# Patient Record
Sex: Male | Born: 1940 | Race: White | Hispanic: No | Smoking: Former smoker
Health system: Southern US, Community
[De-identification: ages and names within clinical notes are randomized; demographics above are authoritative.]

## PROBLEM LIST (undated history)

## (undated) DIAGNOSIS — I10 Essential (primary) hypertension: Secondary | ICD-10-CM

## (undated) DIAGNOSIS — K219 Gastro-esophageal reflux disease without esophagitis: Secondary | ICD-10-CM

## (undated) DIAGNOSIS — I639 Cerebral infarction, unspecified: Secondary | ICD-10-CM

## (undated) DIAGNOSIS — G8191 Hemiplegia, unspecified affecting right dominant side: Secondary | ICD-10-CM

## (undated) DIAGNOSIS — E785 Hyperlipidemia, unspecified: Secondary | ICD-10-CM

## (undated) DIAGNOSIS — E119 Type 2 diabetes mellitus without complications: Secondary | ICD-10-CM

## (undated) DIAGNOSIS — G20 Parkinson's disease: Secondary | ICD-10-CM

## (undated) DIAGNOSIS — G20A1 Parkinson's disease without dyskinesia, without mention of fluctuations: Secondary | ICD-10-CM

## (undated) DIAGNOSIS — Z789 Other specified health status: Secondary | ICD-10-CM

## (undated) HISTORY — DX: Cerebral infarction, unspecified: I63.9

## (undated) HISTORY — DX: Essential (primary) hypertension: I10

## (undated) HISTORY — PX: NO PAST SURGERIES: SHX2092

## (undated) HISTORY — PX: APPENDECTOMY: SHX54

## (undated) HISTORY — DX: Type 2 diabetes mellitus without complications: E11.9

## (undated) HISTORY — DX: Gastro-esophageal reflux disease without esophagitis: K21.9

## (undated) HISTORY — DX: Hyperlipidemia, unspecified: E78.5

---

## 1998-08-25 ENCOUNTER — Encounter: Payer: Self-pay | Admitting: Emergency Medicine

## 1998-08-25 ENCOUNTER — Emergency Department (HOSPITAL_COMMUNITY): Admission: EM | Admit: 1998-08-25 | Discharge: 1998-08-25 | Payer: Self-pay | Admitting: Emergency Medicine

## 2000-07-25 ENCOUNTER — Emergency Department (HOSPITAL_COMMUNITY): Admission: EM | Admit: 2000-07-25 | Discharge: 2000-07-25 | Payer: Self-pay | Admitting: Emergency Medicine

## 2000-07-25 ENCOUNTER — Encounter: Payer: Self-pay | Admitting: Emergency Medicine

## 2003-07-13 ENCOUNTER — Inpatient Hospital Stay (HOSPITAL_COMMUNITY): Admission: EM | Admit: 2003-07-13 | Discharge: 2003-07-21 | Payer: Self-pay | Admitting: Psychiatry

## 2003-07-16 ENCOUNTER — Encounter: Payer: Self-pay | Admitting: Psychiatry

## 2003-09-28 ENCOUNTER — Encounter: Payer: Self-pay | Admitting: Emergency Medicine

## 2003-09-28 ENCOUNTER — Emergency Department (HOSPITAL_COMMUNITY): Admission: EM | Admit: 2003-09-28 | Discharge: 2003-09-28 | Payer: Self-pay | Admitting: *Deleted

## 2005-11-26 ENCOUNTER — Emergency Department (HOSPITAL_COMMUNITY): Admission: EM | Admit: 2005-11-26 | Discharge: 2005-11-26 | Payer: Self-pay | Admitting: *Deleted

## 2006-04-12 ENCOUNTER — Emergency Department (HOSPITAL_COMMUNITY): Admission: EM | Admit: 2006-04-12 | Discharge: 2006-04-12 | Payer: Self-pay | Admitting: *Deleted

## 2006-10-17 ENCOUNTER — Emergency Department (HOSPITAL_COMMUNITY): Admission: EM | Admit: 2006-10-17 | Discharge: 2006-10-17 | Payer: Self-pay | Admitting: Emergency Medicine

## 2006-10-25 ENCOUNTER — Inpatient Hospital Stay (HOSPITAL_COMMUNITY): Admission: AD | Admit: 2006-10-25 | Discharge: 2006-10-30 | Payer: Self-pay | Admitting: *Deleted

## 2006-10-25 ENCOUNTER — Ambulatory Visit: Payer: Self-pay | Admitting: *Deleted

## 2006-10-25 ENCOUNTER — Encounter: Payer: Self-pay | Admitting: Emergency Medicine

## 2008-05-26 ENCOUNTER — Emergency Department (HOSPITAL_COMMUNITY): Admission: EM | Admit: 2008-05-26 | Discharge: 2008-05-26 | Payer: Self-pay | Admitting: Emergency Medicine

## 2009-02-19 ENCOUNTER — Emergency Department (HOSPITAL_COMMUNITY): Admission: EM | Admit: 2009-02-19 | Discharge: 2009-02-19 | Payer: Self-pay | Admitting: Emergency Medicine

## 2009-12-14 ENCOUNTER — Observation Stay (HOSPITAL_COMMUNITY): Admission: EM | Admit: 2009-12-14 | Discharge: 2009-12-17 | Payer: Self-pay | Admitting: Emergency Medicine

## 2009-12-15 ENCOUNTER — Ambulatory Visit: Payer: Self-pay | Admitting: Infectious Diseases

## 2011-02-20 LAB — URINALYSIS, ROUTINE W REFLEX MICROSCOPIC
Hgb urine dipstick: NEGATIVE
Nitrite: NEGATIVE
Protein, ur: 30 mg/dL — AB
Specific Gravity, Urine: 1.02 (ref 1.005–1.030)
Urobilinogen, UA: 0.2 mg/dL (ref 0.0–1.0)

## 2011-02-20 LAB — URINE MICROSCOPIC-ADD ON

## 2011-02-20 LAB — CULTURE, BLOOD (ROUTINE X 2): Culture: NO GROWTH

## 2011-02-20 LAB — BASIC METABOLIC PANEL
Chloride: 104 mEq/L (ref 96–112)
Chloride: 108 mEq/L (ref 96–112)
Creatinine, Ser: 1.09 mg/dL (ref 0.4–1.5)
Creatinine, Ser: 1.24 mg/dL (ref 0.4–1.5)
GFR calc Af Amer: >60 mL/min (ref >60)
GFR calc Af Amer: >60 mL/min (ref >60)
GFR calc non Af Amer: >60 mL/min (ref >60)
Potassium: 3.9 mEq/L (ref 3.5–5.1)
Potassium: 4 mEq/L (ref 3.5–5.1)

## 2011-02-20 LAB — TROPONIN I: Troponin I: 0.03 ng/mL (ref 0.00–0.06)

## 2011-02-20 LAB — CARDIAC PANEL(CRET KIN+CKTOT+MB+TROPI)
Relative Index: INVALID (ref 0.0–2.5)
Relative Index: INVALID (ref 0.0–2.5)
Total CK: 47 U/L (ref 7–232)
Troponin I: <0.01 ng/mL (ref 0.00–0.06)

## 2011-02-20 LAB — DIFFERENTIAL
Basophils Absolute: 0 10*3/uL (ref 0.0–0.1)
Basophils Absolute: 0 10*3/uL (ref 0.0–0.1)
Basophils Relative: 0 % (ref 0–1)
Basophils Relative: 0 % (ref 0–1)
Eosinophils Absolute: 0 10*3/uL (ref 0.0–0.7)
Eosinophils Absolute: 0.1 10*3/uL (ref 0.0–0.7)
Lymphocytes Relative: 5 % — ABNORMAL LOW (ref 12–46)
Lymphs Abs: 0.8 10*3/uL (ref 0.7–4.0)
Monocytes Relative: 2 % — ABNORMAL LOW (ref 3–12)
Monocytes Relative: 4 % (ref 3–12)
Neutro Abs: 9.9 10*3/uL — ABNORMAL HIGH (ref 1.7–7.7)
Neutrophils Relative %: 77 % (ref 43–77)
Neutrophils Relative %: 88 % — ABNORMAL HIGH (ref 43–77)
Neutrophils Relative %: 92 % — ABNORMAL HIGH (ref 43–77)

## 2011-02-20 LAB — HEPATIC FUNCTION PANEL
ALT: 35 U/L (ref 0–53)
AST: 29 U/L (ref 0–37)
Albumin: 3.9 g/dL (ref 3.5–5.2)
Alkaline Phosphatase: 67 U/L (ref 39–117)
Total Bilirubin: 0.6 mg/dL (ref 0.3–1.2)

## 2011-02-20 LAB — TRICYCLICS SCREEN, URINE: TCA Scrn: NOT DETECTED

## 2011-02-20 LAB — HIV ANTIBODY (ROUTINE TESTING W REFLEX): HIV: NONREACTIVE

## 2011-02-20 LAB — CBC
MCHC: 34.5 g/dL (ref 30.0–36.0)
MCHC: 34.6 g/dL (ref 30.0–36.0)
MCV: 93.1 fL (ref 78.0–100.0)
MCV: 93.2 fL (ref 78.0–100.0)
Platelets: 153 10*3/uL (ref 150–400)
RBC: 4.18 MIL/uL — ABNORMAL LOW (ref 4.22–5.81)
RBC: 4.95 MIL/uL (ref 4.22–5.81)
RDW: 12.7 % (ref 11.5–15.5)
RDW: 13 % (ref 11.5–15.5)
WBC: 10.3 10*3/uL (ref 4.0–10.5)
WBC: 10.3 10*3/uL (ref 4.0–10.5)

## 2011-02-20 LAB — RAPID URINE DRUG SCREEN, HOSP PERFORMED
Cocaine: NOT DETECTED
Tetrahydrocannabinol: NOT DETECTED

## 2011-02-20 LAB — POCT I-STAT, CHEM 8
Chloride: 102 mEq/L (ref 96–112)
HCT: 45 % (ref 39.0–52.0)
Potassium: 3.9 mEq/L (ref 3.5–5.1)

## 2011-02-20 LAB — STOOL CULTURE

## 2011-02-20 LAB — ETHANOL: Alcohol, Ethyl (B): <5 mg/dL (ref 0–10)

## 2011-02-20 LAB — CK TOTAL AND CKMB (NOT AT ARMC): Relative Index: INVALID (ref 0.0–2.5)

## 2011-02-20 LAB — CLOSTRIDIUM DIFFICILE EIA
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
## 2011-09-01 LAB — URINALYSIS, ROUTINE W REFLEX MICROSCOPIC
Glucose, UA: NEGATIVE
Nitrite: NEGATIVE
Protein, ur: NEGATIVE
pH: 5.5

## 2011-11-07 ENCOUNTER — Emergency Department (HOSPITAL_COMMUNITY)
Admission: EM | Admit: 2011-11-07 | Discharge: 2011-11-07 | Disposition: A | Discharge status: Home / Self Care | Payer: Medicare Other

## 2011-12-31 ENCOUNTER — Ambulatory Visit (INDEPENDENT_AMBULATORY_CARE_PROVIDER_SITE_OTHER): Payer: Medicare Other

## 2011-12-31 DIAGNOSIS — J019 Acute sinusitis, unspecified: Secondary | ICD-10-CM

## 2012-06-21 ENCOUNTER — Encounter (HOSPITAL_COMMUNITY): Payer: Self-pay | Admitting: Anesthesiology

## 2012-06-21 ENCOUNTER — Emergency Department (HOSPITAL_COMMUNITY): Payer: Medicare Other

## 2012-06-21 ENCOUNTER — Inpatient Hospital Stay (HOSPITAL_COMMUNITY)
Admission: EM | Admit: 2012-06-21 | Discharge: 2012-06-23 | DRG: 343 | Disposition: A | Discharge status: Home / Self Care | Payer: Medicare Other | Attending: Surgery | Admitting: Surgery

## 2012-06-21 ENCOUNTER — Emergency Department (HOSPITAL_COMMUNITY): Payer: Medicare Other | Admitting: Anesthesiology

## 2012-06-21 ENCOUNTER — Encounter (HOSPITAL_COMMUNITY): Payer: Self-pay | Admitting: *Deleted

## 2012-06-21 ENCOUNTER — Encounter (HOSPITAL_COMMUNITY)
Admission: EM | Disposition: A | Discharge status: Home / Self Care | Payer: Self-pay | Source: Home / Self Care | Attending: Surgery

## 2012-06-21 DIAGNOSIS — K37 Unspecified appendicitis: Secondary | ICD-10-CM

## 2012-06-21 DIAGNOSIS — K3580 Unspecified acute appendicitis: Secondary | ICD-10-CM | POA: Diagnosis not present

## 2012-06-21 DIAGNOSIS — N289 Disorder of kidney and ureter, unspecified: Secondary | ICD-10-CM

## 2012-06-21 DIAGNOSIS — N200 Calculus of kidney: Secondary | ICD-10-CM | POA: Diagnosis not present

## 2012-06-21 DIAGNOSIS — Z01811 Encounter for preprocedural respiratory examination: Secondary | ICD-10-CM | POA: Diagnosis not present

## 2012-06-21 DIAGNOSIS — R109 Unspecified abdominal pain: Secondary | ICD-10-CM | POA: Diagnosis not present

## 2012-06-21 DIAGNOSIS — R3911 Hesitancy of micturition: Secondary | ICD-10-CM | POA: Diagnosis not present

## 2012-06-21 DIAGNOSIS — R1031 Right lower quadrant pain: Secondary | ICD-10-CM | POA: Diagnosis not present

## 2012-06-21 DIAGNOSIS — R110 Nausea: Secondary | ICD-10-CM | POA: Diagnosis not present

## 2012-06-21 HISTORY — PX: LAPAROSCOPIC APPENDECTOMY: SHX408

## 2012-06-21 HISTORY — DX: Other specified health status: Z78.9

## 2012-06-21 LAB — URINALYSIS, ROUTINE W REFLEX MICROSCOPIC
Bilirubin Urine: NEGATIVE
Leukocytes, UA: NEGATIVE
Nitrite: NEGATIVE
Specific Gravity, Urine: 1.033 — ABNORMAL HIGH (ref 1.005–1.030)
Urobilinogen, UA: 0.2 mg/dL (ref 0.0–1.0)
pH: 8 (ref 5.0–8.0)

## 2012-06-21 LAB — URINE MICROSCOPIC-ADD ON

## 2012-06-21 LAB — BASIC METABOLIC PANEL
BUN: 15 mg/dL (ref 6–23)
Chloride: 101 mEq/L (ref 96–112)
GFR calc Af Amer: 83 mL/min — ABNORMAL LOW (ref >90)
GFR calc non Af Amer: 72 mL/min — ABNORMAL LOW (ref >90)
Potassium: 3.6 mEq/L (ref 3.5–5.1)
Sodium: 138 mEq/L (ref 135–145)

## 2012-06-21 LAB — CBC
HCT: 42.2 % (ref 39.0–52.0)
RDW: 12.9 % (ref 11.5–15.5)
WBC: 13.3 10*3/uL — ABNORMAL HIGH (ref 4.0–10.5)

## 2012-06-21 SURGERY — APPENDECTOMY, LAPAROSCOPIC
Anesthesia: General | Site: Abdomen | Wound class: Contaminated

## 2012-06-21 MED ORDER — SODIUM CHLORIDE 0.9 % IV SOLN
3.0000 g | Freq: Once | INTRAVENOUS | Status: AC
  Administered 2012-06-21: 3 g via INTRAVENOUS
  Filled 2012-06-21: qty 3

## 2012-06-21 MED ORDER — BUPIVACAINE HCL (PF) 0.25 % IJ SOLN
INTRAMUSCULAR | Status: AC
  Filled 2012-06-21: qty 30

## 2012-06-21 MED ORDER — HYDROMORPHONE HCL PF 1 MG/ML IJ SOLN
1.0000 mg | Freq: Once | INTRAMUSCULAR | Status: AC
  Administered 2012-06-21: 1 mg via INTRAVENOUS
  Filled 2012-06-21: qty 1

## 2012-06-21 MED ORDER — DEXTROSE 5 % IV SOLN
2.0000 g | Freq: Four times a day (QID) | INTRAVENOUS | Status: DC
  Administered 2012-06-22 – 2012-06-23 (×6): 2 g via INTRAVENOUS
  Filled 2012-06-21 (×8): qty 2

## 2012-06-21 MED ORDER — DEXAMETHASONE SODIUM PHOSPHATE 10 MG/ML IJ SOLN
INTRAMUSCULAR | Status: DC | PRN
  Administered 2012-06-21: 10 mg via INTRAVENOUS

## 2012-06-21 MED ORDER — FENTANYL CITRATE 0.05 MG/ML IJ SOLN
INTRAMUSCULAR | Status: DC | PRN
  Administered 2012-06-21 (×3): 50 ug via INTRAVENOUS

## 2012-06-21 MED ORDER — SODIUM CHLORIDE 0.9 % IV SOLN
3.0000 g | INTRAVENOUS | Status: DC | PRN
  Administered 2012-06-21: 3 g via INTRAVENOUS

## 2012-06-21 MED ORDER — FENTANYL CITRATE 0.05 MG/ML IJ SOLN: 25.0000 ug | INTRAMUSCULAR | Status: DC | PRN

## 2012-06-21 MED ORDER — HEPARIN SODIUM (PORCINE) 5000 UNIT/ML IJ SOLN
5000.0000 [IU] | Freq: Three times a day (TID) | INTRAMUSCULAR | Status: DC
  Administered 2012-06-22 – 2012-06-23 (×4): 5000 [IU] via SUBCUTANEOUS
  Filled 2012-06-21 (×8): qty 1

## 2012-06-21 MED ORDER — PROPOFOL 10 MG/ML IV EMUL
INTRAVENOUS | Status: DC | PRN
  Administered 2012-06-21: 200 mg via INTRAVENOUS

## 2012-06-21 MED ORDER — PROMETHAZINE HCL 25 MG/ML IJ SOLN: 6.2500 mg | INTRAMUSCULAR | Status: DC | PRN

## 2012-06-21 MED ORDER — LACTATED RINGERS IV SOLN
INTRAVENOUS | Status: DC | PRN
  Administered 2012-06-21 (×2): via INTRAVENOUS

## 2012-06-21 MED ORDER — KCL IN DEXTROSE-NACL 20-5-0.45 MEQ/L-%-% IV SOLN
INTRAVENOUS | Status: DC
  Administered 2012-06-21: 22:00:00 via INTRAVENOUS
  Administered 2012-06-22 (×2): 125 mL/h via INTRAVENOUS
  Filled 2012-06-21 (×7): qty 1000

## 2012-06-21 MED ORDER — KETOROLAC TROMETHAMINE 30 MG/ML IJ SOLN: 15.0000 mg | Freq: Once | INTRAMUSCULAR | Status: DC | PRN

## 2012-06-21 MED ORDER — ONDANSETRON HCL 4 MG/2ML IJ SOLN: 4.0000 mg | Freq: Four times a day (QID) | INTRAMUSCULAR | Status: DC | PRN

## 2012-06-21 MED ORDER — BUPIVACAINE HCL (PF) 0.25 % IJ SOLN
INTRAMUSCULAR | Status: DC | PRN
  Administered 2012-06-21: 30 mL

## 2012-06-21 MED ORDER — ONDANSETRON HCL 4 MG PO TABS: 4.0000 mg | ORAL_TABLET | Freq: Four times a day (QID) | ORAL | Status: DC | PRN

## 2012-06-21 MED ORDER — GLYCOPYRROLATE 0.2 MG/ML IJ SOLN
INTRAMUSCULAR | Status: DC | PRN
  Administered 2012-06-21 (×2): 0.2 mg via INTRAVENOUS

## 2012-06-21 MED ORDER — IOHEXOL 300 MG/ML  SOLN
100.0000 mL | Freq: Once | INTRAMUSCULAR | Status: AC | PRN
  Administered 2012-06-21: 100 mL via INTRAVENOUS

## 2012-06-21 MED ORDER — ONDANSETRON HCL 4 MG/2ML IJ SOLN
INTRAMUSCULAR | Status: DC | PRN
  Administered 2012-06-21 (×2): 2 mg via INTRAVENOUS

## 2012-06-21 MED ORDER — MIDAZOLAM HCL 5 MG/5ML IJ SOLN
INTRAMUSCULAR | Status: DC | PRN
  Administered 2012-06-21: 1 mg via INTRAVENOUS
  Administered 2012-06-21: 0.5 mg via INTRAVENOUS

## 2012-06-21 MED ORDER — CISATRACURIUM BESYLATE (PF) 10 MG/5ML IV SOLN
INTRAVENOUS | Status: DC | PRN
  Administered 2012-06-21: 6 mg via INTRAVENOUS

## 2012-06-21 MED ORDER — SUCCINYLCHOLINE CHLORIDE 20 MG/ML IJ SOLN
INTRAMUSCULAR | Status: DC | PRN
  Administered 2012-06-21: 100 mg via INTRAVENOUS

## 2012-06-21 MED ORDER — ACETAMINOPHEN 10 MG/ML IV SOLN
INTRAVENOUS | Status: DC | PRN
  Administered 2012-06-21: 1000 mg via INTRAVENOUS

## 2012-06-21 MED ORDER — HYDROCODONE-ACETAMINOPHEN 5-325 MG PO TABS
1.0000 | ORAL_TABLET | ORAL | Status: DC | PRN
  Administered 2012-06-22: 1 via ORAL
  Filled 2012-06-21: qty 1

## 2012-06-21 MED ORDER — NEOSTIGMINE METHYLSULFATE 1 MG/ML IJ SOLN
INTRAMUSCULAR | Status: DC | PRN
  Administered 2012-06-21: 1 mg via INTRAVENOUS

## 2012-06-21 MED ORDER — ACETAMINOPHEN 325 MG PO TABS: 650.0000 mg | ORAL_TABLET | ORAL | Status: DC | PRN

## 2012-06-21 MED ORDER — LIDOCAINE HCL (CARDIAC) 20 MG/ML IV SOLN
INTRAVENOUS | Status: DC | PRN
  Administered 2012-06-21: 75 mg via INTRAVENOUS

## 2012-06-21 MED ORDER — SODIUM CHLORIDE 0.9 % IV SOLN
Freq: Once | INTRAVENOUS | Status: AC
  Administered 2012-06-21: 14:00:00 via INTRAVENOUS

## 2012-06-21 MED ORDER — MORPHINE SULFATE 2 MG/ML IJ SOLN: 1.0000 mg | INTRAMUSCULAR | Status: DC | PRN

## 2012-06-21 SURGICAL SUPPLY — 36 items
APPLIER CLIP ROT 10 11.4 M/L (STAPLE)
BENZOIN TINCTURE PRP APPL 2/3 (GAUZE/BANDAGES/DRESSINGS) ×2 IMPLANT
CANISTER SUCTION 2500CC (MISCELLANEOUS) ×2 IMPLANT
CLIP APPLIE ROT 10 11.4 M/L (STAPLE) IMPLANT
CLOTH BEACON ORANGE TIMEOUT ST (SAFETY) ×2 IMPLANT
CUTTER FLEX LINEAR 45M (STAPLE) ×2 IMPLANT
DECANTER SPIKE VIAL GLASS SM (MISCELLANEOUS) ×2 IMPLANT
DERMABOND ADVANCED (GAUZE/BANDAGES/DRESSINGS) ×1
DERMABOND ADVANCED .7 DNX12 (GAUZE/BANDAGES/DRESSINGS) ×1 IMPLANT
DRAPE LAPAROSCOPIC ABDOMINAL (DRAPES) ×2 IMPLANT
ELECT REM PT RETURN 9FT ADLT (ELECTROSURGICAL) ×2
ELECTRODE REM PT RTRN 9FT ADLT (ELECTROSURGICAL) ×1 IMPLANT
ENDOLOOP SUT PDS II  0 18 (SUTURE)
ENDOLOOP SUT PDS II 0 18 (SUTURE) IMPLANT
GLOVE BIOGEL PI IND STRL 7.0 (GLOVE) ×1 IMPLANT
GLOVE BIOGEL PI INDICATOR 7.0 (GLOVE) ×1
GLOVE SURG SIGNA 7.5 PF LTX (GLOVE) ×2 IMPLANT
GOWN STRL NON-REIN LRG LVL3 (GOWN DISPOSABLE) ×2 IMPLANT
GOWN STRL REIN XL XLG (GOWN DISPOSABLE) ×4 IMPLANT
KIT BASIN OR (CUSTOM PROCEDURE TRAY) ×2 IMPLANT
PENCIL BUTTON HOLSTER BLD 10FT (ELECTRODE) IMPLANT
POUCH SPECIMEN RETRIEVAL 10MM (ENDOMECHANICALS) ×2 IMPLANT
RELOAD 45 VASCULAR/THIN (ENDOMECHANICALS) IMPLANT
RELOAD STAPLE TA45 3.5 REG BLU (ENDOMECHANICALS) ×2 IMPLANT
SCALPEL HARMONIC ACE (MISCELLANEOUS) ×2 IMPLANT
SET IRRIG TUBING LAPAROSCOPIC (IRRIGATION / IRRIGATOR) ×2 IMPLANT
SOLUTION ANTI FOG 6CC (MISCELLANEOUS) ×2 IMPLANT
STRIP CLOSURE SKIN 1/4X3 (GAUZE/BANDAGES/DRESSINGS) ×2 IMPLANT
SUT MON AB 5-0 PS2 18 (SUTURE) ×2 IMPLANT
TOWEL OR 17X26 10 PK STRL BLUE (TOWEL DISPOSABLE) ×2 IMPLANT
TRAY FOLEY CATH 14FRSI W/METER (CATHETERS) ×2 IMPLANT
TRAY LAP CHOLE (CUSTOM PROCEDURE TRAY) ×2 IMPLANT
TROCAR XCEL BLUNT TIP 100MML (ENDOMECHANICALS) ×2 IMPLANT
TROCAR Z-THREAD FIOS 11X100 BL (TROCAR) ×2 IMPLANT
TROCAR Z-THREAD FIOS 5X100MM (TROCAR) ×2 IMPLANT
TUBING INSUFFLATION 10FT LAP (TUBING) ×2 IMPLANT

## 2012-06-21 NOTE — Anesthesia Postprocedure Evaluation (Signed)
  Anesthesia Post-op Note  Patient: Matthew Meza  Procedure(s) Performed: Procedure(s) (LRB): APPENDECTOMY LAPAROSCOPIC (N/A)  Patient Location: PACU  Anesthesia Type: General  Level of Consciousness: awake and alert   Airway and Oxygen Therapy: Patient Spontanous Breathing  Post-op Pain: mild  Post-op Assessment: Post-op Vital signs reviewed, Patient's Cardiovascular Status Stable, Respiratory Function Stable, Patent Airway and No signs of Nausea or vomiting  Post-op Vital Signs: stable  Complications: No apparent anesthesia complications

## 2012-06-21 NOTE — ED Notes (Signed)
Patient transported to CT 

## 2012-06-21 NOTE — ED Notes (Signed)
RLQ pain started this am after breakfast, last BM this am. Has drank 1/2 bottle citrate of mag.

## 2012-06-21 NOTE — Transfer of Care (Signed)
Immediate Anesthesia Transfer of Care Note  Patient: Matthew Meza  Procedure(s) Performed: Procedure(s) (LRB): APPENDECTOMY LAPAROSCOPIC (N/A)  Patient Location: PACU  Anesthesia Type: General  Level of Consciousness: awake, oriented, patient cooperative, lethargic and responds to stimulation  Airway & Oxygen Therapy: Patient Spontanous Breathing and Patient connected to face mask oxygen  Post-op Assessment: Report given to PACU RN, Post -op Vital signs reviewed and stable and Patient moving all extremities  Post vital signs: Reviewed and stable  Complications: No apparent anesthesia complications

## 2012-06-21 NOTE — Anesthesia Preprocedure Evaluation (Signed)
Anesthesia Evaluation  Patient identified by MRN, date of birth, ID band Patient awake    Reviewed: Allergy & Precautions, H&P , NPO status , Patient's Chart, lab work & pertinent test results  Airway Mallampati: II TM Distance: >3 FB Neck ROM: Full    Dental No notable dental hx.    Pulmonary neg pulmonary ROS,  breath sounds clear to auscultation  Pulmonary exam normal       Cardiovascular negative cardio ROS  Rhythm:Regular Rate:Normal     Neuro/Psych negative neurological ROS  negative psych ROS   GI/Hepatic negative GI ROS, Neg liver ROS,   Endo/Other  negative endocrine ROS  Renal/GU negative Renal ROS  negative genitourinary   Musculoskeletal negative musculoskeletal ROS (+)   Abdominal   Peds negative pediatric ROS (+)  Hematology negative hematology ROS (+)   Anesthesia Other Findings   Reproductive/Obstetrics negative OB ROS                           Anesthesia Physical Anesthesia Plan  ASA: I and Emergent  Anesthesia Plan: General   Post-op Pain Management:    Induction: Intravenous and Rapid sequence  Airway Management Planned: Oral ETT  Additional Equipment:   Intra-op Plan:   Post-operative Plan: Extubation in OR  Informed Consent: I have reviewed the patients History and Physical, chart, labs and discussed the procedure including the risks, benefits and alternatives for the proposed anesthesia with the patient or authorized representative who has indicated his/her understanding and acceptance.   Dental advisory given  Plan Discussed with: CRNA and Surgeon  Anesthesia Plan Comments:         Anesthesia Quick Evaluation  

## 2012-06-21 NOTE — ED Provider Notes (Addendum)
Patient signed out to me by Dr. Patria Mane.  Patient had abdominal pain in the right lower quadrant it began this morning.  Patient had associated nausea and difficulty with bowel movements today.  He had a CT pending at time of turnover and it does demonstrate appendicitis.  Patient will be given a dose of Unasyn and I will consult general surgery for evaluation of this patient.  Secondarily the patient was noted to have a renal lesion that needs further evaluation to rule out renal cell carcinoma.  I did discuss this with the patient to make them aware that he will require further evaluation with MRI for this lesion and he understands this.    Nat Christen, MD 06/21/12 1700  5:06 PM Spoke with Dr. Ezzard Standing from [facility] and he will see the pt.    Nat Christen, MD 06/21/12 [phone]

## 2012-06-21 NOTE — ED Notes (Signed)
MD at bedside. Dr. Ezzard Standing at bedside.

## 2012-06-21 NOTE — Brief Op Note (Signed)
06/21/2012  8:56 PM  PATIENT:  Matthew Meza, 71 y.o., male, MRN: [id number]  PREOP DIAGNOSIS:  appendicitis  POSTOP DIAGNOSIS:   Acute suppurative appendicitis  PROCEDURE:   Procedure(s): APPENDECTOMY LAPAROSCOPIC  SURGEON:   Ovidio Kin, M.D.  ASSISTANT:   None  ANESTHESIA:   general  Edison Pace - CRNA Eilene Ghazi, MD - Anesthesiologist  General  EBL:  Minimal  ml  BLOOD ADMINISTERED: none  DRAINS: none   LOCAL MEDICATIONS USED:   30 cc 1/4% marcaine  SPECIMEN:   Appendix  COUNTS CORRECT:  YES  INDICATIONS FOR PROCEDURE:  Matthew Meza is a 71 y.o. (DOB: [birth year]) white male whose primary care physician is No primary provider on file. and comes through the [facility] for lap appendectomy.   The indications and risks of the surgery were explained to the patient.  The risks include, but are not limited to, infection, bleeding, and nerve injury.  Note dictated to:   #[id number]

## 2012-06-21 NOTE — ED Provider Notes (Addendum)
History     CSN: [id number]  Arrival date & time 06/21/12  1319   First MD Initiated Contact with Patient 06/21/12 1341      Chief Complaint  Patient presents with  . rlq pain   . Abdominal Pain    (Consider location/radiation/quality/duration/timing/severity/associated sxs/prior treatment) The history is provided by the patient.   the patient reports gradual worsening of right lower quadrant abdominal pain without radiation today.  His had nausea without vomiting.  Denies diarrhea.  His pain is constant.  His pain is worsened by palpation and movement.  His pain is moderate in severity.  Denies urinary symptoms.  He has no flank pain.  Never had pain like this before.  He's never had abdominal surgery before.   History reviewed. No pertinent past medical history.  History reviewed. No pertinent past surgical history.  History reviewed. No pertinent family history.  History  Substance Use Topics  . Smoking status: Former Games developer  . Smokeless tobacco: Never Used  . Alcohol Use: Yes      Review of Systems  Gastrointestinal: Positive for abdominal pain.  All other systems reviewed and are negative.    Allergies  Review of patient's allergies indicates no known allergies.  Home Medications   Current Outpatient Rx  Name Route Sig Dispense Refill  . MAGNESIUM CITRATE 1.745 GM/30ML PO SOLN Oral Take 1 Bottle by mouth once.      BP 160/93  Pulse 105  Temp 99.6 F (37.6 C) (Oral)  Resp 16  SpO2 95%  Physical Exam  Nursing note and vitals reviewed. Constitutional: He is oriented to person, place, and time. He appears well-developed and well-nourished.  HENT:  Head: Normocephalic and atraumatic.  Eyes: EOM are normal.  Neck: Normal range of motion.  Cardiovascular: Normal rate, regular rhythm, normal heart sounds and intact distal pulses.   Pulmonary/Chest: Effort normal and breath sounds normal. No respiratory distress.  Abdominal: Soft. He exhibits no  distension.       Tenderness in his right lower quadrant with guarding   Musculoskeletal: Normal range of motion.  Neurological: He is alert and oriented to person, place, and time.  Skin: Skin is warm and dry.  Psychiatric: He has a normal mood and affect. Judgment normal.    ED Course  Procedures (including critical care time)   Labs Reviewed  CBC  BASIC METABOLIC PANEL  URINALYSIS, ROUTINE W REFLEX MICROSCOPIC   No results found.   No diagnosis found.    MDM  His presentation is concerning for acute appendicitis.  CT scan pending of his abdomen to evaluate for appendicitis or alternative pathology such as right-sided diverticulitis.  NPO at this time.  Pain treated.        Lyanne Co, MD 06/21/12 1353   Date: 07/16/2012  Rate: 101  Rhythm: normal sinus rhythm  QRS Axis: normal  Intervals: normal  ST/T Wave abnormalities: normal  Conduction Disutrbances: none  Narrative Interpretation:   Old EKG Reviewed: no prior ecg available      Lyanne Co, MD 07/16/12 [phone]

## 2012-06-21 NOTE — ED Notes (Signed)
Pt states woke up this morning w/ a RLQ pain, states had a small BM this morning that was hard, then drink half bottle of magnesium citrate, then had another BM that was normal, pt does still have his appendix, denies vomiting, states some nausea. Pain 6/10 in RLQ.

## 2012-06-21 NOTE — ED Notes (Signed)
Pt ready for transport to OR.
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
## 2012-06-21 NOTE — Preoperative (Signed)
Beta Blockers   Reason not to administer Beta Blockers:Not Applicable 

## 2012-06-22 ENCOUNTER — Encounter (HOSPITAL_COMMUNITY): Payer: Self-pay | Admitting: Surgery

## 2012-06-22 ENCOUNTER — Inpatient Hospital Stay (HOSPITAL_COMMUNITY): Payer: Medicare Other

## 2012-06-22 NOTE — Progress Notes (Signed)
1 Day Post-Op  Subjective: Feels okay this morning, still feels bloated, but no NV No flatus so far, but + BS.   Objective: Vital signs in last 24 hours: Temp:  [97.6 F (36.4 C)-99.6 F (37.6 C)] 97.8 F (36.6 C) (07/19 0702) Pulse Rate:  [80-105] 86  (07/19 0702) Resp:  [8-18] 18  (07/19 0702) BP: (109-160)/(61-95) 109/61 mmHg (07/19 0702) SpO2:  [94 %-99 %] 96 % (07/19 0702) Weight:  [210 lb 11.2 oz (95.573 kg)] 210 lb 11.2 oz (95.573 kg) (07/18 2211) Last BM Date: 06/22/12  Intake/Output from previous day: 07/18 0701 - 07/19 0700 In: 2341.7 [I.V.:2291.7; IV Piggyback:50] Out: 875 [Urine:875] Intake/Output this shift: Total I/O In: 500 [P.O.:500] Out: 750 [Urine:750]  General appearance: alert and cooperative Chest: CTA Abdomen: slightly bloated, no Flatus, + BS, all surgical wound sites appear to be well approximated, w/o erythema or drainage. Extremities: no edema  Lab Results:   Basename 06/21/12 1400  WBC 13.3*  HGB 15.2  HCT 42.2  PLT 182   BMET  Basename 06/21/12 1400  NA 138  K 3.6  CL 101  CO2 26  GLUCOSE 122*  BUN 15  CREATININE 1.03  CALCIUM 9.1   PT/INR No results found for this basename: LABPROT:2,INR:2 in the last 72 hours ABG No results found for this basename: PHART:2,PCO2:2,PO2:2,HCO3:2 in the last 72 hours  Studies/Results: Dg Chest 1 View  06/21/2012  *RADIOLOGY REPORT*  Clinical Data: Preop.  CHEST - 1 VIEW  Comparison: 12/13/2009.  Findings: Trachea is midline.  Heart size normal.  Lungs are clear. No pleural fluid.  IMPRESSION: No acute findings.  Original Report Authenticated By: Reyes Ivan, M.D.   Ct Abdomen Pelvis W Contrast  06/21/2012  *RADIOLOGY REPORT*  Clinical Data: Worsening right lower quadrant pain, nausea  CT ABDOMEN AND PELVIS WITH CONTRAST  Technique:  Multidetector CT imaging of the abdomen and pelvis was performed following the standard protocol during bolus administration of intravenous contrast.  Contrast:   100 ml Omnipaque-300 IV  Comparison: None.  Findings: Mild dependent atelectasis left lower lobe.  Liver, spleen pancreas, and bilateral adrenal glands are within normal limits.  Gallbladder is unremarkable.  No intrahepatic or extrahepatic ductal dilatation.  The  Heterogeneous 1.6 x 1.7 cm right upper pole lesion with possible enhancement along its inferolateral margin (coronal image 71), Bosniak III.  Multiple probable bilateral renal cysts, the largest measuring 2.8 x 2.9 cm in the left upper pole.  4 mm nonobstructing interpolar left renal calculus (series 2/image 45).  2 mm nonobstructing right lower pole renal calculus (series 2/image 55).  No hydronephrosis.  No evidence of bowel obstruction.  Dilated appendix, measuring up to 13 mm, with the surrounding periappendiceal inflammatory changes, compatible with acute appendicitis.  No drainable fluid collection or abscess.  No free air.  Atherosclerotic calcifications of the abdominal aorta and branch vessels.  No abdominopelvic ascites.  No suspicious abdominopelvic lymphadenopathy.  Prostatomegaly, measuring 5.7 cm in transverse dimension.  Bladder is within normal limits.  Degenerative changes of the visualized thoracolumbar spine.  IMPRESSION: Suspected acute appendicitis.  No drainable fluid collection or abscess.  No free air.  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  MRI abdomen with/without contrast is suggested for further characterization on a nonemergent basis.  Small nonobstructing bilateral renal calculi measuring up to 4 mm. No hydronephrosis.  These results were called by telephone on 06/21/2012 at 1642 hours to Dr Golda Acre, who verbally acknowledged these results.  Original  Report Authenticated By: Charline Bills, M.D.    Anti-infectives: Anti-infectives     Start     Dose/Rate Route Frequency Ordered Stop   06/22/12 0200   cefOXitin (MEFOXIN) 2 g in dextrose 5 % 50 mL IVPB        2 g 100 mL/hr over 30 Minutes Intravenous Every  6 hours 06/21/12 2159     06/21/12 1700   Ampicillin-Sulbactam (UNASYN) 3 g in sodium chloride 0.9 % 100 mL IVPB        3 g 100 mL/hr over 60 Minutes Intravenous  Once 06/21/12 1651 06/21/12 1758          Assessment/Plan: s/p Procedure(s) (LRB): APPENDECTOMY LAPAROSCOPIC (N/A) 1. Will repeat MRI with contrast today as per radiology's recommendation. 2. Will keep NPO for now.  LOS: 1 day    Athol Bolds 06/22/2012
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
## 2012-06-22 NOTE — Progress Notes (Signed)
Patient is refusing his MRI of the abdomen, Blenda Mounts, NP, notified.  Patient states due to his legal history of having things done to him regarding his life, also does not want any information shared with his daughter or other family members.  He also is requesting a diet.  Procedure cancelled and can be on clear liquids .

## 2012-06-22 NOTE — Op Note (Signed)
NAMEBRAELYNN, Matthew Meza NO.:  [id number]  MEDICAL RECORD NO.:  [id number]  LOCATION:  1509                         FACILITY:  [facility]  PHYSICIAN:  Sandria Bales. Ezzard Standing, M.D.  DATE OF BIRTH:  03/06/1941  DATE OF PROCEDURE:  06/21/2012                              OPERATIVE REPORT  PREOPERATIVE DIAGNOSIS:  Acute appendicitis.  POSTOPERATIVE DIAGNOSIS:  Acute suppurative appendicitis.  PROCEDURE:  Laparoscopic appendectomy.  SURGEON:  Sandria Bales. Ezzard Standing, MD  FIRST ASSISTANT:  None.  ANESTHESIA:  General endotracheal.  ESTIMATED BLOOD LOSS:  Minimal.  ANESTHESIA:  Local anesthetic with 30 mL of 4% Marcaine.  COMPLICATIONS:  None.  INDICATION FOR PROCEDURE:  Mr. Soderholm is a 71 year old white male who has no primary care physician, who presented to [facility] with acute abdominal pain.  Physical exam, a CT scan was consistent with acute appendicitis.  I discussed with the patient the indications, potential complications of appendectomy.  We will proceed with surgery tonight.  I spoke to a sister, Elvera Maria, on the phone before leaving the ER.  OPERATIVE NOTE:  The patient was taken to room #1 where he underwent a general endotracheal anesthesia, supervised by Dr. Eilene Ghazi.  A time-out was held and surgical checklist run.  He is on Unasyn as an antibiotic.  He had a Foley catheter placed.  His left arm was tucked by the sides.  The right arm was out.  His abdomen was prepped with ChloraPrep and sterilely draped.  I went through an infraumbilical incision with sharp dissection carried down to the abdominal cavity.  I placed a 12-mm Hasson trocar, secured with a 0 Vicryl suture, and I then used a 10-mm laparoscope for exploration, right and left lobes of liver unremarkable.  Anterior wall of the stomach unremarkable.  The bowel that I could see was unremarkable except in the right lower quadrant where he had an inflammatory mass consistent  with acute appendicitis.  I placed a 5-mm trocar in the right upper quadrant, 11-mm in the left lower quadrant, and then approached the appendix.  I took a Harmonic scalpel, divided the mesentery of the appendix and at the base of the appendix, I then used a blue load of the 45-mm Ethicon Endo-GIA stapler and fired this across the base of the appendix.  I placed the appendix in an EndoCatch bag and delivered through the umbilicus.  I then reinspected the staple line which looked good.  I irrigated the right lower quadrant, right pelvic brim with 500 mL of saline.  There was no bleeding.  Staple line looked healthy as did the cecum/right colon.  I then removed the infraumbilical trocar, I closed it with 2-0 Vicryl sutures.  I then infiltrated each and then took each trocar under direct visualization.  I infiltrated the trocar sites with about 30 mL of 4% Marcaine, closed the skin with a 5-0 Monocryl suture, painted the wounds with Dermabond.  The patient tolerated the procedure well, was transported to recovery room in good condition.  Sponge and needle count were correct at the end of the case.  He has no primary care physician.   Sandria Bales. Ezzard Standing, M.D., [facility]   DHN/MEDQ  D:  06/21/2012  T:  06/22/2012  Job:  [id number]

## 2012-06-23 MED ORDER — HYDROCODONE-ACETAMINOPHEN 5-325 MG PO TABS: 1.0000 | ORAL_TABLET | ORAL | Status: AC | PRN

## 2012-06-23 NOTE — Progress Notes (Signed)
He looks remarkably good, and is ambulatory without difficulty.  He has eaten breakfast without problems.  His abd. Incisions (3 sm. Ones) are quite clean and remain covered with Dermabond.  He states he had a b.m. Within the last 12 hours and is voiding without difficulty, although he states he had had some urinary hesitancy yesterday.

## 2012-06-23 NOTE — Discharge Summary (Signed)
Physician Discharge Summary  Patient ID: Matthew Meza MRN: [id number] DOB/AGE: 71/19/1942 71 y.o.  Admit date: 06/21/2012 Discharge date: 06/23/2012  Admission Diagnoses: Acute appendicitis  Discharge Diagnoses:  Acute appendicitis  Discharged Condition: good  Hospital Course:  Pt was admitted to the floor after laparoscopic appendectomy.  He initially had some hesitancy with urination.  He has tolerated ambulation, oral narcotics, and regular diet.  He has had 2 bowel movements already.  Consults: None  Significant Diagnostic Studies: radiology: CT scan: acute appendicitis  Treatments: surgery: lap appy  Discharge Exam: Blood pressure 119/73, pulse 63, temperature 97.7 F (36.5 C), temperature source Oral, resp. rate 18, height 5' 9.5" (1.765 m), weight 210 lb 11.2 oz (95.573 kg), SpO2 98.00%. General appearance: alert, cooperative and no distress Head: Normocephalic, without obvious abnormality, atraumatic GI: soft, slightly distended, approp tender at incisions. Extremities: extremities normal, atraumatic, no cyanosis or edema  Disposition: 01-Home or Self Care  Discharge Orders    Future Orders Please Complete By Expires   Diet - low sodium heart healthy      Increase activity slowly      Lifting restrictions      Comments:   No strenuous activity or heavy lifting until cleared by Dr. Ezzard Standing.   Call MD for:  temperature >100.4      Call MD for:  persistant nausea and vomiting      Call MD for:  severe uncontrolled pain      Call MD for:  redness, tenderness, or signs of infection (pain, swelling, redness, odor or green/yellow discharge around incision site)        Medication List  As of 06/23/2012 12:03 PM   STOP taking these medications         magnesium citrate 1.745 GM/30ML Soln         TAKE these medications         HYDROcodone-acetaminophen 5-325 MG per tablet   Commonly known as: NORCO/VICODIN   Take 1-2 tablets by mouth every 4 (four) hours as  needed.           Follow-up Information    Follow up with [facility], MD. Schedule an appointment as soon as possible for a visit in 2 weeks.   Contact information:   [employer] [address], [address] [place] [address] [phone]          Signed: Almond Lint 06/23/2012, 12:03 PM

## 2012-08-28 DIAGNOSIS — M79609 Pain in unspecified limb: Secondary | ICD-10-CM | POA: Diagnosis not present

## 2012-08-28 DIAGNOSIS — M25579 Pain in unspecified ankle and joints of unspecified foot: Secondary | ICD-10-CM | POA: Diagnosis not present

## 2012-10-19 ENCOUNTER — Ambulatory Visit: Payer: Medicare Other

## 2012-10-30 ENCOUNTER — Ambulatory Visit (INDEPENDENT_AMBULATORY_CARE_PROVIDER_SITE_OTHER): Payer: Medicare Other | Admitting: Family Medicine

## 2012-10-30 ENCOUNTER — Encounter: Payer: Self-pay | Admitting: Family Medicine

## 2012-10-30 VITALS — BP 130/82 | HR 78 | Temp 98.0°F | Resp 16 | Ht 67.0 in | Wt 197.6 lb

## 2012-10-30 DIAGNOSIS — J3489 Other specified disorders of nose and nasal sinuses: Secondary | ICD-10-CM

## 2012-10-30 DIAGNOSIS — J302 Other seasonal allergic rhinitis: Secondary | ICD-10-CM | POA: Insufficient documentation

## 2012-10-30 DIAGNOSIS — R0981 Nasal congestion: Secondary | ICD-10-CM

## 2012-10-30 MED ORDER — FLUTICASONE PROPIONATE 50 MCG/ACT NA SUSP: 2.0000 | Freq: Every day | NASAL | Status: DC

## 2012-10-30 NOTE — Progress Notes (Signed)
S:  The pt is a 71 y.o. Cauc male who is homeless; he lives in "cheap motels" when he can afford it. Otherwise, he lives in his car. He c/o sinus congestion w/ minimal rhinorrhea. He denies fever/ chills, sore throat, ear pain or decreased hearing,  HA or dizziness. He has been using OTC Zicam with some improvement but has used Fluticasone in the past w/ better results. This was prescribed by Dr. Georgiana Shore in the past. He is most symptomatic this time of year.  ROS: As per HPI.  O:  Filed Vitals:   10/30/12 1633  BP: 130/82  Pulse: 78  Temp: 98 F (36.7 C)  Resp: 16   GEN: In NAD; WN/WD. HEENT: [place]/AT; EOMI w/ clear conj/sclerae. NT sinuses. EACs/TMs normal. Post ph w/ minimal erythema and no exudate. NECK: Supple; no LAN or TMG. NEURO: A&O x 3; CNs intact. Nonfocal.  A/P:  1. Sinus congestion      RX: Fluticasone  1 bottle  5 RFs    2 spr each nostril hs.  Pt advised to use spray every night, not just as needed (benefit of use not felt for at least 2 weeks).  Pt declined Flu vaccine; he usually gets immunized but he has decided to forego vaccine this year.

## 2013-02-12 DIAGNOSIS — H40009 Preglaucoma, unspecified, unspecified eye: Secondary | ICD-10-CM | POA: Diagnosis not present

## 2013-04-08 ENCOUNTER — Emergency Department (HOSPITAL_COMMUNITY)
Admission: EM | Admit: 2013-04-08 | Discharge: 2013-04-08 | Disposition: A | Discharge status: Home / Self Care | Payer: Medicare Other | Attending: Emergency Medicine | Admitting: Emergency Medicine

## 2013-04-08 ENCOUNTER — Emergency Department (HOSPITAL_COMMUNITY): Payer: Medicare Other

## 2013-04-08 ENCOUNTER — Encounter (HOSPITAL_COMMUNITY): Payer: Self-pay | Admitting: Nurse Practitioner

## 2013-04-08 DIAGNOSIS — IMO0002 Reserved for concepts with insufficient information to code with codable children: Secondary | ICD-10-CM | POA: Diagnosis not present

## 2013-04-08 DIAGNOSIS — R52 Pain, unspecified: Secondary | ICD-10-CM | POA: Insufficient documentation

## 2013-04-08 DIAGNOSIS — J209 Acute bronchitis, unspecified: Secondary | ICD-10-CM | POA: Insufficient documentation

## 2013-04-08 DIAGNOSIS — Z87891 Personal history of nicotine dependence: Secondary | ICD-10-CM | POA: Diagnosis not present

## 2013-04-08 DIAGNOSIS — J208 Acute bronchitis due to other specified organisms: Secondary | ICD-10-CM

## 2013-04-08 DIAGNOSIS — Z79899 Other long term (current) drug therapy: Secondary | ICD-10-CM | POA: Insufficient documentation

## 2013-04-08 DIAGNOSIS — J3489 Other specified disorders of nose and nasal sinuses: Secondary | ICD-10-CM | POA: Diagnosis not present

## 2013-04-08 DIAGNOSIS — R509 Fever, unspecified: Secondary | ICD-10-CM | POA: Diagnosis not present

## 2013-04-08 DIAGNOSIS — R05 Cough: Secondary | ICD-10-CM | POA: Diagnosis not present

## 2013-04-08 LAB — CBC WITH DIFFERENTIAL/PLATELET
Basophils Relative: 0 % (ref 0–1)
Eosinophils Absolute: 0.3 10*3/uL (ref 0.0–0.7)
Hemoglobin: 15.4 g/dL (ref 13.0–17.0)
MCHC: 37 g/dL — ABNORMAL HIGH (ref 30.0–36.0)
Monocytes Relative: 13 % — ABNORMAL HIGH (ref 3–12)
Neutro Abs: 2.8 10*3/uL (ref 1.7–7.7)
Neutrophils Relative %: 58 % (ref 43–77)
Platelets: 145 10*3/uL — ABNORMAL LOW (ref 150–400)
RBC: 4.81 MIL/uL (ref 4.22–5.81)

## 2013-04-08 LAB — URINALYSIS, ROUTINE W REFLEX MICROSCOPIC
Glucose, UA: NEGATIVE mg/dL
Nitrite: NEGATIVE
Urobilinogen, UA: 0.2 mg/dL (ref 0.0–1.0)
pH: 5 (ref 5.0–8.0)

## 2013-04-08 LAB — COMPREHENSIVE METABOLIC PANEL
ALT: 42 U/L (ref 0–53)
AST: 36 U/L (ref 0–37)
Albumin: 3.9 g/dL (ref 3.5–5.2)
Alkaline Phosphatase: 66 U/L (ref 39–117)
BUN: 14 mg/dL (ref 6–23)
Chloride: 100 mEq/L (ref 96–112)
Potassium: 4.2 mEq/L (ref 3.5–5.1)
Sodium: 136 mEq/L (ref 135–145)
Total Bilirubin: 0.5 mg/dL (ref 0.3–1.2)
Total Protein: 7.4 g/dL (ref 6.0–8.3)

## 2013-04-08 LAB — CK: Total CK: 77 U/L (ref 7–232)

## 2013-04-08 LAB — URINE MICROSCOPIC-ADD ON

## 2013-04-08 MED ORDER — SODIUM CHLORIDE 0.9 % IV BOLUS (SEPSIS)
1000.0000 mL | Freq: Once | INTRAVENOUS | Status: AC
  Administered 2013-04-08: 1000 mL via INTRAVENOUS

## 2013-04-08 MED ORDER — ALBUTEROL SULFATE HFA 108 (90 BASE) MCG/ACT IN AERS: 4.0000 | INHALATION_SPRAY | RESPIRATORY_TRACT | Status: DC | PRN

## 2013-04-08 MED ORDER — KETOROLAC TROMETHAMINE 30 MG/ML IJ SOLN
15.0000 mg | Freq: Once | INTRAMUSCULAR | Status: AC
  Administered 2013-04-08: 15 mg via INTRAVENOUS
  Filled 2013-04-08: qty 1

## 2013-04-08 MED ORDER — ALBUTEROL SULFATE HFA 108 (90 BASE) MCG/ACT IN AERS
6.0000 | INHALATION_SPRAY | Freq: Once | RESPIRATORY_TRACT | Status: AC
  Administered 2013-04-08: 6 via RESPIRATORY_TRACT
  Filled 2013-04-08 (×2): qty 6.7

## 2013-04-08 NOTE — ED Notes (Signed)
Per EMS, pt from home c/o fever, generalized body aches, abd pain, and cough x1 week. VSS BP 140/90, HR 72
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
## 2013-04-08 NOTE — ED Notes (Signed)
Pt c/o body aches, congestion and runny nose for past week. States he is unable sleep at night due to nasal congestion. Pt checked temperature today and it was elevated so he called ems.
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
## 2013-04-09 NOTE — ED Provider Notes (Signed)
I have supervised the resident on the management of this patient and agree with the note above. I personally interviewed and examined the patient and my addendum is below.   Matthew Meza is a 72 y.o. male here with myalgias and subjective fever for a week. Has some sinus congestion. Also had some diffuse myalgias. Labs unremarkable including CK level. CXR showed viral bronchitis. Patient given albuterol and toradol and IVF and felt slightly better. D/c home on albuterol for bronchitis.    Richardean Canal, MD 04/09/13 2117

## 2013-04-20 ENCOUNTER — Ambulatory Visit: Payer: Medicare Other

## 2013-04-20 ENCOUNTER — Ambulatory Visit (INDEPENDENT_AMBULATORY_CARE_PROVIDER_SITE_OTHER): Payer: Medicare Other | Admitting: Family Medicine

## 2013-04-20 VITALS — BP 126/74 | HR 84 | Temp 98.3°F | Resp 18 | Ht 67.5 in | Wt 198.0 lb

## 2013-04-20 DIAGNOSIS — IMO0001 Reserved for inherently not codable concepts without codable children: Secondary | ICD-10-CM | POA: Diagnosis not present

## 2013-04-20 DIAGNOSIS — R5381 Other malaise: Secondary | ICD-10-CM

## 2013-04-20 DIAGNOSIS — R5383 Other fatigue: Secondary | ICD-10-CM | POA: Diagnosis not present

## 2013-04-20 DIAGNOSIS — R350 Frequency of micturition: Secondary | ICD-10-CM

## 2013-04-20 DIAGNOSIS — R05 Cough: Secondary | ICD-10-CM | POA: Diagnosis not present

## 2013-04-20 DIAGNOSIS — Z125 Encounter for screening for malignant neoplasm of prostate: Secondary | ICD-10-CM | POA: Diagnosis not present

## 2013-04-20 DIAGNOSIS — R351 Nocturia: Secondary | ICD-10-CM | POA: Diagnosis not present

## 2013-04-20 DIAGNOSIS — M791 Myalgia, unspecified site: Secondary | ICD-10-CM

## 2013-04-20 LAB — GLUCOSE, POCT (MANUAL RESULT ENTRY): POC Glucose: 100 mg/dl — AB (ref 70–99)

## 2013-04-20 LAB — POCT UA - MICROSCOPIC ONLY
Casts, Ur, LPF, POC: NEGATIVE
Mucus, UA: NEGATIVE

## 2013-04-20 LAB — POCT CBC
Granulocyte percent: 69.2 %G (ref 37–80)
Hemoglobin: 14.7 g/dL (ref 14.1–18.1)
MCH, POC: 31.5 pg — AB (ref 27–31.2)
MID (cbc): 0.5 (ref 0–0.9)
MPV: 9 fL (ref 0–99.8)
POC MID %: 6.4 %M (ref 0–12)
Platelet Count, POC: 247 10*3/uL (ref 142–424)
RBC: 4.66 M/uL — AB (ref 4.69–6.13)
WBC: 8.2 10*3/uL (ref 4.6–10.2)

## 2013-04-20 LAB — POCT URINALYSIS DIPSTICK
Bilirubin, UA: NEGATIVE
Glucose, UA: NEGATIVE
Protein, UA: 30
Spec Grav, UA: 1.02

## 2013-04-20 MED ORDER — DOXYCYCLINE HYCLATE 100 MG PO TABS: 100.0000 mg | ORAL_TABLET | Freq: Two times a day (BID) | ORAL | Status: DC

## 2013-04-20 NOTE — Patient Instructions (Signed)
Start the antibiotic - doxycycline, drink plenty of fluids. Albuterol inhaler if needed for cough, wheeze, or shortness of breath.  You should receive a call or letter about your lab results within the next week to 10 days, but likely at next visit in next few days. Return to the clinic or go to the nearest emergency room if any of your symptoms worsen or new symptoms occur.
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
## 2013-04-21 LAB — COMPREHENSIVE METABOLIC PANEL
ALT: 24 U/L (ref 0–53)
AST: 18 U/L (ref 0–37)
Albumin: 4.3 g/dL (ref 3.5–5.2)
Alkaline Phosphatase: 70 U/L (ref 39–117)
BUN: 10 mg/dL (ref 6–23)
Chloride: 103 mEq/L (ref 96–112)
Potassium: 4 mEq/L (ref 3.5–5.3)

## 2013-04-21 LAB — PSA, MEDICARE: PSA: 11.77 ng/mL — ABNORMAL HIGH (ref <=4.00)

## 2013-04-22 LAB — URINE CULTURE: Organism ID, Bacteria: NO GROWTH

## 2013-04-24 ENCOUNTER — Ambulatory Visit (INDEPENDENT_AMBULATORY_CARE_PROVIDER_SITE_OTHER): Payer: Medicare Other | Admitting: Family Medicine

## 2013-04-24 VITALS — BP 122/64 | HR 73 | Temp 97.7°F | Resp 16 | Ht 67.0 in | Wt 198.4 lb

## 2013-04-24 DIAGNOSIS — R972 Elevated prostate specific antigen [PSA]: Secondary | ICD-10-CM | POA: Diagnosis not present

## 2013-04-24 DIAGNOSIS — R05 Cough: Secondary | ICD-10-CM

## 2013-04-24 DIAGNOSIS — R7309 Other abnormal glucose: Secondary | ICD-10-CM | POA: Diagnosis not present

## 2013-04-24 DIAGNOSIS — R739 Hyperglycemia, unspecified: Secondary | ICD-10-CM

## 2013-04-24 DIAGNOSIS — N419 Inflammatory disease of prostate, unspecified: Secondary | ICD-10-CM

## 2013-04-24 MED ORDER — DOXYCYCLINE HYCLATE 100 MG PO TABS: 100.0000 mg | ORAL_TABLET | Freq: Two times a day (BID) | ORAL | Status: DC

## 2013-04-24 NOTE — Progress Notes (Signed)
Subjective:    Patient ID: Matthew Meza, male    DOB: [birth year], 72 y.o.   MRN: [id number]  HPI Matthew Meza is a 72 y.o. male  See ov 4 days ago.  Malaise, flu-like illness feeling with cough, myalgias, slight improvement after ER visit on 04/08/13 for viral bronchitis - noted borderline GFR at that time, but normal CK level, CXR with suspected viral bronchitis.  Persistent cough - possible atypical bronchitis vs copd/aecb. Started doxycycline 100mg  BID, albuterol if increased cough/wheeze, and here for recheck. Feels considerably better.  Less bodyaches, rare cough, no coughing spells. Has not needed inhaler since last ov, no fever.  XR report from last ov: CHEST - 2 VIEW  Comparison: None. Findings: The heart, mediastinum and hila are within normal limits.  The lungs are clear. The bony thorax is intact. IMPRESSION: No active disease of the chest. has  Also noted urinary hesitancy, trace pyuria - possible underlying prostatitis or prostate enlargement. Reassuring cbc last ov -  Doxycycline was started last ov, but prostate exam declined.  PSA elevated as below at 11.77, with negative urine culture. Urinary symptoms seem to be clearing up - still few times of nocturia at night, but feels less urgency. Able to urinate more fully. Less hesitancy.  (had been noticing those sx's for 3-4 months). Creat 1.20.    Results for orders placed in visit on 04/20/13  URINE CULTURE      Result Value Range   Colony Count NO GROWTH     Organism ID, Bacteria NO GROWTH    PSA, MEDICARE      Result Value Range   PSA 11.77 (*) <=4.00 ng/mL  COMPREHENSIVE METABOLIC PANEL      Result Value Range   Sodium 140  135 - 145 mEq/L   Potassium 4.0  3.5 - 5.3 mEq/L   Chloride 103  96 - 112 mEq/L   CO2 29  19 - 32 mEq/L   Glucose, Bld 123 (*) 70 - 99 mg/dL   BUN 10  6 - 23 mg/dL   Creat 9.81  1.91 - 4.78 mg/dL   Total Bilirubin 0.5  0.3 - 1.2 mg/dL   Alkaline Phosphatase 70  39 - 117 U/L   AST 18  0 -  37 U/L   ALT 24  0 - 53 U/L   Total Protein 7.2  6.0 - 8.3 g/dL   Albumin 4.3  3.5 - 5.2 g/dL   Calcium 9.6  8.4 - 29.5 mg/dL  CK      Result Value Range   Total CK 38  7 - 232 U/L  POCT CBC      Result Value Range   WBC 8.2  4.6 - 10.2 K/uL   Lymph, poc 2.0  0.6 - 3.4   POC LYMPH PERCENT 24.4  10 - 50 %L   MID (cbc) 0.5  0 - 0.9   POC MID % 6.4  0 - 12 %M   POC Granulocyte 5.7  2 - 6.9   Granulocyte percent 69.2  37 - 80 %G   RBC 4.66 (*) 4.69 - 6.13 M/uL   Hemoglobin 14.7  14.1 - 18.1 g/dL   HCT, POC 62.1  30.8 - 53.7 %   MCV 96.5  80 - 97 fL   MCH, POC 31.5 (*) 27 - 31.2 pg   MCHC 32.7  31.8 - 35.4 g/dL   RDW, POC 65.7     Platelet Count, POC 247  142 -  424 K/uL   MPV 9.0  0 - 99.8 fL  POCT UA - MICROSCOPIC ONLY      Result Value Range   WBC, Ur, HPF, POC 1-3     RBC, urine, microscopic 0-1     Bacteria, U Microscopic trace     Mucus, UA neg     Epithelial cells, urine per micros neg     Crystals, Ur, HPF, POC neg     Casts, Ur, LPF, POC neg     Yeast, UA neg     Amorphous pos    POCT URINALYSIS DIPSTICK      Result Value Range   Color, UA dark yellow     Clarity, UA slghtly cloudy     Glucose, UA neg     Bilirubin, UA neg     Ketones, UA trace     Spec Grav, UA 1.020     Blood, UA neg     pH, UA 7.0     Protein, UA 30     Urobilinogen, UA 0.2     Nitrite, UA neg     Leukocytes, UA small (1+)    GLUCOSE, POCT (MANUAL RESULT ENTRY)      Result Value Range   POC Glucose 100 (*) 70 - 99 mg/dl   Borderline hyperglycemia - not fasting last ov.  Plans on setting up primary care with [facility].   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  Declined psychiatric care at that time, and did not appear to be harm to self or others.   Review of Systems  Constitutional: Negative for fever and chills.  Respiratory: Negative for cough, shortness of breath and wheezing.   Gastrointestinal: Negative  for abdominal pain.  Genitourinary: Positive for difficulty urinating (as above. ). Negative for dysuria, urgency, frequency, hematuria, decreased urine volume and testicular pain.  Skin: Negative for rash.       Objective:   Physical Exam  Constitutional: He is oriented to person, place, and time. He appears well-developed and well-nourished.  HENT:  Head: Normocephalic and atraumatic.  Right Ear: Tympanic membrane, external ear and ear canal normal.  Left Ear: Tympanic membrane, external ear and ear canal normal.  Nose: No rhinorrhea.  Mouth/Throat: Oropharynx is clear and moist and mucous membranes are normal. No oropharyngeal exudate or posterior oropharyngeal erythema.  Eyes: Conjunctivae are normal. Pupils are equal, round, and reactive to light.  Neck: Neck supple.  Cardiovascular: Normal rate, regular rhythm, normal heart sounds and intact distal pulses.   No murmur heard. Pulmonary/Chest: Effort normal and breath sounds normal. He has no wheezes. He has no rhonchi. He has no rales.  Abdominal: Soft. There is no tenderness.  Genitourinary:  Prostate exam refused.  Lymphadenopathy:    He has no cervical adenopathy.  Neurological: He is alert and oriented to person, place, and time.  Skin: Skin is warm and dry. No rash noted.  Psychiatric: He has a normal mood and affect. His behavior is normal. Thought content normal. He expresses no homicidal and no suicidal ideation.  Well dressed, calm, and appropriate in response and eye contact.        Assessment & Plan:  [name] is a 72 y.o. male Prior malaise/flu like symptoms with cough - likley viral bronchitis, and now with elevated PSA and few months of noctriuria/hesitancy - probable Prostatitis  Improving on doxy, urine cx. Negative. Discussed DDX includes prostate tumor or cancer  and recommended exam, but declined.  Will return for repeat PSA in 3 weeks, continue doxy as improving - extended to 2 weeks treatement, but  consider change to Cipro if not continuing o improve/resolve. .  rtc precautions discussed, understanding expressed.   Cough improved. No new treatments at present. Has inhaler if needed.    Patient Instructions  Watch diet - healthy foods, and recheck fasting blood sugar in next 3 months with your primary care provider.  continue antibiotic for prostate (4 additional days prescribed) but as discussed today  - the elevated prostate test can be due to infection, enlarging prostate, or prostate cancer and recommend a rectal exam to help with this diagnosis.  Recheck for repeat prostate test within a week of finishing the antibiotic. Return to the clinic or go to the nearest emergency room if any of your symptoms worsen or new symptoms occur.

## 2013-04-24 NOTE — Patient Instructions (Addendum)
Watch diet - healthy foods, and recheck fasting blood sugar in next 3 months with your primary care provider.  continue antibiotic for prostate (4 additional days prescribed) but as discussed today  - the elevated prostate test can be due to infection, enlarging prostate, or prostate cancer and recommend a rectal exam to help with this diagnosis.  Recheck for repeat prostate test within a week of finishing the antibiotic. Return to the clinic or go to the nearest emergency room if any of your symptoms worsen or new symptoms occur.

## 2013-04-25 ENCOUNTER — Telehealth: Payer: Self-pay

## 2013-04-25 NOTE — Telephone Encounter (Signed)
Please advise sunscreen/ hat or change antibiotic?

## 2013-04-25 NOTE — Telephone Encounter (Signed)
Dr Neva Seat  Patient is requesting a change in antibiotics.  The [facility] starts today - [place] and this medication limits his ability to be in the sun.   [phone]

## 2013-04-26 NOTE — Telephone Encounter (Signed)
I recommend protective clothing, a hat and sunscreen, along with a shady area for listening to music.  He needs to stay well-hydrated, and get lots of rest.

## 2013-04-27 NOTE — Telephone Encounter (Signed)
LMOM with instructions for pt.

## 2013-06-01 ENCOUNTER — Encounter (HOSPITAL_COMMUNITY): Payer: Self-pay | Admitting: Emergency Medicine

## 2013-06-01 DIAGNOSIS — S199XXA Unspecified injury of neck, initial encounter: Secondary | ICD-10-CM | POA: Diagnosis not present

## 2013-06-01 DIAGNOSIS — R079 Chest pain, unspecified: Secondary | ICD-10-CM | POA: Diagnosis not present

## 2013-06-01 DIAGNOSIS — M545 Low back pain, unspecified: Secondary | ICD-10-CM | POA: Insufficient documentation

## 2013-06-01 DIAGNOSIS — M546 Pain in thoracic spine: Secondary | ICD-10-CM | POA: Insufficient documentation

## 2013-06-01 DIAGNOSIS — IMO0002 Reserved for concepts with insufficient information to code with codable children: Secondary | ICD-10-CM | POA: Diagnosis not present

## 2013-06-01 DIAGNOSIS — M549 Dorsalgia, unspecified: Secondary | ICD-10-CM | POA: Diagnosis not present

## 2013-06-01 DIAGNOSIS — M542 Cervicalgia: Secondary | ICD-10-CM | POA: Diagnosis not present

## 2013-06-01 DIAGNOSIS — Z7982 Long term (current) use of aspirin: Secondary | ICD-10-CM | POA: Insufficient documentation

## 2013-06-01 DIAGNOSIS — Z87891 Personal history of nicotine dependence: Secondary | ICD-10-CM | POA: Insufficient documentation

## 2013-06-01 DIAGNOSIS — Z79899 Other long term (current) drug therapy: Secondary | ICD-10-CM | POA: Insufficient documentation

## 2013-06-01 DIAGNOSIS — Y998 Other external cause status: Secondary | ICD-10-CM | POA: Insufficient documentation

## 2013-06-01 DIAGNOSIS — IMO0001 Reserved for inherently not codable concepts without codable children: Secondary | ICD-10-CM | POA: Diagnosis not present

## 2013-06-01 DIAGNOSIS — Y92410 Unspecified street and highway as the place of occurrence of the external cause: Secondary | ICD-10-CM | POA: Insufficient documentation

## 2013-06-01 DIAGNOSIS — S0993XA Unspecified injury of face, initial encounter: Secondary | ICD-10-CM | POA: Diagnosis not present
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
## 2013-06-02 ENCOUNTER — Emergency Department (HOSPITAL_COMMUNITY)
Admission: EM | Admit: 2013-06-02 | Discharge: 2013-06-02 | Disposition: A | Discharge status: Home / Self Care | Payer: No Typology Code available for payment source | Attending: Emergency Medicine | Admitting: Emergency Medicine

## 2013-06-02 ENCOUNTER — Emergency Department (HOSPITAL_COMMUNITY): Payer: No Typology Code available for payment source

## 2013-06-02 DIAGNOSIS — M542 Cervicalgia: Secondary | ICD-10-CM

## 2013-06-02 DIAGNOSIS — M7918 Myalgia, other site: Secondary | ICD-10-CM

## 2013-06-02 DIAGNOSIS — R079 Chest pain, unspecified: Secondary | ICD-10-CM | POA: Diagnosis not present

## 2013-06-02 DIAGNOSIS — M549 Dorsalgia, unspecified: Secondary | ICD-10-CM | POA: Diagnosis not present

## 2013-06-02 DIAGNOSIS — IMO0002 Reserved for concepts with insufficient information to code with codable children: Secondary | ICD-10-CM | POA: Diagnosis not present

## 2013-06-02 DIAGNOSIS — S199XXA Unspecified injury of neck, initial encounter: Secondary | ICD-10-CM | POA: Diagnosis not present

## 2013-06-02 MED ORDER — CYCLOBENZAPRINE HCL 10 MG PO TABS: 10.0000 mg | ORAL_TABLET | Freq: Two times a day (BID) | ORAL | Status: DC | PRN
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
## 2013-07-08 ENCOUNTER — Ambulatory Visit (INDEPENDENT_AMBULATORY_CARE_PROVIDER_SITE_OTHER): Payer: Medicare Other | Admitting: Family Medicine

## 2013-07-08 ENCOUNTER — Ambulatory Visit: Payer: Medicare Other | Admitting: Family Medicine

## 2013-07-08 ENCOUNTER — Encounter: Payer: Self-pay | Admitting: Family Medicine

## 2013-07-08 VITALS — BP 150/80 | HR 65 | Temp 97.9°F | Resp 16 | Ht 67.0 in | Wt 197.6 lb

## 2013-07-08 DIAGNOSIS — R972 Elevated prostate specific antigen [PSA]: Secondary | ICD-10-CM | POA: Diagnosis not present

## 2013-07-08 DIAGNOSIS — R05 Cough: Secondary | ICD-10-CM

## 2013-07-08 DIAGNOSIS — J209 Acute bronchitis, unspecified: Secondary | ICD-10-CM | POA: Diagnosis not present

## 2013-07-08 DIAGNOSIS — R259 Unspecified abnormal involuntary movements: Secondary | ICD-10-CM

## 2013-07-08 DIAGNOSIS — R251 Tremor, unspecified: Secondary | ICD-10-CM

## 2013-07-08 MED ORDER — AZITHROMYCIN 250 MG PO TABS: ORAL_TABLET | ORAL | Status: DC
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
## 2013-07-08 NOTE — Patient Instructions (Addendum)
We will refer you to neurology to evaluate the tremor and to decide on other tests or possible imaging. I did check a blood count and thyroid test today.  If any headache, or new or worsening neurologic symptoms - Return to the clinic or go to the nearest emergency room.  mucinex for cough,  Start antibiotic in next 2-3 days if not improving, and if eyes still crusting.watering in 3 days - call me for drops.  You should receive a call or letter about your lab results within the next week to 10 days.   Return to the clinic or go to the nearest emergency room if any of your symptoms worsen or new symptoms occur.  Recommend scheduling a physical in the next 3-6 months.  Cough, Adult  A cough is a reflex that helps clear your throat and airways. It can help heal the body or may be a reaction to an irritated airway. A cough may only last 2 or 3 weeks (acute) or may last more than 8 weeks (chronic).  CAUSES Acute cough:  Viral or bacterial infections. Chronic cough:  Infections.  Allergies.  Asthma.  Post-nasal drip.  Smoking.  Heartburn or acid reflux.  Some medicines.  Chronic lung problems (COPD).  Cancer. SYMPTOMS   Cough.  Fever.  Chest pain.  Increased breathing rate.  High-pitched whistling sound when breathing (wheezing).  Colored mucus that you cough up (sputum). TREATMENT   A bacterial cough may be treated with antibiotic medicine.  A viral cough must run its course and will not respond to antibiotics.  Your caregiver may recommend other treatments if you have a chronic cough. HOME CARE INSTRUCTIONS   Only take over-the-counter or prescription medicines for pain, discomfort, or fever as directed by your caregiver. Use cough suppressants only as directed by your caregiver.  Use a cold steam vaporizer or humidifier in your bedroom or home to help loosen secretions.  Sleep in a semi-upright position if your cough is worse at night.  Rest as  needed.  Stop smoking if you smoke. SEEK IMMEDIATE MEDICAL CARE IF:   You have pus in your sputum.  Your cough starts to worsen.  You cannot control your cough with suppressants and are losing sleep.  You begin coughing up blood.  You have difficulty breathing.  You develop pain which is getting worse or is uncontrolled with medicine.  You have a fever. MAKE SURE YOU:   Understand these instructions.  Will watch your condition.  Will get help right away if you are not doing well or get worse. Document Released: 05/20/2011 Document Revised: 02/13/2012 Document Reviewed: 05/20/2011 [facility] Patient Information 2014 [place], [place].    Tremor Tremor is a rhythmic, involuntary muscular contraction characterized by oscillations (to-and-fro movements) of a part of the body. The most common of all involuntary movements, tremor can affect various body parts such as the hands, head, facial structures, vocal cords, trunk, and legs; most tremors, however, occur in the hands. Tremor often accompanies neurological disorders associated with aging. Although the disorder is not life-threatening, it can be responsible for functional disability and social embarrassment. TREATMENT  There are many types of tremor and several ways in which tremor is classified. The most common classification is by behavioral context or position. There are five categories of tremor within this classification: resting, postural, kinetic, task-specific, and psychogenic. Resting or static tremor occurs when the muscle is at rest, for example when the hands are lying on the lap. This type of  tremor is often seen in patients with Parkinson's disease. Postural tremor occurs when a patient attempts to maintain posture, such as holding the hands outstretched. Postural tremors include physiological tremor, essential tremor, tremor with basal ganglia disease (also seen in patients with Parkinson's disease), cerebellar postural  tremor, tremor with peripheral neuropathy, post-traumatic tremor, and alcoholic tremor. Kinetic or intention (action) tremor occurs during purposeful movement, for example during finger-to-nose testing. Task-specific tremor appears when performing goal-oriented tasks such as handwriting, speaking, or standing. This group consists of primary writing tremor, vocal tremor, and orthostatic tremor. Psychogenic tremor occurs in both older and younger patients. The key feature of this tremor is that it dramatically lessens or disappears when the patient is distracted. PROGNOSIS There are some treatment options available for tremor; the appropriate treatment depends on accurate diagnosis of the cause. Some tremors respond to treatment of the underlying condition, for example in some cases of psychogenic tremor treating the patient's underlying mental problem may cause the tremor to disappear. Also, patients with tremor due to Parkinson's disease may be treated with Levodopa drug therapy. Symptomatic drug therapy is available for several other tremors as well. For those cases of tremor in which there is no effective drug treatment, physical measures such as teaching the patient to brace the affected limb during the tremor are sometimes useful. Surgical intervention such as thalamotomy or deep brain stimulation may be useful in certain cases. Document Released: 11/11/2002 Document Revised: 02/13/2012 Document Reviewed: 11/21/2005 [facility] Patient Information 2014 [place], [place].

## 2013-07-09 LAB — TSH: TSH: 3.696 u[IU]/mL (ref 0.350–4.500)

## 2013-07-09 LAB — CBC
HCT: 43 % (ref 39.0–52.0)
Hemoglobin: 14.8 g/dL (ref 13.0–17.0)
MCH: 30.9 pg (ref 26.0–34.0)
RBC: 4.79 MIL/uL (ref 4.22–5.81)

## 2013-07-09 LAB — PSA: PSA: 3.31 ng/mL (ref <=4.00)

## 2013-07-29 ENCOUNTER — Telehealth: Payer: Self-pay
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
## 2013-07-29 NOTE — Telephone Encounter (Signed)
Pt is needing to talk with dr Neva Seat about sending patient to a new neurologist that will take third party payments-  Best number [phone]

## 2013-07-29 NOTE — Telephone Encounter (Signed)
My intention was that he could be seen in a week to determine if imaging needed, but if needs to see other neurologist, and still same symptoms - may need to order imaging prior to neuro eval. How soon can we get him in elsewhere?  If any headache, or new or worsening neurologic symptoms - Return to the clinic or go to the nearest emergency room as may need acute workup.

## 2013-07-30 NOTE — Telephone Encounter (Signed)
Matthew Meza advised to send the referral to [facility], will let me know how long it will be for the appt.

## 2013-08-10 ENCOUNTER — Ambulatory Visit (INDEPENDENT_AMBULATORY_CARE_PROVIDER_SITE_OTHER): Payer: Medicare Other | Admitting: Family Medicine

## 2013-08-10 VITALS — BP 124/80 | HR 72 | Temp 98.3°F | Resp 16 | Ht 67.5 in | Wt 200.0 lb

## 2013-08-10 DIAGNOSIS — H60392 Other infective otitis externa, left ear: Secondary | ICD-10-CM

## 2013-08-10 DIAGNOSIS — H60399 Other infective otitis externa, unspecified ear: Secondary | ICD-10-CM

## 2013-08-10 DIAGNOSIS — K047 Periapical abscess without sinus: Secondary | ICD-10-CM | POA: Diagnosis not present

## 2013-08-10 MED ORDER — AMOXICILLIN 875 MG PO TABS: 875.0000 mg | ORAL_TABLET | Freq: Two times a day (BID) | ORAL | Status: DC

## 2013-08-10 MED ORDER — NEOMYCIN-POLYMYXIN-HC 3.5-10000-1 OT SOLN: 3.0000 [drp] | Freq: Four times a day (QID) | OTIC | Status: DC

## 2013-08-10 NOTE — Patient Instructions (Signed)
Otitis Externa Otitis externa is a bacterial or fungal infection of the outer ear canal. This is the area from the eardrum to the outside of the ear. Otitis externa is sometimes called "swimmer's ear." CAUSES  Possible causes of infection include:  Swimming in dirty water.  Moisture remaining in the ear after swimming or bathing.  Mild injury (trauma) to the ear.  Objects stuck in the ear (foreign body).  Cuts or scrapes (abrasions) on the outside of the ear. SYMPTOMS  The first symptom of infection is often itching in the ear canal. Later signs and symptoms may include swelling and redness of the ear canal, ear pain, and yellowish-white fluid (pus) coming from the ear. The ear pain may be worse when pulling on the earlobe. DIAGNOSIS  Your caregiver will perform a physical exam. A sample of fluid may be taken from the ear and examined for bacteria or fungi. TREATMENT  Antibiotic ear drops are often given for 10 to 14 days. Treatment may also include pain medicine or corticosteroids to reduce itching and swelling. PREVENTION   Keep your ear dry. Use the corner of a towel to absorb water out of the ear canal after swimming or bathing.  Avoid scratching or putting objects inside your ear. This can damage the ear canal or remove the protective wax that lines the canal. This makes it easier for bacteria and fungi to grow.  Avoid swimming in lakes, polluted water, or poorly chlorinated pools.  You may use ear drops made of rubbing alcohol and vinegar after swimming. Combine equal parts of white vinegar and alcohol in a bottle. Put 3 or 4 drops into each ear after swimming. HOME CARE INSTRUCTIONS   Apply antibiotic ear drops to the ear canal as prescribed by your caregiver.  Only take over-the-counter or prescription medicines for pain, discomfort, or fever as directed by your caregiver.  If you have diabetes, follow any additional treatment instructions from your caregiver.  Keep all  follow-up appointments as directed by your caregiver. SEEK MEDICAL CARE IF:   You have a fever.  Your ear is still red, swollen, painful, or draining pus after 3 days.  Your redness, swelling, or pain gets worse.  You have a severe headache.  You have redness, swelling, pain, or tenderness in the area behind your ear. MAKE SURE YOU:   Understand these instructions.  Will watch your condition.  Will get help right away if you are not doing well or get worse. Document Released: 11/21/2005 Document Revised: 02/13/2012 Document Reviewed: 12/08/2011 [employer] Patient Information 2014 [employer].  

## 2013-08-10 NOTE — Progress Notes (Signed)
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  Developed decreased hearing and pain in left ear, with persistent dental pain.  He's had no fever, neck pain, shortness of breath, eye pain or decreased vision.  Objective: No acute distress HEENT: Moderate swelling of left jaw of location of tooth #18. He has overlying erythema as well. In addition he has exudates in his left ear canal with some bulging of the left TM. Neck: Supple no adenopathy or thyromegaly Skin: Normal auricle and facial skin Oropharynx otherwise negative  Assessment: Otitis externa, left. Early dental abscess left side over tooth #18  Plan: Otitis, externa, infective, left - Plan: neomycin-polymyxin-hydrocortisone (CORTISPORIN) otic solution  Dental abscess - Plan: amoxicillin (AMOXIL) 875 MG tablet  Signed, Elvina Sidle, MD

## 2013-08-22 ENCOUNTER — Ambulatory Visit: Payer: Medicare Other | Admitting: Neurology

## 2013-09-16 ENCOUNTER — Telehealth: Payer: Self-pay

## 2013-09-16 ENCOUNTER — Other Ambulatory Visit: Payer: Self-pay | Admitting: Family Medicine

## 2013-09-16 DIAGNOSIS — Z299 Encounter for prophylactic measures, unspecified: Secondary | ICD-10-CM

## 2013-09-16 NOTE — Telephone Encounter (Signed)
Patient would like to get a referral for a colonoscopy.     Best#: [id number]

## 2013-09-16 NOTE — Telephone Encounter (Signed)
Please advise. You have only seen for acute illness.

## 2013-09-20 ENCOUNTER — Telehealth: Payer: Self-pay | Admitting: Family Medicine

## 2013-09-20 DIAGNOSIS — R413 Other amnesia: Secondary | ICD-10-CM | POA: Diagnosis not present

## 2013-09-20 DIAGNOSIS — G250 Essential tremor: Secondary | ICD-10-CM | POA: Diagnosis not present

## 2013-09-20 DIAGNOSIS — F22 Delusional disorders: Secondary | ICD-10-CM | POA: Diagnosis not present

## 2013-09-20 NOTE — Telephone Encounter (Signed)
Called by Dr. Zola Button about eval of Matthew Meza. Diagnosed with mild postural tremor. Recommended against any specific medicines as may have psychiatric side effects, and asked about delusional thoughts and chronicity.  Discussed these have been noted before and prior mental Health eval, but stable without perceived harm to self or others at last office visit. Per phone call, does not appear to have new delusions or acute worsening.

## 2013-10-04 DIAGNOSIS — R413 Other amnesia: Secondary | ICD-10-CM | POA: Diagnosis not present

## 2013-10-04 DIAGNOSIS — R259 Unspecified abnormal involuntary movements: Secondary | ICD-10-CM | POA: Diagnosis not present

## 2013-10-04 DIAGNOSIS — I998 Other disorder of circulatory system: Secondary | ICD-10-CM | POA: Diagnosis not present

## 2013-10-04 DIAGNOSIS — J329 Chronic sinusitis, unspecified: Secondary | ICD-10-CM | POA: Diagnosis not present

## 2013-11-20 ENCOUNTER — Ambulatory Visit (INDEPENDENT_AMBULATORY_CARE_PROVIDER_SITE_OTHER): Payer: Medicare Other | Admitting: Family Medicine

## 2013-11-20 VITALS — BP 124/70 | HR 73 | Temp 98.3°F | Resp 18 | Ht 67.0 in | Wt 202.2 lb

## 2013-11-20 DIAGNOSIS — N529 Male erectile dysfunction, unspecified: Secondary | ICD-10-CM

## 2013-11-20 DIAGNOSIS — Z23 Encounter for immunization: Secondary | ICD-10-CM | POA: Diagnosis not present

## 2013-11-20 MED ORDER — SILDENAFIL CITRATE 100 MG PO TABS: 50.0000 mg | ORAL_TABLET | Freq: Every day | ORAL | Status: DC | PRN

## 2013-11-20 NOTE — Progress Notes (Signed)
72 yo widower (wife in 2000-05-11) and son died 11-May-2004.  No intimate relationships until recently.  Patient with prolonged period of time where he was somewhat paranoid and are willing to participate in social activities. He seems to gotten over this and is feeling much better about himself and his life situation. He was like to try Viagra if this. Moves. Patient's having no chest pain, shortness of breath or exercise intolerance  Need for prophylactic vaccination and inoculation against influenza - Plan: Flu Vaccine QUAD 36+ mos IM  Erectile dysfunction - Plan: sildenafil (VIAGRA) 100 MG tablet  Signed, Elvina Sidle, MD

## 2013-11-21 ENCOUNTER — Telehealth: Payer: Self-pay | Admitting: Radiology

## 2013-11-21 MED ORDER — SILDENAFIL CITRATE 50 MG PO TABS: 50.0000 mg | ORAL_TABLET | Freq: Every day | ORAL | Status: DC | PRN

## 2013-11-21 NOTE — Telephone Encounter (Signed)
Patient states Viagra is very expensive. They are $30 each. He wants to know if we can change to 50 mg dose and only send in #3. This is done, Matthew Meza

## 2013-11-26 DIAGNOSIS — M999 Biomechanical lesion, unspecified: Secondary | ICD-10-CM | POA: Diagnosis not present

## 2013-11-26 DIAGNOSIS — M9981 Other biomechanical lesions of cervical region: Secondary | ICD-10-CM | POA: Diagnosis not present

## 2013-11-26 DIAGNOSIS — M531 Cervicobrachial syndrome: Secondary | ICD-10-CM | POA: Diagnosis not present

## 2013-12-02 DIAGNOSIS — M999 Biomechanical lesion, unspecified: Secondary | ICD-10-CM | POA: Diagnosis not present

## 2013-12-02 DIAGNOSIS — M9981 Other biomechanical lesions of cervical region: Secondary | ICD-10-CM | POA: Diagnosis not present

## 2013-12-02 DIAGNOSIS — M531 Cervicobrachial syndrome: Secondary | ICD-10-CM | POA: Diagnosis not present

## 2013-12-09 DIAGNOSIS — M9981 Other biomechanical lesions of cervical region: Secondary | ICD-10-CM | POA: Diagnosis not present

## 2013-12-09 DIAGNOSIS — M531 Cervicobrachial syndrome: Secondary | ICD-10-CM | POA: Diagnosis not present

## 2013-12-09 DIAGNOSIS — M999 Biomechanical lesion, unspecified: Secondary | ICD-10-CM | POA: Diagnosis not present

## 2013-12-12 DIAGNOSIS — M999 Biomechanical lesion, unspecified: Secondary | ICD-10-CM | POA: Diagnosis not present

## 2013-12-12 DIAGNOSIS — M9981 Other biomechanical lesions of cervical region: Secondary | ICD-10-CM | POA: Diagnosis not present

## 2013-12-12 DIAGNOSIS — M531 Cervicobrachial syndrome: Secondary | ICD-10-CM | POA: Diagnosis not present

## 2013-12-16 DIAGNOSIS — M9981 Other biomechanical lesions of cervical region: Secondary | ICD-10-CM | POA: Diagnosis not present

## 2013-12-16 DIAGNOSIS — M999 Biomechanical lesion, unspecified: Secondary | ICD-10-CM | POA: Diagnosis not present

## 2013-12-16 DIAGNOSIS — M531 Cervicobrachial syndrome: Secondary | ICD-10-CM | POA: Diagnosis not present

## 2013-12-26 DIAGNOSIS — M999 Biomechanical lesion, unspecified: Secondary | ICD-10-CM | POA: Diagnosis not present

## 2013-12-26 DIAGNOSIS — M531 Cervicobrachial syndrome: Secondary | ICD-10-CM | POA: Diagnosis not present

## 2013-12-26 DIAGNOSIS — M9981 Other biomechanical lesions of cervical region: Secondary | ICD-10-CM | POA: Diagnosis not present

## 2013-12-31 DIAGNOSIS — M999 Biomechanical lesion, unspecified: Secondary | ICD-10-CM | POA: Diagnosis not present

## 2013-12-31 DIAGNOSIS — M9981 Other biomechanical lesions of cervical region: Secondary | ICD-10-CM | POA: Diagnosis not present

## 2013-12-31 DIAGNOSIS — M531 Cervicobrachial syndrome: Secondary | ICD-10-CM | POA: Diagnosis not present

## 2014-01-02 DIAGNOSIS — M999 Biomechanical lesion, unspecified: Secondary | ICD-10-CM | POA: Diagnosis not present

## 2014-01-02 DIAGNOSIS — M9981 Other biomechanical lesions of cervical region: Secondary | ICD-10-CM | POA: Diagnosis not present

## 2014-01-02 DIAGNOSIS — M531 Cervicobrachial syndrome: Secondary | ICD-10-CM | POA: Diagnosis not present

## 2014-01-14 DIAGNOSIS — M531 Cervicobrachial syndrome: Secondary | ICD-10-CM | POA: Diagnosis not present

## 2014-01-14 DIAGNOSIS — M9981 Other biomechanical lesions of cervical region: Secondary | ICD-10-CM | POA: Diagnosis not present

## 2014-01-14 DIAGNOSIS — M999 Biomechanical lesion, unspecified: Secondary | ICD-10-CM | POA: Diagnosis not present

## 2014-01-16 DIAGNOSIS — M999 Biomechanical lesion, unspecified: Secondary | ICD-10-CM | POA: Diagnosis not present

## 2014-01-16 DIAGNOSIS — M531 Cervicobrachial syndrome: Secondary | ICD-10-CM | POA: Diagnosis not present

## 2014-01-16 DIAGNOSIS — M9981 Other biomechanical lesions of cervical region: Secondary | ICD-10-CM | POA: Diagnosis not present

## 2014-01-20 DIAGNOSIS — M999 Biomechanical lesion, unspecified: Secondary | ICD-10-CM | POA: Diagnosis not present

## 2014-01-20 DIAGNOSIS — M9981 Other biomechanical lesions of cervical region: Secondary | ICD-10-CM | POA: Diagnosis not present

## 2014-01-20 DIAGNOSIS — M531 Cervicobrachial syndrome: Secondary | ICD-10-CM | POA: Diagnosis not present

## 2014-01-23 DIAGNOSIS — M9981 Other biomechanical lesions of cervical region: Secondary | ICD-10-CM | POA: Diagnosis not present

## 2014-01-23 DIAGNOSIS — M999 Biomechanical lesion, unspecified: Secondary | ICD-10-CM | POA: Diagnosis not present

## 2014-01-23 DIAGNOSIS — M531 Cervicobrachial syndrome: Secondary | ICD-10-CM | POA: Diagnosis not present

## 2014-01-27 DIAGNOSIS — M531 Cervicobrachial syndrome: Secondary | ICD-10-CM | POA: Diagnosis not present

## 2014-01-27 DIAGNOSIS — M9981 Other biomechanical lesions of cervical region: Secondary | ICD-10-CM | POA: Diagnosis not present

## 2014-01-27 DIAGNOSIS — M999 Biomechanical lesion, unspecified: Secondary | ICD-10-CM | POA: Diagnosis not present

## 2014-02-03 DIAGNOSIS — M9981 Other biomechanical lesions of cervical region: Secondary | ICD-10-CM | POA: Diagnosis not present

## 2014-02-03 DIAGNOSIS — M999 Biomechanical lesion, unspecified: Secondary | ICD-10-CM | POA: Diagnosis not present

## 2014-02-03 DIAGNOSIS — M531 Cervicobrachial syndrome: Secondary | ICD-10-CM | POA: Diagnosis not present

## 2014-02-06 DIAGNOSIS — M9981 Other biomechanical lesions of cervical region: Secondary | ICD-10-CM | POA: Diagnosis not present

## 2014-02-06 DIAGNOSIS — M999 Biomechanical lesion, unspecified: Secondary | ICD-10-CM | POA: Diagnosis not present

## 2014-02-06 DIAGNOSIS — M531 Cervicobrachial syndrome: Secondary | ICD-10-CM | POA: Diagnosis not present

## 2014-02-10 DIAGNOSIS — M999 Biomechanical lesion, unspecified: Secondary | ICD-10-CM | POA: Diagnosis not present

## 2014-02-10 DIAGNOSIS — M531 Cervicobrachial syndrome: Secondary | ICD-10-CM | POA: Diagnosis not present

## 2014-02-10 DIAGNOSIS — M9981 Other biomechanical lesions of cervical region: Secondary | ICD-10-CM | POA: Diagnosis not present

## 2014-03-18 ENCOUNTER — Other Ambulatory Visit: Payer: Self-pay | Admitting: Family Medicine

## 2014-03-19 ENCOUNTER — Ambulatory Visit (INDEPENDENT_AMBULATORY_CARE_PROVIDER_SITE_OTHER): Payer: Medicare Other | Admitting: Family Medicine

## 2014-03-19 VITALS — BP 128/74 | HR 88 | Temp 97.8°F | Resp 16 | Ht 67.0 in | Wt 197.0 lb

## 2014-03-19 DIAGNOSIS — H579 Unspecified disorder of eye and adnexa: Secondary | ICD-10-CM | POA: Diagnosis not present

## 2014-03-19 DIAGNOSIS — R05 Cough: Secondary | ICD-10-CM

## 2014-03-19 DIAGNOSIS — R059 Cough, unspecified: Secondary | ICD-10-CM

## 2014-03-19 DIAGNOSIS — R0789 Other chest pain: Secondary | ICD-10-CM | POA: Diagnosis not present

## 2014-03-19 DIAGNOSIS — R6889 Other general symptoms and signs: Secondary | ICD-10-CM

## 2014-03-19 MED ORDER — DOXYCYCLINE HYCLATE 100 MG PO CAPS: 100.0000 mg | ORAL_CAPSULE | Freq: Two times a day (BID) | ORAL | Status: DC

## 2014-03-19 NOTE — Patient Instructions (Addendum)
You have been diagnosed with bronchitis.  Please take the antibiotic for its entire course.    You also have seasonal allergies.  Please take Claritin (generic is ok) over the counter every day in the morning throughout allergy season.  Continue to use the flonase daily.    If you do not get better in the next 2 weeks or if you start to have fevers please let us know.
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
## 2014-04-21 ENCOUNTER — Ambulatory Visit: Payer: Medicare Other | Admitting: Family Medicine

## 2014-04-23 ENCOUNTER — Emergency Department (HOSPITAL_COMMUNITY)
Admission: EM | Admit: 2014-04-23 | Discharge: 2014-04-23 | Disposition: A | Discharge status: Home / Self Care | Payer: Medicare Other | Attending: Emergency Medicine | Admitting: Emergency Medicine

## 2014-04-23 ENCOUNTER — Emergency Department (HOSPITAL_COMMUNITY): Payer: Medicare Other

## 2014-04-23 ENCOUNTER — Encounter (HOSPITAL_COMMUNITY): Payer: Self-pay | Admitting: Emergency Medicine

## 2014-04-23 DIAGNOSIS — IMO0002 Reserved for concepts with insufficient information to code with codable children: Secondary | ICD-10-CM | POA: Diagnosis not present

## 2014-04-23 DIAGNOSIS — Z79899 Other long term (current) drug therapy: Secondary | ICD-10-CM | POA: Diagnosis not present

## 2014-04-23 DIAGNOSIS — R1084 Generalized abdominal pain: Secondary | ICD-10-CM | POA: Diagnosis not present

## 2014-04-23 DIAGNOSIS — Z87891 Personal history of nicotine dependence: Secondary | ICD-10-CM | POA: Diagnosis not present

## 2014-04-23 DIAGNOSIS — Z9089 Acquired absence of other organs: Secondary | ICD-10-CM | POA: Insufficient documentation

## 2014-04-23 DIAGNOSIS — R110 Nausea: Secondary | ICD-10-CM | POA: Diagnosis not present

## 2014-04-23 DIAGNOSIS — R109 Unspecified abdominal pain: Secondary | ICD-10-CM | POA: Diagnosis not present

## 2014-04-23 DIAGNOSIS — Z7982 Long term (current) use of aspirin: Secondary | ICD-10-CM | POA: Diagnosis not present

## 2014-04-23 DIAGNOSIS — N200 Calculus of kidney: Secondary | ICD-10-CM | POA: Diagnosis not present

## 2014-04-23 LAB — CBC WITH DIFFERENTIAL/PLATELET
BASOS ABS: 0 10*3/uL (ref 0.0–0.1)
BASOS PCT: 0 % (ref 0–1)
EOS ABS: 0.1 10*3/uL (ref 0.0–0.7)
EOS PCT: 2 % (ref 0–5)
HCT: 46.3 % (ref 39.0–52.0)
Hemoglobin: 16.9 g/dL (ref 13.0–17.0)
LYMPHS ABS: 1.4 10*3/uL (ref 0.7–4.0)
Lymphocytes Relative: 21 % (ref 12–46)
MCH: 31.9 pg (ref 26.0–34.0)
MCHC: 36.5 g/dL — AB (ref 30.0–36.0)
MCV: 87.5 fL (ref 78.0–100.0)
Monocytes Absolute: 0.8 10*3/uL (ref 0.1–1.0)
Monocytes Relative: 11 % (ref 3–12)
NEUTROS PCT: 66 % (ref 43–77)
Neutro Abs: 4.4 10*3/uL (ref 1.7–7.7)
PLATELETS: 165 10*3/uL (ref 150–400)
RBC: 5.29 MIL/uL (ref 4.22–5.81)
RDW: 12.5 % (ref 11.5–15.5)
WBC: 6.7 10*3/uL (ref 4.0–10.5)

## 2014-04-23 LAB — URINALYSIS, ROUTINE W REFLEX MICROSCOPIC
BILIRUBIN URINE: NEGATIVE
Glucose, UA: NEGATIVE mg/dL
Hgb urine dipstick: NEGATIVE
Ketones, ur: NEGATIVE mg/dL
LEUKOCYTES UA: NEGATIVE
Nitrite: NEGATIVE
PH: 5.5 (ref 5.0–8.0)
PROTEIN: 30 mg/dL — AB
Specific Gravity, Urine: 1.015 (ref 1.005–1.030)
UROBILINOGEN UA: 0.2 mg/dL (ref 0.0–1.0)

## 2014-04-23 LAB — COMPREHENSIVE METABOLIC PANEL
ALBUMIN: 3.9 g/dL (ref 3.5–5.2)
ALK PHOS: 69 U/L (ref 39–117)
ALT: 30 U/L (ref 0–53)
AST: 25 U/L (ref 0–37)
BUN: 16 mg/dL (ref 6–23)
CALCIUM: 8.8 mg/dL (ref 8.4–10.5)
CO2: 23 mEq/L (ref 19–32)
Chloride: 98 mEq/L (ref 96–112)
Creatinine, Ser: 1.07 mg/dL (ref 0.50–1.35)
GFR calc Af Amer: 78 mL/min — ABNORMAL LOW (ref >90)
GFR calc non Af Amer: 67 mL/min — ABNORMAL LOW (ref >90)
Glucose, Bld: 127 mg/dL — ABNORMAL HIGH (ref 70–99)
POTASSIUM: 3.8 meq/L (ref 3.7–5.3)
SODIUM: 137 meq/L (ref 137–147)
TOTAL PROTEIN: 7.6 g/dL (ref 6.0–8.3)
Total Bilirubin: 0.7 mg/dL (ref 0.3–1.2)

## 2014-04-23 LAB — URINE MICROSCOPIC-ADD ON

## 2014-04-23 LAB — LIPASE, BLOOD: Lipase: 14 U/L (ref 11–59)

## 2014-04-23 MED ORDER — IOHEXOL 300 MG/ML  SOLN
50.0000 mL | Freq: Once | INTRAMUSCULAR | Status: AC | PRN
  Administered 2014-04-23: 50 mL via ORAL

## 2014-04-23 MED ORDER — KETOROLAC TROMETHAMINE 30 MG/ML IJ SOLN
30.0000 mg | Freq: Once | INTRAMUSCULAR | Status: AC
  Administered 2014-04-23: 30 mg via INTRAVENOUS
  Filled 2014-04-23: qty 1

## 2014-04-23 MED ORDER — ONDANSETRON 8 MG PO TBDP: 8.0000 mg | ORAL_TABLET | Freq: Three times a day (TID) | ORAL | Status: DC | PRN

## 2014-04-23 MED ORDER — MORPHINE SULFATE 4 MG/ML IJ SOLN
4.0000 mg | Freq: Once | INTRAMUSCULAR | Status: AC
  Administered 2014-04-23: 4 mg via INTRAVENOUS
  Filled 2014-04-23: qty 1

## 2014-04-23 MED ORDER — SODIUM CHLORIDE 0.9 % IV SOLN
1000.0000 mL | Freq: Once | INTRAVENOUS | Status: AC
  Administered 2014-04-23: 1000 mL via INTRAVENOUS

## 2014-04-23 MED ORDER — SODIUM CHLORIDE 0.9 % IV SOLN: 1000.0000 mL | INTRAVENOUS | Status: DC

## 2014-04-23 MED ORDER — IOHEXOL 300 MG/ML  SOLN
100.0000 mL | Freq: Once | INTRAMUSCULAR | Status: AC | PRN
  Administered 2014-04-23: 100 mL via INTRAVENOUS

## 2014-04-23 MED ORDER — ONDANSETRON HCL 4 MG/2ML IJ SOLN
4.0000 mg | Freq: Once | INTRAMUSCULAR | Status: AC
  Administered 2014-04-23: 4 mg via INTRAVENOUS
  Filled 2014-04-23: qty 2

## 2014-04-23 NOTE — ED Notes (Signed)
Per pt, states abdominal pain since yesterday, no N/V-slight loose stool this am-stated symptoms occurred after eating dinner last night

## 2014-04-23 NOTE — ED Notes (Signed)
Pt has never had morphine before, will wait 20 minutes before discharging.

## 2014-04-23 NOTE — ED Notes (Signed)
Pt called RN into room c/o itching at IV site and red marks up arms. Fluids hanging to flush IV.

## 2014-04-23 NOTE — ED Provider Notes (Signed)
CSN: [id number]     Arrival date & time 04/23/14  1150 History   First MD Initiated Contact with Patient 04/23/14 1234     Chief Complaint  Patient presents with  . Abdominal Pain      HPI Ports nausea and loose stools this morning.  Also reports some right-sided abdominal discomfort.  Patient is status post appendectomy.  He reports mild right-sided periumbilical discomfort.  The pain seems to wax and wane.  No right flank pain.  No fevers or chills.  No recent sick contacts.  He suspects this was secondary to food.  His major complaint however is the moderate severity of pain in the right side of his abdomen.  He's never had pain like this before.   Past Medical History  Diagnosis Date  . No pertinent past medical history    Past Surgical History  Procedure Laterality Date  . No past surgeries    . Laparoscopic appendectomy  06/21/2012    Procedure: APPENDECTOMY LAPAROSCOPIC;  Surgeon: Kandis Cocking, MD;  Location: [facility];  Service: General;  Laterality: N/A;  . Appendectomy     Family History  Problem Relation Age of Onset  . Stroke Mother    History  Substance Use Topics  . Smoking status: Former Smoker -- 20 years    Quit date: 06/22/1995  . Smokeless tobacco: Never Used  . Alcohol Use: No    Review of Systems  All other systems reviewed and are negative.     Allergies  No known allergies  Home Medications   Prior to Admission medications   Medication Sig Start Date End Date Taking? Authorizing Provider  acetaminophen (TYLENOL) 500 MG tablet Take 500 mg by mouth every 6 (six) hours as needed for pain.   Yes Historical Provider, MD  aspirin 81 MG chewable tablet Chew 81 mg by mouth as needed.    Yes Historical Provider, MD  b complex vitamins tablet Take 1 tablet by mouth every other day.   Yes Historical Provider, MD  calcium carbonate (OS-CAL) 600 MG TABS Take 600 mg by mouth every other day.   Yes Historical Provider, MD  Cholecalciferol (VITAMIN D) 400  UNITS capsule Take 400 Units by mouth daily.   Yes Historical Provider, MD  fluticasone (FLONASE) 50 MCG/ACT nasal spray Place 2 sprays into both nostrils daily. PATIENT NEEDS OFFICE VISIT FOR ADDITIONAL REFILLS 03/18/14  Yes Morrell Riddle, PA-C  Garlic 1000 MG CAPS Take 1 capsule by mouth every other day.   Yes Historical Provider, MD  magnesium 30 MG tablet Take 30 mg by mouth every other day.   Yes Historical Provider, MD  Multiple Vitamin (MULTIVITAMIN WITH MINERALS) TABS Take 1 tablet by mouth daily.   Yes Historical Provider, MD  Potassium 95 MG TABS Take 1 tablet by mouth every other day.   Yes Historical Provider, MD  vitamin C (ASCORBIC ACID) 500 MG tablet Take 500 mg by mouth daily.   Yes Historical Provider, MD  ondansetron (ZOFRAN ODT) 8 MG disintegrating tablet Take 1 tablet (8 mg total) by mouth every 8 (eight) hours as needed for nausea or vomiting. 04/23/14   Lyanne Co, MD   BP 130/79  Pulse 86  Temp(Src) 98 F (36.7 C) (Oral)  Resp 20  SpO2 96% Physical Exam  Nursing note and vitals reviewed. Constitutional: He is oriented to person, place, and time. He appears well-developed and well-nourished.  HENT:  Head: Normocephalic and atraumatic.  Eyes: EOM are normal.  Neck: Normal range of motion.  Cardiovascular: Normal rate, regular rhythm, normal heart sounds and intact distal pulses.   Pulmonary/Chest: Effort normal and breath sounds normal. No respiratory distress.  Abdominal: Soft. He exhibits no distension.  Mild right-sided generalized abdominal tenderness without guarding or rebound.  No specific right upper quadrant tenderness.  Negative Murphy sign  Musculoskeletal: Normal range of motion.  Neurological: He is alert and oriented to person, place, and time.  Skin: Skin is warm and dry.  Psychiatric: He has a normal mood and affect. Judgment normal.    ED Course  Procedures (including critical care time) Labs Review Labs Reviewed  CBC WITH DIFFERENTIAL -  Abnormal; Notable for the following:    MCHC 36.5 (*)    All other components within normal limits  COMPREHENSIVE METABOLIC PANEL - Abnormal; Notable for the following:    Glucose, Bld 127 (*)    GFR calc non Af Amer 67 (*)    GFR calc Af Amer 78 (*)    All other components within normal limits  URINALYSIS, ROUTINE W REFLEX MICROSCOPIC - Abnormal; Notable for the following:    Protein, ur 30 (*)    All other components within normal limits  LIPASE, BLOOD  URINE MICROSCOPIC-ADD ON    Imaging Review Ct Abdomen Pelvis W Contrast  04/23/2014   CLINICAL DATA:  Diffuse abdominal pain and loose stools for 1 day  EXAM: CT ABDOMEN AND PELVIS WITH CONTRAST  TECHNIQUE: Multidetector CT imaging of the abdomen and pelvis was performed using the standard protocol following bolus administration of intravenous contrast.  CONTRAST:  50mL OMNIPAQUE IOHEXOL 300 MG/ML SOLN, 100mL OMNIPAQUE IOHEXOL 300 MG/ML SOLN  COMPARISON:  CT abdomen pelvis 06/21/2012  FINDINGS: Lung bases are clear aside from mild dependent atelectasis.  A heterogeneous 1.6 x 1.7 cm upper pole right renal cystic lesion is stable compared to the CT of 06/21/2012. 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 Bilateral renal cysts appear similar to prior study. 3 mm nonobstructing left upper pole stone noted. There is no hydronephrosis. Both ureters are normal in caliber. No ureteral stone is identified.  The liver, gallbladder, spleen, adrenal glands, and pancreas are within normal limits and stable.  There are scattered atherosclerotic calcification of the normal caliber abdominal aorta and proximal iliac vasculature.  Stomach is well distended with oral contrast and is normal. There is a small polypoid, approximately 8 mm, filling defect within the mid duodenum near the junction of second and third portions, that appears stable compared to prior CT. Seen on image number 41 of the current study and image number 50 prior study.  Small bowel loops are normal in caliber and wall thickness. Terminal ileum appears within normal limits. There is high density near the cecum, likely surgical clips related to prior appendectomy. Appendix not visualized. The colon contains liquid stool. There is uncomplicated sigmoid colon diverticulosis. No adjacent inflammatory changes. No CT evidence of colitis. Rectum normal. Prostate gland prominent in size. Urinary bladder normal.  Negative for lymphadenopathy, ascites, or free air.  There is disc height narrowing and endplate irregularity at L4-L5, similar to prior exam. No evidence of fracture suspicious osseous lesion.  IMPRESSION: 1. No definite acute findings identified in the abdomen or pelvis. Liquid stool is noted in the colon, compatible with reported diarrhea. No evidence of colitis. 2. Stable mildly complex 1.7 cm right for pole cystic renal lesion. This appears unchanged compared to CT 06/21/2012, at which time it was characterized as  a Bosniak III renal lesion. 3. Sigmoid colon diverticulosis, without acute complicating features. 4. Bilateral simple renal cysts. 5. Single 3 mm nonobstructing upper pole left renal stone.   Electronically Signed   By: Britta MccreedySusan  Turner M.D.   On: 04/23/2014 15:21  I personally reviewed the imaging tests through PACS system I reviewed available ER/hospitalization records through the EMR    EKG Interpretation None      MDM   Final diagnoses:  Nausea  Abdominal pain    Patient feels better after some pain medication emergency department.  Discharge home in good condition.  Tolerating oral fluids.  Labs and CT scan without significant abnormalities.  PCP followup.  He understands to return to the ER for new or worsening symptoms    Lyanne CoKevin M Arlynn Stare, MD 04/23/14 [phone]

## 2014-04-23 NOTE — Discharge Instructions (Signed)
Abdominal Pain, Adult °Many things can cause abdominal pain. Usually, abdominal pain is not caused by a disease and will improve without treatment. It can often be observed and treated at home. Your health care provider will do a physical exam and possibly order blood tests and X-rays to help determine the seriousness of your pain. However, in many cases, more time must pass before a clear cause of the pain can be found. Before that point, your health care provider may not know if you need more testing or further treatment. °HOME CARE INSTRUCTIONS  °Monitor your abdominal pain for any changes. The following actions may help to alleviate any discomfort you are experiencing: °· Only take over-the-counter or prescription medicines as directed by your health care provider. °· Do not take laxatives unless directed to do so by your health care provider. °· Try a clear liquid diet (broth, tea, or water) as directed by your health care provider. Slowly move to a bland diet as tolerated. °SEEK MEDICAL CARE IF: °· You have unexplained abdominal pain. °· You have abdominal pain associated with nausea or diarrhea. °· You have pain when you urinate or have a bowel movement. °· You experience abdominal pain that wakes you in the night. °· You have abdominal pain that is worsened or improved by eating food. °· You have abdominal pain that is worsened with eating fatty foods. °SEEK IMMEDIATE MEDICAL CARE IF:  °· Your pain does not go away within 2 hours. °· You have a fever. °· You keep throwing up (vomiting). °· Your pain is felt only in portions of the abdomen, such as the right side or the left lower portion of the abdomen. °· You pass bloody or black tarry stools. °MAKE SURE YOU: °· Understand these instructions.   °· Will watch your condition.   °· Will get help right away if you are not doing well or get worse.   °Document Released: 08/31/2005 Document Revised: 09/11/2013 Document Reviewed: 07/31/2013 °[employer]® Patient  Information ©2014 [employer]. ° °

## 2014-07-01 DIAGNOSIS — Z0271 Encounter for disability determination: Secondary | ICD-10-CM

## 2014-07-22 DIAGNOSIS — Z0271 Encounter for disability determination: Secondary | ICD-10-CM

## 2014-09-08 ENCOUNTER — Ambulatory Visit (INDEPENDENT_AMBULATORY_CARE_PROVIDER_SITE_OTHER): Payer: Medicare Other | Admitting: Internal Medicine

## 2014-09-08 ENCOUNTER — Ambulatory Visit (INDEPENDENT_AMBULATORY_CARE_PROVIDER_SITE_OTHER): Payer: Medicare Other

## 2014-09-08 VITALS — BP 120/80 | HR 63 | Temp 97.9°F | Resp 16 | Ht 67.0 in | Wt 195.0 lb

## 2014-09-08 DIAGNOSIS — R51 Headache: Secondary | ICD-10-CM | POA: Diagnosis not present

## 2014-09-08 DIAGNOSIS — R0981 Nasal congestion: Secondary | ICD-10-CM

## 2014-09-08 DIAGNOSIS — J018 Other acute sinusitis: Secondary | ICD-10-CM

## 2014-09-08 DIAGNOSIS — R519 Headache, unspecified: Secondary | ICD-10-CM

## 2014-09-08 LAB — POCT CBC
GRANULOCYTE PERCENT: 55.4 % (ref 37–80)
HEMATOCRIT: 48.2 % (ref 43.5–53.7)
Hemoglobin: 16 g/dL (ref 14.1–18.1)
LYMPH, POC: 2.8 (ref 0.6–3.4)
MCH, POC: 31.1 pg (ref 27–31.2)
MCHC: 33.3 g/dL (ref 31.8–35.4)
MCV: 93.3 fL (ref 80–97)
MID (CBC): 0.7 (ref 0–0.9)
MPV: 7.2 fL (ref 0–99.8)
PLATELET COUNT, POC: 181 10*3/uL (ref 142–424)
POC GRANULOCYTE: 4.3 (ref 2–6.9)
POC LYMPH PERCENT: 36.1 %L (ref 10–50)
POC MID %: 8.5 % (ref 0–12)
RBC: 5.16 M/uL (ref 4.69–6.13)
RDW, POC: 13.3 %
WBC: 7.7 10*3/uL (ref 4.6–10.2)

## 2014-09-08 MED ORDER — PREDNISONE 10 MG PO TABS: ORAL_TABLET | ORAL | Status: DC

## 2014-09-08 MED ORDER — AMOXICILLIN 500 MG PO CAPS: 1000.0000 mg | ORAL_CAPSULE | Freq: Two times a day (BID) | ORAL | Status: DC

## 2014-09-08 NOTE — Patient Instructions (Signed)

## 2014-09-08 NOTE — Progress Notes (Signed)
   Subjective:    Patient ID: Matthew Meza, male    DOB: Jul 11, 1941, 73 y.o.   MRN: [id number]  HPI Patient is a 73 yo male who presents today with a sinus infection. Patient states he is able to breathe regularly, but notices a foul odor upon inhalation. Foul odor started yesterday. He also states he has pressure in his forehead and around eyes. That started yesterday. Other symptoms such as coughing and clearing of throat appeared last week. Patient also noticed a sore throat. He has taken Flonase with no relief. No chest pain or shortness of breath.     Review of Systems     Objective:   Physical Exam   [facility] reading (PRIMARY) by  Dr.Elexis Pollak cloudy maxillary, ethmoid and sphenoids  Results for orders placed in visit on 09/08/14  POCT CBC      Result Value Ref Range   WBC 7.7  4.6 - 10.2 K/uL   Lymph, poc 2.8  0.6 - 3.4   POC LYMPH PERCENT 36.1  10 - 50 %L   MID (cbc) 0.7  0 - 0.9   POC MID % 8.5  0 - 12 %M   POC Granulocyte 4.3  2 - 6.9   Granulocyte percent 55.4  37 - 80 %G   RBC 5.16  4.69 - 6.13 M/uL   Hemoglobin 16.0  14.1 - 18.1 g/dL   HCT, POC 40.948.2  81.143.5 - 53.7 %   MCV 93.3  80 - 97 fL   MCH, POC 31.1  27 - 31.2 pg   MCHC 33.3  31.8 - 35.4 g/dL   RDW, POC 91.413.3     Platelet Count, POC 181  142 - 424 K/uL   MPV 7.2  0 - 99.8 fL          Assessment & Plan:  Sinusitis/HA

## 2015-04-02 ENCOUNTER — Ambulatory Visit (INDEPENDENT_AMBULATORY_CARE_PROVIDER_SITE_OTHER): Payer: Medicare Other | Admitting: Physician Assistant

## 2015-04-02 ENCOUNTER — Encounter: Payer: Self-pay | Admitting: Physician Assistant

## 2015-04-02 VITALS — BP 144/79 | HR 64 | Temp 98.0°F | Resp 16 | Ht 68.0 in | Wt 200.0 lb

## 2015-04-02 DIAGNOSIS — E669 Obesity, unspecified: Secondary | ICD-10-CM | POA: Diagnosis not present

## 2015-04-02 DIAGNOSIS — Z131 Encounter for screening for diabetes mellitus: Secondary | ICD-10-CM

## 2015-04-02 DIAGNOSIS — Z1389 Encounter for screening for other disorder: Secondary | ICD-10-CM

## 2015-04-02 DIAGNOSIS — Z1329 Encounter for screening for other suspected endocrine disorder: Secondary | ICD-10-CM

## 2015-04-02 DIAGNOSIS — R030 Elevated blood-pressure reading, without diagnosis of hypertension: Secondary | ICD-10-CM | POA: Diagnosis not present

## 2015-04-02 DIAGNOSIS — Z13220 Encounter for screening for lipoid disorders: Secondary | ICD-10-CM | POA: Diagnosis not present

## 2015-04-02 DIAGNOSIS — IMO0001 Reserved for inherently not codable concepts without codable children: Secondary | ICD-10-CM

## 2015-04-02 DIAGNOSIS — Z130 Encounter for screening for diseases of the blood and blood-forming organs and certain disorders involving the immune mechanism: Secondary | ICD-10-CM | POA: Diagnosis not present

## 2015-04-02 DIAGNOSIS — K5901 Slow transit constipation: Secondary | ICD-10-CM

## 2015-04-02 LAB — LIPID PANEL
Cholesterol: 168 mg/dL (ref 0–200)
HDL: 25 mg/dL — AB (ref >=40)
LDL Cholesterol: 107 mg/dL — ABNORMAL HIGH (ref 0–99)
Total CHOL/HDL Ratio: 6.7 Ratio
Triglycerides: 179 mg/dL — ABNORMAL HIGH (ref <150)
VLDL: 36 mg/dL (ref 0–40)

## 2015-04-02 LAB — POCT URINALYSIS DIPSTICK
BILIRUBIN UA: NEGATIVE
Blood, UA: NEGATIVE
Glucose, UA: NEGATIVE
KETONES UA: NEGATIVE
LEUKOCYTES UA: NEGATIVE
Nitrite, UA: NEGATIVE
Protein, UA: 100
SPEC GRAV UA: 1.025
Urobilinogen, UA: 1
pH, UA: 5.5

## 2015-04-02 LAB — POCT UA - MICROSCOPIC ONLY
Bacteria, U Microscopic: NEGATIVE
CRYSTALS, UR, HPF, POC: NEGATIVE
Casts, Ur, LPF, POC: NEGATIVE
EPITHELIAL CELLS, URINE PER MICROSCOPY: NEGATIVE
Mucus, UA: NEGATIVE
RBC, urine, microscopic: NEGATIVE
WBC, Ur, HPF, POC: NEGATIVE
YEAST UA: NEGATIVE

## 2015-04-02 LAB — CBC WITH DIFFERENTIAL/PLATELET
Basophils Absolute: 0.1 10*3/uL (ref 0.0–0.1)
Basophils Relative: 1 % (ref 0–1)
EOS ABS: 0.3 10*3/uL (ref 0.0–0.7)
EOS PCT: 4 % (ref 0–5)
HCT: 44.8 % (ref 39.0–52.0)
Hemoglobin: 15.5 g/dL (ref 13.0–17.0)
Lymphocytes Relative: 31 % (ref 12–46)
Lymphs Abs: 2.3 10*3/uL (ref 0.7–4.0)
MCH: 31.3 pg (ref 26.0–34.0)
MCHC: 34.6 g/dL (ref 30.0–36.0)
MCV: 90.5 fL (ref 78.0–100.0)
MONOS PCT: 7 % (ref 3–12)
MPV: 10.3 fL (ref 8.6–12.4)
Monocytes Absolute: 0.5 10*3/uL (ref 0.1–1.0)
NEUTROS ABS: 4.3 10*3/uL (ref 1.7–7.7)
Neutrophils Relative %: 57 % (ref 43–77)
PLATELETS: 194 10*3/uL (ref 150–400)
RBC: 4.95 MIL/uL (ref 4.22–5.81)
RDW: 13.5 % (ref 11.5–15.5)
WBC: 7.5 10*3/uL (ref 4.0–10.5)

## 2015-04-02 LAB — COMPREHENSIVE METABOLIC PANEL
ALK PHOS: 60 U/L (ref 39–117)
ALT: 39 U/L (ref 0–53)
AST: 27 U/L (ref 0–37)
Albumin: 4.4 g/dL (ref 3.5–5.2)
BUN: 12 mg/dL (ref 6–23)
CALCIUM: 9.2 mg/dL (ref 8.4–10.5)
CO2: 30 mEq/L (ref 19–32)
Chloride: 101 mEq/L (ref 96–112)
Creat: 1.07 mg/dL (ref 0.50–1.35)
Glucose, Bld: 103 mg/dL — ABNORMAL HIGH (ref 70–99)
POTASSIUM: 4.1 meq/L (ref 3.5–5.3)
SODIUM: 140 meq/L (ref 135–145)
Total Bilirubin: 0.5 mg/dL (ref 0.2–1.2)
Total Protein: 7.1 g/dL (ref 6.0–8.3)

## 2015-04-02 LAB — VITAMIN B12: Vitamin B-12: 461 pg/mL (ref 211–911)

## 2015-04-02 LAB — POCT GLYCOSYLATED HEMOGLOBIN (HGB A1C): HEMOGLOBIN A1C: 6.2

## 2015-04-02 LAB — TSH: TSH: 4.113 u[IU]/mL (ref 0.350–4.500)

## 2015-04-02 MED ORDER — CVS PROBIOTIC (LACTOBACILLUS) PO CAPS: ORAL_CAPSULE | ORAL | Status: DC

## 2015-04-02 MED ORDER — PSYLLIUM 28 % PO PACK
1.0000 | PACK | Freq: Two times a day (BID) | ORAL | Status: DC
Start: 2015-04-02 — End: 2017-04-12

## 2015-04-02 NOTE — Progress Notes (Signed)
04/02/2015 at 2:15 PM  Matthew Meza / DOB: Aug 17, 1941 / MRN: [id number]  The patient has Seasonal allergic rhinitis on his problem list.  SUBJECTIVE  Chief complaint: Advice Only  Matthew Meza is a pleasant 74 y.o. male here today to "check everything."  He complains of constipation like symptoms that have been ongoing and would like to perform a clean out.  He does not have a specific reason for this, but states he simply wants to do it.  He denies belly pain, melena, hematochezia, GERD and reports he eats quite a bit of junk food and plans to change all that.  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    He takes quite a few supplements.  He denies any other health concerns today.  He does not want a colonoscopy.  He has quit smoking for 20 years now and drinks a "a beer or two once a month."   He  has a past medical history of No pertinent past medical history.    Medications reviewed and updated by myself where necessary, and exist elsewhere in the encounter.   Matthew Meza is allergic to no known allergies. He  reports that he quit smoking about 19 years ago. He has never used smokeless tobacco. He reports that he does not drink alcohol or use illicit drugs. He  has no sexual activity history on file. The patient  has past surgical history that includes No past surgeries; laparoscopic appendectomy (06/21/2012); and Appendectomy.  His family history includes Stroke in his mother.  Review of Systems  Constitutional: Negative.   HENT: Negative.   Eyes: Negative.   Respiratory: Negative.   Cardiovascular: Negative.   Gastrointestinal: Negative for heartburn, nausea, vomiting, abdominal pain, diarrhea, constipation, blood in stool and melena.       "Bloating"  Genitourinary: Negative.   Musculoskeletal: Negative.   Skin: Negative.   Neurological: Negative.   Psychiatric/Behavioral: Negative.      OBJECTIVE  His  height is  (1.727 m) and weight is 200 lb (90.719 kg). His temperature is 98 F (36.7 C). His blood pressure is 144/79 and his pulse is 64. His respiration is 16 and oxygen saturation is 98%.  The patient's body mass index is 30.42 kg/(m^2).  Physical Exam  Constitutional: He is oriented to person, place, and time. He appears well-developed and well-nourished. No distress.  HENT:  Right Ear: Hearing, tympanic membrane, external ear and ear canal normal.  Left Ear: Hearing, tympanic membrane, external ear and ear canal normal.  Nose: No mucosal edema or sinus tenderness. Right sinus exhibits no maxillary sinus tenderness and no frontal sinus tenderness. Left sinus exhibits no maxillary sinus tenderness and no frontal sinus tenderness.  Mouth/Throat: Uvula is midline and mucous membranes are normal. Mucous membranes are not pale, not dry and not cyanotic. No oropharyngeal exudate, posterior oropharyngeal edema, posterior oropharyngeal erythema or tonsillar abscesses.  Cardiovascular: Normal rate and regular rhythm.   Respiratory: Effort normal and breath sounds normal. He has no wheezes. He has no rales.  Musculoskeletal: Normal range of motion.  Lymphadenopathy:    He has no cervical adenopathy.  Neurological: He is alert and oriented to person, place, and time.  Skin: Skin is warm and dry. He is not diaphoretic.  Psychiatric: He has a normal mood and affect.    No results found for this or any previous visit (from  the past 24 hour(s)).  ASSESSMENT & PLAN  Marcy SalvoRaymond was seen today for advice only.  Diagnoses and all orders for this visit:  Screening for deficiency anemia Orders: -     CBC with Differential/Platelet -     Vitamin B12  Screening for nephropathy Orders: -     POCT urinalysis dipstick -     POCT UA - Microscopic Only -     Comprehensive metabolic panel  Screening for diabetes mellitus Orders: -     POCT glycosylated hemoglobin (Hb  A1C)  Screening for cholesterol level Orders: -     Lipid panel  Thyroid disorder screen Orders: -     TSH  Elevated BP  Slow transit constipation Orders: -     psyllium (METAMUCIL SMOOTH TEXTURE) 28 % packet; Take 1 packet by mouth 2 (two) times daily. -     Lactobacillus Rhamnosus, GG, ([employer] PROBIOTIC, LACTOBACILLUS,) CAPS; Use as directed on the bottle.  Obesity Orders: -     POCT glycosylated hemoglobin (Hb A1C) -     POCT urinalysis dipstick -     POCT UA - Microscopic Only -     CBC with Differential/Platelet -     Lipid panel -     TSH -     Comprehensive metabolic panel -     Vitamin B12 -     psyllium (METAMUCIL SMOOTH TEXTURE) 28 % packet; Take 1 packet by mouth 2 (two) times daily. -     Lactobacillus Rhamnosus, GG, ([employer] PROBIOTIC, LACTOBACILLUS,) CAPS; Use as directed on the bottle.    The patient was advised to call or come back to clinic if he does not see an improvement in symptoms, or worsens with the above plan.   Deliah BostonMichael Ailanie Ruttan, MHS, PA-C [facility] 04/02/2015 2:15 PM

## 2015-04-03 ENCOUNTER — Telehealth: Payer: Self-pay

## 2015-04-03 NOTE — Telephone Encounter (Signed)
Pt states that he had some blood work done by [employer], and that he is concerned now that Medicare will not cover this, and he can not afford it if they do not. He would like to know if he can have these tests candled. I am placing this on high priority due to it possibly being a time sensitive issue. Please advise

## 2015-04-03 NOTE — Telephone Encounter (Signed)
Please advise on cancelling labs.

## 2015-04-03 NOTE — Telephone Encounter (Signed)
The labs have already resulted.  Matthew Meza explained this to him in the clinic and he signed stating he understood.  Deliah BostonMichael Clark, MS, PA-C   11:37 AM, 04/03/2015

## 2015-04-21 DIAGNOSIS — M9903 Segmental and somatic dysfunction of lumbar region: Secondary | ICD-10-CM | POA: Diagnosis not present

## 2015-04-21 DIAGNOSIS — M62830 Muscle spasm of back: Secondary | ICD-10-CM | POA: Diagnosis not present

## 2015-04-21 DIAGNOSIS — M9902 Segmental and somatic dysfunction of thoracic region: Secondary | ICD-10-CM | POA: Diagnosis not present

## 2015-04-21 DIAGNOSIS — M9901 Segmental and somatic dysfunction of cervical region: Secondary | ICD-10-CM | POA: Diagnosis not present

## 2015-10-01 ENCOUNTER — Telehealth: Payer: Self-pay | Admitting: Family Medicine

## 2015-10-01 NOTE — Telephone Encounter (Signed)
PATIENT WILL CALL BACK TO SCHEDULE APPOINTMENT.  HE NEEDS TO CLOSE SOME GAPS.  COLONOSCOPY, FLU VACCINE, PNEUMONIA VACCINE AND BMI AND FOLLOW UP PLAN FOR BMI

## 2015-10-10 ENCOUNTER — Ambulatory Visit (INDEPENDENT_AMBULATORY_CARE_PROVIDER_SITE_OTHER): Payer: Medicare Other | Admitting: Family Medicine

## 2015-10-10 VITALS — BP 130/70 | HR 75 | Temp 97.8°F | Resp 18 | Ht 67.5 in | Wt 196.5 lb

## 2015-10-10 DIAGNOSIS — E669 Obesity, unspecified: Secondary | ICD-10-CM

## 2015-10-10 DIAGNOSIS — R7303 Prediabetes: Secondary | ICD-10-CM | POA: Diagnosis not present

## 2015-10-10 DIAGNOSIS — M1990 Unspecified osteoarthritis, unspecified site: Secondary | ICD-10-CM | POA: Diagnosis not present

## 2015-10-10 DIAGNOSIS — E786 Lipoprotein deficiency: Secondary | ICD-10-CM
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
## 2015-10-10 NOTE — Patient Instructions (Addendum)
Recommend colonoscopy  Recommend flu shot and pneumonia vaccines  Work hard on weight loss and exercise as discussed  I would discourage the bowel cleansing for general wellbeing  Return about yearly.

## 2015-11-24 DIAGNOSIS — M9903 Segmental and somatic dysfunction of lumbar region: Secondary | ICD-10-CM | POA: Diagnosis not present

## 2015-11-24 DIAGNOSIS — M9901 Segmental and somatic dysfunction of cervical region: Secondary | ICD-10-CM | POA: Diagnosis not present

## 2015-11-24 DIAGNOSIS — M62830 Muscle spasm of back: Secondary | ICD-10-CM | POA: Diagnosis not present

## 2015-11-24 DIAGNOSIS — M9902 Segmental and somatic dysfunction of thoracic region: Secondary | ICD-10-CM | POA: Diagnosis not present

## 2015-12-16 ENCOUNTER — Telehealth: Payer: Self-pay

## 2015-12-16 DIAGNOSIS — Z1211 Encounter for screening for malignant neoplasm of colon: Secondary | ICD-10-CM

## 2015-12-16 NOTE — Telephone Encounter (Signed)
Ok to refer.

## 2015-12-16 NOTE — Telephone Encounter (Signed)
Patient is requesting a referral to a gastroenterologist for a colonscopy, per Dr Frederik PearHopper's request.  Please advise, thank you.  CB#: [phone]

## 2015-12-19 NOTE — Telephone Encounter (Signed)
Okay to make the referral 

## 2015-12-20 NOTE — Telephone Encounter (Signed)
Referral made and lmom to pt

## 2016-08-10 ENCOUNTER — Ambulatory Visit (INDEPENDENT_AMBULATORY_CARE_PROVIDER_SITE_OTHER): Payer: Medicare Other | Admitting: Family Medicine

## 2016-08-10 VITALS — BP 106/84 | HR 81 | Temp 98.2°F | Resp 18 | Ht 67.0 in | Wt 195.8 lb

## 2016-08-10 DIAGNOSIS — I739 Peripheral vascular disease, unspecified: Secondary | ICD-10-CM | POA: Diagnosis not present

## 2016-08-10 DIAGNOSIS — N529 Male erectile dysfunction, unspecified: Secondary | ICD-10-CM

## 2016-08-10 DIAGNOSIS — E785 Hyperlipidemia, unspecified: Secondary | ICD-10-CM

## 2016-08-10 NOTE — Patient Instructions (Addendum)
It was good to see you today.  We'll refer you to the vascular surgeon and they'll set you up for an appointment.  We'll check some blood work today and let you know the results.   Start taking a baby aspirin daily until you see the vascular specialists.    IF you received an x-ray today, you will receive an invoice from [facility]. Please contact [facility] at [phone] with questions or concerns regarding your invoice.   IF you received labwork today, you will receive an invoice from [facility]. Please contact [facility] at [phone] with questions or concerns regarding your invoice.   Our billing staff will not be able to assist you with questions regarding bills from these companies.  You will be contacted with the lab results as soon as they are available. The fastest way to get your results is to activate your My Chart account. Instructions are located on the last page of this paperwork. If you have not heard from us regarding the results in 2 weeks, please contact this office.

## 2016-08-10 NOTE — Progress Notes (Signed)
Matthew Meza is a 75 y.o. male who presents to Urgent Care today for ED  1.  ED:  Main issue for today.  Difficulty obtaining erections.  Occasionally has AM erections, not consistently.  Not sexually active for about 12 years, but states "interested."  History of smoking, stopped twenty years ago.  Had been smoking for about 30 years prior to that.  Denies any chest pain, dyspnea, palpitations.  2.  Calf pain:  History of claudication type symptoms in past.  Told that he had "claudication."  States he could walk about 100 yards and then have to stop due to cramping pain BL calves.  This has improved -- but he is unable to walk as far as he used to previously secondary to what sounds like neuropathic pains bottom of his feet bilaterally.  Described as numbness pads of feet BL.  No chest pain.  As above, former smoker.    ROS as above.    PMH reviewed. Patient is a nonsmoker.   Past Medical History:  Diagnosis Date  . No pertinent past medical history    Past Surgical History:  Procedure Laterality Date  . APPENDECTOMY    . LAPAROSCOPIC APPENDECTOMY  06/21/2012   Procedure: APPENDECTOMY LAPAROSCOPIC;  Surgeon: Kandis Cockingavid H Newman, MD;  Location: [facility];  Service: General;  Laterality: N/A;  . NO PAST SURGERIES      Medications reviewed. Current Outpatient Prescriptions  Medication Sig Dispense Refill  . b complex vitamins tablet Take 1 tablet by mouth every other day.    . Cholecalciferol (VITAMIN D) 400 UNITS capsule Take 400 Units by mouth daily.    . Garlic 1000 MG CAPS Take 1 capsule by mouth every other day.    . Lactobacillus Rhamnosus, GG, ([employer] PROBIOTIC, LACTOBACILLUS,) CAPS Use as directed on the bottle. (Patient not taking: Reported on 10/10/2015) 30 capsule 6  . magnesium 30 MG tablet Take 30 mg by mouth every other day.    . Multiple Vitamin (MULTIVITAMIN WITH MINERALS) TABS Take 1 tablet by mouth daily.    . predniSONE (DELTASONE) 10 MG tablet 6-5-4-3-2-1 po pc for sinus  (Patient not taking: Reported on 10/10/2015) 21 tablet 0  . psyllium (METAMUCIL SMOOTH TEXTURE) 28 % packet Take 1 packet by mouth 2 (two) times daily. (Patient not taking: Reported on 10/10/2015) 30 packet 6   No current facility-administered medications for this visit.      Physical Exam:  BP 106/84 (BP Location: Right Arm, Patient Position: Sitting, Cuff Size: Large)   Pulse 81   Temp 98.2 F (36.8 C) (Oral)   Resp 18   Ht 5\' 7"  (1.702 m)   Wt 195 lb 12.8 oz (88.8 kg)   SpO2 96%   BMI 30.67 kg/m  Gen:  Alert, cooperative patient who appears stated age in no acute distress.  Vital signs reviewed. HEENT: EOMI,  MMM Pulm:  Clear to auscultation bilaterally with good air movement.  No wheezes or rales noted.   Cardiac:  Regular rate and rhythm.   Abd:  Soft/nondistended/nontender.   Exts: Trace LE edema  Has no hair BL below mid-shins.  +1 DP/PT pulses BL.    Neuro:  Sensastion intact BL feet and legs.    Assessment and Plan:  1.  Claudication: - Start ASA 81 mg. - sounds like this has improved recently.  I do fill BL pulses, but he has history of smoking and fairly strong history of claudication type symptoms.   - refer to  vascular for further evaluation   - checking CMET, lipids today.   2.  ED: - relatively hypotensive today compared to usual blood pressure.  - Hasn't had anything to eat or drink. Asymptomatic - Hold off on any ED meds until seen by vascular - FU here after being seen there.   3.  Neuropathy:   - present BL.   - sees a podiatrist. States "everything okay" according to podiatrist.     - Checking glucose today with CMET.   -

## 2016-08-11 LAB — COMPREHENSIVE METABOLIC PANEL
ALBUMIN: 4.6 g/dL (ref 3.6–5.1)
ALK PHOS: 71 U/L (ref 40–115)
ALT: 42 U/L (ref 9–46)
AST: 27 U/L (ref 10–35)
BUN: 14 mg/dL (ref 7–25)
CO2: 28 mmol/L (ref 20–31)
Calcium: 9.9 mg/dL (ref 8.6–10.3)
Chloride: 102 mmol/L (ref 98–110)
Creat: 1.16 mg/dL (ref 0.70–1.18)
Glucose, Bld: 112 mg/dL — ABNORMAL HIGH (ref 65–99)
POTASSIUM: 4.8 mmol/L (ref 3.5–5.3)
Sodium: 141 mmol/L (ref 135–146)
TOTAL PROTEIN: 7.8 g/dL (ref 6.1–8.1)
Total Bilirubin: 0.9 mg/dL (ref 0.2–1.2)

## 2016-08-11 LAB — LIPID PANEL
CHOLESTEROL: 186 mg/dL (ref 125–200)
HDL: 36 mg/dL — ABNORMAL LOW (ref >=40)
LDL Cholesterol: 107 mg/dL (ref <130)
TRIGLYCERIDES: 214 mg/dL — AB (ref <150)
Total CHOL/HDL Ratio: 5.2 Ratio — ABNORMAL HIGH (ref <=5.0)
VLDL: 43 mg/dL — AB (ref <30)

## 2016-08-11 LAB — CBC
HCT: 47.7 % (ref 38.5–50.0)
HEMOGLOBIN: 16.6 g/dL (ref 13.2–17.1)
MCH: 31.9 pg (ref 27.0–33.0)
MCHC: 34.8 g/dL (ref 32.0–36.0)
MCV: 91.7 fL (ref 80.0–100.0)
MPV: 10.3 fL (ref 7.5–12.5)
Platelets: 202 10*3/uL (ref 140–400)
RBC: 5.2 MIL/uL (ref 4.20–5.80)
RDW: 13.3 % (ref 11.0–15.0)
WBC: 8.3 10*3/uL (ref 3.8–10.8)

## 2016-08-12 ENCOUNTER — Telehealth: Payer: Self-pay | Admitting: Family Medicine

## 2016-08-12 NOTE — Telephone Encounter (Signed)
Called and spoke with patient to discuss the results of his lab tests.  Overall, everything looked good.  His triglycerides were a little elevated.  Discussed this, no questions.  Patient appreciative of call.

## 2016-08-18 ENCOUNTER — Other Ambulatory Visit: Payer: Self-pay

## 2016-08-18 DIAGNOSIS — I739 Peripheral vascular disease, unspecified: Secondary | ICD-10-CM

## 2016-09-13 ENCOUNTER — Encounter: Payer: Self-pay | Admitting: Surgery

## 2016-09-15 ENCOUNTER — Encounter: Payer: Self-pay | Admitting: Surgery

## 2016-09-19 ENCOUNTER — Encounter (HOSPITAL_COMMUNITY): Payer: Medicare Other

## 2016-09-19 ENCOUNTER — Encounter: Payer: Medicare Other | Admitting: Surgery

## 2017-03-31 ENCOUNTER — Other Ambulatory Visit: Payer: Self-pay

## 2017-03-31 ENCOUNTER — Inpatient Hospital Stay (HOSPITAL_COMMUNITY)
Admission: EM | Admit: 2017-03-31 | Discharge: 2017-04-12 | DRG: 023 | Disposition: A | Discharge status: Skilled Nursing Facility | Payer: Medicare HMO | Attending: Neurology | Admitting: Neurology

## 2017-03-31 ENCOUNTER — Encounter (HOSPITAL_COMMUNITY): Payer: Self-pay

## 2017-03-31 ENCOUNTER — Encounter (HOSPITAL_COMMUNITY)
Admission: EM | Disposition: A | Discharge status: Skilled Nursing Facility | Payer: Self-pay | Source: Home / Self Care | Attending: Neurology

## 2017-03-31 ENCOUNTER — Emergency Department (HOSPITAL_COMMUNITY): Payer: Medicare HMO

## 2017-03-31 ENCOUNTER — Emergency Department (HOSPITAL_COMMUNITY): Payer: Medicare HMO | Admitting: Anesthesiology

## 2017-03-31 ENCOUNTER — Inpatient Hospital Stay (HOSPITAL_COMMUNITY): Payer: Medicare HMO

## 2017-03-31 DIAGNOSIS — I129 Hypertensive chronic kidney disease with stage 1 through stage 4 chronic kidney disease, or unspecified chronic kidney disease: Secondary | ICD-10-CM | POA: Diagnosis present

## 2017-03-31 DIAGNOSIS — I63411 Cerebral infarction due to embolism of right middle cerebral artery: Principal | ICD-10-CM | POA: Diagnosis present

## 2017-03-31 DIAGNOSIS — D62 Acute posthemorrhagic anemia: Secondary | ICD-10-CM | POA: Diagnosis not present

## 2017-03-31 DIAGNOSIS — J189 Pneumonia, unspecified organism: Secondary | ICD-10-CM | POA: Diagnosis not present

## 2017-03-31 DIAGNOSIS — D72829 Elevated white blood cell count, unspecified: Secondary | ICD-10-CM

## 2017-03-31 DIAGNOSIS — Z6831 Body mass index (BMI) 31.0-31.9, adult: Secondary | ICD-10-CM

## 2017-03-31 DIAGNOSIS — E669 Obesity, unspecified: Secondary | ICD-10-CM | POA: Diagnosis present

## 2017-03-31 DIAGNOSIS — R531 Weakness: Secondary | ICD-10-CM | POA: Diagnosis present

## 2017-03-31 DIAGNOSIS — N390 Urinary tract infection, site not specified: Secondary | ICD-10-CM | POA: Diagnosis present

## 2017-03-31 DIAGNOSIS — R29810 Facial weakness: Secondary | ICD-10-CM | POA: Diagnosis present

## 2017-03-31 DIAGNOSIS — I63511 Cerebral infarction due to unspecified occlusion or stenosis of right middle cerebral artery: Secondary | ICD-10-CM

## 2017-03-31 DIAGNOSIS — Z823 Family history of stroke: Secondary | ICD-10-CM

## 2017-03-31 DIAGNOSIS — R1310 Dysphagia, unspecified: Secondary | ICD-10-CM | POA: Diagnosis present

## 2017-03-31 DIAGNOSIS — R471 Dysarthria and anarthria: Secondary | ICD-10-CM | POA: Diagnosis present

## 2017-03-31 DIAGNOSIS — Z87891 Personal history of nicotine dependence: Secondary | ICD-10-CM | POA: Diagnosis not present

## 2017-03-31 DIAGNOSIS — R509 Fever, unspecified: Secondary | ICD-10-CM

## 2017-03-31 DIAGNOSIS — G8194 Hemiplegia, unspecified affecting left nondominant side: Secondary | ICD-10-CM | POA: Diagnosis present

## 2017-03-31 DIAGNOSIS — N182 Chronic kidney disease, stage 2 (mild): Secondary | ICD-10-CM | POA: Diagnosis present

## 2017-03-31 DIAGNOSIS — R5383 Other fatigue: Secondary | ICD-10-CM | POA: Diagnosis not present

## 2017-03-31 DIAGNOSIS — R7303 Prediabetes: Secondary | ICD-10-CM | POA: Diagnosis present

## 2017-03-31 DIAGNOSIS — E785 Hyperlipidemia, unspecified: Secondary | ICD-10-CM | POA: Diagnosis present

## 2017-03-31 DIAGNOSIS — G9340 Encephalopathy, unspecified: Secondary | ICD-10-CM | POA: Diagnosis present

## 2017-03-31 DIAGNOSIS — T45615A Adverse effect of thrombolytic drugs, initial encounter: Secondary | ICD-10-CM | POA: Diagnosis not present

## 2017-03-31 DIAGNOSIS — I6521 Occlusion and stenosis of right carotid artery: Secondary | ICD-10-CM | POA: Diagnosis present

## 2017-03-31 DIAGNOSIS — L8990 Pressure ulcer of unspecified site, unspecified stage: Secondary | ICD-10-CM | POA: Insufficient documentation

## 2017-03-31 DIAGNOSIS — R0682 Tachypnea, not elsewhere classified: Secondary | ICD-10-CM

## 2017-03-31 DIAGNOSIS — I69391 Dysphagia following cerebral infarction: Secondary | ICD-10-CM

## 2017-03-31 DIAGNOSIS — E876 Hypokalemia: Secondary | ICD-10-CM | POA: Diagnosis not present

## 2017-03-31 DIAGNOSIS — L89812 Pressure ulcer of head, stage 2: Secondary | ICD-10-CM | POA: Diagnosis not present

## 2017-03-31 DIAGNOSIS — I10 Essential (primary) hypertension: Secondary | ICD-10-CM | POA: Diagnosis present

## 2017-03-31 DIAGNOSIS — I639 Cerebral infarction, unspecified: Secondary | ICD-10-CM

## 2017-03-31 DIAGNOSIS — I612 Nontraumatic intracerebral hemorrhage in hemisphere, unspecified: Secondary | ICD-10-CM | POA: Diagnosis not present

## 2017-03-31 DIAGNOSIS — R29711 NIHSS score 11: Secondary | ICD-10-CM | POA: Diagnosis present

## 2017-03-31 DIAGNOSIS — J9600 Acute respiratory failure, unspecified whether with hypoxia or hypercapnia: Secondary | ICD-10-CM | POA: Diagnosis not present

## 2017-03-31 DIAGNOSIS — J9601 Acute respiratory failure with hypoxia: Secondary | ICD-10-CM | POA: Diagnosis present

## 2017-03-31 DIAGNOSIS — E878 Other disorders of electrolyte and fluid balance, not elsewhere classified: Secondary | ICD-10-CM | POA: Diagnosis present

## 2017-03-31 DIAGNOSIS — I69322 Dysarthria following cerebral infarction: Secondary | ICD-10-CM

## 2017-03-31 DIAGNOSIS — I69354 Hemiplegia and hemiparesis following cerebral infarction affecting left non-dominant side: Secondary | ICD-10-CM

## 2017-03-31 DIAGNOSIS — I619 Nontraumatic intracerebral hemorrhage, unspecified: Secondary | ICD-10-CM | POA: Diagnosis not present

## 2017-03-31 DIAGNOSIS — Z978 Presence of other specified devices: Secondary | ICD-10-CM

## 2017-03-31 HISTORY — PX: IR PERCUTANEOUS ART THROMBECTOMY/INFUSION INTRACRANIAL INC DIAG ANGIO: IMG6087

## 2017-03-31 HISTORY — PX: IR ANGIO INTRA EXTRACRAN SEL COM CAROTID INNOMINATE UNI L MOD SED: IMG5358

## 2017-03-31 HISTORY — PX: IR INTRAVSC STENT CERV CAROTID W/O EMB-PROT MOD SED INC ANGIO: IMG2304

## 2017-03-31 HISTORY — PX: IR US GUIDE VASC ACCESS RIGHT: IMG2390

## 2017-03-31 HISTORY — PX: RADIOLOGY WITH ANESTHESIA: SHX6223

## 2017-03-31 LAB — COMPREHENSIVE METABOLIC PANEL
ALBUMIN: 4 g/dL (ref 3.5–5.0)
ALT: 29 U/L (ref 17–63)
ANION GAP: 13 (ref 5–15)
AST: 31 U/L (ref 15–41)
Alkaline Phosphatase: 61 U/L (ref 38–126)
BILIRUBIN TOTAL: 0.9 mg/dL (ref 0.3–1.2)
BUN: 10 mg/dL (ref 6–20)
CHLORIDE: 105 mmol/L (ref 101–111)
CO2: 21 mmol/L — AB (ref 22–32)
Calcium: 9.1 mg/dL (ref 8.9–10.3)
Creatinine, Ser: 1.25 mg/dL — ABNORMAL HIGH (ref 0.61–1.24)
GFR calc Af Amer: >60 mL/min (ref >60)
GFR calc non Af Amer: 55 mL/min — ABNORMAL LOW (ref >60)
GLUCOSE: 148 mg/dL — AB (ref 65–99)
POTASSIUM: 4 mmol/L (ref 3.5–5.1)
SODIUM: 139 mmol/L (ref 135–145)
TOTAL PROTEIN: 6.8 g/dL (ref 6.5–8.1)

## 2017-03-31 LAB — CBC
HCT: 46.3 % (ref 39.0–52.0)
Hemoglobin: 16 g/dL (ref 13.0–17.0)
MCH: 31.1 pg (ref 26.0–34.0)
MCHC: 34.6 g/dL (ref 30.0–36.0)
MCV: 90.1 fL (ref 78.0–100.0)
Platelets: 168 10*3/uL (ref 150–400)
RBC: 5.14 MIL/uL (ref 4.22–5.81)
RDW: 12.8 % (ref 11.5–15.5)
WBC: 11.5 10*3/uL — ABNORMAL HIGH (ref 4.0–10.5)

## 2017-03-31 LAB — GLUCOSE, CAPILLARY: GLUCOSE-CAPILLARY: 91 mg/dL (ref 65–99)

## 2017-03-31 LAB — APTT: APTT: 26 s (ref 24–36)

## 2017-03-31 LAB — PROTIME-INR
INR: 1.07
PROTHROMBIN TIME: 13.9 s (ref 11.4–15.2)

## 2017-03-31 LAB — I-STAT TROPONIN, ED: Troponin i, poc: 0 ng/mL (ref 0.00–0.08)

## 2017-03-31 LAB — I-STAT CHEM 8, ED
BUN: 11 mg/dL (ref 6–20)
Calcium, Ion: 1.09 mmol/L — ABNORMAL LOW (ref 1.15–1.40)
Chloride: 105 mmol/L (ref 101–111)
Creatinine, Ser: 1.1 mg/dL (ref 0.61–1.24)
Glucose, Bld: 151 mg/dL — ABNORMAL HIGH (ref 65–99)
HCT: 46 % (ref 39.0–52.0)
Hemoglobin: 15.6 g/dL (ref 13.0–17.0)
Potassium: 4 mmol/L (ref 3.5–5.1)
Sodium: 140 mmol/L (ref 135–145)
TCO2: 25 mmol/L (ref 0–100)

## 2017-03-31 LAB — DIFFERENTIAL
Basophils Absolute: 0 10*3/uL (ref 0.0–0.1)
Basophils Relative: 0 %
Eosinophils Absolute: 0.1 10*3/uL (ref 0.0–0.7)
Eosinophils Relative: 0 %
Lymphocytes Relative: 11 %
Lymphs Abs: 1.3 10*3/uL (ref 0.7–4.0)
Monocytes Absolute: 0.4 10*3/uL (ref 0.1–1.0)
Monocytes Relative: 3 %
Neutro Abs: 9.8 10*3/uL — ABNORMAL HIGH (ref 1.7–7.7)
Neutrophils Relative %: 86 %

## 2017-03-31 LAB — CBG MONITORING, ED: Glucose-Capillary: 140 mg/dL — ABNORMAL HIGH (ref 65–99)

## 2017-03-31 LAB — MRSA PCR SCREENING: MRSA by PCR: NEGATIVE

## 2017-03-31 SURGERY — RADIOLOGY WITH ANESTHESIA
Anesthesia: General

## 2017-03-31 MED ORDER — FAMOTIDINE IN NACL 20-0.9 MG/50ML-% IV SOLN
20.0000 mg | Freq: Two times a day (BID) | INTRAVENOUS | Status: DC
  Administered 2017-03-31 – 2017-04-07 (×14): 20 mg via INTRAVENOUS
  Filled 2017-03-31 (×16): qty 50

## 2017-03-31 MED ORDER — PROPOFOL 10 MG/ML IV BOLUS
INTRAVENOUS | Status: DC | PRN
  Administered 2017-03-31: 150 mg via INTRAVENOUS

## 2017-03-31 MED ORDER — ASPIRIN 325 MG PO TABS
ORAL_TABLET | ORAL | Status: AC | PRN
  Administered 2017-03-31: 325 mg via NASOGASTRIC

## 2017-03-31 MED ORDER — HEPARIN (PORCINE) IN NACL 100-0.45 UNIT/ML-% IJ SOLN
500.0000 [IU]/h | INTRAMUSCULAR | Status: DC
  Administered 2017-03-31: 500 [IU]/h via INTRAVENOUS
  Filled 2017-03-31: qty 250

## 2017-03-31 MED ORDER — ORAL CARE MOUTH RINSE
15.0000 mL | Freq: Four times a day (QID) | OROMUCOSAL | Status: DC
  Administered 2017-03-31 – 2017-04-02 (×6): 15 mL via OROMUCOSAL

## 2017-03-31 MED ORDER — ASPIRIN 325 MG PO TABS
ORAL_TABLET | ORAL | Status: AC
  Filled 2017-03-31: qty 1

## 2017-03-31 MED ORDER — PHENYLEPHRINE HCL 10 MG/ML IJ SOLN
INTRAVENOUS | Status: DC | PRN
  Administered 2017-03-31: 10 ug/min via INTRAVENOUS

## 2017-03-31 MED ORDER — ACETAMINOPHEN 325 MG PO TABS
650.0000 mg | ORAL_TABLET | ORAL | Status: DC | PRN
  Administered 2017-04-10: 650 mg via ORAL
  Filled 2017-03-31: qty 2

## 2017-03-31 MED ORDER — LIDOCAINE HCL (CARDIAC) 20 MG/ML IV SOLN
INTRAVENOUS | Status: DC | PRN
  Administered 2017-03-31: 60 mg via INTRAVENOUS

## 2017-03-31 MED ORDER — HYDROMORPHONE HCL 1 MG/ML IJ SOLN: 0.2500 mg | INTRAMUSCULAR | Status: DC | PRN

## 2017-03-31 MED ORDER — FENTANYL CITRATE (PF) 100 MCG/2ML IJ SOLN
INTRAMUSCULAR | Status: DC | PRN
  Administered 2017-03-31 (×2): 100 ug via INTRAVENOUS

## 2017-03-31 MED ORDER — ASPIRIN EC 81 MG PO TBEC
81.0000 mg | DELAYED_RELEASE_TABLET | Freq: Every day | ORAL | Status: DC
  Filled 2017-03-31: qty 1

## 2017-03-31 MED ORDER — PROPOFOL 500 MG/50ML IV EMUL
INTRAVENOUS | Status: DC | PRN
  Administered 2017-03-31: 75 ug/kg/min via INTRAVENOUS

## 2017-03-31 MED ORDER — CHLORHEXIDINE GLUCONATE 0.12% ORAL RINSE (MEDLINE KIT)
15.0000 mL | Freq: Two times a day (BID) | OROMUCOSAL | Status: DC
  Administered 2017-03-31 – 2017-04-02 (×4): 15 mL via OROMUCOSAL

## 2017-03-31 MED ORDER — IOPAMIDOL (ISOVUE-300) INJECTION 61%
INTRAVENOUS | Status: AC
  Administered 2017-03-31: 75 mL
  Filled 2017-03-31: qty 150

## 2017-03-31 MED ORDER — SODIUM CHLORIDE 0.9 % IV SOLN: 250.0000 mL | INTRAVENOUS | Status: DC | PRN

## 2017-03-31 MED ORDER — CLOPIDOGREL BISULFATE 75 MG PO TABS
ORAL_TABLET | ORAL | Status: AC | PRN
  Administered 2017-03-31: 300 mg via NASOGASTRIC

## 2017-03-31 MED ORDER — OXYCODONE HCL 5 MG PO TABS: 5.0000 mg | ORAL_TABLET | Freq: Once | ORAL | Status: DC | PRN

## 2017-03-31 MED ORDER — SODIUM CHLORIDE 0.9 % IV SOLN
INTRAVENOUS | Status: DC | PRN
  Administered 2017-03-31: 14:00:00 via INTRAVENOUS

## 2017-03-31 MED ORDER — ACETAMINOPHEN 160 MG/5ML PO SOLN
650.0000 mg | ORAL | Status: DC | PRN
  Administered 2017-04-07 – 2017-04-09 (×4): 650 mg
  Filled 2017-03-31 (×4): qty 20.3

## 2017-03-31 MED ORDER — ONDANSETRON HCL 4 MG/2ML IJ SOLN: 4.0000 mg | Freq: Four times a day (QID) | INTRAMUSCULAR | Status: DC | PRN

## 2017-03-31 MED ORDER — MIDAZOLAM HCL 2 MG/2ML IJ SOLN
2.0000 mg | INTRAMUSCULAR | Status: DC | PRN
  Administered 2017-03-31 – 2017-04-01 (×2): 2 mg via INTRAVENOUS
  Filled 2017-03-31 (×2): qty 2

## 2017-03-31 MED ORDER — ACETAMINOPHEN 650 MG RE SUPP: 650.0000 mg | RECTAL | Status: DC | PRN

## 2017-03-31 MED ORDER — GLYCOPYRROLATE 0.2 MG/ML IJ SOLN
INTRAMUSCULAR | Status: DC | PRN
  Administered 2017-03-31: 0.1 mg via INTRAVENOUS

## 2017-03-31 MED ORDER — OXYCODONE HCL 5 MG/5ML PO SOLN: 5.0000 mg | Freq: Once | ORAL | Status: DC | PRN

## 2017-03-31 MED ORDER — FENTANYL CITRATE (PF) 100 MCG/2ML IJ SOLN
50.0000 ug | INTRAMUSCULAR | Status: DC | PRN
  Administered 2017-03-31 – 2017-04-02 (×5): 50 ug via INTRAVENOUS
  Filled 2017-03-31 (×5): qty 2

## 2017-03-31 MED ORDER — NICARDIPINE HCL IN NACL 20-0.86 MG/200ML-% IV SOLN
0.0000 mg/h | INTRAVENOUS | Status: DC
  Administered 2017-03-31: 7.5 mg/h via INTRAVENOUS
  Administered 2017-03-31: 5 mg/h via INTRAVENOUS
  Administered 2017-03-31 (×2): 7.5 mg/h via INTRAVENOUS
  Administered 2017-04-01: 5.5 mg/h via INTRAVENOUS
  Administered 2017-04-01: 3.5 mg/h via INTRAVENOUS
  Administered 2017-04-01 (×4): 7.5 mg/h via INTRAVENOUS
  Filled 2017-03-31: qty 400
  Filled 2017-03-31 (×8): qty 200

## 2017-03-31 MED ORDER — SODIUM CHLORIDE 0.9 % IV SOLN
INTRAVENOUS | Status: DC
  Administered 2017-03-31 – 2017-04-09 (×6): via INTRAVENOUS

## 2017-03-31 MED ORDER — LACTATED RINGERS IV SOLN
INTRAVENOUS | Status: DC | PRN
  Administered 2017-03-31: 11:00:00 via INTRAVENOUS

## 2017-03-31 MED ORDER — IOPAMIDOL (ISOVUE-300) INJECTION 61%
INTRAVENOUS | Status: AC
  Administered 2017-03-31: 30 mL
  Filled 2017-03-31: qty 150

## 2017-03-31 MED ORDER — FENTANYL CITRATE (PF) 100 MCG/2ML IJ SOLN: 50.0000 ug | INTRAMUSCULAR | Status: DC | PRN

## 2017-03-31 MED ORDER — PROMETHAZINE HCL 25 MG/ML IJ SOLN: 6.2500 mg | INTRAMUSCULAR | Status: DC | PRN

## 2017-03-31 MED ORDER — CLOPIDOGREL BISULFATE 300 MG PO TABS
ORAL_TABLET | ORAL | Status: AC
  Filled 2017-03-31: qty 1

## 2017-03-31 MED ORDER — SUCCINYLCHOLINE CHLORIDE 20 MG/ML IJ SOLN
INTRAMUSCULAR | Status: DC | PRN
  Administered 2017-03-31: 120 mg via INTRAVENOUS

## 2017-03-31 MED ORDER — STROKE: EARLY STAGES OF RECOVERY BOOK
Freq: Once | Status: AC
  Administered 2017-03-31: 22:00:00
  Filled 2017-03-31 (×2): qty 1

## 2017-03-31 MED ORDER — CLOPIDOGREL BISULFATE 75 MG PO TABS
75.0000 mg | ORAL_TABLET | Freq: Every day | ORAL | Status: DC
  Filled 2017-03-31: qty 1

## 2017-03-31 MED ORDER — CEFAZOLIN SODIUM-DEXTROSE 2-3 GM-% IV SOLR
INTRAVENOUS | Status: DC | PRN
  Administered 2017-03-31: 2 g via INTRAVENOUS

## 2017-03-31 MED ORDER — MIDAZOLAM HCL 2 MG/2ML IJ SOLN: 2.0000 mg | INTRAMUSCULAR | Status: DC | PRN

## 2017-03-31 MED ORDER — SENNOSIDES 8.8 MG/5ML PO SYRP
5.0000 mL | ORAL_SOLUTION | Freq: Two times a day (BID) | ORAL | Status: DC | PRN
  Filled 2017-03-31: qty 5

## 2017-03-31 MED ORDER — ROCURONIUM BROMIDE 100 MG/10ML IV SOLN
INTRAVENOUS | Status: DC | PRN
  Administered 2017-03-31: 50 mg via INTRAVENOUS
  Administered 2017-03-31: 20 mg via INTRAVENOUS

## 2017-03-31 MED ORDER — CEFAZOLIN SODIUM-DEXTROSE 2-4 GM/100ML-% IV SOLN
INTRAVENOUS | Status: AC
  Filled 2017-03-31: qty 100

## 2017-03-31 NOTE — ED Provider Notes (Signed)
Pt seen other ED physician for Code Stroke. Pt to CT, then to IR. Never seen by myself in ER   Rolland Porter, MD 03/31/17 1459

## 2017-03-31 NOTE — Progress Notes (Signed)
ANTICOAGULATION CONSULT NOTE - Initial Consult  Pharmacy Consult for Heparin Indication: stroke  Allergies  Allergen Reactions  . No Known Allergies     Patient Measurements: Height: 5' 6.93" (170 cm) Weight: 189 lb 6 oz (85.9 kg) IBW/kg (Calculated) : 65.94 Heparin Dosing Weight: 83.5 kg  Vital Signs:    Labs:  Recent Labs  03/31/17 1040 03/31/17 1044  HGB 16.0 15.6  HCT 46.3 46.0  PLT 168  --   APTT 26  --   LABPROT 13.9  --   INR 1.07  --   CREATININE 1.25* 1.10    Estimated Creatinine Clearance: 60.7 mL/min (by C-G formula based on SCr of 1.1 mg/dL).   Medical History: Past Medical History:  Diagnosis Date  . No pertinent past medical history     Medications:   (Not in a hospital admission) Scheduled:  . aspirin      . clopidogrel      . iopamidol      . iopamidol       Infusions:  . ceFAZolin    . heparin      Assessment: 76yo male presents as code stroke and went to IR. Pharmacy is consulted to dose heparin for CVA s/p IR.  Goal of Therapy:  Heparin level 0.1-0.25 units/ml Monitor platelets by anticoagulation protocol: Yes   Plan:  Start heparin infusion at 500 units/hr Check anti-Xa level in 8 hours and daily while on heparin Continue to monitor H&H and platelets  Arlean Hopping. Newman Pies, PharmD, BCPS Clinical Pharmacist [phone] 03/31/2017,1:32 PM

## 2017-03-31 NOTE — H&P (Signed)
Neurology H&P  CC: Left-sided weakness  History is obtained from: EMS, patient  HPI: Matthew Meza is a 76 y.o. male who was last in his normal state of health definitely yesterday evening when he interacted with someone at his facility. 4 AM, he got up and went to the bathroom and felt normal and that time. When he tried it up for more to the bathroom, however, he fell and was unable to get up. He was then unable to get to a phone for about 6 hours and once he finally did, he called 911 and was brought in as a code stroke.   LKW: 4/26 evening tpa given?: no, outside of window Modified Rankin Score: 0  ROS: A 14 point ROS was performed and is negative except as noted in the HPI.   Past Medical History:  Diagnosis Date  . No pertinent past medical history   He denies any past medical history, does not take any medications   Family History  Problem Relation Age of Onset  . Stroke Mother      Social History:  reports that he quit smoking about 21 years ago. He quit after 20.00 years of use. He has never used smokeless tobacco. He reports that he does not drink alcohol or use drugs.   Exam: Current vital signs: BP 128/78   Ht 5' 6.93" (1.7 m)   Wt 86.5 kg (190 lb 11.2 oz)   BMI 29.93 kg/m  Vital signs in last 24 hours: BP: (128)/(78) 128/78 (04/27 1524) FiO2 (%):  [60 %] 60 % (04/27 1524) Weight:  [85.9 kg (189 lb 6 oz)-86.5 kg (190 lb 11.2 oz)] 86.5 kg (190 lb 11.2 oz) (04/27 1540)  Physical Exam  Constitutional: Appears well-developed and well-nourished.  Psych: Affect appropriate to situation Eyes: No scleral injection HENT: No OP obstrucion Head: Normocephalic.  Cardiovascular: Normal rate and regular rhythm.  Respiratory: Effort normal and breath sounds normal to anterior ascultation GI: Soft.  No distension. There is no tenderness.  Skin: WDI  Neuro: Mental Status: Patient is awake, alert, oriented to person, place, month, year, and situation. Patient is  able to give a clear and coherent history. No signs of aphasia, He does have left-sided neglect Cranial Nerves: II: Left hemianopia. Pupils are equal, round, and reactive to light.   III,IV, VI: Right gaze preference but is able to cross midline to the left V: Facial sensation is decreased on the left VII: Facial movement is with left facial droop VIII: hearing is intact to voice X: Uvula elevates symmetrically XI: Shoulder shrug is symmetric. XII: tongue is midline without atrophy or fasciculations.  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 Etiology is currently unclear, possibly dissection.  He was treated with IV heparin briefly given that there was a concern for dissection, but after carotid stenting was performed, but was no persistent flap and therefore dual antiplatelet therapy is being continued  Recommendations: 1. HgbA1c, fasting lipid panel 2. MRI of the brain without contrast 3. Frequent neuro checks 4. Echocardiogram 5. Carotid dopplers 6. Prophylactic  therapy-Antiplatelet med: Aspirin - dose  PO or  PR + plavix  daily 7. Risk factor modification 8. Telemetry monitoring 9. PT consult, OT consult, Speech consult 10. please page stroke NP  Or  PA  Or MD  from 8am -4 pm as this patient will be followed by the stroke team at this point.   You can look them up on [email or web address]   11. BP goal: 120-140 systolic   This patient is critically ill and at significant risk of neurological worsening,  death and care requires constant monitoring of vital signs, hemodynamics,respiratory and cardiac monitoring, neurological assessment, discussion with family, other specialists and medical decision making of high complexity. I spent 90 minutes of neurocritical care time  in the care of  this patient.  Ritta Slot, MD [facility] [phone]  If 7pm- 7am, please page neurology on call as listed in AMION. 03/31/2017  3:41 PM

## 2017-03-31 NOTE — Anesthesia Procedure Notes (Signed)
Procedure Name: Intubation Date/Time: 03/31/2017 11:19 AM Performed by: Suzy Bouchard Pre-anesthesia Checklist: Patient identified, Emergency Drugs available, Suction available and Patient being monitored Patient Re-evaluated:Patient Re-evaluated prior to inductionOxygen Delivery Method: Circle system utilized Preoxygenation: Pre-oxygenation with 100% oxygen Intubation Type: Rapid sequence and Cricoid Pressure applied Laryngoscope Size: Mac and 4 Grade View: Grade I Tube type: Oral Tube size: 7.5 mm Number of attempts: 1 Airway Equipment and Method: Stylet Placement Confirmation: ETT inserted through vocal cords under direct vision,  positive ETCO2 and breath sounds checked- equal and bilateral Secured at: 23 cm Tube secured with: Tape Dental Injury: Teeth and Oropharynx as per pre-operative assessment

## 2017-03-31 NOTE — ED Notes (Signed)
Pt transported to IR with RR nurse and stroke RN.

## 2017-03-31 NOTE — Sedation Documentation (Signed)
7 Fr. Exoseal to right groin 

## 2017-03-31 NOTE — ED Triage Notes (Signed)
Pt brought in by EMS for left sided weakness and garbled speech. Per EMS LSN was 4 a.m. Pt a&ox4.

## 2017-03-31 NOTE — Consult Note (Signed)
PULMONARY / CRITICAL CARE MEDICINE   Name: Matthew Meza MRN: [id number] DOB: [birth year]    ADMISSION DATE:  03/31/2017 CONSULTATION DATE:  03/31/17  REFERRING MD:  Dr. Amada Jupiter  CHIEF COMPLAINT:  acute respiratory failure  HISTORY OF PRESENT ILLNESS:   Mr. Police is a 76 year old man hospitalized for right MCA stroke. History was collected from chart review as the patient is intubated. He presented to ED with left sided weakness and garbled speech. He was last seen normal yesterday evening at his living facility. Around 4AM, he got up and went to the bathroom but fell and could not get up. Six hours later, he called 911 and was transported as code stroke. In the ED, CTA head/neck was notable for right ICA occlusion just beyond the origin and right ACA stenosis  He was transported to IR for revascularization. PCCM was consulted for vent management.    PAST MEDICAL HISTORY :  He  has a past medical history of No pertinent past medical history.  PAST SURGICAL HISTORY: He  has a past surgical history that includes No past surgeries; laparoscopic appendectomy (06/21/2012); and Appendectomy.  Allergies  Allergen Reactions  . No Known Allergies     No current facility-administered medications on file prior to encounter.    Current Outpatient Prescriptions on File Prior to Encounter  Medication Sig  . b complex vitamins tablet Take 1 tablet by mouth every other day.  . Cholecalciferol (VITAMIN D) 400 UNITS capsule Take 400 Units by mouth daily.  . Garlic 1000 MG CAPS Take 1 capsule by mouth every other day.  . Lactobacillus Rhamnosus, GG, ([employer] PROBIOTIC, LACTOBACILLUS,) CAPS Use as directed on the bottle. (Patient not taking: Reported on 10/10/2015)  . magnesium 30 MG tablet Take 30 mg by mouth every other day.  . Multiple Vitamin (MULTIVITAMIN WITH MINERALS) TABS Take 1 tablet by mouth daily.  . predniSONE (DELTASONE) 10 MG tablet 6-5-4-3-2-1 po pc for sinus (Patient not taking:  Reported on 10/10/2015)  . psyllium (METAMUCIL SMOOTH TEXTURE) 28 % packet Take 1 packet by mouth 2 (two) times daily. (Patient not taking: Reported on 10/10/2015)    FAMILY HISTORY:  His indicated that his mother is deceased. He indicated that his father is deceased. He indicated that all of his five sisters are alive. He indicated that his brother is alive. He indicated that his daughter is alive. He indicated that his son is deceased.    SOCIAL HISTORY: He  reports that he quit smoking about 21 years ago. He quit after 20.00 years of use. He has never used smokeless tobacco. He reports that he does not drink alcohol or use drugs.  VITAL SIGNS: BP 128/78   Ht 5' 6.93" (1.7 m)   Wt 85.9 kg (189 lb 6 oz)   BMI 29.72 kg/m   HEMODYNAMICS:    VENTILATOR SETTINGS: Vent Mode: PRVC FiO2 (%):  [60 %] 60 % Set Rate:  [12 bmp] 12 bmp Vt Set:  [650 mL] 650 mL PEEP:  [5 cmH20] 5 cmH20 Plateau Pressure:  [18 cmH20] 18 cmH20  INTAKE / OUTPUT: No intake/output data recorded.  PHYSICAL EXAMINATION: Physical Exam  Constitutional: No distress.  Elderly man  HENT:  Head: Normocephalic and atraumatic.  Cardiovascular: Normal rate and regular rhythm.   Pulmonary/Chest: Effort normal. No respiratory distress.  Abdominal: Soft. He exhibits no distension.  Neurological:  Intermittently moving RUE.   Skin: Skin is warm and dry. He is not diaphoretic.     LABS:  BMET  Recent Labs Lab 03/31/17 1040 03/31/17 1044  NA 139 140  K 4.0 4.0  CL 105 105  CO2 21*  --   BUN 10 11  CREATININE 1.25* 1.10  GLUCOSE 148* 151*    Electrolytes  Recent Labs Lab 03/31/17 1040  CALCIUM 9.1    CBC  Recent Labs Lab 03/31/17 1040 03/31/17 1044  WBC 11.5*  --   HGB 16.0 15.6  HCT 46.3 46.0  PLT 168  --     Coag's  Recent Labs Lab 03/31/17 1040  APTT 26  INR 1.07    Sepsis Markers No results for input(s): LATICACIDVEN, PROCALCITON, O2SATVEN in the last 168 hours.  ABG No  results for input(s): PHART, PCO2ART, PO2ART in the last 168 hours.  Liver Enzymes  Recent Labs Lab 03/31/17 1040  AST 31  ALT 29  ALKPHOS 61  BILITOT 0.9  ALBUMIN 4.0    Cardiac Enzymes No results for input(s): TROPONINI, PROBNP in the last 168 hours.  Glucose  Recent Labs Lab 03/31/17 1040  GLUCAP 140*    Imaging Ct Angio Head W Or Wo Contrast  Result Date: 03/31/2017 CLINICAL DATA:  76 year old male with left side weakness and evidence of right MCA emergent large vessel occlusion on noncontrast head CT. EXAM: CT ANGIOGRAPHY HEAD AND NECK CT PERFUSION BRAIN TECHNIQUE: Multidetector CT imaging of the head and neck was performed using the standard protocol during bolus administration of intravenous contrast. Multiplanar CT image reconstructions and MIPs were obtained to evaluate the vascular anatomy. Carotid stenosis measurements (when applicable) are obtained utilizing NASCET criteria, using the distal internal carotid diameter as the denominator. Multiphase CT imaging of the brain was performed following IV bolus contrast injection. Subsequent parametric perfusion maps were calculated using RAPID software. CONTRAST:  90 mL Isovue 370 COMPARISON:  Noncontrast head CT 1048 hours today. FINDINGS: CT Brain Perfusion Findings: CBF (<30%) Volume: 40 mL (using CBF less than 30%). Perfusion (Tmax>6.0s) volume: 185 mL (T-max greater than 6 seconds). Mismatch Volume: 146 mL (mismatch  ratio 4.6). Infarction Location:Right MCA Perfusion findings were reviewed in person with Dr. Ritta Slot at 1059 hours. CTA NECK Skeleton: No acute osseous abnormality identified. Age concordant degenerative changes in the cervical spine. Intermittent carious dentition. Upper chest: Negative; no superior mediastinal lymphadenopathy. There is calcified coronary artery atherosclerosis. Other neck: Negative thyroid, larynx, pharynx, parapharyngeal spaces, retropharyngeal space, sublingual space, submandibular  glands and parotid glands. No cervical lymphadenopathy. Aortic arch: Moderate soft and atherosclerotic arch atherosclerosis. Three vessel arch configuration. Right carotid system: No brachiocephalic artery or right CCA origin stenosis. The right carotid bifurcation is patent, but the right ICA origin is occluded abruptly 9 mm beyond its origin (series 10, image 39). The right ICA remains occluded to the skull base. Left carotid system: No left CCA origin stenosis. Medial soft plaque throughout the left CCA at the level of the thyroid, not hemodynamically significant. Soft and calcified plaque at the left ICA origin and bulb, not hemodynamically significant. Mid and distal cervical left ICA air negative. Vertebral arteries:No proximal right subclavian artery stenosis. Normal right vertebral artery origin. Negative cervical right vertebral artery. No proximal left subclavian artery stenosis despite soft plaque. Soft plaque at the left vertebral artery origin and V1 segment with only mild stenosis. Otherwise negative cervical left vertebral artery. CTA HEAD Posterior circulation: Codominant distal vertebral arteries without stenosis. Patent PICA origins. Patent vertebrobasilar junction. No basilar stenosis. Normal SCA and PCA origins. Posterior communicating arteries are diminutive or absent. Mild  bilateral P1 and P2 segment irregularity, no PCA stenosis identified. Anterior circulation: Left ICA siphon is patent with mild calcified plaque and no stenosis. Left ICA terminus, left MCA and left ACA origins are normal. Right ICA siphon is occluded through the cavernous segment. Beginning at the anterior genu there is reconstitution of the right ICA siphon, and the right ICA terminus is patent. The right MCA origin is patent but the mid M1 segment is occluded about 11 mm from the origin. There is minimal reconstituted enhancement in the proximal M2 segments. The right ACA origin is patent but diminutive and appears  somewhat stenotic (series 8, image 89). The left A1 segment may be dominant. A patent anterior communicating artery is identified. The bilateral ACA A2 segments and distal ACA branches appear symmetric and within normal limits. Venous sinuses: Patent. Anatomic variants: Possibly dominant left ACA A1 segment. Review of the MIP images confirms the above findings IMPRESSION: 1. Positive for emergent large vessel occlusion, and favorable CT perfusion findings for endovascular re-perfusion. 2. Occlusion of the Right ICA just beyond its origin. Reconstitution of the Right ICA terminus beginning at the anterior genu (perhaps via the right ophthalmic artery). But superimposed Right MCA mid M1 segment occlusion. 3. CT perfusion indicates a core infarct of 40 mL in the Right MCA territory and right hemisphere penumbra of 146 mL. 4. The above was relayed to Dr. Ritta Slot beginning at 1059 hours. 5. Stenosis of the right ACA origin, but the right A1 segment may be non dominant. Right A2 and distal ACA enhancement is within normal limits. Normal left MCA and left ACA. 6. Aortic arch and bilateral carotid atherosclerosis without additional stenosis. 7. Mild left vertebral artery origin stenosis due to soft plaque. Otherwise negative posterior circulation. 8. Calcified coronary artery atherosclerosis. Electronically Signed   By: Odessa Fleming M.D.   On: 03/31/2017 11:22   Ct Angio Neck W Or Wo Contrast  Result Date: 03/31/2017 CLINICAL DATA:  76 year old male with left side weakness and evidence of right MCA emergent large vessel occlusion on noncontrast head CT. EXAM: CT ANGIOGRAPHY HEAD AND NECK CT PERFUSION BRAIN TECHNIQUE: Multidetector CT imaging of the head and neck was performed using the standard protocol during bolus administration of intravenous contrast. Multiplanar CT image reconstructions and MIPs were obtained to evaluate the vascular anatomy. Carotid stenosis measurements (when applicable) are obtained  utilizing NASCET criteria, using the distal internal carotid diameter as the denominator. Multiphase CT imaging of the brain was performed following IV bolus contrast injection. Subsequent parametric perfusion maps were calculated using RAPID software. CONTRAST:  90 mL Isovue 370 COMPARISON:  Noncontrast head CT 1048 hours today. FINDINGS: CT Brain Perfusion Findings: CBF (<30%) Volume: 40 mL (using CBF less than 30%). Perfusion (Tmax>6.0s) volume: 185 mL (T-max greater than 6 seconds). Mismatch Volume: 146 mL (mismatch  ratio 4.6). Infarction Location:Right MCA Perfusion findings were reviewed in person with Dr. Ritta Slot at 1059 hours. CTA NECK Skeleton: No acute osseous abnormality identified. Age concordant degenerative changes in the cervical spine. Intermittent carious dentition. Upper chest: Negative; no superior mediastinal lymphadenopathy. There is calcified coronary artery atherosclerosis. Other neck: Negative thyroid, larynx, pharynx, parapharyngeal spaces, retropharyngeal space, sublingual space, submandibular glands and parotid glands. No cervical lymphadenopathy. Aortic arch: Moderate soft and atherosclerotic arch atherosclerosis. Three vessel arch configuration. Right carotid system: No brachiocephalic artery or right CCA origin stenosis. The right carotid bifurcation is patent, but the right ICA origin is occluded abruptly 9 mm beyond its origin (series 10,  image 39). The right ICA remains occluded to the skull base. Left carotid system: No left CCA origin stenosis. Medial soft plaque throughout the left CCA at the level of the thyroid, not hemodynamically significant. Soft and calcified plaque at the left ICA origin and bulb, not hemodynamically significant. Mid and distal cervical left ICA air negative. Vertebral arteries:No proximal right subclavian artery stenosis. Normal right vertebral artery origin. Negative cervical right vertebral artery. No proximal left subclavian artery stenosis  despite soft plaque. Soft plaque at the left vertebral artery origin and V1 segment with only mild stenosis. Otherwise negative cervical left vertebral artery. CTA HEAD Posterior circulation: Codominant distal vertebral arteries without stenosis. Patent PICA origins. Patent vertebrobasilar junction. No basilar stenosis. Normal SCA and PCA origins. Posterior communicating arteries are diminutive or absent. Mild bilateral P1 and P2 segment irregularity, no PCA stenosis identified. Anterior circulation: Left ICA siphon is patent with mild calcified plaque and no stenosis. Left ICA terminus, left MCA and left ACA origins are normal. Right ICA siphon is occluded through the cavernous segment. Beginning at the anterior genu there is reconstitution of the right ICA siphon, and the right ICA terminus is patent. The right MCA origin is patent but the mid M1 segment is occluded about 11 mm from the origin. There is minimal reconstituted enhancement in the proximal M2 segments. The right ACA origin is patent but diminutive and appears somewhat stenotic (series 8, image 89). The left A1 segment may be dominant. A patent anterior communicating artery is identified. The bilateral ACA A2 segments and distal ACA branches appear symmetric and within normal limits. Venous sinuses: Patent. Anatomic variants: Possibly dominant left ACA A1 segment. Review of the MIP images confirms the above findings IMPRESSION: 1. Positive for emergent large vessel occlusion, and favorable CT perfusion findings for endovascular re-perfusion. 2. Occlusion of the Right ICA just beyond its origin. Reconstitution of the Right ICA terminus beginning at the anterior genu (perhaps via the right ophthalmic artery). But superimposed Right MCA mid M1 segment occlusion. 3. CT perfusion indicates a core infarct of 40 mL in the Right MCA territory and right hemisphere penumbra of 146 mL. 4. The above was relayed to Dr. Ritta Slot beginning at 1059 hours.  5. Stenosis of the right ACA origin, but the right A1 segment may be non dominant. Right A2 and distal ACA enhancement is within normal limits. Normal left MCA and left ACA. 6. Aortic arch and bilateral carotid atherosclerosis without additional stenosis. 7. Mild left vertebral artery origin stenosis due to soft plaque. Otherwise negative posterior circulation. 8. Calcified coronary artery atherosclerosis. Electronically Signed   By: Odessa Fleming M.D.   On: 03/31/2017 11:22   Ct Cerebral Perfusion W Contrast  Result Date: 03/31/2017 CLINICAL DATA:  76 year old male with left side weakness and evidence of right MCA emergent large vessel occlusion on noncontrast head CT. EXAM: CT ANGIOGRAPHY HEAD AND NECK CT PERFUSION BRAIN TECHNIQUE: Multidetector CT imaging of the head and neck was performed using the standard protocol during bolus administration of intravenous contrast. Multiplanar CT image reconstructions and MIPs were obtained to evaluate the vascular anatomy. Carotid stenosis measurements (when applicable) are obtained utilizing NASCET criteria, using the distal internal carotid diameter as the denominator. Multiphase CT imaging of the brain was performed following IV bolus contrast injection. Subsequent parametric perfusion maps were calculated using RAPID software. CONTRAST:  90 mL Isovue 370 COMPARISON:  Noncontrast head CT 1048 hours today. FINDINGS: CT Brain Perfusion Findings: CBF (<30%) Volume: 40 mL (using  CBF less than 30%). Perfusion (Tmax>6.0s) volume: 185 mL (T-max greater than 6 seconds). Mismatch Volume: 146 mL (mismatch  ratio 4.6). Infarction Location:Right MCA Perfusion findings were reviewed in person with Dr. Ritta Slot at 1059 hours. CTA NECK Skeleton: No acute osseous abnormality identified. Age concordant degenerative changes in the cervical spine. Intermittent carious dentition. Upper chest: Negative; no superior mediastinal lymphadenopathy. There is calcified coronary artery  atherosclerosis. Other neck: Negative thyroid, larynx, pharynx, parapharyngeal spaces, retropharyngeal space, sublingual space, submandibular glands and parotid glands. No cervical lymphadenopathy. Aortic arch: Moderate soft and atherosclerotic arch atherosclerosis. Three vessel arch configuration. Right carotid system: No brachiocephalic artery or right CCA origin stenosis. The right carotid bifurcation is patent, but the right ICA origin is occluded abruptly 9 mm beyond its origin (series 10, image 39). The right ICA remains occluded to the skull base. Left carotid system: No left CCA origin stenosis. Medial soft plaque throughout the left CCA at the level of the thyroid, not hemodynamically significant. Soft and calcified plaque at the left ICA origin and bulb, not hemodynamically significant. Mid and distal cervical left ICA air negative. Vertebral arteries:No proximal right subclavian artery stenosis. Normal right vertebral artery origin. Negative cervical right vertebral artery. No proximal left subclavian artery stenosis despite soft plaque. Soft plaque at the left vertebral artery origin and V1 segment with only mild stenosis. Otherwise negative cervical left vertebral artery. CTA HEAD Posterior circulation: Codominant distal vertebral arteries without stenosis. Patent PICA origins. Patent vertebrobasilar junction. No basilar stenosis. Normal SCA and PCA origins. Posterior communicating arteries are diminutive or absent. Mild bilateral P1 and P2 segment irregularity, no PCA stenosis identified. Anterior circulation: Left ICA siphon is patent with mild calcified plaque and no stenosis. Left ICA terminus, left MCA and left ACA origins are normal. Right ICA siphon is occluded through the cavernous segment. Beginning at the anterior genu there is reconstitution of the right ICA siphon, and the right ICA terminus is patent. The right MCA origin is patent but the mid M1 segment is occluded about 11 mm from the  origin. There is minimal reconstituted enhancement in the proximal M2 segments. The right ACA origin is patent but diminutive and appears somewhat stenotic (series 8, image 89). The left A1 segment may be dominant. A patent anterior communicating artery is identified. The bilateral ACA A2 segments and distal ACA branches appear symmetric and within normal limits. Venous sinuses: Patent. Anatomic variants: Possibly dominant left ACA A1 segment. Review of the MIP images confirms the above findings IMPRESSION: 1. Positive for emergent large vessel occlusion, and favorable CT perfusion findings for endovascular re-perfusion. 2. Occlusion of the Right ICA just beyond its origin. Reconstitution of the Right ICA terminus beginning at the anterior genu (perhaps via the right ophthalmic artery). But superimposed Right MCA mid M1 segment occlusion. 3. CT perfusion indicates a core infarct of 40 mL in the Right MCA territory and right hemisphere penumbra of 146 mL. 4. The above was relayed to Dr. Ritta Slot beginning at 1059 hours. 5. Stenosis of the right ACA origin, but the right A1 segment may be non dominant. Right A2 and distal ACA enhancement is within normal limits. Normal left MCA and left ACA. 6. Aortic arch and bilateral carotid atherosclerosis without additional stenosis. 7. Mild left vertebral artery origin stenosis due to soft plaque. Otherwise negative posterior circulation. 8. Calcified coronary artery atherosclerosis. Electronically Signed   By: Odessa Fleming M.D.   On: 03/31/2017 11:22   Ct Head Code Stroke W/o Cm  Result Date: 03/31/2017 CLINICAL DATA:  Code stroke. 76 year old male with left side weakness. EXAM: CT HEAD WITHOUT CONTRAST TECHNIQUE: Contiguous axial images were obtained from the base of the skull through the vertex without intravenous contrast. COMPARISON:  Head CT without contrast 12/13/2009 FINDINGS: Brain: Hypodensity at the level of the right insula and external capsule (series 3,  image 17). No acute intracranial hemorrhage identified. No midline shift, mass effect, or evidence of intracranial mass lesion. Elsewhere gray-white matter differentiation appears stable since 2011. No ventriculomegaly. Vascular: Hyperdense distal right MCA M1 segment. Skull: No acute osseous abnormality identified. Sinuses/Orbits: Chronic paranasal sinus mucosal thickening, mildly progressed. Tympanic cavities and mastoids are clear. Other: No acute orbit or scalp soft tissue findings. ASPECTS [facility] Stroke Program Early CT Score) - Ganglionic level infarction (caudate, lentiform nuclei, internal capsule, insula, M1-M3 cortex): 6 (minus 1 for right insula) - Supraganglionic infarction (M4-M6 cortex): 3 Total score (0-10 with 10 being normal): 9 IMPRESSION: 1. Emergent right MCA large vessel occlusion suspected. No associated hemorrhage or mass effect. 2. ASPECTS is 9. 3. The above was relayed via text pager to Dr. Ritta Slot 1052 hours. Electronically Signed   By: Odessa Fleming M.D.   On: 03/31/2017 11:01   STUDIES:  4/27 CTA head/neck with right ICA occlusion, right MCA infarct, right ACA stenosis.   SIGNIFICANT EVENTS: 4/27 S/p revascularization  LINES/TUBES: 4/27 A-line  DISCUSSION: Mr. Himebaugh is a 76 year old man hospitalized for right MCA CVA s/p revascularization on ventilatory support.   ASSESSMENT / PLAN:  Acute respiratory failure: In the setting CVA. -Continue PRVC with 8cc/kg -Check CXR to assess for ETT position -Use prn fentanyl and versed for sedation  Right MCA CVA: Per Neurology.  Heywood Iles, PGY3 Internal Medicine Pager: [phone]  03/31/2017, 3:31 PM  Attending Note:  76 year old male with significant psychiatric PMH who presents to the hospital from a psych facility after a fall and unable to get up for 6 hours.  The history is incomplete since patient presented to MICU sedated and intubated.  On exam, he is rhythmically moving his right hand not left  and lungs are clear to auscultation.  I reviewed head CT myself, right MCA abnormalities noted.  Discussed with PCCM-resident.  Will hold in the ICU sedated and intubated.  Target SBP of 120-140 with cardene and neo available for BP control.  PS trials to start in AM, hold for today.  If unable to extubate in AM then will need to consider TF.  ASA and plavix given.  No need for anti-coagulation for now.  CXR ordered for ETT placement and AM labs ordered.  The patient is critically ill with multiple organ systems failure and requires high complexity decision making for assessment and support, frequent evaluation and titration of therapies, application of advanced monitoring technologies and extensive interpretation of multiple databases.   Critical Care Time devoted to patient care services described in this note is  35  Minutes. This time reflects time of care of this signee Dr Koren Bound. This critical care time does not reflect procedure time, or teaching time or supervisory time of PA/NP/Med student/Med Resident etc but could involve care discussion time.  Alyson Reedy, M.D. [facility] Pulmonary/Critical Care Medicine. Pager: [phone]. After hours pager: [phone].

## 2017-03-31 NOTE — Procedures (Signed)
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    Baseline mRS:  0 NIHSS:   >11 Last Known Well:  <24 hours    ASPECTS:   9 Anesthesia    GETA   Skin Puncture:   11:35am First Pass Date & Outcome:     1:02pm, TICI 2a Second Pass Date & Outcome:     1:20pm, TICI 3  Angioplasty to open the occluded ICA at the bifurcation:     12:00 and 12:14pm Stent of the ICA to restore flow to the intracranial ICA:     12:28pm Final stent of the ICA:     2:00pm  IA tPA:    No IA Medication:  No Proximal or Distal:  Proximal & Distal Post TICI Score:  TICI 3 Device:   Solitaire 4x40 with local     aspiration  Procedure:   US guided access of the right CFA.   Cerebral angiogram  Revascularization of right ICA occlusion with balloon angioplasty and emergent stenting.  Mechanical thrombectomy of the right MCA M1 occlusion with 2 pass with Solitaire and local aspiration.  Completion Angiogram of the right and left.    Findings:  Right MCA TICI 3 (complete) restored.   The occlusion of the right ICA had appearance of short segment dissection, with need for extended stent system of total length approximately 7cm from CCA/bifurcation to the ICA.   Complications: None.   Recommendations: To CT for head CT Stable to ICU Right CFA access was closed at end of case with exoseal. 6 hours right hip straight.  SBP in the 120-140 range for initial 24 hours.  Then per neurology Continue daily asa 81 and plavix 75 oral.  IV low dose heparin overnight, per pharamcy, thank you.  Signed,  Matthew Neu. Loreta Ave, DO

## 2017-03-31 NOTE — Sedation Documentation (Signed)
Right groin dressing and pulses checked at bedside with nurse. Dressing dry and intact with palpable pulses.

## 2017-03-31 NOTE — Sedation Documentation (Signed)
Brought pt to IR#2 from CT. Pt awake and alert. Oriented X3. Pupils equal and reactive. Pt able to answer questions appropriately. Speech slurred with facial droop. Noted right sided gaze. Able to move right sided extremities, no movement on left side.

## 2017-03-31 NOTE — Code Documentation (Signed)
76 y.o. Male who states he has no pertinent medical history who was stated to be in his normal state of health last night. This morning around 0400 the patient states he fell from a chair and remained in the floor until he was able to make it to his phone and call EMS. EMS arrived and appreciated left sided weakness, left sided facial droop and slurred speech. Code stroke activated by EMS. Upon arrival to [facility], airway was cleared, labs drawn and pt taken to CT. CT showing right hyperdense MCA sign. ASPECTS 9. CTA showing occlusion of the right ICA just beyond its origin. Reconstitution of the Right ICA terminus beginning at the anterior genu. But superimposed Right MCA mid M1 segment occlusion. CTP showing CBF volume of 40ml, perfusion volume of and mismatch volume of ( mismatch ratio 4.6). NIHSS 11. VAN (+). See EMR for NIHSS and code stroke times. On assessment, pt with left sided hemiparesis, pt able to move LUE and LLE slightly off the bed, right gaze preference but able to cross midline, minor left facial droop, sensory loss on left with neglect, mild dysarthria and mild aphasia. Pt also with tremor in his RUE that he states is not baseline.  LKW not able to be determined but was within the 24 hr window per the office manager at his residence who saw his yesterday evening. tPA not given d/t being out of the window. Case discussed with IR MD. Pt is thrombectomy candidate. Pt taken to IR. Bedside handoff with IR RN Elease Hashimoto.

## 2017-03-31 NOTE — Transfer of Care (Signed)
Immediate Anesthesia Transfer of Care Note  Patient: Matthew Meza  Procedure(s) Performed: Procedure(s): RADIOLOGY WITH ANESTHESIA (N/A)  Patient Location: ICU  Anesthesia Type:General  Level of Consciousness: sedated and Patient remains intubated per anesthesia plan  Airway & Oxygen Therapy: Patient remains intubated per anesthesia plan and Patient placed on Ventilator (see vital sign flow sheet for setting)  Post-op Assessment: Report given to RN and Post -op Vital signs reviewed and stable  Post vital signs: Reviewed and stable  Last Vitals:  Vitals:   03/31/17 1524  BP: 128/78    Last Pain: There were no vitals filed for this visit.       Complications: No apparent anesthesia complications   Transported from IR to CT for head scan.  On full monitors.  RR therapy in CT with vent.  Placed on vent for CT and then ambu bag 100%02 for transport to ICU.  VS remained stable throughout transport. BP ranged from 120/70-135/65.  HR SR 70's.  RR manually maintained at 10-12 bpm.  Saturation remained 100%.  Phenylephrine titrated off during transport.  Report to Mardella Layman Banker) at [employer].  Placed on monitor and vent at bedside in ICU.

## 2017-03-31 NOTE — Progress Notes (Signed)
Neuro-Interventional Radiology Preprocedure Note  76 yo male with acute right MCA syndrome.    Last known well last evening, presents within 24 hours to Ed.   CT shows ASPECTS 9. CTA shows tandem occlusion of right ICA & MCA/M1.  CTP shows ratio of core/penumbra ~3  Baseline mRS = 0.  Given his presentation and his imaging, thrombectomy is indicated.   Patient has no available family, and does indicate that he would like to proceed.    Emergent consent has been obtained.   Plan to proceed with angio and thrombectomy, with probably treatment of the ICA with plasty/stenting.   Signed,  Yvone Neu. Loreta Ave, DO

## 2017-03-31 NOTE — Anesthesia Preprocedure Evaluation (Addendum)
Anesthesia Evaluation  Patient identified by MRN, date of birth, ID band Patient awake    Reviewed: Allergy & Precautions, H&P , NPO status , Patient's Chart, lab work & pertinent test results  Airway Mallampati: II  TM Distance: <3 FB Neck ROM: Full    Dental no notable dental hx.    Pulmonary neg pulmonary ROS, former smoker,    Pulmonary exam normal breath sounds clear to auscultation       Cardiovascular negative cardio ROS Normal cardiovascular exam Rhythm:Regular Rate:Normal     Neuro/Psych negative neurological ROS  negative psych ROS   GI/Hepatic negative GI ROS, Neg liver ROS,   Endo/Other  negative endocrine ROS  Renal/GU negative Renal ROS  negative genitourinary   Musculoskeletal negative musculoskeletal ROS (+)   Abdominal   Peds negative pediatric ROS (+)  Hematology negative hematology ROS (+)   Anesthesia Other Findings   Reproductive/Obstetrics negative OB ROS                             Anesthesia Physical  Anesthesia Plan  ASA: III and emergent  Anesthesia Plan: General   Post-op Pain Management:    Induction: Intravenous and Rapid sequence  Airway Management Planned: Oral ETT  Additional Equipment: Arterial line  Intra-op Plan:   Post-operative Plan: Extubation in OR  Informed Consent: I have reviewed the patients History and Physical, chart, labs and discussed the procedure including the risks, benefits and alternatives for the proposed anesthesia with the patient or authorized representative who has indicated his/her understanding and acceptance.   Dental advisory given  Plan Discussed with: CRNA and Surgeon  Anesthesia Plan Comments:        Anesthesia Quick Evaluation

## 2017-04-01 ENCOUNTER — Encounter (HOSPITAL_COMMUNITY): Payer: Self-pay | Admitting: Interventional Radiology

## 2017-04-01 ENCOUNTER — Inpatient Hospital Stay (HOSPITAL_COMMUNITY): Payer: Medicare HMO

## 2017-04-01 DIAGNOSIS — E878 Other disorders of electrolyte and fluid balance, not elsewhere classified: Secondary | ICD-10-CM | POA: Diagnosis present

## 2017-04-01 DIAGNOSIS — I63031 Cerebral infarction due to thrombosis of right carotid artery: Secondary | ICD-10-CM

## 2017-04-01 LAB — CBC
HEMATOCRIT: 39.9 % (ref 39.0–52.0)
Hemoglobin: 14 g/dL (ref 13.0–17.0)
MCH: 31.8 pg (ref 26.0–34.0)
MCHC: 35.1 g/dL (ref 30.0–36.0)
MCV: 90.7 fL (ref 78.0–100.0)
Platelets: 154 10*3/uL (ref 150–400)
RBC: 4.4 MIL/uL (ref 4.22–5.81)
RDW: 13.1 % (ref 11.5–15.5)
WBC: 12.1 10*3/uL — ABNORMAL HIGH (ref 4.0–10.5)

## 2017-04-01 LAB — LIPID PANEL
Cholesterol: 148 mg/dL (ref 0–200)
HDL: 27 mg/dL — ABNORMAL LOW (ref >40)
LDL CALC: 99 mg/dL (ref 0–99)
TRIGLYCERIDES: 109 mg/dL (ref <150)
Total CHOL/HDL Ratio: 5.5 RATIO
VLDL: 22 mg/dL (ref 0–40)

## 2017-04-01 LAB — PHOSPHORUS: Phosphorus: 2 mg/dL — ABNORMAL LOW (ref 2.5–4.6)

## 2017-04-01 LAB — BASIC METABOLIC PANEL
ANION GAP: 8 (ref 5–15)
BUN: 13 mg/dL (ref 6–20)
CHLORIDE: 110 mmol/L (ref 101–111)
CO2: 21 mmol/L — AB (ref 22–32)
Calcium: 7.7 mg/dL — ABNORMAL LOW (ref 8.9–10.3)
Creatinine, Ser: 1.18 mg/dL (ref 0.61–1.24)
GFR calc non Af Amer: 59 mL/min — ABNORMAL LOW (ref >60)
Glucose, Bld: 184 mg/dL — ABNORMAL HIGH (ref 65–99)
Potassium: 4.3 mmol/L (ref 3.5–5.1)
Sodium: 139 mmol/L (ref 135–145)

## 2017-04-01 LAB — MAGNESIUM: Magnesium: 1.4 mg/dL — ABNORMAL LOW (ref 1.7–2.4)

## 2017-04-01 MED ORDER — MAGNESIUM SULFATE 4 GM/100ML IV SOLN
4.0000 g | Freq: Once | INTRAVENOUS | Status: AC
  Administered 2017-04-01: 4 g via INTRAVENOUS
  Filled 2017-04-01: qty 100

## 2017-04-01 MED ORDER — CLOPIDOGREL BISULFATE 75 MG PO TABS
75.0000 mg | ORAL_TABLET | Freq: Every day | ORAL | Status: DC
  Administered 2017-04-01: 75 mg
  Filled 2017-04-01: qty 1

## 2017-04-01 MED ORDER — ASPIRIN 81 MG PO CHEW
81.0000 mg | CHEWABLE_TABLET | Freq: Every day | ORAL | Status: DC
  Administered 2017-04-01 – 2017-04-02 (×2): 81 mg
  Filled 2017-04-01 (×2): qty 1

## 2017-04-01 MED ORDER — SODIUM PHOSPHATES 45 MMOLE/15ML IV SOLN
10.0000 mmol | Freq: Once | INTRAVENOUS | Status: AC
  Administered 2017-04-01: 10 mmol via INTRAVENOUS
  Filled 2017-04-01: qty 3.33

## 2017-04-01 NOTE — Consult Note (Signed)
PULMONARY / CRITICAL CARE MEDICINE   Name: Matthew Meza MRN: [id number] DOB: 1941/04/29    ADMISSION DATE:  03/31/2017 CONSULTATION DATE:  03/31/17  REFERRING MD:  Dr. Amada Jupiter  CHIEF COMPLAINT:  acute respiratory failure  brief   Matthew Meza is a 76 year old man hospitalized for right MCA stroke. History was collected from chart review as the patient is intubated. He presented to ED with left sided weakness and garbled speech. He was last seen normal yesterday evening at his living facility. Around 4AM, he got up and went to the bathroom but fell and could not get up. Six hours later, he called 911 and was transported as code stroke. In the ED, CTA head/neck was notable for right ICA occlusion just beyond the origin and right ACA stenosis  He was transported to IR for revascularization. PCCM was consulted for vent management.  EVENTs 03/31/2017 - admit stroke vent IR   SUBJECTIVE/OVERNIGHT/INTERVAL HX 04/01/2017 - agitated on vent with Rt side movement. -> versed prn and now sedated. Doing SBT . Afebrile    VITAL SIGNS: BP (!) 117/59 (BP Location: Left Arm)   Pulse (!) 103   Temp 99.1 F (37.3 C) (Oral)   Resp 13   Ht 5' 6.93" (1.7 m)   Wt 86.7 kg (191 lb 2.2 oz)   SpO2 99%   BMI 30.00 kg/m   HEMODYNAMICS:    VENTILATOR SETTINGS: Vent Mode: PRVC FiO2 (%):  [40 %-60 %] 40 % Set Rate:  [12 bmp] 12 bmp Vt Set:  [650 mL] 650 mL PEEP:  [5 cmH20] 5 cmH20 Plateau Pressure:  [13 cmH20-18 cmH20] 17 cmH20  INTAKE / OUTPUT: I/O last 3 completed shifts: In: 4280 [I.V.:4230; IV Piggyback:50] Out: 1490 [Urine:1475; Blood:15]  PHYSICAL EXAMINATION: Physical Exam  Constitutional: He is well-developed, well-nourished, and in no distress. No distress.  Elderly man  HENT:  Head: Normocephalic and atraumatic.  Eyes: Conjunctivae are normal. Pupils are equal, round, and reactive to light. Right eye exhibits no discharge. Left eye exhibits no discharge.  Neck: No JVD  present.  Cardiovascular: Normal rate, regular rhythm and normal heart sounds.  Exam reveals no gallop.   No murmur heard. Pulmonary/Chest: Effort normal. No respiratory distress.  Sync wth vent  Abdominal: Soft. He exhibits no distension and no mass. There is no rebound and no guarding.  Lymphadenopathy:    He has no cervical adenopathy.  Neurological:  rass -3 after prn versed Rt side movement only per RN Some movement in distal LLE  Skin: Skin is warm and dry. He is not diaphoretic.        LABS  PULMONARY  Recent Labs Lab 03/31/17 1044  TCO2 25    CBC  Recent Labs Lab 03/31/17 1040 03/31/17 1044 04/01/17 0441  HGB 16.0 15.6 14.0  HCT 46.3 46.0 39.9  WBC 11.5*  --  12.1*  PLT 168  --  154    COAGULATION  Recent Labs Lab 03/31/17 1040  INR 1.07    CARDIAC  No results for input(s): TROPONINI in the last 168 hours. No results for input(s): PROBNP in the last 168 hours.   CHEMISTRY  Recent Labs Lab 03/31/17 1040 03/31/17 1044 04/01/17 0441  NA 139 140 139  K 4.0 4.0 4.3  CL 105 105 110  CO2 21*  --  21*  GLUCOSE 148* 151* 184*  BUN 10 11 13   CREATININE 1.25* 1.10 1.18  CALCIUM 9.1  --  7.7*  MG  --   --  1.4*  PHOS  --   --  2.0*   Estimated Creatinine Clearance: 56.8 mL/min (by C-G formula based on SCr of 1.18 mg/dL).   LIVER  Recent Labs Lab 03/31/17 1040  AST 31  ALT 29  ALKPHOS 61  BILITOT 0.9  PROT 6.8  ALBUMIN 4.0  INR 1.07     INFECTIOUS No results for input(s): LATICACIDVEN, PROCALCITON in the last 168 hours.   ENDOCRINE CBG (last 3)   Recent Labs  03/31/17 1040 03/31/17 1617  GLUCAP 140* 91         IMAGING x48h  - image(s) personally visualized  -   highlighted in bold Ct Angio Head W Or Wo Contrast  Result Date: 03/31/2017 CLINICAL DATA:  76 year old male with left side weakness and evidence of right MCA emergent large vessel occlusion on noncontrast head CT. EXAM: CT ANGIOGRAPHY HEAD AND NECK CT  PERFUSION BRAIN TECHNIQUE: Multidetector CT imaging of the head and neck was performed using the standard protocol during bolus administration of intravenous contrast. Multiplanar CT image reconstructions and MIPs were obtained to evaluate the vascular anatomy. Carotid stenosis measurements (when applicable) are obtained utilizing NASCET criteria, using the distal internal carotid diameter as the denominator. Multiphase CT imaging of the brain was performed following IV bolus contrast injection. Subsequent parametric perfusion maps were calculated using RAPID software. CONTRAST:  90 mL Isovue 370 COMPARISON:  Noncontrast head CT 1048 hours today. FINDINGS: CT Brain Perfusion Findings: CBF (<30%) Volume: 40 mL (using CBF less than 30%). Perfusion (Tmax>6.0s) volume: 185 mL (T-max greater than 6 seconds). Mismatch Volume: 146 mL (mismatch  ratio 4.6). Infarction Location:Right MCA Perfusion findings were reviewed in person with Dr. Ritta Slot at 1059 hours. CTA NECK Skeleton: No acute osseous abnormality identified. Age concordant degenerative changes in the cervical spine. Intermittent carious dentition. Upper chest: Negative; no superior mediastinal lymphadenopathy. There is calcified coronary artery atherosclerosis. Other neck: Negative thyroid, larynx, pharynx, parapharyngeal spaces, retropharyngeal space, sublingual space, submandibular glands and parotid glands. No cervical lymphadenopathy. Aortic arch: Moderate soft and atherosclerotic arch atherosclerosis. Three vessel arch configuration. Right carotid system: No brachiocephalic artery or right CCA origin stenosis. The right carotid bifurcation is patent, but the right ICA origin is occluded abruptly 9 mm beyond its origin (series 10, image 39). The right ICA remains occluded to the skull base. Left carotid system: No left CCA origin stenosis. Medial soft plaque throughout the left CCA at the level of the thyroid, not hemodynamically significant. Soft  and calcified plaque at the left ICA origin and bulb, not hemodynamically significant. Mid and distal cervical left ICA air negative. Vertebral arteries:No proximal right subclavian artery stenosis. Normal right vertebral artery origin. Negative cervical right vertebral artery. No proximal left subclavian artery stenosis despite soft plaque. Soft plaque at the left vertebral artery origin and V1 segment with only mild stenosis. Otherwise negative cervical left vertebral artery. CTA HEAD Posterior circulation: Codominant distal vertebral arteries without stenosis. Patent PICA origins. Patent vertebrobasilar junction. No basilar stenosis. Normal SCA and PCA origins. Posterior communicating arteries are diminutive or absent. Mild bilateral P1 and P2 segment irregularity, no PCA stenosis identified. Anterior circulation: Left ICA siphon is patent with mild calcified plaque and no stenosis. Left ICA terminus, left MCA and left ACA origins are normal. Right ICA siphon is occluded through the cavernous segment. Beginning at the anterior genu there is reconstitution of the right ICA siphon, and the right ICA terminus is patent. The right MCA origin is patent but the mid  M1 segment is occluded about 11 mm from the origin. There is minimal reconstituted enhancement in the proximal M2 segments. The right ACA origin is patent but diminutive and appears somewhat stenotic (series 8, image 89). The left A1 segment may be dominant. A patent anterior communicating artery is identified. The bilateral ACA A2 segments and distal ACA branches appear symmetric and within normal limits. Venous sinuses: Patent. Anatomic variants: Possibly dominant left ACA A1 segment. Review of the MIP images confirms the above findings IMPRESSION: 1. Positive for emergent large vessel occlusion, and favorable CT perfusion findings for endovascular re-perfusion. 2. Occlusion of the Right ICA just beyond its origin. Reconstitution of the Right ICA terminus  beginning at the anterior genu (perhaps via the right ophthalmic artery). But superimposed Right MCA mid M1 segment occlusion. 3. CT perfusion indicates a core infarct of 40 mL in the Right MCA territory and right hemisphere penumbra of 146 mL. 4. The above was relayed to Dr. Ritta Slot beginning at 1059 hours. 5. Stenosis of the right ACA origin, but the right A1 segment may be non dominant. Right A2 and distal ACA enhancement is within normal limits. Normal left MCA and left ACA. 6. Aortic arch and bilateral carotid atherosclerosis without additional stenosis. 7. Mild left vertebral artery origin stenosis due to soft plaque. Otherwise negative posterior circulation. 8. Calcified coronary artery atherosclerosis. Electronically Signed   By: Odessa Fleming M.D.   On: 03/31/2017 11:22   Ct Head Wo Contrast  Result Date: 03/31/2017 CLINICAL DATA:  Status post revascularization of the occluded right internal carotid artery and right middle cerebral artery. Left-sided weakness. EXAM: CT HEAD WITHOUT CONTRAST TECHNIQUE: Contiguous axial images were obtained from the base of the skull through the vertex without intravenous contrast. COMPARISON:  CT head without contrast from the same day. FINDINGS: Brain: Hyperdensity is present within the right lentiform nucleus along the area of core infarct. There is also hyperdensity along the body of the body. Hyperdense cortex is present in the right frontal operculum. There is focal density along the posterior watershed territory and over the right frontal convexity. The left basal ganglia are intact. No acute or focal left-sided infarct is present. There is some swelling of the sulci on the right. A small amount of subarachnoid blood may present on the right. The ventricles are of normal size. There is no intraventricular hemorrhage. No extra-axial hemorrhage is present on the left. The brainstem and cerebellum are normal. Vascular: Atherosclerotic calcifications are present  in the cavernous internal carotid arteries. Contrast is present within the major intracranial arteries and dural sinuses. Skull: The calvarium is intact. Sinuses/Orbits: Extensive sinus disease is again noted. Fluid is present in the nasopharynx, likely associated with intubation. This has increased. IMPRESSION: 1. Extensive areas of hyperdensity involving the basal ganglia and right MCA territory cortex likely reflecting luxury perfusion associated with the areas of acute infarction following revascularization. Hemorrhage is not entirely excluded, but felt to be a minority of the hyperdensity visualized. The areas largely correspond to the areas of core infarction on the CTP study. 2. A tiny amount of subarachnoid hemorrhage may be present without intraventricular hemorrhage. Electronically Signed   By: Marin Roberts M.D.   On: 03/31/2017 16:02   Ct Angio Neck W Or Wo Contrast  Result Date: 03/31/2017 CLINICAL DATA:  76 year old male with left side weakness and evidence of right MCA emergent large vessel occlusion on noncontrast head CT. EXAM: CT ANGIOGRAPHY HEAD AND NECK CT PERFUSION BRAIN TECHNIQUE: Multidetector CT  imaging of the head and neck was performed using the standard protocol during bolus administration of intravenous contrast. Multiplanar CT image reconstructions and MIPs were obtained to evaluate the vascular anatomy. Carotid stenosis measurements (when applicable) are obtained utilizing NASCET criteria, using the distal internal carotid diameter as the denominator. Multiphase CT imaging of the brain was performed following IV bolus contrast injection. Subsequent parametric perfusion maps were calculated using RAPID software. CONTRAST:  90 mL Isovue 370 COMPARISON:  Noncontrast head CT 1048 hours today. FINDINGS: CT Brain Perfusion Findings: CBF (<30%) Volume: 40 mL (using CBF less than 30%). Perfusion (Tmax>6.0s) volume: 185 mL (T-max greater than 6 seconds). Mismatch Volume: 146 mL  (mismatch  ratio 4.6). Infarction Location:Right MCA Perfusion findings were reviewed in person with Dr. Ritta Slot at 1059 hours. CTA NECK Skeleton: No acute osseous abnormality identified. Age concordant degenerative changes in the cervical spine. Intermittent carious dentition. Upper chest: Negative; no superior mediastinal lymphadenopathy. There is calcified coronary artery atherosclerosis. Other neck: Negative thyroid, larynx, pharynx, parapharyngeal spaces, retropharyngeal space, sublingual space, submandibular glands and parotid glands. No cervical lymphadenopathy. Aortic arch: Moderate soft and atherosclerotic arch atherosclerosis. Three vessel arch configuration. Right carotid system: No brachiocephalic artery or right CCA origin stenosis. The right carotid bifurcation is patent, but the right ICA origin is occluded abruptly 9 mm beyond its origin (series 10, image 39). The right ICA remains occluded to the skull base. Left carotid system: No left CCA origin stenosis. Medial soft plaque throughout the left CCA at the level of the thyroid, not hemodynamically significant. Soft and calcified plaque at the left ICA origin and bulb, not hemodynamically significant. Mid and distal cervical left ICA air negative. Vertebral arteries:No proximal right subclavian artery stenosis. Normal right vertebral artery origin. Negative cervical right vertebral artery. No proximal left subclavian artery stenosis despite soft plaque. Soft plaque at the left vertebral artery origin and V1 segment with only mild stenosis. Otherwise negative cervical left vertebral artery. CTA HEAD Posterior circulation: Codominant distal vertebral arteries without stenosis. Patent PICA origins. Patent vertebrobasilar junction. No basilar stenosis. Normal SCA and PCA origins. Posterior communicating arteries are diminutive or absent. Mild bilateral P1 and P2 segment irregularity, no PCA stenosis identified. Anterior circulation: Left ICA  siphon is patent with mild calcified plaque and no stenosis. Left ICA terminus, left MCA and left ACA origins are normal. Right ICA siphon is occluded through the cavernous segment. Beginning at the anterior genu there is reconstitution of the right ICA siphon, and the right ICA terminus is patent. The right MCA origin is patent but the mid M1 segment is occluded about 11 mm from the origin. There is minimal reconstituted enhancement in the proximal M2 segments. The right ACA origin is patent but diminutive and appears somewhat stenotic (series 8, image 89). The left A1 segment may be dominant. A patent anterior communicating artery is identified. The bilateral ACA A2 segments and distal ACA branches appear symmetric and within normal limits. Venous sinuses: Patent. Anatomic variants: Possibly dominant left ACA A1 segment. Review of the MIP images confirms the above findings IMPRESSION: 1. Positive for emergent large vessel occlusion, and favorable CT perfusion findings for endovascular re-perfusion. 2. Occlusion of the Right ICA just beyond its origin. Reconstitution of the Right ICA terminus beginning at the anterior genu (perhaps via the right ophthalmic artery). But superimposed Right MCA mid M1 segment occlusion. 3. CT perfusion indicates a core infarct of 40 mL in the Right MCA territory and right hemisphere penumbra of 146 mL.  4. The above was relayed to Dr. Ritta Slot beginning at 1059 hours. 5. Stenosis of the right ACA origin, but the right A1 segment may be non dominant. Right A2 and distal ACA enhancement is within normal limits. Normal left MCA and left ACA. 6. Aortic arch and bilateral carotid atherosclerosis without additional stenosis. 7. Mild left vertebral artery origin stenosis due to soft plaque. Otherwise negative posterior circulation. 8. Calcified coronary artery atherosclerosis. Electronically Signed   By: Odessa Fleming M.D.   On: 03/31/2017 11:22   Ct Cerebral Perfusion W  Contrast  Result Date: 03/31/2017 CLINICAL DATA:  76 year old male with left side weakness and evidence of right MCA emergent large vessel occlusion on noncontrast head CT. EXAM: CT ANGIOGRAPHY HEAD AND NECK CT PERFUSION BRAIN TECHNIQUE: Multidetector CT imaging of the head and neck was performed using the standard protocol during bolus administration of intravenous contrast. Multiplanar CT image reconstructions and MIPs were obtained to evaluate the vascular anatomy. Carotid stenosis measurements (when applicable) are obtained utilizing NASCET criteria, using the distal internal carotid diameter as the denominator. Multiphase CT imaging of the brain was performed following IV bolus contrast injection. Subsequent parametric perfusion maps were calculated using RAPID software. CONTRAST:  90 mL Isovue 370 COMPARISON:  Noncontrast head CT 1048 hours today. FINDINGS: CT Brain Perfusion Findings: CBF (<30%) Volume: 40 mL (using CBF less than 30%). Perfusion (Tmax>6.0s) volume: 185 mL (T-max greater than 6 seconds). Mismatch Volume: 146 mL (mismatch  ratio 4.6). Infarction Location:Right MCA Perfusion findings were reviewed in person with Dr. Ritta Slot at 1059 hours. CTA NECK Skeleton: No acute osseous abnormality identified. Age concordant degenerative changes in the cervical spine. Intermittent carious dentition. Upper chest: Negative; no superior mediastinal lymphadenopathy. There is calcified coronary artery atherosclerosis. Other neck: Negative thyroid, larynx, pharynx, parapharyngeal spaces, retropharyngeal space, sublingual space, submandibular glands and parotid glands. No cervical lymphadenopathy. Aortic arch: Moderate soft and atherosclerotic arch atherosclerosis. Three vessel arch configuration. Right carotid system: No brachiocephalic artery or right CCA origin stenosis. The right carotid bifurcation is patent, but the right ICA origin is occluded abruptly 9 mm beyond its origin (series 10, image  39). The right ICA remains occluded to the skull base. Left carotid system: No left CCA origin stenosis. Medial soft plaque throughout the left CCA at the level of the thyroid, not hemodynamically significant. Soft and calcified plaque at the left ICA origin and bulb, not hemodynamically significant. Mid and distal cervical left ICA air negative. Vertebral arteries:No proximal right subclavian artery stenosis. Normal right vertebral artery origin. Negative cervical right vertebral artery. No proximal left subclavian artery stenosis despite soft plaque. Soft plaque at the left vertebral artery origin and V1 segment with only mild stenosis. Otherwise negative cervical left vertebral artery. CTA HEAD Posterior circulation: Codominant distal vertebral arteries without stenosis. Patent PICA origins. Patent vertebrobasilar junction. No basilar stenosis. Normal SCA and PCA origins. Posterior communicating arteries are diminutive or absent. Mild bilateral P1 and P2 segment irregularity, no PCA stenosis identified. Anterior circulation: Left ICA siphon is patent with mild calcified plaque and no stenosis. Left ICA terminus, left MCA and left ACA origins are normal. Right ICA siphon is occluded through the cavernous segment. Beginning at the anterior genu there is reconstitution of the right ICA siphon, and the right ICA terminus is patent. The right MCA origin is patent but the mid M1 segment is occluded about 11 mm from the origin. There is minimal reconstituted enhancement in the proximal M2 segments. The right ACA origin  is patent but diminutive and appears somewhat stenotic (series 8, image 89). The left A1 segment may be dominant. A patent anterior communicating artery is identified. The bilateral ACA A2 segments and distal ACA branches appear symmetric and within normal limits. Venous sinuses: Patent. Anatomic variants: Possibly dominant left ACA A1 segment. Review of the MIP images confirms the above findings  IMPRESSION: 1. Positive for emergent large vessel occlusion, and favorable CT perfusion findings for endovascular re-perfusion. 2. Occlusion of the Right ICA just beyond its origin. Reconstitution of the Right ICA terminus beginning at the anterior genu (perhaps via the right ophthalmic artery). But superimposed Right MCA mid M1 segment occlusion. 3. CT perfusion indicates a core infarct of 40 mL in the Right MCA territory and right hemisphere penumbra of 146 mL. 4. The above was relayed to Dr. Ritta Slot beginning at 1059 hours. 5. Stenosis of the right ACA origin, but the right A1 segment may be non dominant. Right A2 and distal ACA enhancement is within normal limits. Normal left MCA and left ACA. 6. Aortic arch and bilateral carotid atherosclerosis without additional stenosis. 7. Mild left vertebral artery origin stenosis due to soft plaque. Otherwise negative posterior circulation. 8. Calcified coronary artery atherosclerosis. Electronically Signed   By: Odessa Fleming M.D.   On: 03/31/2017 11:22   Dg Chest Port 1 View  Result Date: 04/01/2017 CLINICAL DATA:  Continued surveillance.  Stroke. EXAM: PORTABLE CHEST 1 VIEW COMPARISON:  03/31/2017. FINDINGS: Normal cardiomediastinal silhouette. ET tube good position. Early bibasilar atelectasis. No consolidation or frank edema. No pneumothorax. Enteric tube. IMPRESSION: Early bibasilar atelectasis similar to priors.  ETT stable. Electronically Signed   By: Elsie Stain M.D.   On: 04/01/2017 08:11   Dg Chest Port 1 View  Result Date: 03/31/2017 CLINICAL DATA:  Acute respiratory failure EXAM: PORTABLE CHEST 1 VIEW COMPARISON:  06/02/2013 FINDINGS: Endotracheal tube tip just below the clavicular heads. An orogastric tube reaches the stomach, with side port near the GE junction. Low volume chest with mild atelectasis. No edema, effusion, or pneumothorax. EKG leads create artifact over the chest. IMPRESSION: 1. Unremarkable positioning of endotracheal and  orogastric tubes. 2. Low volumes with mild atelectasis. Electronically Signed   By: Marnee Spring M.D.   On: 03/31/2017 16:42   Ct Head Code Stroke W/o Cm  Result Date: 03/31/2017 CLINICAL DATA:  Code stroke. 76 year old male with left side weakness. EXAM: CT HEAD WITHOUT CONTRAST TECHNIQUE: Contiguous axial images were obtained from the base of the skull through the vertex without intravenous contrast. COMPARISON:  Head CT without contrast 12/13/2009 FINDINGS: Brain: Hypodensity at the level of the right insula and external capsule (series 3, image 17). No acute intracranial hemorrhage identified. No midline shift, mass effect, or evidence of intracranial mass lesion. Elsewhere gray-white matter differentiation appears stable since 2011. No ventriculomegaly. Vascular: Hyperdense distal right MCA M1 segment. Skull: No acute osseous abnormality identified. Sinuses/Orbits: Chronic paranasal sinus mucosal thickening, mildly progressed. Tympanic cavities and mastoids are clear. Other: No acute orbit or scalp soft tissue findings. ASPECTS [facility] Stroke Program Early CT Score) - Ganglionic level infarction (caudate, lentiform nuclei, internal capsule, insula, M1-M3 cortex): 6 (minus 1 for right insula) - Supraganglionic infarction (M4-M6 cortex): 3 Total score (0-10 with 10 being normal): 9 IMPRESSION: 1. Emergent right MCA large vessel occlusion suspected. No associated hemorrhage or mass effect. 2. ASPECTS is 9. 3. The above was relayed via text pager to Dr. Ritta Slot 1052 hours. Electronically Signed   By: Rexene Edison  Margo Aye M.D.   On: 03/31/2017 11:01       ASSESSMENT and PLAN  Acute respiratory failure (HCC) Due to stroke. This AM does not meet extubatino criterial due to sedation   Plan sbt as tolerated No extubation 04/01/2017   Acute encephalopathy agiated intermittnelty and needing prn sedation  Plan Continue prn sedation If needed can do diprivan gtt  Stroke (cerebrum) (HCC) rx per  neuro stroke service  Electrolyte imbalance Rx low mag and phos      FAMILY  - Updates: 04/01/2017 --> no family at bedside  - Inter-disciplinary family meet or Palliative Care meeting due by:  DAy 7. Current LOS is LOS 1 days  CODE STATUS    Code Status Orders        Start     Ordered   03/31/17 1553  Full code  Continuous     03/31/17 1554    Code Status History    Date Active Date Inactive Code Status Order ID Comments User Context   03/31/2017  3:32 PM 03/31/2017  3:54 PM Full Code [id number]  Rejeana Brock, MD Inpatient   06/21/2012  9:59 PM 06/23/2012  6:00 PM Full Code [id number]  Valaria Good, RN Inpatient        DISPO Keep in ICU      The patient is critically ill with multiple organ systems failure and requires high complexity decision making for assessment and support, frequent evaluation and titration of therapies, application of advanced monitoring technologies and extensive interpretation of multiple databases.   Critical Care Time devoted to patient care services described in this note is  30  Minutes. This time reflects time of care of this signee Dr Kalman Shan. This critical care time does not reflect procedure time, or teaching time or supervisory time of PA/NP/Med student/Med Resident etc but could involve care discussion time    Dr. Kalman Shan, M.D., [facility].C.P Pulmonary and Critical Care Medicine Staff Physician [facility] Pager: [phone], If no answer or between  15:00h - 7:00h: call [phone]  04/01/2017 8:54 AM

## 2017-04-01 NOTE — Assessment & Plan Note (Addendum)
Some evolutino yesterday via CT but MRI with stability  Plan Per neuro service

## 2017-04-01 NOTE — Assessment & Plan Note (Addendum)
Due to stroke. This AM  Doing SBT and likely wil meet extubation criteria  Plan sbt as tolerated If does well can extubate

## 2017-04-01 NOTE — Assessment & Plan Note (Addendum)
Low phos +  Plan repelte phos

## 2017-04-01 NOTE — Progress Notes (Signed)
SLP Cancellation Note  Patient Details Name: Matthew Meza MRN: [id number] DOB: [birth year]   Cancelled treatment:       Reason Eval/Treat Not Completed: Patient not medically ready. Pt intubated. SLP will f/u when appropriate.  Rondel Baton, [place] CF-SLP Speech-Language Pathologist [phone]   Matthew Meza 04/01/2017, 8:46 AM

## 2017-04-01 NOTE — Progress Notes (Signed)
Initial Nutrition Assessment  DOCUMENTATION CODES:   Obesity unspecified  INTERVENTION:  If patient expected to remain intubated >24-48 hours recommend initiating: -Vital High Protein at 40 ml/hr + Pro-Stat 30 ml TID via OGT. Provides 1260 kcal, 129 grams of protein (96% estimated needs), and 806 ml water.  Recommend liquid multivitamin with minerals per tube daily as goal TF regimen does not meet 100% RDIs.  NUTRITION DIAGNOSIS:   Inadequate oral intake related to inability to eat as evidenced by NPO status.  GOAL:   Provide needs based on ASPEN/SCCM guidelines  MONITOR:   Vent status, Labs, Weight trends, TF tolerance, I & O's  REASON FOR ASSESSMENT:   Ventilator    ASSESSMENT:   76 year old male presented with right MCA CVA s/p revascularization, required intubation.   HOB >30 on assessment.  Access: OGT terminating in stomach per chest x-ray 4/27  Patient is currently intubated on ventilator support MV: 10.2 L/min Temp (24hrs), Avg:98.1 F (36.7 C), Min:97.4 F (36.3 C), Max:99.1 F (37.3 C)  Medications reviewed and include: NS @ 75 ml/hr, famotidine, magnesium sulfate 4 grams IV, Cardene gtt.  Labs reviewed: CO2 21, Phosphorus 2, Magnesium 1.4.  Limited Nutrition-Focused physical exam completed as patient needed to be repositioned by RN. Findings are no fat depletion, no muscle depletion.  Discussed with RN. No plan for extubation today. Unsure if MD would like to start TF today.   Diet Order:  Diet NPO time specified  Skin:  Reviewed, no issues  Last BM:  PTA  Height:   Ht Readings from Last 1 Encounters:  03/31/17 5' 6.93" (1.7 m)    Weight:   Wt Readings from Last 1 Encounters:  04/01/17 191 lb 2.2 oz (86.7 kg)    Ideal Body Weight:  67.08 kg  BMI:  Body mass index is 30 kg/m.  Estimated Nutritional Needs:   Kcal:  811-9147  Protein:  >/= 134 grams  Fluid:  2.1 L/day  EDUCATION NEEDS:   No education needs identified at this  time  Helane Rima, MS, RD, LDN Pager: [phone] After Hours Pager: [phone]

## 2017-04-01 NOTE — Progress Notes (Signed)
Referring Physician(s):  Dr. Amada Jupiter  Supervising Physician: Richarda Overlie  Patient Status:  [facility] - In-pt  Chief Complaint:   Right MCA stroke  Subjective: Remains intubated, sedated. Per RN, follows commands although agitated.   Allergies: No known allergies  Medications: Prior to Admission medications   Medication Sig Start Date End Date Taking? Authorizing Provider  b complex vitamins tablet Take 1 tablet by mouth every other day.    Historical Provider, MD  Cholecalciferol (VITAMIN D) 400 UNITS capsule Take 400 Units by mouth daily.    Historical Provider, MD  Garlic 1000 MG CAPS Take 1 capsule by mouth every other day.    Historical Provider, MD  Lactobacillus Rhamnosus, GG, ([employer] PROBIOTIC, LACTOBACILLUS,) CAPS Use as directed on the bottle. Patient not taking: Reported on 10/10/2015 04/02/15   Ofilia Neas, PA-C  magnesium 30 MG tablet Take 30 mg by mouth every other day.    Historical Provider, MD  Multiple Vitamin (MULTIVITAMIN WITH MINERALS) TABS Take 1 tablet by mouth daily.    Historical Provider, MD  predniSONE (DELTASONE) 10 MG tablet 6-5-4-3-2-1 po pc for sinus Patient not taking: Reported on 10/10/2015 09/08/14   Ashley Jacobs Guest, MD  psyllium (METAMUCIL SMOOTH TEXTURE) 28 % packet Take 1 packet by mouth 2 (two) times daily. Patient not taking: Reported on 10/10/2015 04/02/15   Ofilia Neas, PA-C     Vital Signs: BP (!) 117/59 (BP Location: Left Arm)   Pulse (!) 103   Temp 99.1 F (37.3 C) (Oral)   Resp 13   Ht 5' 6.93" (1.7 m)   Wt 191 lb 2.2 oz (86.7 kg)   SpO2 99%   BMI 30.00 kg/m   Physical Exam  Constitutional: He appears well-developed.  Cardiovascular: Normal rate and regular rhythm.   Pulmonary/Chest:  intubated  Neurological:  sedated  Skin:  Groin site soft.  Dressing c/d/I.  No evidence of hematoma or pseudoaneurysm.   Nursing note and vitals reviewed.   Imaging: Ct Angio Head W Or Wo Contrast  Result Date: 03/31/2017 CLINICAL  DATA:  76 year old male with left side weakness and evidence of right MCA emergent large vessel occlusion on noncontrast head CT. EXAM: CT ANGIOGRAPHY HEAD AND NECK CT PERFUSION BRAIN TECHNIQUE: Multidetector CT imaging of the head and neck was performed using the standard protocol during bolus administration of intravenous contrast. Multiplanar CT image reconstructions and MIPs were obtained to evaluate the vascular anatomy. Carotid stenosis measurements (when applicable) are obtained utilizing NASCET criteria, using the distal internal carotid diameter as the denominator. Multiphase CT imaging of the brain was performed following IV bolus contrast injection. Subsequent parametric perfusion maps were calculated using RAPID software. CONTRAST:  90 mL Isovue 370 COMPARISON:  Noncontrast head CT 1048 hours today. FINDINGS: CT Brain Perfusion Findings: CBF (<30%) Volume: 40 mL (using CBF less than 30%). Perfusion (Tmax>6.0s) volume: 185 mL (T-max greater than 6 seconds). Mismatch Volume: 146 mL (mismatch  ratio 4.6). Infarction Location:Right MCA Perfusion findings were reviewed in person with Dr. Ritta Slot at 1059 hours. CTA NECK Skeleton: No acute osseous abnormality identified. Age concordant degenerative changes in the cervical spine. Intermittent carious dentition. Upper chest: Negative; no superior mediastinal lymphadenopathy. There is calcified coronary artery atherosclerosis. Other neck: Negative thyroid, larynx, pharynx, parapharyngeal spaces, retropharyngeal space, sublingual space, submandibular glands and parotid glands. No cervical lymphadenopathy. Aortic arch: Moderate soft and atherosclerotic arch atherosclerosis. Three vessel arch configuration. Right carotid system: No brachiocephalic artery or right CCA origin stenosis. The  right carotid bifurcation is patent, but the right ICA origin is occluded abruptly 9 mm beyond its origin (series 10, image 39). The right ICA remains occluded to the  skull base. Left carotid system: No left CCA origin stenosis. Medial soft plaque throughout the left CCA at the level of the thyroid, not hemodynamically significant. Soft and calcified plaque at the left ICA origin and bulb, not hemodynamically significant. Mid and distal cervical left ICA air negative. Vertebral arteries:No proximal right subclavian artery stenosis. Normal right vertebral artery origin. Negative cervical right vertebral artery. No proximal left subclavian artery stenosis despite soft plaque. Soft plaque at the left vertebral artery origin and V1 segment with only mild stenosis. Otherwise negative cervical left vertebral artery. CTA HEAD Posterior circulation: Codominant distal vertebral arteries without stenosis. Patent PICA origins. Patent vertebrobasilar junction. No basilar stenosis. Normal SCA and PCA origins. Posterior communicating arteries are diminutive or absent. Mild bilateral P1 and P2 segment irregularity, no PCA stenosis identified. Anterior circulation: Left ICA siphon is patent with mild calcified plaque and no stenosis. Left ICA terminus, left MCA and left ACA origins are normal. Right ICA siphon is occluded through the cavernous segment. Beginning at the anterior genu there is reconstitution of the right ICA siphon, and the right ICA terminus is patent. The right MCA origin is patent but the mid M1 segment is occluded about 11 mm from the origin. There is minimal reconstituted enhancement in the proximal M2 segments. The right ACA origin is patent but diminutive and appears somewhat stenotic (series 8, image 89). The left A1 segment may be dominant. A patent anterior communicating artery is identified. The bilateral ACA A2 segments and distal ACA branches appear symmetric and within normal limits. Venous sinuses: Patent. Anatomic variants: Possibly dominant left ACA A1 segment. Review of the MIP images confirms the above findings IMPRESSION: 1. Positive for emergent large vessel  occlusion, and favorable CT perfusion findings for endovascular re-perfusion. 2. Occlusion of the Right ICA just beyond its origin. Reconstitution of the Right ICA terminus beginning at the anterior genu (perhaps via the right ophthalmic artery). But superimposed Right MCA mid M1 segment occlusion. 3. CT perfusion indicates a core infarct of 40 mL in the Right MCA territory and right hemisphere penumbra of 146 mL. 4. The above was relayed to Dr. Ritta Slot beginning at 1059 hours. 5. Stenosis of the right ACA origin, but the right A1 segment may be non dominant. Right A2 and distal ACA enhancement is within normal limits. Normal left MCA and left ACA. 6. Aortic arch and bilateral carotid atherosclerosis without additional stenosis. 7. Mild left vertebral artery origin stenosis due to soft plaque. Otherwise negative posterior circulation. 8. Calcified coronary artery atherosclerosis. Electronically Signed   By: Odessa Fleming M.D.   On: 03/31/2017 11:22   Ct Head Wo Contrast  Result Date: 03/31/2017 CLINICAL DATA:  Status post revascularization of the occluded right internal carotid artery and right middle cerebral artery. Left-sided weakness. EXAM: CT HEAD WITHOUT CONTRAST TECHNIQUE: Contiguous axial images were obtained from the base of the skull through the vertex without intravenous contrast. COMPARISON:  CT head without contrast from the same day. FINDINGS: Brain: Hyperdensity is present within the right lentiform nucleus along the area of core infarct. There is also hyperdensity along the body of the body. Hyperdense cortex is present in the right frontal operculum. There is focal density along the posterior watershed territory and over the right frontal convexity. The left basal ganglia are intact. No acute or  focal left-sided infarct is present. There is some swelling of the sulci on the right. A small amount of subarachnoid blood may present on the right. The ventricles are of normal size. There is no  intraventricular hemorrhage. No extra-axial hemorrhage is present on the left. The brainstem and cerebellum are normal. Vascular: Atherosclerotic calcifications are present in the cavernous internal carotid arteries. Contrast is present within the major intracranial arteries and dural sinuses. Skull: The calvarium is intact. Sinuses/Orbits: Extensive sinus disease is again noted. Fluid is present in the nasopharynx, likely associated with intubation. This has increased. IMPRESSION: 1. Extensive areas of hyperdensity involving the basal ganglia and right MCA territory cortex likely reflecting luxury perfusion associated with the areas of acute infarction following revascularization. Hemorrhage is not entirely excluded, but felt to be a minority of the hyperdensity visualized. The areas largely correspond to the areas of core infarction on the CTP study. 2. A tiny amount of subarachnoid hemorrhage may be present without intraventricular hemorrhage. Electronically Signed   By: Marin Roberts M.D.   On: 03/31/2017 16:02   Ct Angio Neck W Or Wo Contrast  Result Date: 03/31/2017 CLINICAL DATA:  76 year old male with left side weakness and evidence of right MCA emergent large vessel occlusion on noncontrast head CT. EXAM: CT ANGIOGRAPHY HEAD AND NECK CT PERFUSION BRAIN TECHNIQUE: Multidetector CT imaging of the head and neck was performed using the standard protocol during bolus administration of intravenous contrast. Multiplanar CT image reconstructions and MIPs were obtained to evaluate the vascular anatomy. Carotid stenosis measurements (when applicable) are obtained utilizing NASCET criteria, using the distal internal carotid diameter as the denominator. Multiphase CT imaging of the brain was performed following IV bolus contrast injection. Subsequent parametric perfusion maps were calculated using RAPID software. CONTRAST:  90 mL Isovue 370 COMPARISON:  Noncontrast head CT 1048 hours today. FINDINGS: CT  Brain Perfusion Findings: CBF (<30%) Volume: 40 mL (using CBF less than 30%). Perfusion (Tmax>6.0s) volume: 185 mL (T-max greater than 6 seconds). Mismatch Volume: 146 mL (mismatch  ratio 4.6). Infarction Location:Right MCA Perfusion findings were reviewed in person with Dr. Ritta Slot at 1059 hours. CTA NECK Skeleton: No acute osseous abnormality identified. Age concordant degenerative changes in the cervical spine. Intermittent carious dentition. Upper chest: Negative; no superior mediastinal lymphadenopathy. There is calcified coronary artery atherosclerosis. Other neck: Negative thyroid, larynx, pharynx, parapharyngeal spaces, retropharyngeal space, sublingual space, submandibular glands and parotid glands. No cervical lymphadenopathy. Aortic arch: Moderate soft and atherosclerotic arch atherosclerosis. Three vessel arch configuration. Right carotid system: No brachiocephalic artery or right CCA origin stenosis. The right carotid bifurcation is patent, but the right ICA origin is occluded abruptly 9 mm beyond its origin (series 10, image 39). The right ICA remains occluded to the skull base. Left carotid system: No left CCA origin stenosis. Medial soft plaque throughout the left CCA at the level of the thyroid, not hemodynamically significant. Soft and calcified plaque at the left ICA origin and bulb, not hemodynamically significant. Mid and distal cervical left ICA air negative. Vertebral arteries:No proximal right subclavian artery stenosis. Normal right vertebral artery origin. Negative cervical right vertebral artery. No proximal left subclavian artery stenosis despite soft plaque. Soft plaque at the left vertebral artery origin and V1 segment with only mild stenosis. Otherwise negative cervical left vertebral artery. CTA HEAD Posterior circulation: Codominant distal vertebral arteries without stenosis. Patent PICA origins. Patent vertebrobasilar junction. No basilar stenosis. Normal SCA and PCA  origins. Posterior communicating arteries are diminutive or absent. Mild bilateral  P1 and P2 segment irregularity, no PCA stenosis identified. Anterior circulation: Left ICA siphon is patent with mild calcified plaque and no stenosis. Left ICA terminus, left MCA and left ACA origins are normal. Right ICA siphon is occluded through the cavernous segment. Beginning at the anterior genu there is reconstitution of the right ICA siphon, and the right ICA terminus is patent. The right MCA origin is patent but the mid M1 segment is occluded about 11 mm from the origin. There is minimal reconstituted enhancement in the proximal M2 segments. The right ACA origin is patent but diminutive and appears somewhat stenotic (series 8, image 89). The left A1 segment may be dominant. A patent anterior communicating artery is identified. The bilateral ACA A2 segments and distal ACA branches appear symmetric and within normal limits. Venous sinuses: Patent. Anatomic variants: Possibly dominant left ACA A1 segment. Review of the MIP images confirms the above findings IMPRESSION: 1. Positive for emergent large vessel occlusion, and favorable CT perfusion findings for endovascular re-perfusion. 2. Occlusion of the Right ICA just beyond its origin. Reconstitution of the Right ICA terminus beginning at the anterior genu (perhaps via the right ophthalmic artery). But superimposed Right MCA mid M1 segment occlusion. 3. CT perfusion indicates a core infarct of 40 mL in the Right MCA territory and right hemisphere penumbra of 146 mL. 4. The above was relayed to Dr. Ritta Slot beginning at 1059 hours. 5. Stenosis of the right ACA origin, but the right A1 segment may be non dominant. Right A2 and distal ACA enhancement is within normal limits. Normal left MCA and left ACA. 6. Aortic arch and bilateral carotid atherosclerosis without additional stenosis. 7. Mild left vertebral artery origin stenosis due to soft plaque. Otherwise negative  posterior circulation. 8. Calcified coronary artery atherosclerosis. Electronically Signed   By: Odessa Fleming M.D.   On: 03/31/2017 11:22   Ct Cerebral Perfusion W Contrast  Result Date: 03/31/2017 CLINICAL DATA:  76 year old male with left side weakness and evidence of right MCA emergent large vessel occlusion on noncontrast head CT. EXAM: CT ANGIOGRAPHY HEAD AND NECK CT PERFUSION BRAIN TECHNIQUE: Multidetector CT imaging of the head and neck was performed using the standard protocol during bolus administration of intravenous contrast. Multiplanar CT image reconstructions and MIPs were obtained to evaluate the vascular anatomy. Carotid stenosis measurements (when applicable) are obtained utilizing NASCET criteria, using the distal internal carotid diameter as the denominator. Multiphase CT imaging of the brain was performed following IV bolus contrast injection. Subsequent parametric perfusion maps were calculated using RAPID software. CONTRAST:  90 mL Isovue 370 COMPARISON:  Noncontrast head CT 1048 hours today. FINDINGS: CT Brain Perfusion Findings: CBF (<30%) Volume: 40 mL (using CBF less than 30%). Perfusion (Tmax>6.0s) volume: 185 mL (T-max greater than 6 seconds). Mismatch Volume: 146 mL (mismatch  ratio 4.6). Infarction Location:Right MCA Perfusion findings were reviewed in person with Dr. Ritta Slot at 1059 hours. CTA NECK Skeleton: No acute osseous abnormality identified. Age concordant degenerative changes in the cervical spine. Intermittent carious dentition. Upper chest: Negative; no superior mediastinal lymphadenopathy. There is calcified coronary artery atherosclerosis. Other neck: Negative thyroid, larynx, pharynx, parapharyngeal spaces, retropharyngeal space, sublingual space, submandibular glands and parotid glands. No cervical lymphadenopathy. Aortic arch: Moderate soft and atherosclerotic arch atherosclerosis. Three vessel arch configuration. Right carotid system: No brachiocephalic  artery or right CCA origin stenosis. The right carotid bifurcation is patent, but the right ICA origin is occluded abruptly 9 mm beyond its origin (series 10, image 39). The  right ICA remains occluded to the skull base. Left carotid system: No left CCA origin stenosis. Medial soft plaque throughout the left CCA at the level of the thyroid, not hemodynamically significant. Soft and calcified plaque at the left ICA origin and bulb, not hemodynamically significant. Mid and distal cervical left ICA air negative. Vertebral arteries:No proximal right subclavian artery stenosis. Normal right vertebral artery origin. Negative cervical right vertebral artery. No proximal left subclavian artery stenosis despite soft plaque. Soft plaque at the left vertebral artery origin and V1 segment with only mild stenosis. Otherwise negative cervical left vertebral artery. CTA HEAD Posterior circulation: Codominant distal vertebral arteries without stenosis. Patent PICA origins. Patent vertebrobasilar junction. No basilar stenosis. Normal SCA and PCA origins. Posterior communicating arteries are diminutive or absent. Mild bilateral P1 and P2 segment irregularity, no PCA stenosis identified. Anterior circulation: Left ICA siphon is patent with mild calcified plaque and no stenosis. Left ICA terminus, left MCA and left ACA origins are normal. Right ICA siphon is occluded through the cavernous segment. Beginning at the anterior genu there is reconstitution of the right ICA siphon, and the right ICA terminus is patent. The right MCA origin is patent but the mid M1 segment is occluded about 11 mm from the origin. There is minimal reconstituted enhancement in the proximal M2 segments. The right ACA origin is patent but diminutive and appears somewhat stenotic (series 8, image 89). The left A1 segment may be dominant. A patent anterior communicating artery is identified. The bilateral ACA A2 segments and distal ACA branches appear symmetric and  within normal limits. Venous sinuses: Patent. Anatomic variants: Possibly dominant left ACA A1 segment. Review of the MIP images confirms the above findings IMPRESSION: 1. Positive for emergent large vessel occlusion, and favorable CT perfusion findings for endovascular re-perfusion. 2. Occlusion of the Right ICA just beyond its origin. Reconstitution of the Right ICA terminus beginning at the anterior genu (perhaps via the right ophthalmic artery). But superimposed Right MCA mid M1 segment occlusion. 3. CT perfusion indicates a core infarct of 40 mL in the Right MCA territory and right hemisphere penumbra of 146 mL. 4. The above was relayed to Dr. Ritta Slot beginning at 1059 hours. 5. Stenosis of the right ACA origin, but the right A1 segment may be non dominant. Right A2 and distal ACA enhancement is within normal limits. Normal left MCA and left ACA. 6. Aortic arch and bilateral carotid atherosclerosis without additional stenosis. 7. Mild left vertebral artery origin stenosis due to soft plaque. Otherwise negative posterior circulation. 8. Calcified coronary artery atherosclerosis. Electronically Signed   By: Odessa Fleming M.D.   On: 03/31/2017 11:22   Dg Chest Port 1 View  Result Date: 04/01/2017 CLINICAL DATA:  Continued surveillance.  Stroke. EXAM: PORTABLE CHEST 1 VIEW COMPARISON:  03/31/2017. FINDINGS: Normal cardiomediastinal silhouette. ET tube good position. Early bibasilar atelectasis. No consolidation or frank edema. No pneumothorax. Enteric tube. IMPRESSION: Early bibasilar atelectasis similar to priors.  ETT stable. Electronically Signed   By: Elsie Stain M.D.   On: 04/01/2017 08:11   Dg Chest Port 1 View  Result Date: 03/31/2017 CLINICAL DATA:  Acute respiratory failure EXAM: PORTABLE CHEST 1 VIEW COMPARISON:  06/02/2013 FINDINGS: Endotracheal tube tip just below the clavicular heads. An orogastric tube reaches the stomach, with side port near the GE junction. Low volume chest with mild  atelectasis. No edema, effusion, or pneumothorax. EKG leads create artifact over the chest. IMPRESSION: 1. Unremarkable positioning of endotracheal and orogastric tubes. 2.  Low volumes with mild atelectasis. Electronically Signed   By: Marnee Spring M.D.   On: 03/31/2017 16:42   Ct Head Code Stroke W/o Cm  Result Date: 03/31/2017 CLINICAL DATA:  Code stroke. 76 year old male with left side weakness. EXAM: CT HEAD WITHOUT CONTRAST TECHNIQUE: Contiguous axial images were obtained from the base of the skull through the vertex without intravenous contrast. COMPARISON:  Head CT without contrast 12/13/2009 FINDINGS: Brain: Hypodensity at the level of the right insula and external capsule (series 3, image 17). No acute intracranial hemorrhage identified. No midline shift, mass effect, or evidence of intracranial mass lesion. Elsewhere gray-white matter differentiation appears stable since 2011. No ventriculomegaly. Vascular: Hyperdense distal right MCA M1 segment. Skull: No acute osseous abnormality identified. Sinuses/Orbits: Chronic paranasal sinus mucosal thickening, mildly progressed. Tympanic cavities and mastoids are clear. Other: No acute orbit or scalp soft tissue findings. ASPECTS [facility] Stroke Program Early CT Score) - Ganglionic level infarction (caudate, lentiform nuclei, internal capsule, insula, M1-M3 cortex): 6 (minus 1 for right insula) - Supraganglionic infarction (M4-M6 cortex): 3 Total score (0-10 with 10 being normal): 9 IMPRESSION: 1. Emergent right MCA large vessel occlusion suspected. No associated hemorrhage or mass effect. 2. ASPECTS is 9. 3. The above was relayed via text pager to Dr. Ritta Slot 1052 hours. Electronically Signed   By: Odessa Fleming M.D.   On: 03/31/2017 11:01    Labs:  CBC:  Recent Labs  08/10/16 1324 03/31/17 1040 03/31/17 1044 04/01/17 0441  WBC 8.3 11.5*  --  12.1*  HGB 16.6 16.0 15.6 14.0  HCT 47.7 46.3 46.0 39.9  PLT 202 168  --  154     COAGS:  Recent Labs  03/31/17 1040  INR 1.07  APTT 26    BMP:  Recent Labs  08/10/16 1324 03/31/17 1040 03/31/17 1044 04/01/17 0441  NA 141 139 140 139  K 4.8 4.0 4.0 4.3  CL 102 105 105 110  CO2 28 21*  --  21*  GLUCOSE 112* 148* 151* 184*  BUN CALCIUM 9.9 9.1  --  7.7*  CREATININE 1.16 1.25* 1.10 1.18  GFRNONAA  --  55*  --  59*  GFRAA  --  >60  --  >60    LIVER FUNCTION TESTS:  Recent Labs  08/10/16 1324 03/31/17 1040  BILITOT 0.9 0.9  AST 27 31  ALT 42 29  ALKPHOS 71 61  PROT 7.8 6.8  ALBUMIN 4.6 4.0    Assessment and Plan: Right MCA stroke s/p revascularization of right ICA occlusion with balloon angioplasty/stent. Patient remains intubated and sedated.  Groin site intact and soft without evidence of hematoma or pseudoaneurysm.   Electronically Signed: Hoyt Koch 04/01/2017, 9:51 AM   I spent a total of 15 Minutes at the the patient's bedside AND on the patient's hospital floor or unit, greater than 50% of which was counseling/coordinating care for right MCA stroke

## 2017-04-01 NOTE — Progress Notes (Signed)
PT Cancellation Note  Patient Details Name: Matthew Meza MRN: [id number] DOB: [birth year]   Cancelled Treatment:    Reason Eval/Treat Not Completed: Patient not medically ready.  Patient on vent.  Will continue to monitor.   Vena Austria 04/01/2017, 8:09 AM Durenda Hurt. Renaldo Fiddler, [facility] Acute Rehab Services Pager [phone]

## 2017-04-01 NOTE — Assessment & Plan Note (Addendum)
Improved agaitation  Plan Continue prn sedation If needed can do diprivan gtt

## 2017-04-01 NOTE — Progress Notes (Signed)
STROKE TEAM PROGRESS NOTE   HISTORY OF PRESENT ILLNESS (per record) Matthew Meza is a 76 y.o. male who was last in his normal state of health definitely yesterday evening when he interacted with someone at his facility. 4 AM, he got up and went to the bathroom and felt normal and that time. When he tried it up for more to the bathroom, however, he fell and was unable to get up. He was then unable to get to a phone for about 6 hours and once he finally did, he called 911 and was brought in as a code stroke.   LKW: 4/26 evening tpa given?: no, outside of window Modified Rankin Score: 0    SUBJECTIVE (INTERVAL HISTORY) No family members present. He remains intubated The patient is able to follow some commands.   OBJECTIVE Temp:  [97.4 F (36.3 C)-99.6 F (37.6 C)] 99.6 F (37.6 C) (04/28 1601) Pulse Rate:  [83-108] 86 (04/28 1615) Cardiac Rhythm: Normal sinus rhythm (04/28 1600) Resp:  [10-26] 13 (04/28 1615) BP: (101-125)/(45-68) 108/58 (04/28 1600) SpO2:  [98 %-100 %] 99 % (04/28 1615) FiO2 (%):  [40 %] (P) 40 % (04/28 1600) Weight:  [86.7 kg (191 lb 2.2 oz)] 86.7 kg (191 lb 2.2 oz) (04/28 0451)  CBC:   Recent Labs Lab 03/31/17 1040 03/31/17 1044 04/01/17 0441  WBC 11.5*  --  12.1*  NEUTROABS 9.8*  --   --   HGB 16.0 15.6 14.0  HCT 46.3 46.0 39.9  MCV 90.1  --  90.7  PLT 168  --  154    Basic Metabolic Panel:   Recent Labs Lab 03/31/17 1040 03/31/17 1044 04/01/17 0441  NA 139 140 139  K 4.0 4.0 4.3  CL 105 105 110  CO2 21*  --  21*  GLUCOSE 148* 151* 184*  BUN CREATININE 1.25* 1.10 1.18  CALCIUM 9.1  --  7.7*  MG  --   --  1.4*  PHOS  --   --  2.0*    Lipid Panel:     Component Value Date/Time   CHOL 148 04/01/2017 0442   TRIG 109 04/01/2017 0442   HDL 27 (L) 04/01/2017 0442   CHOLHDL 5.5 04/01/2017 0442   VLDL 22 04/01/2017 0442   LDLCALC 99 04/01/2017 0442   HgbA1c:  Lab Results  Component Value Date   HGBA1C 6.2  04/02/2015   Urine Drug Screen:     Component Value Date/Time   LABOPIA NONE DETECTED 12/13/2009 2102   COCAINSCRNUR NONE DETECTED 12/13/2009 2102   LABBENZ NONE DETECTED 12/13/2009 2102   AMPHETMU NONE DETECTED 12/13/2009 2102   THCU NONE DETECTED 12/13/2009 2102   LABBARB  12/13/2009 2102    NONE DETECTED        DRUG SCREEN FOR MEDICAL PURPOSES ONLY.  IF CONFIRMATION IS NEEDED FOR ANY PURPOSE, NOTIFY LAB WITHIN 5 DAYS.        LOWEST DETECTABLE LIMITS FOR URINE DRUG SCREEN Drug Class       Cutoff (ng/mL) Amphetamine      1000 Barbiturate      200 Benzodiazepine   200 Tricyclics       300 Opiates          300 Cocaine          300 THC              50    Alcohol Level     Component Value Date/Time  [facility]  12/13/2009 2140    <5        LOWEST DETECTABLE LIMIT FOR SERUM ALCOHOL IS 5 mg/dL FOR MEDICAL PURPOSES ONLY    IMAGING  Ct Angio Head W Or Wo Contrast Ct Angio Neck W Or Wo Contrast Ct Cerebral Perfusion W Contrast 03/31/2017 1. Positive for emergent large vessel occlusion, and favorable CT perfusion findings for endovascular re-perfusion.  2. Occlusion of the Right ICA just beyond its origin. Reconstitution of the Right ICA terminus beginning at the anterior genu (perhaps via the right ophthalmic artery). But superimposed Right MCA mid M1 segment occlusion.  3. CT perfusion indicates a core infarct of 40 mL in the Right MCA territory and right hemisphere penumbra of 146 mL.  4. The above was relayed to Dr. Ritta Slot beginning at 1059 hours.  5. Stenosis of the right ACA origin, but the right A1 segment may be non dominant. Right A2 and distal ACA enhancement is within normal limits. Normal left MCA and left ACA.  6. Aortic arch and bilateral carotid atherosclerosis without additional stenosis.  7. Mild left vertebral artery origin stenosis due to soft plaque. Otherwise negative posterior circulation.  8. Calcified coronary artery atherosclerosis.   Ct  Head Wo Contrast 04/01/2017 Developing bland and hemorrhagic infarction in the RIGHT hemisphere corresponding roughly to the area of infarcted core of brain on recent CT perfusion.    Ct Head Wo Contrast 03/31/2017 1. Extensive areas of hyperdensity involving the basal ganglia and right MCA territory cortex likely reflecting luxury perfusion associated with the areas of acute infarction following revascularization. Hemorrhage is not entirely excluded, but felt to be a minority of the hyperdensity visualized. The areas largely correspond to the areas of core infarction on the CTP study.  2. A tiny amount of subarachnoid hemorrhage may be present without intraventricular hemorrhage.      Ir US Guide Vasc Access Right 04/01/2017 IMPRESSION: Status post cerebral angiogram and treatment of tandem occlusion of the right ICA and right M1, with emergent stent system of the right ICA for treatment of dissection and intraluminal thrombus, and mechanical thrombectomy of the right MCA and carotid siphon, with restoration of TICI 3 flow. Deployment of Exoseal for hemostasis. Signed, Yvone Neu. Loreta Ave DO [facility] Radiology PLAN: Patient is stable to CT department for head CT Admit to neuro ICU. First 24 hours target systolic blood pressure of 120-140. Right hip straight for 6 hours status post removal of 8 French sheath. Electronically Signed   By: Gilmer Mor D.O.   On: 04/01/2017 11:23     Dg Chest Port 1 View 04/01/2017 Early bibasilar atelectasis similar to priors.   Dg Chest Port 1 View 03/31/2017 1. Unremarkable positioning of endotracheal and orogastric tubes. 2. Low volumes with mild atelectasis.     Ct Head Code Stroke W/o Cm 03/31/2017 1. Emergent right MCA large vessel occlusion suspected. No associated hemorrhage or mass effect.  2. ASPECTS is 9.    PHYSICAL EXAM Elderly Caucasian male who is intubated and sedated.  . Afebrile. Head is  nontraumatic. Neck is supple without bruit.    Cardiac exam no murmur or gallop. Lungs are clear to auscultation. Distal pulses are well felt.  Neurological Exam :   sedated intubated. Opens eyes and follows midline and commands on right side we.. Left gaze preference but able to look to right. Blinks to threat bilaterally. Fundi not visualized.left lower face weakness. Tongue midline. Left hemiplegia but able to  withdraw to pain lower extremity more than upper extremity. Purposeful antigravity movements on the right side. Left plantar upgoing right downgoing. ASSESSMENT/PLAN Matthew Meza is a 76 y.o. male with no significant past medical history presenting with left hemiparesis. He did not receive IV t-PA due to late presentation. Status post IR thrombectomy and stenting as above.  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 Superimposed Right MCA mid M1 segment occlusion  Carotid Doppler - CTA neck  2D Echo - pending  LDL - 99  HgbA1c pending   VTE prophylaxis - SCDs  Diet NPO time specified  No antithrombotic prior to admission, now on aspirin 81 mg daily (Plavix discontinued secondary to hemorrhage)  Patient will be counseled to be compliant with his antithrombotic medications  Ongoing aggressive stroke risk factor management  Therapy recommendations: pending  Disposition: Pending  Hypertension  Blood pressure tends to run low  Permissive hypertension (OK if < 220/120) but gradually normalize in 5-7 days  Long-term BP goal normotensive  Hyperlipidemia  Home meds: No lipid lowering medications prior to admission  LDL 99, goal < 70  Start Lipitor 40 mg daily when access is available  Continue statin at discharge    Other Stroke Risk Factors  Advanced age  The patient quit smoking 21 years ago.  Obesity, Body mass index is 30 kg/m.,  recommend weight loss, diet and exercise as appropriate   Family hx stroke (mother)    Other Active Problems  Hemorrhagic conversion -> discontinue Plavix - repeat head CT in a.m. - await MRI.  Electrolyte abnormalities - recheck in a.m.  Hyperglycemia - await hemoglobin A1c  Hospital day # 1  Delton See PA-C [facility] Pager [phone] 04/01/2017, 4:58 PM  I have personally examined this patient, reviewed notes, independently viewed imaging studies, participated in medical decision making and plan of care.ROS completed by me personally and pertinent positives fully documented  I have made any additions or clarifications directly to the above note. Agree with note above. He presented with left hemiplegia with tandem occlusions of right internal carotid and middle cerebral artery and underwent emergent recanalization with RICA rescue stent.Plan   CT head today shows hemorrhagic transformation  hence need to stop plavix and continue aspirin alone. Repeat head CT in [facility] extubation till after MRI.No family available for discussion. D/W Dr Marchelle Gearing PCCM MD This patient is critically ill and at significant risk of neurological worsening, death and care requires constant monitoring of vital signs, hemodynamics,respiratory and cardiac monitoring, extensive review of multiple databases, frequent neurological assessment, discussion with family, other specialists and medical decision making of high complexity.I have made any additions or clarifications directly to the above note.This critical care time does not reflect procedure time, or teaching time or supervisory time of PA/NP/Med Resident etc but could involve care discussion time.  I spent 30 minutes of neurocritical care time  in the care of  this patient.      Delia Heady, MD Medical Director [facility] Stroke Center Pager: [phone] 04/01/2017 5:54 PM   To contact Stroke Continuity provider, please refer to  [email or web address]. After hours, contact General Neurology

## 2017-04-02 ENCOUNTER — Inpatient Hospital Stay (HOSPITAL_COMMUNITY): Payer: Medicare HMO

## 2017-04-02 DIAGNOSIS — I63311 Cerebral infarction due to thrombosis of right middle cerebral artery: Secondary | ICD-10-CM

## 2017-04-02 DIAGNOSIS — I6789 Other cerebrovascular disease: Secondary | ICD-10-CM

## 2017-04-02 LAB — CBC WITH DIFFERENTIAL/PLATELET
Basophils Absolute: 0 10*3/uL (ref 0.0–0.1)
Basophils Relative: 0 %
EOS PCT: 1 %
Eosinophils Absolute: 0.1 10*3/uL (ref 0.0–0.7)
HEMATOCRIT: 35.5 % — AB (ref 39.0–52.0)
Hemoglobin: 12 g/dL — ABNORMAL LOW (ref 13.0–17.0)
LYMPHS ABS: 1.2 10*3/uL (ref 0.7–4.0)
LYMPHS PCT: 10 %
MCH: 30.6 pg (ref 26.0–34.0)
MCHC: 33.8 g/dL (ref 30.0–36.0)
MCV: 90.6 fL (ref 78.0–100.0)
MONO ABS: 1 10*3/uL (ref 0.1–1.0)
Monocytes Relative: 9 %
Neutro Abs: 9.7 10*3/uL — ABNORMAL HIGH (ref 1.7–7.7)
Neutrophils Relative %: 80 %
PLATELETS: 122 10*3/uL — AB (ref 150–400)
RBC: 3.92 MIL/uL — AB (ref 4.22–5.81)
RDW: 12.9 % (ref 11.5–15.5)
WBC: 11.9 10*3/uL — ABNORMAL HIGH (ref 4.0–10.5)

## 2017-04-02 LAB — BASIC METABOLIC PANEL
Anion gap: 7 (ref 5–15)
BUN: 20 mg/dL (ref 6–20)
CALCIUM: 7.7 mg/dL — AB (ref 8.9–10.3)
CO2: 22 mmol/L (ref 22–32)
CREATININE: 1.14 mg/dL (ref 0.61–1.24)
Chloride: 111 mmol/L (ref 101–111)
GFR calc Af Amer: >60 mL/min (ref >60)
GFR calc non Af Amer: >60 mL/min (ref >60)
GLUCOSE: 164 mg/dL — AB (ref 65–99)
Potassium: 3.7 mmol/L (ref 3.5–5.1)
Sodium: 140 mmol/L (ref 135–145)

## 2017-04-02 LAB — PHOSPHORUS: Phosphorus: 1.9 mg/dL — ABNORMAL LOW (ref 2.5–4.6)

## 2017-04-02 LAB — HEMOGLOBIN A1C
HEMOGLOBIN A1C: 6.2 % — AB (ref 4.8–5.6)
Mean Plasma Glucose: 131 mg/dL

## 2017-04-02 LAB — MAGNESIUM: Magnesium: 2.2 mg/dL (ref 1.7–2.4)

## 2017-04-02 MED ORDER — ORAL CARE MOUTH RINSE
15.0000 mL | Freq: Two times a day (BID) | OROMUCOSAL | Status: DC
  Administered 2017-04-02 – 2017-04-12 (×19): 15 mL via OROMUCOSAL

## 2017-04-02 MED ORDER — POTASSIUM PHOSPHATES 15 MMOLE/5ML IV SOLN
24.0000 mmol | Freq: Once | INTRAVENOUS | Status: AC
  Administered 2017-04-02: 24 mmol via INTRAVENOUS
  Filled 2017-04-02: qty 8

## 2017-04-02 MED ORDER — ORAL CARE MOUTH RINSE: 15.0000 mL | Freq: Two times a day (BID) | OROMUCOSAL | Status: DC

## 2017-04-02 MED ORDER — CHLORHEXIDINE GLUCONATE 0.12 % MT SOLN: 15.0000 mL | Freq: Two times a day (BID) | OROMUCOSAL | Status: DC

## 2017-04-02 NOTE — Progress Notes (Signed)
Patient transported on vent from [id number]-[id number] to MRI and back without complication.

## 2017-04-02 NOTE — Progress Notes (Signed)
STROKE TEAM PROGRESS NOTE   HISTORY OF PRESENT ILLNESS (per record) Matthew Meza is a 76 y.o. male who was last in his normal state of health definitely yesterday evening when he interacted with someone at his facility. 4 AM, he got up and went to the bathroom and felt normal and that time. When he tried it up for more to the bathroom, however, he fell and was unable to get up. He was then unable to get to a phone for about 6 hours and once he finally did, he called 911 and was brought in as a code stroke.   LKW: 4/26 evening tpa given?: no, outside of window Modified Rankin Score: 0    SUBJECTIVE (INTERVAL HISTORY) No family members present. He remains intubated The patient is able to follow some commands.MRI scan shows moderate size RMCA infarct with hemorrhagic transformation but no midline shift   OBJECTIVE Temp:  [98.4 F (36.9 C)-99.6 F (37.6 C)] 99.5 F (37.5 C) (04/29 0405) Pulse Rate:  [79-103] 80 (04/29 0800) Cardiac Rhythm: Normal sinus rhythm (04/29 0800) Resp:  [10-26] 15 (04/29 0800) BP: (101-152)/(45-66) 152/61 (04/29 0334) SpO2:  [95 %-100 %] 95 % (04/29 0800) FiO2 (%):  [40 %] 40 % (04/29 0800) Weight:  [88.3 kg (194 lb 10.7 oz)] 88.3 kg (194 lb 10.7 oz) (04/29 0416)  CBC:   Recent Labs Lab 03/31/17 1040  04/01/17 0441 04/02/17 0330  WBC 11.5*  --  12.1* 11.9*  NEUTROABS 9.8*  --   --  9.7*  HGB 16.0  < > 14.0 12.0*  HCT 46.3  < > 39.9 35.5*  MCV 90.1  --  90.7 90.6  PLT 168  --  154 122*  < > = values in this interval not displayed.  Basic Metabolic Panel:   Recent Labs Lab 04/01/17 0441 04/02/17 0055 04/02/17 0330  NA 139 140  --   K 4.3 3.7  --   CL 110 111  --   CO2 21* 22  --   GLUCOSE 184* 164*  --   BUN 13 20  --   CREATININE 1.18 1.14  --   CALCIUM 7.7* 7.7*  --   MG 1.4*  --  2.2  PHOS 2.0*  --  1.9*    Lipid Panel:     Component Value Date/Time   CHOL 148 04/01/2017 0442   TRIG 109 04/01/2017 0442   HDL 27 (L)  04/01/2017 0442   CHOLHDL 5.5 04/01/2017 0442   VLDL 22 04/01/2017 0442   LDLCALC 99 04/01/2017 0442   HgbA1c:  Lab Results  Component Value Date   HGBA1C 6.2 04/02/2015   Urine Drug Screen:     Component Value Date/Time   LABOPIA NONE DETECTED 12/13/2009 2102   COCAINSCRNUR NONE DETECTED 12/13/2009 2102   LABBENZ NONE DETECTED 12/13/2009 2102   AMPHETMU NONE DETECTED 12/13/2009 2102   THCU NONE DETECTED 12/13/2009 2102   LABBARB  12/13/2009 2102    NONE DETECTED        DRUG SCREEN FOR MEDICAL PURPOSES ONLY.  IF CONFIRMATION IS NEEDED FOR ANY PURPOSE, NOTIFY LAB WITHIN 5 DAYS.        LOWEST DETECTABLE LIMITS FOR URINE DRUG SCREEN Drug Class       Cutoff (ng/mL) Amphetamine      1000 Barbiturate      200 Benzodiazepine   200 Tricyclics       300 Opiates          300 Cocaine  300 THC              50    Alcohol Level     Component Value Date/Time   [facility]  12/13/2009 2140    <5        LOWEST DETECTABLE LIMIT FOR SERUM ALCOHOL IS 5 mg/dL FOR MEDICAL PURPOSES ONLY    IMAGING  Ct Angio Head W Or Wo Contrast Ct Angio Neck W Or Wo Contrast Ct Cerebral Perfusion W Contrast 03/31/2017 1. Positive for emergent large vessel occlusion, and favorable CT perfusion findings for endovascular re-perfusion.  2. Occlusion of the Right ICA just beyond its origin. Reconstitution of the Right ICA terminus beginning at the anterior genu (perhaps via the right ophthalmic artery). But superimposed Right MCA mid M1 segment occlusion.  3. CT perfusion indicates a core infarct of 40 mL in the Right MCA territory and right hemisphere penumbra of 146 mL.  4. The above was relayed to Dr. Ritta Slot beginning at 1059 hours.  5. Stenosis of the right ACA origin, but the right A1 segment may be non dominant. Right A2 and distal ACA enhancement is within normal limits. Normal left MCA and left ACA.  6. Aortic arch and bilateral carotid atherosclerosis without additional stenosis.   7. Mild left vertebral artery origin stenosis due to soft plaque. Otherwise negative posterior circulation.  8. Calcified coronary artery atherosclerosis.   Ct Head Wo Contrast 04/01/2017 Developing bland and hemorrhagic infarction in the RIGHT hemisphere corresponding roughly to the area of infarcted core of brain on recent CT perfusion.    Ct Head Wo Contrast 03/31/2017 1. Extensive areas of hyperdensity involving the basal ganglia and right MCA territory cortex likely reflecting luxury perfusion associated with the areas of acute infarction following revascularization. Hemorrhage is not entirely excluded, but felt to be a minority of the hyperdensity visualized. The areas largely correspond to the areas of core infarction on the CTP study.  2. A tiny amount of subarachnoid hemorrhage may be present without intraventricular hemorrhage.      Ir US Guide Vasc Access Right 04/01/2017 IMPRESSION: Status post cerebral angiogram and treatment of tandem occlusion of the right ICA and right M1, with emergent stent system of the right ICA for treatment of dissection and intraluminal thrombus, and mechanical thrombectomy of the right MCA and carotid siphon, with restoration of TICI 3 flow. Deployment of Exoseal for hemostasis. Signed, Yvone Neu. Loreta Ave DO [facility] Radiology PLAN: Patient is stable to CT department for head CT Admit to neuro ICU. First 24 hours target systolic blood pressure of 120-140. Right hip straight for 6 hours status post removal of 8 French sheath. Electronically Signed   By: Gilmer Mor D.O.   On: 04/01/2017 11:23     Dg Chest Port 1 View 04/01/2017 Early bibasilar atelectasis similar to priors.   Dg Chest Port 1 View 03/31/2017 1. Unremarkable positioning of endotracheal and orogastric tubes. 2. Low volumes with mild atelectasis.     Ct Head Code Stroke W/o Cm 03/31/2017 1. Emergent right MCA large vessel occlusion  suspected. No associated hemorrhage or mass effect.  2. ASPECTS is 9.    PHYSICAL EXAM Elderly Caucasian male who is intubated and sedated.  . Afebrile. Head is nontraumatic. Neck is supple without bruit.    Cardiac exam no murmur or gallop. Lungs are clear to auscultation. Distal pulses are well felt.  Neurological Exam :   sedated intubated. Opens eyes and follows midline and commands  on right side  .Marland Kitchen Left gaze preference but able to look to right. Blinks to threat bilaterally. Fundi not visualized.left lower face weakness. Tongue midline. Left hemiplegia but able to withdraw to pain lower extremity more than upper extremity. Purposeful antigravity movements on the right side. Left plantar upgoing right downgoing. ASSESSMENT/PLAN Mr. KAZI REPPOND is a 76 y.o. male with no significant past medical history presenting with left hemiparesis. He did not receive IV t-PA due to late presentation. Status post IR thrombectomy and stenting as above.  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 Superimposed Right MCA mid M1 segment occlusion  Carotid Doppler - CTA neck  2D Echo - pending  LDL - 99  HgbA1c pending   VTE prophylaxis - SCDs  Diet NPO time specified  No antithrombotic prior to admission, now on aspirin 81 mg daily (Plavix discontinued secondary to hemorrhage)  Patient will be counseled to be compliant with his antithrombotic medications  Ongoing aggressive stroke risk factor management  Therapy recommendations: pending  Disposition: Pending  Hypertension  Blood pressure tends to run low  Permissive hypertension (OK if < 220/120) but gradually normalize in 5-7 days  Long-term BP goal normotensive  Hyperlipidemia  Home meds: No lipid lowering medications prior to admission  LDL 99, goal < 70  Start Lipitor 40 mg daily when access is  available  Continue statin at discharge    Other Stroke Risk Factors  Advanced age  The patient quit smoking 21 years ago.  Obesity, Body mass index is 30.55 kg/m., recommend weight loss, diet and exercise as appropriate   Family hx stroke (mother)    Other Active Problems  Hemorrhagic conversion -> discontinue Plavix - repeat head CT in a.m. - await MRI.  Electrolyte abnormalities - recheck in a.m.  Hyperglycemia - await hemoglobin A1c  Hospital day # 2  I have personally examined this patient, reviewed notes, independently viewed imaging studies, participated in medical decision making and plan of care.ROS completed by me personally and pertinent positives fully documented  I have made any additions or clarifications directly to the above note.  He presented with left hemiplegia with tandem occlusions of right internal carotid and middle cerebral artery and underwent emergent recanalization with RICA rescue stent.Plan     extubation today after d/w family and clarification of code status and reintubation needs if necessary. D/W Dr Marchelle Gearing.No family available for discussion.  This patient is critically ill and at significant risk of neurological worsening, death and care requires constant monitoring of vital signs, hemodynamics,respiratory and cardiac monitoring, extensive review of multiple databases, frequent neurological assessment, discussion with family, other specialists and medical decision making of high complexity.I have made any additions or clarifications directly to the above note.This critical care time does not reflect procedure time, or teaching time or supervisory time of PA/NP/Med Resident etc but could involve care discussion time.  I spent 30 minutes of neurocritical care time  in the care of  this patient.    Delia Heady, MD Medical Director [facility] Stroke Center Pager: [phone] 04/02/2017 1:47 PM    To contact Stroke Continuity provider, please  refer to [email or web address]. After hours, contact General Neurology

## 2017-04-02 NOTE — Progress Notes (Signed)
I spoke with his daughter Kathie Rhodes today to gather more information and give him an update. He lived in a motel and is fairly distant from his family though his sister stays in touch with him infrequently. [place] and is working on getting here tomorrow to see her father. He is not on good terms with her though his sister feels she should be involved in her father's medical decision-making.  Before this admission, he was independent in all his ADLs. Multiple siblings in their family have had strokes, so she is familiar with the course of them.   I reviewed with her our plan to extubate today and his high risk of aspiration. She acknowledges he would not want to live dependent on a machine and would like to discuss more with his daughter.   For now, we will extubate and leave him a full code with plan to revisit tomorrow with family.

## 2017-04-02 NOTE — Consult Note (Addendum)
PULMONARY / CRITICAL CARE MEDICINE   Name: Matthew Meza MRN: [id number] DOB: [birth year]    ADMISSION DATE:  03/31/2017 CONSULTATION DATE:  03/31/17  REFERRING MD:  Dr. Amada Jupiter  CHIEF COMPLAINT:  acute respiratory failure  brief   [name] is a 76 year old man hospitalized for right MCA stroke. History was collected from chart review as the patient is intubated. He presented to ED with left sided weakness and garbled speech. He was last seen normal yesterday evening at his living facility. Around 4AM, he got up and went to the bathroom but fell and could not get up. Six hours later, he called 911 and was transported as code stroke. In the ED, CTA head/neck was notable for right ICA occlusion just beyond the origin and right ACA stenosis  He was transported to IR for revascularization. PCCM was consulted for vent management.  EVENTs 03/31/2017 - admit stroke vent IR  04/01/2017 - agitated on vent with Rt side movement. -> versed prn and now sedated. Doing SBT . Afebrile.     SUBJECTIVE/OVERNIGHT/INTERVAL HX 4/29   - just started sbt. On prn sedation -> Rass -1/-2 and follows on right side. Calm. CT head yesterday pm with some evoluiton of hge. MRI later showed stability large Rt MCA infract with surroundibng hge  VITAL SIGNS: BP (!) 152/61   Pulse 85   Temp 99.5 F (37.5 C) (Oral)   Resp 20   Ht 5' 6.93" (1.7 m)   Wt 88.3 kg (194 lb 10.7 oz)   SpO2 96%   BMI 30.55 kg/m   HEMODYNAMICS:    VENTILATOR SETTINGS: Vent Mode: PRVC FiO2 (%):  [40 %] 40 % Set Rate:  [12 bmp] 12 bmp Vt Set:  [650 mL] 650 mL PEEP:  [5 cmH20-10 cmH20] 5 cmH20 Pressure Support:  [5 cmH20] 5 cmH20 Plateau Pressure:  [17 cmH20-21 cmH20] 21 cmH20  INTAKE / OUTPUT: I/O last 3 completed shifts: In: 4680.2 [I.V.:4114.2; IV Piggyback:566] Out: 1400 [Urine:1400]  PHYSICAL EXAMINATION: Physical Exam  Constitutional: He is well-developed, well-nourished, and in no distress. No distress.   Elderly man  HENT:  Head: Normocephalic and atraumatic.  Eyes: Conjunctivae are normal. Pupils are equal, round, and reactive to light. Right eye exhibits no discharge. Left eye exhibits no discharge.  Neck: No JVD present.  Cardiovascular: Normal rate, regular rhythm and normal heart sounds.  Exam reveals no gallop.   No murmur heard. Pulmonary/Chest: Effort normal. No respiratory distress.  Sync wth vent  Abdominal: Soft. He exhibits no distension and no mass. There is no rebound and no guarding.  Lymphadenopathy:    He has no cervical adenopathy.  Neurological:  rass -1/-2 after prn versed Rt side movement  Some movement in distal LLE Folows commands oriented  Skin: Skin is warm and dry. He is not diaphoretic.        LABS  PULMONARY  Recent Labs Lab 03/31/17 1044  TCO2 25    CBC  Recent Labs Lab 03/31/17 1040 03/31/17 1044 04/01/17 0441 04/02/17 0330  HGB 16.0 15.6 14.0 12.0*  HCT 46.3 46.0 39.9 35.5*  WBC 11.5*  --  12.1* 11.9*  PLT 168  --  154 122*    COAGULATION  Recent Labs Lab 03/31/17 1040  INR 1.07    CARDIAC  No results for input(s): TROPONINI in the last 168 hours. No results for input(s): PROBNP in the last 168 hours.   CHEMISTRY  Recent Labs Lab 03/31/17 1040 03/31/17 1044 04/01/17 0441 04/02/17  0055 04/02/17 0330  NA 139 140 139 140  --   K 4.0 4.0 4.3 3.7  --   CL 105 105 110 111  --   CO2 21*  --  21* 22  --   GLUCOSE 148* 151* 184* 164*  --   BUN 10 11 13 20   --   CREATININE 1.25* 1.10 1.18 1.14  --   CALCIUM 9.1  --  7.7* 7.7*  --   MG  --   --  1.4*  --  2.2  PHOS  --   --  2.0*  --  1.9*   Estimated Creatinine Clearance: 59.3 mL/min (by C-G formula based on SCr of 1.14 mg/dL).   LIVER  Recent Labs Lab 03/31/17 1040  AST 31  ALT 29  ALKPHOS 61  BILITOT 0.9  PROT 6.8  ALBUMIN 4.0  INR 1.07     INFECTIOUS No results for input(s): LATICACIDVEN, PROCALCITON in the last 168  hours.   ENDOCRINE CBG (last 3)   Recent Labs  03/31/17 1040 03/31/17 1617  GLUCAP 140* 91         IMAGING x48h  - image(s) personally visualized  -   highlighted in bold Ct Angio Head W Or Wo Contrast  Result Date: 03/31/2017 CLINICAL DATA:  76 year old male with left side weakness and evidence of right MCA emergent large vessel occlusion on noncontrast head CT. EXAM: CT ANGIOGRAPHY HEAD AND NECK CT PERFUSION BRAIN TECHNIQUE: Multidetector CT imaging of the head and neck was performed using the standard protocol during bolus administration of intravenous contrast. Multiplanar CT image reconstructions and MIPs were obtained to evaluate the vascular anatomy. Carotid stenosis measurements (when applicable) are obtained utilizing NASCET criteria, using the distal internal carotid diameter as the denominator. Multiphase CT imaging of the brain was performed following IV bolus contrast injection. Subsequent parametric perfusion maps were calculated using RAPID software. CONTRAST:  90 mL Isovue 370 COMPARISON:  Noncontrast head CT 1048 hours today. FINDINGS: CT Brain Perfusion Findings: CBF (<30%) Volume: 40 mL (using CBF less than 30%). Perfusion (Tmax>6.0s) volume: 185 mL (T-max greater than 6 seconds). Mismatch Volume: 146 mL (mismatch  ratio 4.6). Infarction Location:Right MCA Perfusion findings were reviewed in person with Dr. Ritta Slot at 1059 hours. CTA NECK Skeleton: No acute osseous abnormality identified. Age concordant degenerative changes in the cervical spine. Intermittent carious dentition. Upper chest: Negative; no superior mediastinal lymphadenopathy. There is calcified coronary artery atherosclerosis. Other neck: Negative thyroid, larynx, pharynx, parapharyngeal spaces, retropharyngeal space, sublingual space, submandibular glands and parotid glands. No cervical lymphadenopathy. Aortic arch: Moderate soft and atherosclerotic arch atherosclerosis. Three vessel arch  configuration. Right carotid system: No brachiocephalic artery or right CCA origin stenosis. The right carotid bifurcation is patent, but the right ICA origin is occluded abruptly 9 mm beyond its origin (series 10, image 39). The right ICA remains occluded to the skull base. Left carotid system: No left CCA origin stenosis. Medial soft plaque throughout the left CCA at the level of the thyroid, not hemodynamically significant. Soft and calcified plaque at the left ICA origin and bulb, not hemodynamically significant. Mid and distal cervical left ICA air negative. Vertebral arteries:No proximal right subclavian artery stenosis. Normal right vertebral artery origin. Negative cervical right vertebral artery. No proximal left subclavian artery stenosis despite soft plaque. Soft plaque at the left vertebral artery origin and V1 segment with only mild stenosis. Otherwise negative cervical left vertebral artery. CTA HEAD Posterior circulation: Codominant distal vertebral arteries without stenosis.  Patent PICA origins. Patent vertebrobasilar junction. No basilar stenosis. Normal SCA and PCA origins. Posterior communicating arteries are diminutive or absent. Mild bilateral P1 and P2 segment irregularity, no PCA stenosis identified. Anterior circulation: Left ICA siphon is patent with mild calcified plaque and no stenosis. Left ICA terminus, left MCA and left ACA origins are normal. Right ICA siphon is occluded through the cavernous segment. Beginning at the anterior genu there is reconstitution of the right ICA siphon, and the right ICA terminus is patent. The right MCA origin is patent but the mid M1 segment is occluded about 11 mm from the origin. There is minimal reconstituted enhancement in the proximal M2 segments. The right ACA origin is patent but diminutive and appears somewhat stenotic (series 8, image 89). The left A1 segment may be dominant. A patent anterior communicating artery is identified. The bilateral ACA  A2 segments and distal ACA branches appear symmetric and within normal limits. Venous sinuses: Patent. Anatomic variants: Possibly dominant left ACA A1 segment. Review of the MIP images confirms the above findings IMPRESSION: 1. Positive for emergent large vessel occlusion, and favorable CT perfusion findings for endovascular re-perfusion. 2. Occlusion of the Right ICA just beyond its origin. Reconstitution of the Right ICA terminus beginning at the anterior genu (perhaps via the right ophthalmic artery). But superimposed Right MCA mid M1 segment occlusion. 3. CT perfusion indicates a core infarct of 40 mL in the Right MCA territory and right hemisphere penumbra of 146 mL. 4. The above was relayed to Dr. Ritta Slot beginning at 1059 hours. 5. Stenosis of the right ACA origin, but the right A1 segment may be non dominant. Right A2 and distal ACA enhancement is within normal limits. Normal left MCA and left ACA. 6. Aortic arch and bilateral carotid atherosclerosis without additional stenosis. 7. Mild left vertebral artery origin stenosis due to soft plaque. Otherwise negative posterior circulation. 8. Calcified coronary artery atherosclerosis. Electronically Signed   By: Odessa Fleming M.D.   On: 03/31/2017 11:22   Ct Head Wo Contrast  Result Date: 04/01/2017 CLINICAL DATA:  Continued surveillance RIGHT ICA and MCA occlusion. LEFT-sided weakness. EXAM: CT HEAD WITHOUT CONTRAST TECHNIQUE: Contiguous axial images were obtained from the base of the skull through the vertex without intravenous contrast. COMPARISON:  Multiple priors. Most recent CT head 03/31/2017. CTA head neck and CT perfusion 03/31/2017. FINDINGS: Brain: There is developing bland and hemorrhagic infarction in the RIGHT hemisphere, corresponding roughly to the area of infarcted core of brain on CT perfusion, CBF < 30%. Along the inferior RIGHT frontal lobe and insula, images 13-15, developing hypoattenuation represents cytotoxic edema. More  superiorly, in the RIGHT lentiform nucleus extending toward the centrum semiovale, images 16-19, cloud-like hyperattenuation, in the setting of washout of previous iodinated contrast/blood pool, is consistent with petechial hemorrhage within infarcted tissue. No subarachnoid blood. Mild mass effect on the RIGHT frontal horn, but insignificant RIGHT-to-LEFT shift. No extra-axial collection. Vascular: No hyperdense vessel or unexpected calcification. Skull: Normal. Negative for fracture or focal lesion. Sinuses/Orbits: No acute finding. Other: None. IMPRESSION: Developing bland and hemorrhagic infarction in the RIGHT hemisphere corresponding roughly to the area of infarcted core of brain on recent CT perfusion, see discussion above. Electronically Signed   By: Elsie Stain M.D.   On: 04/01/2017 15:18   Ct Head Wo Contrast  Result Date: 03/31/2017 CLINICAL DATA:  Status post revascularization of the occluded right internal carotid artery and right middle cerebral artery. Left-sided weakness. EXAM: CT HEAD WITHOUT CONTRAST TECHNIQUE: Contiguous  axial images were obtained from the base of the skull through the vertex without intravenous contrast. COMPARISON:  CT head without contrast from the same day. FINDINGS: Brain: Hyperdensity is present within the right lentiform nucleus along the area of core infarct. There is also hyperdensity along the body of the body. Hyperdense cortex is present in the right frontal operculum. There is focal density along the posterior watershed territory and over the right frontal convexity. The left basal ganglia are intact. No acute or focal left-sided infarct is present. There is some swelling of the sulci on the right. A small amount of subarachnoid blood may present on the right. The ventricles are of normal size. There is no intraventricular hemorrhage. No extra-axial hemorrhage is present on the left. The brainstem and cerebellum are normal. Vascular: Atherosclerotic  calcifications are present in the cavernous internal carotid arteries. Contrast is present within the major intracranial arteries and dural sinuses. Skull: The calvarium is intact. Sinuses/Orbits: Extensive sinus disease is again noted. Fluid is present in the nasopharynx, likely associated with intubation. This has increased. IMPRESSION: 1. Extensive areas of hyperdensity involving the basal ganglia and right MCA territory cortex likely reflecting luxury perfusion associated with the areas of acute infarction following revascularization. Hemorrhage is not entirely excluded, but felt to be a minority of the hyperdensity visualized. The areas largely correspond to the areas of core infarction on the CTP study. 2. A tiny amount of subarachnoid hemorrhage may be present without intraventricular hemorrhage. Electronically Signed   By: Marin Roberts M.D.   On: 03/31/2017 16:02   Ct Angio Neck W Or Wo Contrast  Result Date: 03/31/2017 CLINICAL DATA:  76 year old male with left side weakness and evidence of right MCA emergent large vessel occlusion on noncontrast head CT. EXAM: CT ANGIOGRAPHY HEAD AND NECK CT PERFUSION BRAIN TECHNIQUE: Multidetector CT imaging of the head and neck was performed using the standard protocol during bolus administration of intravenous contrast. Multiplanar CT image reconstructions and MIPs were obtained to evaluate the vascular anatomy. Carotid stenosis measurements (when applicable) are obtained utilizing NASCET criteria, using the distal internal carotid diameter as the denominator. Multiphase CT imaging of the brain was performed following IV bolus contrast injection. Subsequent parametric perfusion maps were calculated using RAPID software. CONTRAST:  90 mL Isovue 370 COMPARISON:  Noncontrast head CT 1048 hours today. FINDINGS: CT Brain Perfusion Findings: CBF (<30%) Volume: 40 mL (using CBF less than 30%). Perfusion (Tmax>6.0s) volume: 185 mL (T-max greater than 6 seconds).  Mismatch Volume: 146 mL (mismatch  ratio 4.6). Infarction Location:Right MCA Perfusion findings were reviewed in person with Dr. Ritta Slot at 1059 hours. CTA NECK Skeleton: No acute osseous abnormality identified. Age concordant degenerative changes in the cervical spine. Intermittent carious dentition. Upper chest: Negative; no superior mediastinal lymphadenopathy. There is calcified coronary artery atherosclerosis. Other neck: Negative thyroid, larynx, pharynx, parapharyngeal spaces, retropharyngeal space, sublingual space, submandibular glands and parotid glands. No cervical lymphadenopathy. Aortic arch: Moderate soft and atherosclerotic arch atherosclerosis. Three vessel arch configuration. Right carotid system: No brachiocephalic artery or right CCA origin stenosis. The right carotid bifurcation is patent, but the right ICA origin is occluded abruptly 9 mm beyond its origin (series 10, image 39). The right ICA remains occluded to the skull base. Left carotid system: No left CCA origin stenosis. Medial soft plaque throughout the left CCA at the level of the thyroid, not hemodynamically significant. Soft and calcified plaque at the left ICA origin and bulb, not hemodynamically significant. Mid and distal cervical  left ICA air negative. Vertebral arteries:No proximal right subclavian artery stenosis. Normal right vertebral artery origin. Negative cervical right vertebral artery. No proximal left subclavian artery stenosis despite soft plaque. Soft plaque at the left vertebral artery origin and V1 segment with only mild stenosis. Otherwise negative cervical left vertebral artery. CTA HEAD Posterior circulation: Codominant distal vertebral arteries without stenosis. Patent PICA origins. Patent vertebrobasilar junction. No basilar stenosis. Normal SCA and PCA origins. Posterior communicating arteries are diminutive or absent. Mild bilateral P1 and P2 segment irregularity, no PCA stenosis identified.  Anterior circulation: Left ICA siphon is patent with mild calcified plaque and no stenosis. Left ICA terminus, left MCA and left ACA origins are normal. Right ICA siphon is occluded through the cavernous segment. Beginning at the anterior genu there is reconstitution of the right ICA siphon, and the right ICA terminus is patent. The right MCA origin is patent but the mid M1 segment is occluded about 11 mm from the origin. There is minimal reconstituted enhancement in the proximal M2 segments. The right ACA origin is patent but diminutive and appears somewhat stenotic (series 8, image 89). The left A1 segment may be dominant. A patent anterior communicating artery is identified. The bilateral ACA A2 segments and distal ACA branches appear symmetric and within normal limits. Venous sinuses: Patent. Anatomic variants: Possibly dominant left ACA A1 segment. Review of the MIP images confirms the above findings IMPRESSION: 1. Positive for emergent large vessel occlusion, and favorable CT perfusion findings for endovascular re-perfusion. 2. Occlusion of the Right ICA just beyond its origin. Reconstitution of the Right ICA terminus beginning at the anterior genu (perhaps via the right ophthalmic artery). But superimposed Right MCA mid M1 segment occlusion. 3. CT perfusion indicates a core infarct of 40 mL in the Right MCA territory and right hemisphere penumbra of 146 mL. 4. The above was relayed to Dr. Ritta Slot beginning at 1059 hours. 5. Stenosis of the right ACA origin, but the right A1 segment may be non dominant. Right A2 and distal ACA enhancement is within normal limits. Normal left MCA and left ACA. 6. Aortic arch and bilateral carotid atherosclerosis without additional stenosis. 7. Mild left vertebral artery origin stenosis due to soft plaque. Otherwise negative posterior circulation. 8. Calcified coronary artery atherosclerosis. Electronically Signed   By: Odessa Fleming M.D.   On: 03/31/2017 11:22   Mr  Brain Wo Contrast  Result Date: 04/01/2017 CLINICAL DATA:  Follow-up RIGHT MCA stroke. EXAM: MRI HEAD WITHOUT CONTRAST TECHNIQUE: Multiplanar, multiecho pulse sequences of the brain and surrounding structures were obtained without intravenous contrast. COMPARISON:  CT HEAD April 01, 2017 at 1447 hours FINDINGS: BRAIN: Confluent reduced diffusion RIGHT frontal lobes with smaller foci of reduced diffusion RIGHT parietal and temporal lobe, intermediate to low ADC values. Susceptibility artifact RIGHT basal ganglia and insula corresponding to CT findings without further propagation by MRI. FLAIR T2 hyperintense signal within areas of acute infarction with regional mass effect, no significant midline shift. Partial effacement of the RIGHT lateral ventricle without hydrocephalus. Patchy additional supratentorial white matter FLAIR T2 hyperintensities compatible with mild chronic small vessel ischemic disease. No abnormal extra-axial fluid collections. VASCULAR: Normal major intracranial vascular flow voids present at skull base. SKULL AND UPPER CERVICAL SPINE: No abnormal sellar expansion. No suspicious calvarial bone marrow signal. Craniocervical junction maintained. SINUSES/ORBITS: Moderate paranasal sinus mucosal thickening. Mastoid air cells are well aerated. The included ocular globes and orbital contents are non-suspicious. OTHER: Life-support lines in place. IMPRESSION: Acute large RIGHT MCA  territory infarct with similar petechial hemorrhage. Otherwise negative noncontrast MRI head for age. Electronically Signed   By: Awilda Metro M.D.   On: 04/01/2017 23:12   Ir US Guide Vasc Access Right  Result Date: 04/01/2017 INDICATION: 76 year old male with a history of right MCA syndrome, with imaging workup revealing acute right ICA occlusion and right MCA occlusion (tandem occlusion), with increased NIH stroke scale, and aspects of 9. EXAM: ULTRASOUND GUIDED ACCESS RIGHT COMMON FEMORAL ARTERY. CEREBRAL  ANGIOGRAM. REVASCULARIZATION OF OCCLUDED RIGHT INTERNAL CAROTID ARTERY AT THE BIFURCATION WITH BALLOON ANGIOPLASTY AND EMERGENT STENTING MECHANICAL THROMBECTOMY RIGHT MCA OCCLUSION DEPLOYMENT OF CLOSURE DEVICE FOR HEMOSTASIS COMPARISON:  CT 03/31/2017, CT angiogram 03/31/2017 MEDICATIONS: 2.0 g Ancef. The antibiotic was administered within 1 hour of the procedure ANESTHESIA/SEDATION: General anesthesia CONTRAST:  180 cc Isovue FLUOROSCOPY TIME:  Fluoroscopy Time: 44 minutes 18 seconds (3,731 mGy). COMPLICATIONS: None TECHNIQUE: Emergent informed written consent was obtained after discussion with the patient who confirmed he has no family available and no friends to speak on his behalf. Angiogram and thrombectomy was performed as the standard of care for a patient presentation such as his, after a discussion with the patient regarding risks and benefits. He did acknowledge wishing to proceed to be helped. Specific risks discussed include: Bleeding, infection, contrast reaction, kidney injury/failure, need for further procedure/surgery, arterial injury or dissection, embolization to new territory, intracranial hemorrhage (10-15% risk), neurologic deterioration, cardiopulmonary collapse, death. Maximal Sterile Barrier Technique was utilized including caps, mask, sterile gowns, sterile gloves, sterile drape, hand hygiene and skin antiseptic. A timeout was performed prior to the initiation of the procedure. PROCEDURE: Maximal Sterile Barrier Technique was utilized including during the procedure including caps, mask, sterile gowns, sterile gloves, sterile drape, hand hygiene and skin antiseptic. A timeout was performed prior to the initiation of the procedure. Ultrasound survey of the right inguinal region was performed with images stored and sent to PACs. A micropuncture needle was used access the right common femoral artery under ultrasound. With excellent arterial blood flow returned, an .018 micro wire was passed  through the needle, observed to enter the abdominal aorta under fluoroscopy. The needle was removed, and a micropuncture sheath was placed over the wire. The inner dilator and wire were removed, and an 035 Bentson wire was advanced under fluoroscopy into the abdominal aorta. The sheath was removed and a standard 5 [place] vascular sheath was placed. The dilator was removed and the sheath was flushed. A 33F JB-1 diagnostic catheter was advanced over the wire to the proximal descending thoracic aorta. Wire was then removed. Double flush of the catheter was performed. Catheter was then used to select the innominate artery. Angiogram was performed. Catheter was then advanced into the right common carotid artery. Formal angiogram was performed. Exchange length Rosen wire was then passed through the diagnostic catheter to the distal common carotid artery and the diagnostic catheter was removed. The 5 French sheath was removed and exchanged for 8 French 55 centimeter BrightTip sheath. Sheath was flushed and attached to pressurized and heparinized saline bag for constant forward flow. Then a NeuroMax 80cm catheter was then advanced over the wire, positioned into the distal common carotid artery. Copious back flush was performed and the balloon catheter was attached to heparinized and pressurized saline bag for forward flow. . With the tip of the neuro on max position in the distal common carotid artery, micro wire and microcatheter were selected in attempt to cross the lesion at the bifurcation. A soft tipped Transcend  wire and rapid transit microcatheter were selected. After the wire was navigated across the lesion, the microcatheter encountered significant resistance. The tension in the system caused the neuro on max catheter to buckle towards the aortic arch. A tandem wire using a roadrunner 035 wire was then placed adjacent to the microcatheter system with the tip of the roadrunner into the external carotid artery. With  the buddy wire in place, the microcatheter was pushed across the lesion in the cervical carotid. After successfully crossing the lesion at the bifurcation, the wire and microcatheter were position in the distal cervical segment of the ICA. The wire was removed and contrast was infused to confirm intraluminal location. Exchange length Transcend wire was then placed through the microcatheter terminating at the skullbase. The microcatheter was removed, and a 4 mm by 30 mm rapid exchange system Viatrak angioplasty balloon was selected for the initial dilation. Buddy wire remained in place for pushing the balloon across the lesion. Once the balloon was position at the lesion. Inflation to a full 8 atmospheric and nominal diameter was accomplished. A second balloon angioplasty was performed. Balloon was removed from the micro wire, and the roadrunner wire was removed. Angiogram was performed. Given the appearance of the angioplasty site and poor antegrade flow through the cervical carotid, we elected to place a stent prior to thrombectomy of the MCA. A tapered Abbott Xact 24mm/6mm x 40mm carotid stent was selected and deployed at the lesion. Repeat angiogram demonstrated improved flow with evidence of dissection continuing above the stented segment. Given there was adequate antegrade flow, we elected to proceed with mechanical thrombectomy. In intermediate CAT 6 catheter was then passed over the Transcend wire to the skullbase, with copious backflow performed. The hub of the rotating hemostatic valve was attached to pressurized heparinized saline back. Pro view 18 catheter was then selected and advanced over the exchange wire to the skullbase. The exchange wire was then removed and synchro soft micro wire was selected to navigate into the intracranial ICA. Angiogram through the intermediate catheter was performed at the skullbase, and the microcatheter and micro wire were advanced across the proximal M1 occlusion under  roadmap angiogram. While the microcatheter was being advanced through the occlusion over the wire, the intermediate catheter was observed to travel retrograde in the cervical carotid. At this point, we recognized that the neuro on max catheter had withdrawn into the aortic arch, and the entire system needed to be reduced in order to remove the redundancy of the telescoping system. Once the redundancy was removed, NeuronMax was positioned again in the distal common carotid artery, intermediate catheter at the skullbase, and the microcatheter beyond the M1 occlusion, the synchro soft was removed and small injection through the microcatheter confirmed intraluminal location within insular branch location. A rotating hemostatic valve was then attached to the back end of the microcatheter, and a pressurized and heparinized saline bag was attached to the catheter. 4 x 40 solitaire device was then selected. Back flush was achieved at the rotating hemostatic valve, and then the device was gently advanced through the microcatheter to the distal end. The retriever was then unsheathed by withdrawing the microcatheter under fluoroscopy. Once the retriever was completely unsheathed, control angiogram was performed from the intermediate catheter. 3 minute time clock was observed. Constant aspiration was then performed at the tip of the intermediate catheter as the retriever was gently and slowly withdrawn with fluoroscopic observation. Once the retriever was entirely removed from the system, free aspiration was confirmed at  the hub of the intermediate catheter, with free blood return confirmed. Control angiogram was then performed.  TICI 2b Second pass was then attempted. Microcatheter in the micro wire were then advanced through the intermediate catheter, navigated into the insular branches of the right MCA territory under roadmap angiogram. Once the microcatheter was passed distally into the selected branch, the wire was removed  and aspiration at the hub of the macro catheter was performed to confirm return of blood. Gentle contrast injection confirmed intraluminal location within the selected insular branch. The same 4 x 40 solitaire device was then used. Back flush was achieved at the rotating hemostatic valve on the microcatheter, and then the device was gently advanced through the microcatheter to the distal end. The retriever was then unsheathed by withdrawing the microcatheter under fluoroscopy. Once the retriever was completely unsheathed, control angiogram was performed from the intermediate catheter. 3 minute time clock was again observed. Constant aspiration was then performed at the tip of the intermediate catheter as the retriever was gently and slowly withdrawn with fluoroscopic observation. Once the retriever was entirely removed from the system, free aspiration was confirmed at the hub of the intermediate catheter, with free blood return confirmed. Control angiogram was performed with TICI 3 flow. Control angiogram from the NeuronMax within the common carotid artery was performed, demonstrating occlusion at the stent site. 035 Rosen wire was advanced through the intermediate catheter to the skullbase, a rotating hemostatic valve was placed on the back end of the intermediate catheter, an the intermediate catheter was withdrawn slowly under fluoroscopic observation with gentle contrast injection to identified the distal site of the thrombus in the cervical segment of the internal carotid artery. This site was just beyond the stent at the bifurcation. Intermediate catheter was then advanced to the skullbase over the [employer], Rosen wire was removed. An the exchange length Transcend wire was placed into the internal carotid artery. Intermediate catheter was withdrawn. A non tapered Acculink carotid stent 6 mm x 30mm was selected to match the distal 6 mm diameter of the previously placed stent. This stent was deployed under  fluoroscopic road map, with slight distal migration, with the proximal stent margin just beyond the distal stent margin of the first stent. Delivery device was removed an a control angiogram was performed demonstrating hemodynamically significant stenosis at the short interval between the stents. A third stent was then deployed at the interval, with an Abbott Xact, 7mm x 20mm stent selected. Delivery device was removed. Control angiogram was performed at the cervical region confirming no flow limiting stenosis with excellent flow through the stent system. Control angiogram was performed of the intracranial vasculature demonstrating resolution of the flow limiting stenosis at the stent system with continued full reperfusion of the right hemisphere. NeuronMax was then removed. Diagnostic angiogram of the left carotid system was then performed using JB 1 catheter. All catheters wires were removed an and the 8 [place] by 55 cm bright tip sheath was exchanged for a short 8 [place] sheath. Control angiogram was performed at the right common femoral artery access site. Exoseal was deployed for hemostasis. Patient remained hemodynamically stable throughout. No complications were encountered and no significant blood loss encountered. FINDINGS: Ultrasound survey of the right inguinal region demonstrates widely patent common femoral artery with no significant plaque. Right common carotid artery: Initial angiogram of the right common carotid artery demonstrates normal course caliber and contour with no significant atherosclerotic changes. Right external carotid artery: Patent with antegrade flow. Right internal  carotid artery: Occlusion of the right internal carotid artery just beyond the bifurcation with no string sign. Angiogram after balloon angioplasty of the carotid occlusion demonstrates hemodynamically significant stenosis with irregular lumen and spiral Ing filling defect extending from the proximal ICA nearly to the  skullbase. There is associated luminal thrombus in the distal cervical segment. After the initial balloon angioplasty, there is tubular tubular and spiraling filling defect of the cavernous segment extending through the supraclinoid segment to the carotid terminus. After treatment of the tandem occlusion and placement of stent system at the bifurcation extending into the right cervical ICA, there is adequate treatment of presumed dissection at the occlusion, as well as the associated luminal thrombus, with no hemodynamically significant stenosis and excellent flow through the stent system. After mechanical thrombectomy, the distal cervical ICA and the intracranial ICA to the carotid terminus demonstrate resolution of filling defect, which was presumed thrombus. Right MCA: Initial angiogram demonstrates proximal M1 occlusion with no flow. The distal MCA branches cross fill through anterior cerebral circulation and leptomeningeal collaterals. After the first pass with solitaire 4 x 40 and local aspiration there is TICI 2b flow established. Residual filling defect at the distal M1 extending into M2 branches, as well as residual filling defect in the carotid siphon. After the second pass with solitaire 4 x 40 and local aspiration there is complete restoration of flow within the right MCA compatible with TICI 3 flow. No residual filling defect in the carotid siphon. Before the stent system was completed at the carotid bifurcation, the intracranial injection demonstrated no flow within the right A1 segment, which was likely related to competing cross perfusion from the left. After stent system was completed at the carotid bifurcation, intracranial control angiogram demonstrates antegrade flow through the carotid siphon, right MCA, right ACA, with maintained TICI 3 flow. Right ACA: Initial injection demonstrates patency of the right A1 segment, with perfusion of the right ACA territory. There is cross filling through  patent anterior communicating artery, with some perfusion of the left ACA territory. Before the completion of the right carotid stent system, hemodynamically significant stenosis precluded adequate filling of the A1 segment secondary to competing cross flow from the left. After completion of the stent system, this flow phenomenon resolved, with complete filling of the right A1 and A2 branches. Left common carotid artery:  Normal course caliber and contour. Left ICA: Normal course caliber and contour with no significant atherosclerotic changes. Patent 81 segment and anterior communicating artery with cross flow to the right. No filling defect or intracranial stenosis. Left external carotid artery: Unremarkable course caliber and contour and remains patent. Right common femoral artery: Adequate puncture site of the common femoral artery. 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 Deployment of Exoseal for hemostasis. Signed, Yvone Neu. Loreta Ave DO [facility] Radiology PLAN: Patient is stable to CT department for head CT Admit to neuro ICU. First 24 hours target systolic blood pressure of 120-140. Right hip straight for 6 hours status post removal of 8 French sheath. Electronically Signed   By: Gilmer Mor D.O.   On: 04/01/2017 11:23   Ct Cerebral Perfusion W Contrast  Result Date: 03/31/2017 CLINICAL DATA:  76 year old male with left side weakness and evidence of  right MCA emergent large vessel occlusion on noncontrast head CT. EXAM: CT ANGIOGRAPHY HEAD AND NECK CT PERFUSION BRAIN TECHNIQUE: Multidetector CT imaging of the head and neck was performed using the standard protocol during bolus administration of  intravenous contrast. Multiplanar CT image reconstructions and MIPs were obtained to evaluate the vascular anatomy. Carotid stenosis measurements (when applicable) are obtained utilizing NASCET criteria, using the distal internal carotid diameter as the denominator. Multiphase CT imaging of the brain was performed following IV bolus contrast injection. Subsequent parametric perfusion maps were calculated using RAPID software. CONTRAST:  90 mL Isovue 370 COMPARISON:  Noncontrast head CT 1048 hours today. FINDINGS: CT Brain Perfusion Findings: CBF (<30%) Volume: 40 mL (using CBF less than 30%). Perfusion (Tmax>6.0s) volume: 185 mL (T-max greater than 6 seconds). Mismatch Volume: 146 mL (mismatch  ratio 4.6). Infarction Location:Right MCA Perfusion findings were reviewed in person with Dr. Ritta Slot at 1059 hours. CTA NECK Skeleton: No acute osseous abnormality identified. Age concordant degenerative changes in the cervical spine. Intermittent carious dentition. Upper chest: Negative; no superior mediastinal lymphadenopathy. There is calcified coronary artery atherosclerosis. Other neck: Negative thyroid, larynx, pharynx, parapharyngeal spaces, retropharyngeal space, sublingual space, submandibular glands and parotid glands. No cervical lymphadenopathy. Aortic arch: Moderate soft and atherosclerotic arch atherosclerosis. Three vessel arch configuration. Right carotid system: No brachiocephalic artery or right CCA origin stenosis. The right carotid bifurcation is patent, but the right ICA origin is occluded abruptly 9 mm beyond its origin (series 10, image 39). The right ICA remains occluded to the skull base. Left carotid system: No left CCA origin stenosis. Medial soft plaque throughout the left CCA at the level of the thyroid, not hemodynamically significant. Soft and calcified plaque at the left ICA origin and bulb, not hemodynamically significant. Mid and distal cervical left ICA air negative. Vertebral  arteries:No proximal right subclavian artery stenosis. Normal right vertebral artery origin. Negative cervical right vertebral artery. No proximal left subclavian artery stenosis despite soft plaque. Soft plaque at the left vertebral artery origin and V1 segment with only mild stenosis. Otherwise negative cervical left vertebral artery. CTA HEAD Posterior circulation: Codominant distal vertebral arteries without stenosis. Patent PICA origins. Patent vertebrobasilar junction. No basilar stenosis. Normal SCA and PCA origins. Posterior communicating arteries are diminutive or absent. Mild bilateral P1 and P2 segment irregularity, no PCA stenosis identified. Anterior circulation: Left ICA siphon is patent with mild calcified plaque and no stenosis. Left ICA terminus, left MCA and left ACA origins are normal. Right ICA siphon is occluded through the cavernous segment. Beginning at the anterior genu there is reconstitution of the right ICA siphon, and the right ICA terminus is patent. The right MCA origin is patent but the mid M1 segment is occluded about 11 mm from the origin. There is minimal reconstituted enhancement in the proximal M2 segments. The right ACA origin is patent but diminutive and appears somewhat stenotic (series 8, image 89). The left A1 segment may be dominant. A patent anterior communicating artery is identified. The bilateral ACA A2 segments and distal ACA branches appear symmetric and within normal limits. Venous sinuses: Patent. Anatomic variants: Possibly dominant left ACA A1 segment. Review of the MIP images confirms the above findings IMPRESSION: 1. Positive for emergent large vessel occlusion, and favorable CT perfusion findings for endovascular re-perfusion. 2. Occlusion of the Right ICA just beyond its origin. Reconstitution of the Right ICA terminus beginning at the anterior genu (perhaps via the right ophthalmic artery). But superimposed Right MCA mid M1 segment occlusion.  3. CT perfusion  indicates a core infarct of 40 mL in the Right MCA territory and right hemisphere penumbra of 146 mL. 4. The above was relayed to Dr. Ritta Slot beginning at 1059 hours. 5. Stenosis of the right ACA origin, but the right A1 segment may be non dominant. Right A2 and distal ACA enhancement is within normal limits. Normal left MCA and left ACA. 6. Aortic arch and bilateral carotid atherosclerosis without additional stenosis. 7. Mild left vertebral artery origin stenosis due to soft plaque. Otherwise negative posterior circulation. 8. Calcified coronary artery atherosclerosis. Electronically Signed   By: Odessa Fleming M.D.   On: 03/31/2017 11:22   Dg Chest Port 1 View  Result Date: 04/02/2017 CLINICAL DATA:  Acute cerebral infarction.  LEFT-sided weakness. EXAM: PORTABLE CHEST 1 VIEW COMPARISON:  04/01/2017. FINDINGS: Support tubes and lines stable. Bibasilar atelectasis, worse on the LEFT. Increasing small LEFT effusion. No pneumothorax. IMPRESSION: Slight worsening aeration. Electronically Signed   By: Elsie Stain M.D.   On: 04/02/2017 07:29   Dg Chest Port 1 View  Result Date: 04/01/2017 CLINICAL DATA:  Continued surveillance.  Stroke. EXAM: PORTABLE CHEST 1 VIEW COMPARISON:  03/31/2017. FINDINGS: Normal cardiomediastinal silhouette. ET tube good position. Early bibasilar atelectasis. No consolidation or frank edema. No pneumothorax. Enteric tube. IMPRESSION: Early bibasilar atelectasis similar to priors.  ETT stable. Electronically Signed   By: Elsie Stain M.D.   On: 04/01/2017 08:11   Dg Chest Port 1 View  Result Date: 03/31/2017 CLINICAL DATA:  Acute respiratory failure EXAM: PORTABLE CHEST 1 VIEW COMPARISON:  06/02/2013 FINDINGS: Endotracheal tube tip just below the clavicular heads. An orogastric tube reaches the stomach, with side port near the GE junction. Low volume chest with mild atelectasis. No edema, effusion, or pneumothorax. EKG leads create artifact over the chest. IMPRESSION: 1.  Unremarkable positioning of endotracheal and orogastric tubes. 2. Low volumes with mild atelectasis. Electronically Signed   By: Marnee Spring M.D.   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 EXAM: ULTRASOUND GUIDED ACCESS RIGHT COMMON FEMORAL ARTERY. CEREBRAL ANGIOGRAM. REVASCULARIZATION OF OCCLUDED RIGHT INTERNAL CAROTID ARTERY AT THE BIFURCATION WITH BALLOON ANGIOPLASTY AND EMERGENT STENTING MECHANICAL THROMBECTOMY RIGHT MCA OCCLUSION DEPLOYMENT OF CLOSURE DEVICE FOR HEMOSTASIS COMPARISON:  CT 03/31/2017, CT angiogram 03/31/2017 MEDICATIONS: 2.0 g Ancef. The antibiotic was administered within 1 hour of the procedure ANESTHESIA/SEDATION: General anesthesia CONTRAST:  180 cc Isovue FLUOROSCOPY TIME:  Fluoroscopy Time: 44 minutes 18 seconds (3,731 mGy). COMPLICATIONS: None TECHNIQUE: Emergent informed written consent was obtained after discussion with the patient who confirmed he has no family available and no friends to speak on his behalf. Angiogram and thrombectomy was performed as the standard of care for a patient presentation such as his, after a discussion with the patient regarding risks and benefits. He did acknowledge wishing to proceed to be helped. Specific risks discussed include: Bleeding, infection, contrast reaction, kidney injury/failure, need for further procedure/surgery, arterial injury or dissection, embolization to new territory, intracranial hemorrhage (10-15% risk), neurologic deterioration, cardiopulmonary collapse, death. Maximal Sterile Barrier Technique was utilized including caps, mask, sterile gowns, sterile gloves, sterile drape, hand hygiene and skin antiseptic. A timeout was performed  prior to the initiation of the procedure. PROCEDURE: Maximal Sterile Barrier Technique was utilized  including during the procedure including caps, mask, sterile gowns, sterile gloves, sterile drape, hand hygiene and skin antiseptic. A timeout was performed prior to the initiation of the procedure. Ultrasound survey of the right inguinal region was performed with images stored and sent to PACs. A micropuncture needle was used access the right common femoral artery under ultrasound. With excellent arterial blood flow returned, an .018 micro wire was passed through the needle, observed to enter the abdominal aorta under fluoroscopy. The needle was removed, and a micropuncture sheath was placed over the wire. The inner dilator and wire were removed, and an 035 Bentson wire was advanced under fluoroscopy into the abdominal aorta. The sheath was removed and a standard 5 [place] vascular sheath was placed. The dilator was removed and the sheath was flushed. A 74F JB-1 diagnostic catheter was advanced over the wire to the proximal descending thoracic aorta. Wire was then removed. Double flush of the catheter was performed. Catheter was then used to select the innominate artery. Angiogram was performed. Catheter was then advanced into the right common carotid artery. Formal angiogram was performed. Exchange length Rosen wire was then passed through the diagnostic catheter to the distal common carotid artery and the diagnostic catheter was removed. The 5 French sheath was removed and exchanged for 8 French 55 centimeter BrightTip sheath. Sheath was flushed and attached to pressurized and heparinized saline bag for constant forward flow. Then a NeuroMax 80cm catheter was then advanced over the wire, positioned into the distal common carotid artery. Copious back flush was performed and the balloon catheter was attached to heparinized and pressurized saline bag for forward flow. . With the tip of the neuro on max position in the  distal common carotid artery, micro wire and microcatheter were selected in attempt to cross the lesion at the bifurcation. A soft tipped Transcend wire and rapid transit microcatheter were selected. After the wire was navigated across the lesion, the microcatheter encountered significant resistance. The tension in the system caused the neuro on max catheter to buckle towards the aortic arch. A tandem wire using a roadrunner 035 wire was then placed adjacent to the microcatheter system with the tip of the roadrunner into the external carotid artery. With the buddy wire in place, the microcatheter was pushed across the lesion in the cervical carotid. After successfully crossing the lesion at the bifurcation, the wire and microcatheter were position in the distal cervical segment of the ICA. The wire was removed and contrast was infused to confirm intraluminal location. Exchange length Transcend wire was then placed through the microcatheter terminating at the skullbase. The microcatheter was removed, and a 4 mm by 30 mm rapid exchange system Viatrak angioplasty balloon was selected for the initial dilation. Buddy wire remained in place for pushing the balloon across the lesion. Once the balloon was position at the lesion. Inflation to a full 8 atmospheric and nominal diameter was accomplished. A second balloon angioplasty was performed. Balloon was removed from the micro wire, and the roadrunner wire was removed. Angiogram was performed. Given the appearance of the angioplasty site and poor antegrade flow through the cervical carotid, we elected to place a stent prior to thrombectomy of the MCA. A tapered Abbott Xact 102mm/6mm x 40mm carotid stent was selected and deployed at the lesion. Repeat angiogram demonstrated improved flow with evidence of dissection continuing above the stented segment. Given there was adequate antegrade flow, we elected to proceed with mechanical thrombectomy. In intermediate CAT 6 catheter  was then passed  over the Transcend wire to the skullbase, with copious backflow performed. The hub of the rotating hemostatic valve was attached to pressurized heparinized saline back. Pro view 18 catheter was then selected and advanced over the exchange wire to the skullbase. The exchange wire was then removed and synchro soft micro wire was selected to navigate into the intracranial ICA. Angiogram through the intermediate catheter was performed at the skullbase, and the microcatheter and micro wire were advanced across the proximal M1 occlusion under roadmap angiogram. While the microcatheter was being advanced through the occlusion over the wire, the intermediate catheter was observed to travel retrograde in the cervical carotid. At this point, we recognized that the neuro on max catheter had withdrawn into the aortic arch, and the entire system needed to be reduced in order to remove the redundancy of the telescoping system. Once the redundancy was removed, NeuronMax was positioned again in the distal common carotid artery, intermediate catheter at the skullbase, and the microcatheter beyond the M1 occlusion, the synchro soft was removed and small injection through the microcatheter confirmed intraluminal location within insular branch location. A rotating hemostatic valve was then attached to the back end of the microcatheter, and a pressurized and heparinized saline bag was attached to the catheter. 4 x 40 solitaire device was then selected. Back flush was achieved at the rotating hemostatic valve, and then the device was gently advanced through the microcatheter to the distal end. The retriever was then unsheathed by withdrawing the microcatheter under fluoroscopy. Once the retriever was completely unsheathed, control angiogram was performed from the intermediate catheter. 3 minute time clock was observed. Constant aspiration was then performed at the tip of the intermediate catheter as the retriever was  gently and slowly withdrawn with fluoroscopic observation. Once the retriever was entirely removed from the system, free aspiration was confirmed at the hub of the intermediate catheter, with free blood return confirmed. Control angiogram was then performed.  TICI 2b Second pass was then attempted. Microcatheter in the micro wire were then advanced through the intermediate catheter, navigated into the insular branches of the right MCA territory under roadmap angiogram. Once the microcatheter was passed distally into the selected branch, the wire was removed and aspiration at the hub of the macro catheter was performed to confirm return of blood. Gentle contrast injection confirmed intraluminal location within the selected insular branch. The same 4 x 40 solitaire device was then used. Back flush was achieved at the rotating hemostatic valve on the microcatheter, and then the device was gently advanced through the microcatheter to the distal end. The retriever was then unsheathed by withdrawing the microcatheter under fluoroscopy. Once the retriever was completely unsheathed, control angiogram was performed from the intermediate catheter. 3 minute time clock was again observed. Constant aspiration was then performed at the tip of the intermediate catheter as the retriever was gently and slowly withdrawn with fluoroscopic observation. Once the retriever was entirely removed from the system, free aspiration was confirmed at the hub of the intermediate catheter, with free blood return confirmed. Control angiogram was performed with TICI 3 flow. Control angiogram from the NeuronMax within the common carotid artery was performed, demonstrating occlusion at the stent site. 035 Rosen wire was advanced through the intermediate catheter to the skullbase, a rotating hemostatic valve was placed on the back end of the intermediate catheter, an the intermediate catheter was withdrawn slowly under fluoroscopic observation with  gentle contrast injection to identified the distal site of the thrombus in the cervical  segment of the internal carotid artery. This site was just beyond the stent at the bifurcation. Intermediate catheter was then advanced to the skullbase over the [employer], Rosen wire was removed. An the exchange length Transcend wire was placed into the internal carotid artery. Intermediate catheter was withdrawn. A non tapered Acculink carotid stent 6 mm x 30mm was selected to match the distal 6 mm diameter of the previously placed stent. This stent was deployed under fluoroscopic road map, with slight distal migration, with the proximal stent margin just beyond the distal stent margin of the first stent. Delivery device was removed an a control angiogram was performed demonstrating hemodynamically significant stenosis at the short interval between the stents. A third stent was then deployed at the interval, with an Abbott Xact, 7mm x 20mm stent selected. Delivery device was removed. Control angiogram was performed at the cervical region confirming no flow limiting stenosis with excellent flow through the stent system. Control angiogram was performed of the intracranial vasculature demonstrating resolution of the flow limiting stenosis at the stent system with continued full reperfusion of the right hemisphere. NeuronMax was then removed. Diagnostic angiogram of the left carotid system was then performed using JB 1 catheter. All catheters wires were removed an and the 8 [place] by 55 cm bright tip sheath was exchanged for a short 8 [place] sheath. Control angiogram was performed at the right common femoral artery access site. Exoseal was deployed for hemostasis. Patient remained hemodynamically stable throughout. No complications were encountered and no significant blood loss encountered. FINDINGS: Ultrasound survey of the right inguinal region demonstrates widely patent common femoral artery with no significant plaque. Right  common carotid artery: Initial angiogram of the right common carotid artery demonstrates normal course caliber and contour with no significant atherosclerotic changes. Right external carotid artery: Patent with antegrade flow. Right internal carotid artery: Occlusion of the right internal carotid artery just beyond the bifurcation with no string sign. Angiogram after balloon angioplasty of the carotid occlusion demonstrates hemodynamically significant stenosis with irregular lumen and spiral Ing filling defect extending from the proximal ICA nearly to the skullbase. There is associated luminal thrombus in the distal cervical segment. After the initial balloon angioplasty, there is tubular tubular and spiraling filling defect of the cavernous segment extending through the supraclinoid segment to the carotid terminus. After treatment of the tandem occlusion and placement of stent system at the bifurcation extending into the right cervical ICA, there is adequate treatment of presumed dissection at the occlusion, as well as the associated luminal thrombus, with no hemodynamically significant stenosis and excellent flow through the stent system. After mechanical thrombectomy, the distal cervical ICA and the intracranial ICA to the carotid terminus demonstrate resolution of filling defect, which was presumed thrombus. Right MCA: Initial angiogram demonstrates proximal M1 occlusion with no flow. The distal MCA branches cross fill through anterior cerebral circulation and leptomeningeal collaterals. After the first pass with solitaire 4 x 40 and local aspiration there is TICI 2b flow established. Residual filling defect at the distal M1 extending into M2 branches, as well as residual filling defect in the carotid siphon. After the second pass with solitaire 4 x 40 and local aspiration there is complete restoration of flow within the right MCA compatible with TICI 3 flow. No residual filling defect in the carotid siphon.  Before the stent system was completed at the carotid bifurcation, the intracranial injection demonstrated no flow within the right A1 segment, which was likely related to competing cross perfusion from  the left. After stent system was completed at the carotid bifurcation, intracranial control angiogram demonstrates antegrade flow through the carotid siphon, right MCA, right ACA, with maintained TICI 3 flow. Right ACA: Initial injection demonstrates patency of the right A1 segment, with perfusion of the right ACA territory. There is cross filling through patent anterior communicating artery, with some perfusion of the left ACA territory. Before the completion of the right carotid stent system, hemodynamically significant stenosis precluded adequate filling of the A1 segment secondary to competing cross flow from the left. After completion of the stent system, this flow phenomenon resolved, with complete filling of the right A1 and A2 branches. Left common carotid artery:  Normal course caliber and contour. Left ICA: Normal course caliber and contour with no significant atherosclerotic changes. Patent 81 segment and anterior communicating artery with cross flow to the right. No filling defect or intracranial stenosis. Left external carotid artery: Unremarkable course caliber and contour and remains patent. Right common femoral artery: Adequate puncture site of the common femoral artery. 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 Deployment of Exoseal for hemostasis. Signed, Yvone Neu. Loreta Ave DO [facility] Radiology PLAN: Patient is stable to CT department for head CT Admit to neuro ICU. First 24 hours target  systolic blood pressure of 120-140. Right hip straight for 6 hours status post removal of 8 French sheath. Electronically Signed   By: Gilmer Mor D.O.   On: 04/01/2017 11:23   Ct Head Code Stroke W/o Cm  Result Date: 03/31/2017 CLINICAL DATA:  Code stroke. 76 year old male with left side weakness. EXAM: CT HEAD WITHOUT CONTRAST TECHNIQUE: Contiguous axial images were obtained from the base of the skull through the vertex without intravenous contrast. COMPARISON:  Head CT without contrast 12/13/2009 FINDINGS: Brain: Hypodensity at the level of the right insula and external capsule (series 3, image 17). No acute intracranial hemorrhage identified. No midline shift, mass effect, or evidence of intracranial mass lesion. Elsewhere gray-white matter differentiation appears stable since 2011. No ventriculomegaly. Vascular: Hyperdense distal right MCA M1 segment. Skull: No acute osseous abnormality identified. Sinuses/Orbits: Chronic paranasal sinus mucosal thickening, mildly progressed. Tympanic cavities and mastoids are clear. Other: No acute orbit or scalp soft tissue findings. ASPECTS [facility] Stroke Program Early CT Score) - Ganglionic level infarction (caudate, lentiform nuclei, internal capsule, insula, M1-M3 cortex): 6 (minus 1 for right insula) - Supraganglionic infarction (M4-M6 cortex): 3 Total score (0-10 with 10 being normal): 9 IMPRESSION: 1. Emergent right MCA large vessel occlusion suspected. No associated hemorrhage or mass effect. 2. ASPECTS is 9. 3. The above was relayed via text pager to Dr. Ritta Slot 1052 hours. Electronically Signed   By: Odessa Fleming M.D.   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  EXAM: ULTRASOUND GUIDED ACCESS RIGHT COMMON FEMORAL ARTERY. CEREBRAL ANGIOGRAM. REVASCULARIZATION OF OCCLUDED RIGHT INTERNAL CAROTID ARTERY AT THE BIFURCATION WITH BALLOON ANGIOPLASTY AND EMERGENT STENTING MECHANICAL THROMBECTOMY RIGHT MCA OCCLUSION DEPLOYMENT OF CLOSURE DEVICE FOR HEMOSTASIS  COMPARISON:  CT 03/31/2017, CT angiogram 03/31/2017 MEDICATIONS: 2.0 g Ancef. The antibiotic was administered within 1 hour of the procedure ANESTHESIA/SEDATION: General anesthesia CONTRAST:  180 cc Isovue FLUOROSCOPY TIME:  Fluoroscopy Time: 44 minutes 18 seconds (3,731 mGy). COMPLICATIONS: None TECHNIQUE: Emergent informed written consent was obtained after discussion with the patient who confirmed he has no family available and no friends to speak on his behalf. Angiogram and thrombectomy was performed as the standard of care for a patient presentation such as his, after a discussion with the patient regarding risks and benefits. He did acknowledge wishing to proceed to be helped. Specific risks discussed include: Bleeding, infection, contrast reaction, kidney injury/failure, need for further procedure/surgery, arterial injury or dissection, embolization to new territory, intracranial hemorrhage (10-15% risk), neurologic deterioration, cardiopulmonary collapse, death. Maximal Sterile Barrier Technique was utilized including caps, mask, sterile gowns, sterile gloves, sterile drape, hand hygiene and skin antiseptic. A timeout was performed prior to the initiation of the procedure. PROCEDURE: Maximal Sterile Barrier Technique was utilized including during the procedure including caps, mask, sterile gowns, sterile gloves, sterile drape, hand hygiene and skin antiseptic. A timeout was performed prior to the initiation of the procedure. Ultrasound survey of the right inguinal region was performed with images stored and sent to PACs. A micropuncture needle was used access the right common femoral artery under ultrasound. With  excellent arterial blood flow returned, an .018 micro wire was passed through the needle, observed to enter the abdominal aorta under fluoroscopy. The needle was removed, and a micropuncture sheath was placed over the wire. The inner dilator and wire were removed, and an 035 B[place]on wire was advanced under fluoroscopy into the abdominal aorta. The sheath was removed and a standard 5 [place] vascular sheath was placed. The dilator was removed and the sheath was flushed. A 68F JB-1 diagnostic catheter was advanced over the wire to the proximal descending thoracic aorta. Wire was then removed. Double flush of the catheter was performed. Catheter was then used to select the innominate artery. Angiogram was performed. Catheter was then advanced into the right common carotid artery. Formal angiogram was performed. Exchange length Rosen wire was then passed through the diagnostic catheter to the distal common carotid artery and the diagnostic catheter was removed. The 5 French sheath was removed and exchanged for 8 French 55 centimeter BrightTip sheath. Sheath was flushed and attached to pressurized and heparinized saline bag for constant forward flow. Then a NeuroMax 80cm catheter was then advanced over the wire, positioned into the distal common carotid artery. Copious back flush was performed and the balloon catheter was attached to heparinized and pressurized saline bag for forward flow. . With the tip of the neuro on max position in the distal common carotid artery, micro wire and microcatheter were selected in attempt to cross the lesion at the bifurcation. A soft tipped Transcend wire and rapid transit microcatheter were selected. After the wire was navigated across the lesion, the microcatheter encountered significant resistance. The tension in the system caused the neuro on max catheter to buckle towards the aortic arch. A tandem wire using a roadrunner 035 wire was then placed adjacent to the microcatheter system  with the tip of the roadrunner into the external carotid artery. With the buddy wire in place, the microcatheter was pushed across the lesion in the cervical carotid. After successfully crossing the lesion at the bifurcation, the wire and microcatheter were position in the distal cervical segment of the ICA. The wire was removed and contrast  was infused to confirm intraluminal location. Exchange length Transcend wire was then placed through the microcatheter terminating at the skullbase. The microcatheter was removed, and a 4 mm by 30 mm rapid exchange system Viatrak angioplasty balloon was selected for the initial dilation. Buddy wire remained in place for pushing the balloon across the lesion. Once the balloon was position at the lesion. Inflation to a full 8 atmospheric and nominal diameter was accomplished. A second balloon angioplasty was performed. Balloon was removed from the micro wire, and the roadrunner wire was removed. Angiogram was performed. Given the appearance of the angioplasty site and poor antegrade flow through the cervical carotid, we elected to place a stent prior to thrombectomy of the MCA. A tapered Abbott Xact 67mm/6mm x 40mm carotid stent was selected and deployed at the lesion. Repeat angiogram demonstrated improved flow with evidence of dissection continuing above the stented segment. Given there was adequate antegrade flow, we elected to proceed with mechanical thrombectomy. In intermediate CAT 6 catheter was then passed over the Transcend wire to the skullbase, with copious backflow performed. The hub of the rotating hemostatic valve was attached to pressurized heparinized saline back. Pro view 18 catheter was then selected and advanced over the exchange wire to the skullbase. The exchange wire was then removed and synchro soft micro wire was selected to navigate into the intracranial ICA. Angiogram through the intermediate catheter was performed at the skullbase, and the microcatheter  and micro wire were advanced across the proximal M1 occlusion under roadmap angiogram. While the microcatheter was being advanced through the occlusion over the wire, the intermediate catheter was observed to travel retrograde in the cervical carotid. At this point, we recognized that the neuro on max catheter had withdrawn into the aortic arch, and the entire system needed to be reduced in order to remove the redundancy of the telescoping system. Once the redundancy was removed, NeuronMax was positioned again in the distal common carotid artery, intermediate catheter at the skullbase, and the microcatheter beyond the M1 occlusion, the synchro soft was removed and small injection through the microcatheter confirmed intraluminal location within insular branch location. A rotating hemostatic valve was then attached to the back end of the microcatheter, and a pressurized and heparinized saline bag was attached to the catheter. 4 x 40 solitaire device was then selected. Back flush was achieved at the rotating hemostatic valve, and then the device was gently advanced through the microcatheter to the distal end. The retriever was then unsheathed by withdrawing the microcatheter under fluoroscopy. Once the retriever was completely unsheathed, control angiogram was performed from the intermediate catheter. 3 minute time clock was observed. Constant aspiration was then performed at the tip of the intermediate catheter as the retriever was gently and slowly withdrawn with fluoroscopic observation. Once the retriever was entirely removed from the system, free aspiration was confirmed at the hub of the intermediate catheter, with free blood return confirmed. Control angiogram was then performed.  TICI 2b Second pass was then attempted. Microcatheter in the micro wire were then advanced through the intermediate catheter, navigated into the insular branches of the right MCA territory under roadmap angiogram. Once the  microcatheter was passed distally into the selected branch, the wire was removed and aspiration at the hub of the macro catheter was performed to confirm return of blood. Gentle contrast injection confirmed intraluminal location within the selected insular branch. The same 4 x 40 solitaire device was then used. Back flush was achieved at the rotating hemostatic valve  on the microcatheter, and then the device was gently advanced through the microcatheter to the distal end. The retriever was then unsheathed by withdrawing the microcatheter under fluoroscopy. Once the retriever was completely unsheathed, control angiogram was performed from the intermediate catheter. 3 minute time clock was again observed. Constant aspiration was then performed at the tip of the intermediate catheter as the retriever was gently and slowly withdrawn with fluoroscopic observation. Once the retriever was entirely removed from the system, free aspiration was confirmed at the hub of the intermediate catheter, with free blood return confirmed. Control angiogram was performed with TICI 3 flow. Control angiogram from the NeuronMax within the common carotid artery was performed, demonstrating occlusion at the stent site. 035 Rosen wire was advanced through the intermediate catheter to the skullbase, a rotating hemostatic valve was placed on the back end of the intermediate catheter, an the intermediate catheter was withdrawn slowly under fluoroscopic observation with gentle contrast injection to identified the distal site of the thrombus in the cervical segment of the internal carotid artery. This site was just beyond the stent at the bifurcation. Intermediate catheter was then advanced to the skullbase over the [employer], Rosen wire was removed. An the exchange length Transcend wire was placed into the internal carotid artery. Intermediate catheter was withdrawn. A non tapered Acculink carotid stent 6 mm x 30mm was selected to match the  distal 6 mm diameter of the previously placed stent. This stent was deployed under fluoroscopic road map, with slight distal migration, with the proximal stent margin just beyond the distal stent margin of the first stent. Delivery device was removed an a control angiogram was performed demonstrating hemodynamically significant stenosis at the short interval between the stents. A third stent was then deployed at the interval, with an Abbott Xact, 7mm x 20mm stent selected. Delivery device was removed. Control angiogram was performed at the cervical region confirming no flow limiting stenosis with excellent flow through the stent system. Control angiogram was performed of the intracranial vasculature demonstrating resolution of the flow limiting stenosis at the stent system with continued full reperfusion of the right hemisphere. NeuronMax was then removed. Diagnostic angiogram of the left carotid system was then performed using JB 1 catheter. All catheters wires were removed an and the 8 [place] by 55 cm bright tip sheath was exchanged for a short 8 [place] sheath. Control angiogram was performed at the right common femoral artery access site. Exoseal was deployed for hemostasis. Patient remained hemodynamically stable throughout. No complications were encountered and no significant blood loss encountered. FINDINGS: Ultrasound survey of the right inguinal region demonstrates widely patent common femoral artery with no significant plaque. Right common carotid artery: Initial angiogram of the right common carotid artery demonstrates normal course caliber and contour with no significant atherosclerotic changes. Right external carotid artery: Patent with antegrade flow. Right internal carotid artery: Occlusion of the right internal carotid artery just beyond the bifurcation with no string sign. Angiogram after balloon angioplasty of the carotid occlusion demonstrates hemodynamically significant stenosis with irregular  lumen and spiral Ing filling defect extending from the proximal ICA nearly to the skullbase. There is associated luminal thrombus in the distal cervical segment. After the initial balloon angioplasty, there is tubular tubular and spiraling filling defect of the cavernous segment extending through the supraclinoid segment to the carotid terminus. After treatment of the tandem occlusion and placement of stent system at the bifurcation extending into the right cervical ICA, there is adequate treatment of presumed dissection  at the occlusion, as well as the associated luminal thrombus, with no hemodynamically significant stenosis and excellent flow through the stent system. After mechanical thrombectomy, the distal cervical ICA and the intracranial ICA to the carotid terminus demonstrate resolution of filling defect, which was presumed thrombus. Right MCA: Initial angiogram demonstrates proximal M1 occlusion with no flow. The distal MCA branches cross fill through anterior cerebral circulation and leptomeningeal collaterals. After the first pass with solitaire 4 x 40 and local aspiration there is TICI 2b flow established. Residual filling defect at the distal M1 extending into M2 branches, as well as residual filling defect in the carotid siphon. After the second pass with solitaire 4 x 40 and local aspiration there is complete restoration of flow within the right MCA compatible with TICI 3 flow. No residual filling defect in the carotid siphon. Before the stent system was completed at the carotid bifurcation, the intracranial injection demonstrated no flow within the right A1 segment, which was likely related to competing cross perfusion from the left. After stent system was completed at the carotid bifurcation, intracranial control angiogram demonstrates antegrade flow through the carotid siphon, right MCA, right ACA, with maintained TICI 3 flow. Right ACA: Initial injection demonstrates patency of the right A1  segment, with perfusion of the right ACA territory. There is cross filling through patent anterior communicating artery, with some perfusion of the left ACA territory. Before the completion of the right carotid stent system, hemodynamically significant stenosis precluded adequate filling of the A1 segment secondary to competing cross flow from the left. After completion of the stent system, this flow phenomenon resolved, with complete filling of the right A1 and A2 branches. Left common carotid artery:  Normal course caliber and contour. Left ICA: Normal course caliber and contour with no significant atherosclerotic changes. Patent 81 segment and anterior communicating artery with cross flow to the right. No filling defect or intracranial stenosis. Left external carotid artery: Unremarkable course caliber and contour and remains patent. Right common femoral artery: Adequate puncture site of the common femoral artery. 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 Deployment of Exoseal for hemostasis. Signed, Yvone Neu. Loreta Ave DO [facility] PLAN: Patient is stable to CT department for head CT Admit to neuro ICU. First 24 hours target systolic blood pressure of 120-140. Right hip straight for 6 hours status post removal of 8 French sheath. Electronically Signed   By: Gilmer Mor D.O.   On: 04/01/2017 11:23       ASSESSMENT and PLAN  Acute respiratory failure (HCC) Due to stroke. This AM  Doing SBT and likely wil meet extubation criteria  Plan sbt as tolerated If does well can extubate  Acute encephalopathy Improved agaitation  Plan Continue prn sedation If needed can do diprivan  gtt  Stroke (cerebrum) (HCC) Some evolutino yesterday via CT but MRI with stability  Plan Per neuro service  Electrolyte imbalance Low phos +  Plan repelte phos      FAMILY  - Updates: 04/02/2017 --> none at bedside  - Inter-disciplinary family meet or Palliative Care meeting due by:  DAy 7. Current LOS is LOS 2 days  CODE STATUS    Code Status Orders  Start     Ordered   03/31/17 1553  Full code  Continuous     03/31/17 1554    Code Status History    Date Active Date Inactive Code Status Order ID Comments User Context   03/31/2017  3:32 PM 03/31/2017  3:54 PM Full Code [id number]  Rejeana Brock, MD Inpatient   06/21/2012  9:59 PM 06/23/2012  6:00 PM Full Code [id number]  Valaria Good, RN Inpatient        DISPO Keep in ICU. Aim to extubate 04/02/2017       The patient is critically ill with multiple organ systems failure and requires high complexity decision making for assessment and support, frequent evaluation and titration of therapies, application of advanced monitoring technologies and extensive interpretation of multiple databases.   Critical Care Time devoted to patient care services described in this note is  30  Minutes. This time reflects time of care of this signee Dr Kalman Shan. This critical care time does not reflect procedure time, or teaching time or supervisory time of PA/NP/Med student/Med Resident etc but could involve care discussion time    Dr. Kalman Shan, M.D., [facility].C.P Pulmonary and Critical Care Medicine Staff Physician [facility]  Pulmonary and Critical Care Pager: [phone], If no answer or between  15:00h - 7:00h: call [phone]  04/02/2017 7:45 AM

## 2017-04-02 NOTE — Progress Notes (Signed)
  Echocardiogram 2D Echocardiogram has been performed.  Delcie Roch 04/02/2017, 6:00 PM

## 2017-04-02 NOTE — Progress Notes (Signed)
PT Cancellation Note  Patient Details Name: Matthew Meza MRN: [id number] DOB: [birth year]   Cancelled Treatment:    Reason Eval/Treat Not Completed: Patient not medically ready.  Patient remains intubated on vent.  Will continue to follow.   Vena Austria 04/02/2017, 12:15 PM Durenda Hurt. Renaldo Fiddler, [facility] Acute Rehab Services Pager [phone]

## 2017-04-03 ENCOUNTER — Encounter (HOSPITAL_COMMUNITY): Payer: Self-pay | Admitting: Interventional Radiology

## 2017-04-03 DIAGNOSIS — I69354 Hemiplegia and hemiparesis following cerebral infarction affecting left non-dominant side: Secondary | ICD-10-CM

## 2017-04-03 DIAGNOSIS — D72829 Elevated white blood cell count, unspecified: Secondary | ICD-10-CM | POA: Diagnosis not present

## 2017-04-03 DIAGNOSIS — R7303 Prediabetes: Secondary | ICD-10-CM

## 2017-04-03 DIAGNOSIS — I63231 Cerebral infarction due to unspecified occlusion or stenosis of right carotid arteries: Secondary | ICD-10-CM

## 2017-04-03 DIAGNOSIS — I6300 Cerebral infarction due to thrombosis of unspecified precerebral artery: Secondary | ICD-10-CM

## 2017-04-03 DIAGNOSIS — I69322 Dysarthria following cerebral infarction: Secondary | ICD-10-CM

## 2017-04-03 DIAGNOSIS — R0682 Tachypnea, not elsewhere classified: Secondary | ICD-10-CM

## 2017-04-03 DIAGNOSIS — I69391 Dysphagia following cerebral infarction: Secondary | ICD-10-CM

## 2017-04-03 DIAGNOSIS — N182 Chronic kidney disease, stage 2 (mild): Secondary | ICD-10-CM

## 2017-04-03 LAB — ECHOCARDIOGRAM COMPLETE
CHL CUP DOP CALC LVOT VTI: 16.7 cm
CHL CUP MV DEC (S): 204
CHL CUP RV SYS PRESS: 37 mmHg
CHL CUP TV REG PEAK VELOCITY: 291 cm/s
EERAT: 9.17
EWDT: 204 ms
FS: 32 % (ref 28–44)
HEIGHTINCHES: 66.929 in
IV/PV OW: 0.99
LA diam end sys: 32 mm
LA vol A4C: 54.1 ml
LA vol: 49.4 mL
LADIAMINDEX: 1.6 cm/m2
LASIZE: 32 mm
LAVOLIN: 24.7 mL/m2
LV E/e' medial: 9.17
LV E/e'average: 9.17
LV PW d: 10.7 mm — AB (ref 0.6–1.1)
LV TDI E'LATERAL: 10.1
LV TDI E'MEDIAL: 11.9
LVELAT: 10.1 cm/s
LVOT area: 3.46 cm2
LVOTD: 21 mm
LVOTPV: 87.2 cm/s
LVOTSV: 58 mL
Lateral S' vel: 14.5 cm/s
MV Peak grad: 3 mmHg
MV pk A vel: 52.9 m/s
MVPKEVEL: 92.6 m/s
TAPSE: 26.7 mm
TR max vel: 291 cm/s
WEIGHTICAEL: 3114.66 [oz_av]

## 2017-04-03 LAB — CBC WITH DIFFERENTIAL/PLATELET
Basophils Absolute: 0 10*3/uL (ref 0.0–0.1)
Basophils Relative: 0 %
Eosinophils Absolute: 0 10*3/uL (ref 0.0–0.7)
Eosinophils Relative: 0 %
HCT: 38.8 % — ABNORMAL LOW (ref 39.0–52.0)
Hemoglobin: 13.3 g/dL (ref 13.0–17.0)
Lymphocytes Relative: 8 %
Lymphs Abs: 1.2 10*3/uL (ref 0.7–4.0)
MCH: 31 pg (ref 26.0–34.0)
MCHC: 34.3 g/dL (ref 30.0–36.0)
MCV: 90.4 fL (ref 78.0–100.0)
Monocytes Absolute: 1 10*3/uL (ref 0.1–1.0)
Monocytes Relative: 6 %
Neutro Abs: 13.9 10*3/uL — ABNORMAL HIGH (ref 1.7–7.7)
Neutrophils Relative %: 86 %
Platelets: 120 10*3/uL — ABNORMAL LOW (ref 150–400)
RBC: 4.29 MIL/uL (ref 4.22–5.81)
RDW: 12.7 % (ref 11.5–15.5)
WBC: 16.1 10*3/uL — ABNORMAL HIGH (ref 4.0–10.5)

## 2017-04-03 LAB — CALCIUM, IONIZED: Calcium, Ionized, Serum: 4.6 mg/dL (ref 4.5–5.6)

## 2017-04-03 LAB — PHOSPHORUS: PHOSPHORUS: 2.4 mg/dL — AB (ref 2.5–4.6)

## 2017-04-03 LAB — MAGNESIUM: Magnesium: 1.8 mg/dL (ref 1.7–2.4)

## 2017-04-03 NOTE — Discharge Instructions (Signed)

## 2017-04-03 NOTE — Progress Notes (Signed)
  Speech Language Pathology Treatment: Dysphagia  Patient Details Name: Matthew Meza MRN: [id number] DOB: 1941/03/31 Today's Date: 04/03/2017 Time: 4696-2952 SLP Time Calculation (min) (ACUTE ONLY): 8 min  Assessment / Plan / Recommendation Clinical Impression  Requested by MD to re-evaluation swallowing given improvements in alertness this pm. Patient sleeping upon arrival to room however easily aroused and able to better maintain alert state for po trials (min-no cues). Patient however continues to present with significant s/s of aspiration with po textures warranting instrumental testing. Will plan for MBS in am 5/1.    HPI HPI: [name] a 76 y.o.malewith no significant past medical historypresenting with left hemiparesis.Hedidnot receive IV t-PA due to late presentation. Dx with hemorrhagic right MCA infarct. Status post IR thrombectomy and stenting 4/27, remained intubated post procedure. Extubated 4/29.      SLP Plan  MBS       Recommendations  Diet recommendations: NPO Medication Administration: Via alternative means                Oral Care Recommendations: Oral care QID Follow up Recommendations: Inpatient Rehab SLP Visit Diagnosis: Dysphagia, unspecified (R13.10) Plan: MBS       GO               Ferdinand Lango MA, CCC-SLP [phone]  [name] 04/03/2017, 3:07 PM

## 2017-04-03 NOTE — Progress Notes (Signed)
PULMONARY / CRITICAL CARE MEDICINE   Name: Matthew Meza MRN: [id number] DOB: April 02, 1941    ADMISSION DATE:  03/31/2017   Briefly, pt is Matthew Meza is a 76 year old man hospitalized for right MCA stroke. History was collected from chart review as the patient is intubated. He presented to ED with left sided weakness and garbled speech. He was last seen normal yesterday evening at his living facility. Around 4AM, he got up and went to the bathroom but fell and could not get up. Six hours later, he called 911 and was transported as code stroke. In the ED, CTA head/neck was notable for right ICA occlusion just beyond the origin and right ACA stenosis  He was transported to IR for revascularization. PCCM was consulted for vent management. He was extubated yesterday and has done well off ventilator.  Pt seen, awake, follows commands, NAD.  (+) facial asymetry.  Good ae. Crackles at bases. Good s1/s2.  (+) BS, soft, obese, NT. Trace edema.   Assessment/Plan : Acute R MCA - per neurology - speech evaluation once able  S/P acute hypoxemic respiratory failure secondary to unable to protect airway. - tolerating extubation.  - cont incentive spirometry.   Plan d/w stroke team.  PCCM will sign off. Call back if with issues.    Pollie Meyer, MD Pulmonary and Critical Care Medicine [facility] Pager: [phone] After 3 pm or if no response, call [phone]  04/03/2017, 9:57 AM

## 2017-04-03 NOTE — Progress Notes (Signed)
RN paged general neurology to question need for foley. Awaiting call return. Will pass on to night RN if not call back.

## 2017-04-03 NOTE — Progress Notes (Signed)
Pt admitted to the unit. Pt is oriented per baseline. Oriented to room, staff, and call bell. Educated to call for any assistance. Bed in lowest position, call bell within reach- will continue to monitor.

## 2017-04-03 NOTE — Progress Notes (Signed)
Neurologist returned call- foley d/c

## 2017-04-03 NOTE — Progress Notes (Signed)
Physical medicine rehabilitation consult requested chart reviewed. Await physical and occupational therapy evaluations and discuss discharge planning. Will follow-up with formal rehabilitation consult

## 2017-04-03 NOTE — Evaluation (Addendum)
Physical Therapy Evaluation Patient Details Name: Matthew Meza MRN: [id number] DOB: 07-Apr-1941 Today's Date: 04/03/2017   History of Present Illness  76 yo admitted with Rt MCA CVA, VDRF 4/27-4/29. No significant PMHx   Clinical Impression  Pt awake, aware of CVA and situation preceding EMS arrival. Pt able to sense light touch to left side but unable to move LUE or LLE at this time with decreased balance, safety awareness, transfers, mobility, function, and cardiopulmonary status. Pt will benefit from acute therapy to maximize mobility, function, balance, and cognition to decrease burden of care. Pt eager to move and walk but lacks insight into deficits. Pt with right gaze preference and able to achieve and cross midline although did not attend to left lower quadrant during session. RN aware of need for maximove to return to bed.   BP 126/71 HR 75 97% on 6L    Follow Up Recommendations SNF;Supervision/Assistance - 24 hour    Equipment Recommendations  None recommended by PT    Recommendations for Other Services Speech consult     Precautions / Restrictions Precautions Precautions: Fall Precaution Comments: left hemiplegia, neglect      Mobility  Bed Mobility Overal bed mobility: Needs Assistance Bed Mobility: Rolling;Sidelying to Sit Rolling: Mod assist Sidelying to sit: Mod assist;+2 for physical assistance;+2 for safety/equipment;HOB elevated       General bed mobility comments: cueing and facilitation to roll left with assist grasp rail, bend knee, look to rail and rotate pelvis  Transfers Overall transfer level: Needs assistance   Transfers: Sit to/from Stand;Squat Pivot Transfers Sit to Stand: Mod assist;+2 physical assistance   Squat pivot transfers: Max assist;+2 safety/equipment;+2 physical assistance     General transfer comment: pt stood x 3 from bed with left knee blocked, assist for rise, anterior translation and stabilization in standing with pt  with left lean and flexed trunk. Pivot with +3 assist to physically assist and advance RLE to pivot toward chair with pt maintaining left lean  Ambulation/Gait                Stairs            Wheelchair Mobility    Modified Rankin (Stroke Patients Only)       Balance Overall balance assessment: Needs assistance   Sitting balance-Leahy Scale: Poor Sitting balance - Comments: left lean with cues and assist of RUE on rail to maintain sitting with physical support min - max assist EOB grossly 6 min Postural control: Left lateral lean   Standing balance-Leahy Scale: Zero Standing balance comment: left lean and pt able to shift pelvis right with cueing and assist but unable to fully unweight either leg with assist and weight shift                             Pertinent Vitals/Pain Pain Assessment: No/denies pain    Home Living Family/patient expects to be discharged to:: Private residence Living Arrangements: Alone Available Help at Discharge: Other (Comment) Type of Home: Other(Comment) (motel) Home Access: Level entry     Home Layout: One level        Prior Function Level of Independence: Independent         Comments: pt reports no family or available assist at D/C     Hand Dominance        Extremity/Trunk Assessment   Upper Extremity Assessment Upper Extremity Assessment: Defer to OT evaluation    Lower  Extremity Assessment Lower Extremity Assessment: LLE deficits/detail LLE Deficits / Details: trace 1/5 hip rotation with cueing, pt able to state light touch accurately, no hip flexion, knee flexion or dorsiflexion on command. Trace quad activation in standing    Cervical / Trunk Assessment Cervical / Trunk Assessment: Kyphotic  Communication   Communication: Other (comment) (slurred speech)  Cognition Arousal/Alertness: Awake/alert Behavior During Therapy: WFL for tasks assessed/performed Overall Cognitive Status:  Impaired/Different from baseline Area of Impairment: Safety/judgement;Awareness;Orientation                 Orientation Level: Disoriented to;Time       Safety/Judgement: Decreased awareness of deficits;Decreased awareness of safety     General Comments: pt with left neglect and inattention, stating he can walk and that he walked yesterday      General Comments      Exercises     Assessment/Plan    PT Assessment Patient needs continued PT services  PT Problem List Decreased strength;Decreased mobility;Decreased safety awareness;Decreased activity tolerance;Decreased cognition;Cardiopulmonary status limiting activity;Decreased balance;Decreased knowledge of use of DME;Decreased coordination       PT Treatment Interventions DME instruction;Therapeutic activities;Cognitive remediation;Therapeutic exercise;Patient/family education;Balance training;Functional mobility training;Neuromuscular re-education    PT Goals (Current goals can be found in the Care Plan section)  Acute Rehab PT Goals Patient Stated Goal: walk and put my pants on  PT Goal Formulation: With patient Time For Goal Achievement: 04/17/17 Potential to Achieve Goals: Fair    Frequency Min 4X/week   Barriers to discharge Decreased caregiver support      Co-evaluation PT/OT/SLP Co-Evaluation/Treatment: Yes Reason for Co-Treatment: Necessary to address cognition/behavior during functional activity;Complexity of the patient's impairments (multi-system involvement);For patient/therapist safety PT goals addressed during session: Mobility/safety with mobility;Balance         AM-PAC PT "6 Clicks" Daily Activity  Outcome Measure Difficulty turning over in bed (including adjusting bedclothes, sheets and blankets)?: Total Difficulty moving from lying on back to sitting on the side of the bed? : Total Difficulty sitting down on and standing up from a chair with arms (e.g., wheelchair, bedside commode,  etc,.)?: Total Help needed moving to and from a bed to chair (including a wheelchair)?: Total Help needed walking in hospital room?: Total Help needed climbing 3-5 steps with a railing? : Total 6 Click Score: 6    End of Session Equipment Utilized During Treatment: Gait belt;Oxygen Activity Tolerance: Patient tolerated treatment well Patient left: in chair;with call bell/phone within reach;with chair alarm set Nurse Communication: Mobility status;Need for lift equipment;Precautions PT Visit Diagnosis: Other abnormalities of gait and mobility (R26.89);Unsteadiness on feet (R26.81);Hemiplegia and hemiparesis Hemiplegia - Right/Left: Left Hemiplegia - dominant/non-dominant: Non-dominant Hemiplegia - caused by: Cerebral infarction    Time: 1212-1245 PT Time Calculation (min) (ACUTE ONLY): 33 min   Charges:   PT Evaluation $PT Eval Moderate Complexity: 1 Procedure     PT G Codes:        Delaney Meigs, PT [phone]  Latrel Szymczak B Adalida Garver 04/03/2017, 1:16 PM

## 2017-04-03 NOTE — Anesthesia Postprocedure Evaluation (Addendum)
Anesthesia Post Note  Patient: Matthew Meza  Procedure(s) Performed: Procedure(s) (LRB): RADIOLOGY WITH ANESTHESIA (N/A)  Patient location during evaluation: ICU Anesthesia Type: General Level of consciousness: sedated and patient remains intubated per anesthesia plan Vital Signs Assessment: post-procedure vital signs reviewed and stable Respiratory status: patient remains intubated per anesthesia plan and patient on ventilator - see flowsheet for VS Cardiovascular status: blood pressure returned to baseline Anesthetic complications: no       Last Vitals:  Vitals:   04/03/17 1300 04/03/17 1430  BP: 132/74 127/60  Pulse: 67 60  Resp: (!) 23 20  Temp:  36.2 C    Last Pain:  Vitals:   04/03/17 1430  TempSrc: Axillary                 Mcgwire Dasaro COKER

## 2017-04-03 NOTE — Consult Note (Signed)
Physical Medicine and Rehabilitation Consult Reason for Consult: Right MCA infarct Referring Physician: Dr. Pearlean Brownie  HPI: Matthew Meza is a 76 y.o. right handed male presented 03/31/2017 with left-sided weakness. Per chart review patient lives alone in [place] [facility]. Reported no family can assist on discharge. Cranial CT reviewed showing right CVA. Per report CT/MRI scan showed emergent right MCA large vessel occlusion. CT cerebral perfusion scan as well as CT angiogram head and neck showed occlusion of the right ICA. Patient did require intubation. He underwent revascularization per interventional radiology with stenting. Echocardiogram with ejection fraction of 60% no wall motion abnormalities. Patient did not receive TPA. Remained intubated until 04/02/2017. Presently with Cardene drip for blood pressure control. He is NPO and await speech therapy follow-up for swallow any dysfunction. Currently on aspirin for CVA prophylaxis. Physical therapy evaluation completed. M.D. has requested physical medicine rehabilitation consult  Review of Systems  Unable to perform ROS: Acuity of condition   Past Medical History:  Diagnosis Date  . No pertinent past medical history   on file, unable to obtain from patient. Past Surgical History:  Procedure Laterality Date  . APPENDECTOMY    . IR ANGIO INTRA EXTRACRAN SEL COM CAROTID INNOMINATE UNI L MOD SED  03/31/2017  . IR INTRAVSC STENT CERV CAROTID W/O EMB-PROT MOD SED INC ANGIO  03/31/2017  . IR PERCUTANEOUS ART THROMBECTOMY/INFUSION INTRACRANIAL INC DIAG ANGIO  03/31/2017  . IR US GUIDE VASC ACCESS RIGHT  03/31/2017  . LAPAROSCOPIC APPENDECTOMY  06/21/2012   Procedure: APPENDECTOMY LAPAROSCOPIC;  Surgeon: Kandis Cocking, MD;  Location: [facility];  Service: General;  Laterality: N/A;  . NO PAST SURGERIES    . RADIOLOGY WITH ANESTHESIA N/A 03/31/2017   Procedure: RADIOLOGY WITH ANESTHESIA;  Surgeon: Gilmer Mor,  DO;  Location: [facility] OR;  Service: Anesthesiology;  Laterality: N/A;   Family History  Problem Relation Age of Onset  . Stroke Mother    Social History:  reports that he quit smoking about 21 years ago. He quit after 20.00 years of use. He has never used smokeless tobacco. He reports that he does not drink alcohol or use drugs. Allergies:  Allergies  Allergen Reactions  . No Known Allergies    Medications Prior to Admission  Medication Sig Dispense Refill  . b complex vitamins tablet Take 1 tablet by mouth every other day.    . Cholecalciferol (VITAMIN D) 400 UNITS capsule Take 400 Units by mouth daily.    . Garlic 1000 MG CAPS Take 1 capsule by mouth every other day.    . Lactobacillus Rhamnosus, GG, ([employer] PROBIOTIC, LACTOBACILLUS,) CAPS Use as directed on the bottle. (Patient not taking: Reported on 10/10/2015) 30 capsule 6  . magnesium 30 MG tablet Take 30 mg by mouth every other day.    . Multiple Vitamin (MULTIVITAMIN WITH MINERALS) TABS Take 1 tablet by mouth daily.    . predniSONE (DELTASONE) 10 MG tablet 6-5-4-3-2-1 po pc for sinus (Patient not taking: Reported on 10/10/2015) 21 tablet 0  . psyllium (METAMUCIL SMOOTH TEXTURE) 28 % packet Take 1 packet by mouth 2 (two) times daily. (Patient not taking: Reported on 10/10/2015) 30 packet 6    Home: Home Living Family/patient expects to be discharged to:: Private residence Living Arrangements: Alone Available Help at Discharge: Other (Comment) Type of Home: Other(Comment) (motel) Home Access: Level entry Home Layout: One level Bathroom Shower/Tub: Engineer, manufacturing systems: Standard  Functional History: Prior Function Level  of Independence: Independent Comments: pt reports no family or available assist at D/C Functional Status:  Mobility:          ADL:    Cognition: Cognition Overall Cognitive Status: Impaired/Different from baseline Arousal/Alertness: Lethargic Orientation Level: Oriented to person, Disoriented  to time, Oriented to place, Oriented to situation Attention: Sustained Sustained Attention: Impaired Sustained Attention Impairment: Verbal basic, Functional basic Memory: Impaired Memory Impairment: Storage deficit, Retrieval deficit, Decreased recall of new information Awareness: Impaired Awareness Impairment: Intellectual impairment Problem Solving: Impaired Problem Solving Impairment: Verbal complex, Functional basic Safety/Judgment: Impaired Comments: left sided inattention vs visual field cut Cognition Arousal/Alertness: Awake/alert Behavior During Therapy: WFL for tasks assessed/performed Overall Cognitive Status: Impaired/Different from baseline Area of Impairment: Safety/judgement, Awareness, Orientation Orientation Level: Disoriented to, Time Safety/Judgement: Decreased awareness of deficits, Decreased awareness of safety General Comments: pt with left neglect and inattention, stating he can walk and that he walked yesterday  Blood pressure 126/71, pulse 67, temperature 98.1 F (36.7 C), temperature source Oral, resp. rate (!) 21, height 5' 6.93" (1.7 m), weight 85.1 kg (187 lb 9.8 oz), SpO2 97 %. Physical Exam  Vitals reviewed. Constitutional: He appears well-developed and well-nourished.  HENT:  Head: Normocephalic and atraumatic.  Eyes: Right eye exhibits no discharge. Left eye exhibits no discharge.  Pupils round and reactive to light  Neck: Normal range of motion. Neck supple. No thyromegaly present.  Cardiovascular: Normal rate and regular rhythm.   Respiratory: Effort normal and breath sounds normal.  GI: Soft. Bowel sounds are normal. He exhibits no distension.  Musculoskeletal: He exhibits no edema or tenderness.  Neurological:  Lethargic male but arousable.  Appears to have a left gaze preference but will scan to the right with prompting.  He can provide his name but cannot name place or hospital. Severe dysarthria Difficulty following commands, but  spontaneously moving RUE/RLE LUE/LUE appear flacced DTRs symmetric  Skin: Skin is warm and dry.  Psychiatric: His affect is blunt. His speech is delayed and slurred. He is slowed. Cognition and memory are impaired.    Results for orders placed or performed during the hospital encounter of 03/31/17 (from the past 24 hour(s))  Magnesium     Status: None   Collection Time: 04/03/17  7:10 AM  Result Value Ref Range   Magnesium 1.8 1.7 - 2.4 mg/dL  Phosphorus     Status: Abnormal   Collection Time: 04/03/17  7:10 AM  Result Value Ref Range   Phosphorus 2.4 (L) 2.5 - 4.6 mg/dL  CBC with Differential/Platelet     Status: Abnormal   Collection Time: 04/03/17  7:10 AM  Result Value Ref Range   WBC 16.1 (H) 4.0 - 10.5 K/uL   RBC 4.29 4.22 - 5.81 MIL/uL   Hemoglobin 13.3 13.0 - 17.0 g/dL   HCT 64.4 (L) 03.4 - 74.2 %   MCV 90.4 78.0 - 100.0 fL   MCH 31.0 26.0 - 34.0 pg   MCHC 34.3 30.0 - 36.0 g/dL   RDW 59.5 63.8 - 75.6 %   Platelets 120 (L) 150 - 400 K/uL   Neutrophils Relative % 86 %   Neutro Abs 13.9 (H) 1.7 - 7.7 K/uL   Lymphocytes Relative 8 %   Lymphs Abs 1.2 0.7 - 4.0 K/uL   Monocytes Relative 6 %   Monocytes Absolute 1.0 0.1 - 1.0 K/uL   Eosinophils Relative 0 %   Eosinophils Absolute 0.0 0.0 - 0.7 K/uL   Basophils Relative 0 %   Basophils  Absolute 0.0 0.0 - 0.1 K/uL   Ct Head Wo Contrast  Result Date: 04/01/2017 CLINICAL DATA:  Continued surveillance RIGHT ICA and MCA occlusion. LEFT-sided weakness. EXAM: CT HEAD WITHOUT CONTRAST TECHNIQUE: Contiguous axial images were obtained from the base of the skull through the vertex without intravenous contrast. COMPARISON:  Multiple priors. Most recent CT head 03/31/2017. CTA head neck and CT perfusion 03/31/2017. FINDINGS: Brain: There is developing bland and hemorrhagic infarction in the RIGHT hemisphere, corresponding roughly to the area of infarcted core of brain on CT perfusion, CBF < 30%. Along the inferior RIGHT frontal lobe and  insula, images 13-15, developing hypoattenuation represents cytotoxic edema. More superiorly, in the RIGHT lentiform nucleus extending toward the centrum semiovale, images 16-19, cloud-like hyperattenuation, in the setting of washout of previous iodinated contrast/blood pool, is consistent with petechial hemorrhage within infarcted tissue. No subarachnoid blood. Mild mass effect on the RIGHT frontal horn, but insignificant RIGHT-to-LEFT shift. No extra-axial collection. Vascular: No hyperdense vessel or unexpected calcification. Skull: Normal. Negative for fracture or focal lesion. Sinuses/Orbits: No acute finding. Other: None. IMPRESSION: Developing bland and hemorrhagic infarction in the RIGHT hemisphere corresponding roughly to the area of infarcted core of brain on recent CT perfusion, see discussion above. Electronically Signed   By: Elsie Stain M.D.   On: 04/01/2017 15:18   Mr Brain Wo Contrast  Result Date: 04/01/2017 CLINICAL DATA:  Follow-up RIGHT MCA stroke. EXAM: MRI HEAD WITHOUT CONTRAST TECHNIQUE: Multiplanar, multiecho pulse sequences of the brain and surrounding structures were obtained without intravenous contrast. COMPARISON:  CT HEAD April 01, 2017 at 1447 hours FINDINGS: BRAIN: Confluent reduced diffusion RIGHT frontal lobes with smaller foci of reduced diffusion RIGHT parietal and temporal lobe, intermediate to low ADC values. Susceptibility artifact RIGHT basal ganglia and insula corresponding to CT findings without further propagation by MRI. FLAIR T2 hyperintense signal within areas of acute infarction with regional mass effect, no significant midline shift. Partial effacement of the RIGHT lateral ventricle without hydrocephalus. Patchy additional supratentorial white matter FLAIR T2 hyperintensities compatible with mild chronic small vessel ischemic disease. No abnormal extra-axial fluid collections. VASCULAR: Normal major intracranial vascular flow voids present at skull base. SKULL  AND UPPER CERVICAL SPINE: No abnormal sellar expansion. No suspicious calvarial bone marrow signal. Craniocervical junction maintained. SINUSES/ORBITS: Moderate paranasal sinus mucosal thickening. Mastoid air cells are well aerated. The included ocular globes and orbital contents are non-suspicious. OTHER: Life-support lines in place. IMPRESSION: Acute large RIGHT MCA territory infarct with similar petechial hemorrhage. Otherwise negative noncontrast MRI head for age. Electronically Signed   By: Awilda Metro M.D.   On: 04/01/2017 23:12   Dg Chest Port 1 View  Result Date: 04/02/2017 CLINICAL DATA:  Acute cerebral infarction.  LEFT-sided weakness. EXAM: PORTABLE CHEST 1 VIEW COMPARISON:  04/01/2017. FINDINGS: Support tubes and lines stable. Bibasilar atelectasis, worse on the LEFT. Increasing small LEFT effusion. No pneumothorax. IMPRESSION: Slight worsening aeration. Electronically Signed   By: Elsie Stain M.D.   On: 04/02/2017 07:29    Assessment/Plan: Diagnosis: Right MCA infarct Labs and images independently reviewed.  Records reviewed and summated above. Stroke: Continue secondary stroke prophylaxis and Risk Factor Modification listed below:   Antiplatelet therapy:   Blood Pressure Management:  Continue current medication with prn's with permisive HTN per primary team Statin Agent:   Prediabetes management:   Left sided hemiparesis: fit for orthosis to prevent contractures (resting hand splint for day, wrist cock up splint at night, PRAFO, etc) Motor recovery: Fluoxetine  1. Does  the need for close, 24 hr/day medical supervision in concert with the patient's rehab needs make it unreasonable for this patient to be served in a less intensive setting? Yes 2. Co-Morbidities requiring supervision/potential complications: Dysphagia (advance diet as tolerated), tachypnea (monitor RR and O2 Sats with increased physical exertion), Prediabetes (Monitor in accordance with exercise and adjust meds  as necessary), leukocytosis (cont to monitor for signs and symptoms of infection, further workup if indicated), hypophosphatemia (cont to monitor, replete as necessary), CKD (avoid nephrotoxic meds) 3. Due to bladder management, bowel management, safety, disease management, medication administration and patient education, does the patient require 24 hr/day rehab nursing? Yes 4. Does the patient require coordinated care of a physician, rehab nurse, PT (1-2 hrs/day, 5 days/week), OT (1-2 hrs/day, 5 days/week) and SLP (1-2 hrs/day, 5 days/week) to address physical and functional deficits in the context of the above medical diagnosis(es)? Yes Addressing deficits in the following areas: balance, endurance, locomotion, strength, transferring, bowel/bladder control, bathing, dressing, feeding, grooming, toileting, cognition, speech, language, swallowing and psychosocial support 5. Can the patient actively participate in an intensive therapy program of at least 3 hrs of therapy per day at least 5 days per week? Potentially 6. The potential for patient to make measurable gains while on inpatient rehab is good 7. Anticipated functional outcomes upon discharge from inpatient rehab are min assist and mod assist  with PT, min assist and mod assist with OT, supervision and min assist with SLP. 8. Estimated rehab length of stay to reach the above functional goals is: 22-26 days. 9. Does the patient have adequate social supports and living environment to accommodate these discharge functional goals? No 10. Anticipated D/C setting: Other 11. Anticipated post D/C treatments: SNF 12. Overall Rehab/Functional Prognosis: good and fair  RECOMMENDATIONS: This patient's condition is appropriate for continued rehabilitative care in the following setting: Patient without caregiver support, recommend SNF with PM&R follow up. Patient has agreed to participate in recommended program. Potentially Note that insurance prior  authorization may be required for reimbursement for recommended care.  Comment: Rehab Admissions Coordinator to follow up.  Maryla Morrow, MD, [place] Dom Charlton Amor., PA-C 04/03/2017

## 2017-04-03 NOTE — Progress Notes (Signed)
Patient ID: Matthew Meza, male   DOB: 1941-07-14, 76 y.o.   MRN: [id number]    Referring Physician(s): Dr. 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  He knows the year, but does not know where he is.  Allergies: No known allergies  Medications: Prior to Admission medications   Medication Sig Start Date End Date Taking? Authorizing Provider  b complex vitamins tablet Take 1 tablet by mouth every other day.    Historical Provider, MD  Cholecalciferol (VITAMIN D) 400 UNITS capsule Take 400 Units by mouth daily.    Historical Provider, MD  Garlic 1000 MG CAPS Take 1 capsule by mouth every other day.    Historical Provider, MD  Lactobacillus Rhamnosus, GG, ([employer] PROBIOTIC, LACTOBACILLUS,) CAPS Use as directed on the bottle. Patient not taking: Reported on 10/10/2015 04/02/15   Ofilia Neas, PA-C  magnesium 30 MG tablet Take 30 mg by mouth every other day.    Historical Provider, MD  Multiple Vitamin (MULTIVITAMIN WITH MINERALS) TABS Take 1 tablet by mouth daily.    Historical Provider, MD  predniSONE (DELTASONE) 10 MG tablet 6-5-4-3-2-1 po pc for sinus Patient not taking: Reported on 10/10/2015 09/08/14   Ashley Jacobs Guest, MD  psyllium (METAMUCIL SMOOTH TEXTURE) 28 % packet Take 1 packet by mouth 2 (two) times daily. Patient not taking: Reported on 10/10/2015 04/02/15   Ofilia Neas, PA-C    Vital Signs: BP 126/72   Pulse 65   Temp 99.1 F (37.3 C) (Oral)   Resp 20   Ht 5' 6.93" (1.7 m)   Wt 187 lb 9.8 oz (85.1 kg)   SpO2 92%   BMI 29.45 kg/m   Physical Exam: Gen: NAD Neuro: left sided hemiparetic.  He has a left gaze preference. He has good grip strength with his RUE. Skin: R CFA site is c/d/I with no evidence of hematoma  Imaging: Ct Angio Head W Or Wo Contrast  Result Date: 03/31/2017 CLINICAL DATA:   76 year old male with left side weakness and evidence of right MCA emergent large vessel occlusion on noncontrast head CT. EXAM: CT ANGIOGRAPHY HEAD AND NECK CT PERFUSION BRAIN TECHNIQUE: Multidetector CT imaging of the head and neck was performed using the standard protocol during bolus administration of intravenous contrast. Multiplanar CT image reconstructions and MIPs were obtained to evaluate the vascular anatomy. Carotid stenosis measurements (when applicable) are obtained utilizing NASCET criteria, using the distal internal carotid diameter as the denominator. Multiphase CT imaging of the brain was performed following IV bolus contrast injection. Subsequent parametric perfusion maps were calculated using RAPID software. CONTRAST:  90 mL Isovue 370 COMPARISON:  Noncontrast head CT 1048 hours today. FINDINGS: CT Brain Perfusion Findings: CBF (<30%) Volume: 40 mL (using CBF less than 30%). Perfusion (Tmax>6.0s) volume: 185 mL (T-max greater than 6 seconds). Mismatch Volume: 146 mL (mismatch  ratio 4.6). Infarction Location:Right MCA Perfusion findings were reviewed in person with Dr. Ritta Slot at 1059 hours. CTA NECK Skeleton: No acute osseous abnormality identified. Age concordant degenerative changes in the cervical spine. Intermittent carious dentition. Upper chest: Negative; no superior mediastinal lymphadenopathy. There is calcified coronary artery atherosclerosis. Other neck: Negative thyroid, larynx, pharynx, parapharyngeal spaces, retropharyngeal space, sublingual space, submandibular glands and parotid glands. No cervical lymphadenopathy. Aortic arch: Moderate soft and atherosclerotic arch atherosclerosis. Three vessel arch configuration. Right carotid system: No  brachiocephalic artery or right CCA origin stenosis. The right carotid bifurcation is patent, but the right ICA origin is occluded abruptly 9 mm beyond its origin (series 10, image 39). The right ICA remains occluded to the skull  base. Left carotid system: No left CCA origin stenosis. Medial soft plaque throughout the left CCA at the level of the thyroid, not hemodynamically significant. Soft and calcified plaque at the left ICA origin and bulb, not hemodynamically significant. Mid and distal cervical left ICA air negative. Vertebral arteries:No proximal right subclavian artery stenosis. Normal right vertebral artery origin. Negative cervical right vertebral artery. No proximal left subclavian artery stenosis despite soft plaque. Soft plaque at the left vertebral artery origin and V1 segment with only mild stenosis. Otherwise negative cervical left vertebral artery. CTA HEAD Posterior circulation: Codominant distal vertebral arteries without stenosis. Patent PICA origins. Patent vertebrobasilar junction. No basilar stenosis. Normal SCA and PCA origins. Posterior communicating arteries are diminutive or absent. Mild bilateral P1 and P2 segment irregularity, no PCA stenosis identified. Anterior circulation: Left ICA siphon is patent with mild calcified plaque and no stenosis. Left ICA terminus, left MCA and left ACA origins are normal. Right ICA siphon is occluded through the cavernous segment. Beginning at the anterior genu there is reconstitution of the right ICA siphon, and the right ICA terminus is patent. The right MCA origin is patent but the mid M1 segment is occluded about 11 mm from the origin. There is minimal reconstituted enhancement in the proximal M2 segments. The right ACA origin is patent but diminutive and appears somewhat stenotic (series 8, image 89). The left A1 segment may be dominant. A patent anterior communicating artery is identified. The bilateral ACA A2 segments and distal ACA branches appear symmetric and within normal limits. Venous sinuses: Patent. Anatomic variants: Possibly dominant left ACA A1 segment. Review of the MIP images confirms the above findings IMPRESSION: 1. Positive for emergent large vessel  occlusion, and favorable CT perfusion findings for endovascular re-perfusion. 2. Occlusion of the Right ICA just beyond its origin. Reconstitution of the Right ICA terminus beginning at the anterior genu (perhaps via the right ophthalmic artery). But superimposed Right MCA mid M1 segment occlusion. 3. CT perfusion indicates a core infarct of 40 mL in the Right MCA territory and right hemisphere penumbra of 146 mL. 4. The above was relayed to Dr. Ritta Slot beginning at 1059 hours. 5. Stenosis of the right ACA origin, but the right A1 segment may be non dominant. Right A2 and distal ACA enhancement is within normal limits. Normal left MCA and left ACA. 6. Aortic arch and bilateral carotid atherosclerosis without additional stenosis. 7. Mild left vertebral artery origin stenosis due to soft plaque. Otherwise negative posterior circulation. 8. Calcified coronary artery atherosclerosis. Electronically Signed   By: Odessa Fleming M.D.   On: 03/31/2017 11:22   Ct Head Wo Contrast  Result Date: 04/01/2017 CLINICAL DATA:  Continued surveillance RIGHT ICA and MCA occlusion. LEFT-sided weakness. EXAM: CT HEAD WITHOUT CONTRAST TECHNIQUE: Contiguous axial images were obtained from the base of the skull through the vertex without intravenous contrast. COMPARISON:  Multiple priors. Most recent CT head 03/31/2017. CTA head neck and CT perfusion 03/31/2017. FINDINGS: Brain: There is developing bland and hemorrhagic infarction in the RIGHT hemisphere, corresponding roughly to the area of infarcted core of brain on CT perfusion, CBF < 30%. Along the inferior RIGHT frontal lobe and insula, images 13-15, developing hypoattenuation represents cytotoxic edema. More superiorly, in the RIGHT lentiform nucleus extending toward  the centrum semiovale, images 16-19, cloud-like hyperattenuation, in the setting of washout of previous iodinated contrast/blood pool, is consistent with petechial hemorrhage within infarcted tissue. No  subarachnoid blood. Mild mass effect on the RIGHT frontal horn, but insignificant RIGHT-to-LEFT shift. No extra-axial collection. Vascular: No hyperdense vessel or unexpected calcification. Skull: Normal. Negative for fracture or focal lesion. Sinuses/Orbits: No acute finding. Other: None. IMPRESSION: Developing bland and hemorrhagic infarction in the RIGHT hemisphere corresponding roughly to the area of infarcted core of brain on recent CT perfusion, see discussion above. Electronically Signed   By: Elsie Stain M.D.   On: 04/01/2017 15:18   Ct Head Wo Contrast  Result Date: 03/31/2017 CLINICAL DATA:  Status post revascularization of the occluded right internal carotid artery and right middle cerebral artery. Left-sided weakness. EXAM: CT HEAD WITHOUT CONTRAST TECHNIQUE: Contiguous axial images were obtained from the base of the skull through the vertex without intravenous contrast. COMPARISON:  CT head without contrast from the same day. FINDINGS: Brain: Hyperdensity is present within the right lentiform nucleus along the area of core infarct. There is also hyperdensity along the body of the body. Hyperdense cortex is present in the right frontal operculum. There is focal density along the posterior watershed territory and over the right frontal convexity. The left basal ganglia are intact. No acute or focal left-sided infarct is present. There is some swelling of the sulci on the right. A small amount of subarachnoid blood may present on the right. The ventricles are of normal size. There is no intraventricular hemorrhage. No extra-axial hemorrhage is present on the left. The brainstem and cerebellum are normal. Vascular: Atherosclerotic calcifications are present in the cavernous internal carotid arteries. Contrast is present within the major intracranial arteries and dural sinuses. Skull: The calvarium is intact. Sinuses/Orbits: Extensive sinus disease is again noted. Fluid is present in the nasopharynx,  likely associated with intubation. This has increased. IMPRESSION: 1. Extensive areas of hyperdensity involving the basal ganglia and right MCA territory cortex likely reflecting luxury perfusion associated with the areas of acute infarction following revascularization. Hemorrhage is not entirely excluded, but felt to be a minority of the hyperdensity visualized. The areas largely correspond to the areas of core infarction on the CTP study. 2. A tiny amount of subarachnoid hemorrhage may be present without intraventricular hemorrhage. Electronically Signed   By: Marin Roberts M.D.   On: 03/31/2017 16:02   Ct Angio Neck W Or Wo Contrast  Result Date: 03/31/2017 CLINICAL DATA:  76 year old male with left side weakness and evidence of right MCA emergent large vessel occlusion on noncontrast head CT. EXAM: CT ANGIOGRAPHY HEAD AND NECK CT PERFUSION BRAIN TECHNIQUE: Multidetector CT imaging of the head and neck was performed using the standard protocol during bolus administration of intravenous contrast. Multiplanar CT image reconstructions and MIPs were obtained to evaluate the vascular anatomy. Carotid stenosis measurements (when applicable) are obtained utilizing NASCET criteria, using the distal internal carotid diameter as the denominator. Multiphase CT imaging of the brain was performed following IV bolus contrast injection. Subsequent parametric perfusion maps were calculated using RAPID software. CONTRAST:  90 mL Isovue 370 COMPARISON:  Noncontrast head CT 1048 hours today. FINDINGS: CT Brain Perfusion Findings: CBF (<30%) Volume: 40 mL (using CBF less than 30%). Perfusion (Tmax>6.0s) volume: 185 mL (T-max greater than 6 seconds). Mismatch Volume: 146 mL (mismatch  ratio 4.6). Infarction Location:Right MCA Perfusion findings were reviewed in person with Dr. Ritta Slot at 1059 hours. CTA NECK Skeleton: No acute osseous abnormality  identified. Age concordant degenerative changes in the cervical  spine. Intermittent carious dentition. Upper chest: Negative; no superior mediastinal lymphadenopathy. There is calcified coronary artery atherosclerosis. Other neck: Negative thyroid, larynx, pharynx, parapharyngeal spaces, retropharyngeal space, sublingual space, submandibular glands and parotid glands. No cervical lymphadenopathy. Aortic arch: Moderate soft and atherosclerotic arch atherosclerosis. Three vessel arch configuration. Right carotid system: No brachiocephalic artery or right CCA origin stenosis. The right carotid bifurcation is patent, but the right ICA origin is occluded abruptly 9 mm beyond its origin (series 10, image 39). The right ICA remains occluded to the skull base. Left carotid system: No left CCA origin stenosis. Medial soft plaque throughout the left CCA at the level of the thyroid, not hemodynamically significant. Soft and calcified plaque at the left ICA origin and bulb, not hemodynamically significant. Mid and distal cervical left ICA air negative. Vertebral arteries:No proximal right subclavian artery stenosis. Normal right vertebral artery origin. Negative cervical right vertebral artery. No proximal left subclavian artery stenosis despite soft plaque. Soft plaque at the left vertebral artery origin and V1 segment with only mild stenosis. Otherwise negative cervical left vertebral artery. CTA HEAD Posterior circulation: Codominant distal vertebral arteries without stenosis. Patent PICA origins. Patent vertebrobasilar junction. No basilar stenosis. Normal SCA and PCA origins. Posterior communicating arteries are diminutive or absent. Mild bilateral P1 and P2 segment irregularity, no PCA stenosis identified. Anterior circulation: Left ICA siphon is patent with mild calcified plaque and no stenosis. Left ICA terminus, left MCA and left ACA origins are normal. Right ICA siphon is occluded through the cavernous segment. Beginning at the anterior genu there is reconstitution of the right  ICA siphon, and the right ICA terminus is patent. The right MCA origin is patent but the mid M1 segment is occluded about 11 mm from the origin. There is minimal reconstituted enhancement in the proximal M2 segments. The right ACA origin is patent but diminutive and appears somewhat stenotic (series 8, image 89). The left A1 segment may be dominant. A patent anterior communicating artery is identified. The bilateral ACA A2 segments and distal ACA branches appear symmetric and within normal limits. Venous sinuses: Patent. Anatomic variants: Possibly dominant left ACA A1 segment. Review of the MIP images confirms the above findings IMPRESSION: 1. Positive for emergent large vessel occlusion, and favorable CT perfusion findings for endovascular re-perfusion. 2. Occlusion of the Right ICA just beyond its origin. Reconstitution of the Right ICA terminus beginning at the anterior genu (perhaps via the right ophthalmic artery). But superimposed Right MCA mid M1 segment occlusion. 3. CT perfusion indicates a core infarct of 40 mL in the Right MCA territory and right hemisphere penumbra of 146 mL. 4. The above was relayed to Dr. Ritta Slot beginning at 1059 hours. 5. Stenosis of the right ACA origin, but the right A1 segment may be non dominant. Right A2 and distal ACA enhancement is within normal limits. Normal left MCA and left ACA. 6. Aortic arch and bilateral carotid atherosclerosis without additional stenosis. 7. Mild left vertebral artery origin stenosis due to soft plaque. Otherwise negative posterior circulation. 8. Calcified coronary artery atherosclerosis. Electronically Signed   By: Odessa Fleming M.D.   On: 03/31/2017 11:22   Mr Brain Wo Contrast  Result Date: 04/01/2017 CLINICAL DATA:  Follow-up RIGHT MCA stroke. EXAM: MRI HEAD WITHOUT CONTRAST TECHNIQUE: Multiplanar, multiecho pulse sequences of the brain and surrounding structures were obtained without intravenous contrast. COMPARISON:  CT HEAD April 01, 2017 at 1447 hours FINDINGS: BRAIN: Confluent reduced  diffusion RIGHT frontal lobes with smaller foci of reduced diffusion RIGHT parietal and temporal lobe, intermediate to low ADC values. Susceptibility artifact RIGHT basal ganglia and insula corresponding to CT findings without further propagation by MRI. FLAIR T2 hyperintense signal within areas of acute infarction with regional mass effect, no significant midline shift. Partial effacement of the RIGHT lateral ventricle without hydrocephalus. Patchy additional supratentorial white matter FLAIR T2 hyperintensities compatible with mild chronic small vessel ischemic disease. No abnormal extra-axial fluid collections. VASCULAR: Normal major intracranial vascular flow voids present at skull base. SKULL AND UPPER CERVICAL SPINE: No abnormal sellar expansion. No suspicious calvarial bone marrow signal. Craniocervical junction maintained. SINUSES/ORBITS: Moderate paranasal sinus mucosal thickening. Mastoid air cells are well aerated. The included ocular globes and orbital contents are non-suspicious. OTHER: Life-support lines in place. IMPRESSION: Acute large RIGHT MCA territory infarct with similar petechial hemorrhage. Otherwise negative noncontrast MRI head for age. Electronically Signed   By: Awilda Metro M.D.   On: 04/01/2017 23:12   Ir Delsa Sale Stent Cerv Carotid W/o Emb-prot Mod Sed  Result Date: 04/03/2017 INDICATION: 76 year old male with a history of right MCA syndrome, with imaging workup revealing acute right ICA occlusion and right MCA occlusion (tandem occlusion), with increased NIH stroke scale, and aspects of 9. EXAM: ULTRASOUND GUIDED ACCESS RIGHT COMMON FEMORAL ARTERY. CEREBRAL ANGIOGRAM. REVASCULARIZATION OF OCCLUDED RIGHT INTERNAL CAROTID ARTERY AT THE BIFURCATION WITH BALLOON ANGIOPLASTY AND EMERGENT STENTING MECHANICAL THROMBECTOMY RIGHT MCA OCCLUSION DEPLOYMENT OF CLOSURE DEVICE FOR HEMOSTASIS COMPARISON:  CT 03/31/2017, CT angiogram  03/31/2017 MEDICATIONS: 2.0 g Ancef. The antibiotic was administered within 1 hour of the procedure ANESTHESIA/SEDATION: General anesthesia CONTRAST:  180 cc Isovue FLUOROSCOPY TIME:  Fluoroscopy Time: 44 minutes 18 seconds (3,731 mGy). COMPLICATIONS: None TECHNIQUE: Emergent informed written consent was obtained after discussion with the patient who confirmed he has no family available and no friends to speak on his behalf. Angiogram and thrombectomy was performed as the standard of care for a patient presentation such as his, after a discussion with the patient regarding risks and benefits. He did acknowledge wishing to proceed to be helped. Specific risks discussed include: Bleeding, infection, contrast reaction, kidney injury/failure, need for further procedure/surgery, arterial injury or dissection, embolization to new territory, intracranial hemorrhage (10-15% risk), neurologic deterioration, cardiopulmonary collapse, death. Maximal Sterile Barrier Technique was utilized including caps, mask, sterile gowns, sterile gloves, sterile drape, hand hygiene and skin antiseptic. A timeout was performed prior to the initiation of the procedure. PROCEDURE: Maximal Sterile Barrier Technique was utilized including during the procedure including caps, mask, sterile gowns, sterile gloves, sterile drape, hand hygiene and skin antiseptic. A timeout was performed prior to the initiation of the procedure. Ultrasound survey of the right inguinal region was performed with images stored and sent to PACs. A micropuncture needle was used access the right common femoral artery under ultrasound. With excellent arterial blood flow returned, an .018 micro wire was passed through the needle, observed to enter the abdominal aorta under fluoroscopy. The needle was removed, and a micropuncture sheath was placed over the wire. The inner dilator and wire were removed, and an 035 Bentson wire was advanced under fluoroscopy into the abdominal  aorta. The sheath was removed and a standard 5 [place] vascular sheath was placed. The dilator was removed and the sheath was flushed. A 58F JB-1 diagnostic catheter was advanced over the wire to the proximal descending thoracic aorta. Wire was then removed. Double flush of the catheter was performed. Catheter was then used to select  the innominate artery. Angiogram was performed. Catheter was then advanced into the right common carotid artery. Formal angiogram was performed. Exchange length Rosen wire was then passed through the diagnostic catheter to the distal common carotid artery and the diagnostic catheter was removed. The 5 French sheath was removed and exchanged for 8 French 55 centimeter BrightTip sheath. Sheath was flushed and attached to pressurized and heparinized saline bag for constant forward flow. Then a NeuroMax 80cm catheter was then advanced over the wire, positioned into the distal common carotid artery. Copious back flush was performed and the balloon catheter was attached to heparinized and pressurized saline bag for forward flow. . With the tip of the neuro on max position in the distal common carotid artery, micro wire and microcatheter were selected in attempt to cross the lesion at the bifurcation. A soft tipped Transcend wire and rapid transit microcatheter were selected. After the wire was navigated across the lesion, the microcatheter encountered significant resistance. The tension in the system caused the neuro on max catheter to buckle towards the aortic arch. A tandem wire using a roadrunner 035 wire was then placed adjacent to the microcatheter system with the tip of the roadrunner into the external carotid artery. With the buddy wire in place, the microcatheter was pushed across the lesion in the cervical carotid. After successfully crossing the lesion at the bifurcation, the wire and microcatheter were position in the distal cervical segment of the ICA. The wire was removed and  contrast was infused to confirm intraluminal location. Exchange length Transcend wire was then placed through the microcatheter terminating at the skullbase. The microcatheter was removed, and a 4 mm by 30 mm rapid exchange system Viatrak angioplasty balloon was selected for the initial dilation. Buddy wire remained in place for pushing the balloon across the lesion. Once the balloon was position at the lesion. Inflation to a full 8 atmospheric and nominal diameter was accomplished. A second balloon angioplasty was performed. Balloon was removed from the micro wire, and the roadrunner wire was removed. Angiogram was performed. Given the appearance of the angioplasty site and poor antegrade flow through the cervical carotid, we elected to place a stent prior to thrombectomy of the MCA. A tapered Abbott Xact 2mm/6mm x 40mm carotid stent was selected and deployed at the lesion. Repeat angiogram demonstrated improved flow with evidence of dissection continuing above the stented segment. Given there was adequate antegrade flow, we elected to proceed with mechanical thrombectomy. In intermediate CAT 6 catheter was then passed over the Transcend wire to the skullbase, with copious backflow performed. The hub of the rotating hemostatic valve was attached to pressurized heparinized saline back. Pro view 18 catheter was then selected and advanced over the exchange wire to the skullbase. The exchange wire was then removed and synchro soft micro wire was selected to navigate into the intracranial ICA. Angiogram through the intermediate catheter was performed at the skullbase, and the microcatheter and micro wire were advanced across the proximal M1 occlusion under roadmap angiogram. While the microcatheter was being advanced through the occlusion over the wire, the intermediate catheter was observed to travel retrograde in the cervical carotid. At this point, we recognized that the neuro on max catheter had withdrawn into the  aortic arch, and the entire system needed to be reduced in order to remove the redundancy of the telescoping system. Once the redundancy was removed, NeuronMax was positioned again in the distal common carotid artery, intermediate catheter at the skullbase, and the microcatheter beyond  the M1 occlusion, the synchro soft was removed and small injection through the microcatheter confirmed intraluminal location within insular branch location. A rotating hemostatic valve was then attached to the back end of the microcatheter, and a pressurized and heparinized saline bag was attached to the catheter. 4 x 40 solitaire device was then selected. Back flush was achieved at the rotating hemostatic valve, and then the device was gently advanced through the microcatheter to the distal end. The retriever was then unsheathed by withdrawing the microcatheter under fluoroscopy. Once the retriever was completely unsheathed, control angiogram was performed from the intermediate catheter. 3 minute time clock was observed. Constant aspiration was then performed at the tip of the intermediate catheter as the retriever was gently and slowly withdrawn with fluoroscopic observation. Once the retriever was entirely removed from the system, free aspiration was confirmed at the hub of the intermediate catheter, with free blood return confirmed. Control angiogram was then performed.  TICI 2b Second pass was then attempted. Microcatheter in the micro wire were then advanced through the intermediate catheter, navigated into the insular branches of the right MCA territory under roadmap angiogram. Once the microcatheter was passed distally into the selected branch, the wire was removed and aspiration at the hub of the macro catheter was performed to confirm return of blood. Gentle contrast injection confirmed intraluminal location within the selected insular branch. The same 4 x 40 solitaire device was then used. Back flush was achieved at the  rotating hemostatic valve on the microcatheter, and then the device was gently advanced through the microcatheter to the distal end. The retriever was then unsheathed by withdrawing the microcatheter under fluoroscopy. Once the retriever was completely unsheathed, control angiogram was performed from the intermediate catheter. 3 minute time clock was again observed. Constant aspiration was then performed at the tip of the intermediate catheter as the retriever was gently and slowly withdrawn with fluoroscopic observation. Once the retriever was entirely removed from the system, free aspiration was confirmed at the hub of the intermediate catheter, with free blood return confirmed. Control angiogram was performed with TICI 3 flow. Control angiogram from the NeuronMax within the common carotid artery was performed, demonstrating occlusion at the stent site. 035 Rosen wire was advanced through the intermediate catheter to the skullbase, a rotating hemostatic valve was placed on the back end of the intermediate catheter, an the intermediate catheter was withdrawn slowly under fluoroscopic observation with gentle contrast injection to identified the distal site of the thrombus in the cervical segment of the internal carotid artery. This site was just beyond the stent at the bifurcation. Intermediate catheter was then advanced to the skullbase over the [employer], Rosen wire was removed. An the exchange length Transcend wire was placed into the internal carotid artery. Intermediate catheter was withdrawn. A non tapered Acculink carotid stent 6 mm x 30mm was selected to match the distal 6 mm diameter of the previously placed stent. This stent was deployed under fluoroscopic road map, with slight distal migration, with the proximal stent margin just beyond the distal stent margin of the first stent. Delivery device was removed an a control angiogram was performed demonstrating hemodynamically significant stenosis at the  short interval between the stents. A third stent was then deployed at the interval, with an Abbott Xact, 7mm x 20mm stent selected. Delivery device was removed. Control angiogram was performed at the cervical region confirming no flow limiting stenosis with excellent flow through the stent system. Control angiogram was performed of the  intracranial vasculature demonstrating resolution of the flow limiting stenosis at the stent system with continued full reperfusion of the right hemisphere. NeuronMax was then removed. Diagnostic angiogram of the left carotid system was then performed using JB 1 catheter. All catheters wires were removed an and the 8 [place] by 55 cm bright tip sheath was exchanged for a short 8 [place] sheath. Control angiogram was performed at the right common femoral artery access site. Exoseal was deployed for hemostasis. Patient remained hemodynamically stable throughout. No complications were encountered and no significant blood loss encountered. FINDINGS: Ultrasound survey of the right inguinal region demonstrates widely patent common femoral artery with no significant plaque. Right common carotid artery: Initial angiogram of the right common carotid artery demonstrates normal course caliber and contour with no significant atherosclerotic changes. Right external carotid artery: Patent with antegrade flow. Right internal carotid artery: Occlusion of the right internal carotid artery just beyond the bifurcation with no string sign. Angiogram after balloon angioplasty of the carotid occlusion demonstrates hemodynamically significant stenosis with irregular lumen and spiral Ing filling defect extending from the proximal ICA nearly to the skullbase. There is associated luminal thrombus in the distal cervical segment. After the initial balloon angioplasty, there is tubular tubular and spiraling filling defect of the cavernous segment extending through the supraclinoid segment to the carotid terminus.  After treatment of the tandem occlusion and placement of stent system at the bifurcation extending into the right cervical ICA, there is adequate treatment of presumed dissection at the occlusion, as well as the associated luminal thrombus, with no hemodynamically significant stenosis and excellent flow through the stent system. After mechanical thrombectomy, the distal cervical ICA and the intracranial ICA to the carotid terminus demonstrate resolution of filling defect, which was presumed thrombus. Right MCA: Initial angiogram demonstrates proximal M1 occlusion with no flow. The distal MCA branches cross fill through anterior cerebral circulation and leptomeningeal collaterals. After the first pass with solitaire 4 x 40 and local aspiration there is TICI 2b flow established. Residual filling defect at the distal M1 extending into M2 branches, as well as residual filling defect in the carotid siphon. After the second pass with solitaire 4 x 40 and local aspiration there is complete restoration of flow within the right MCA compatible with TICI 3 flow. No residual filling defect in the carotid siphon. Before the stent system was completed at the carotid bifurcation, the intracranial injection demonstrated no flow within the right A1 segment, which was likely related to competing cross perfusion from the left. After stent system was completed at the carotid bifurcation, intracranial control angiogram demonstrates antegrade flow through the carotid siphon, right MCA, right ACA, with maintained TICI 3 flow. Right ACA: Initial injection demonstrates patency of the right A1 segment, with perfusion of the right ACA territory. There is cross filling through patent anterior communicating artery, with some perfusion of the left ACA territory. Before the completion of the right carotid stent system, hemodynamically significant stenosis precluded adequate filling of the A1 segment secondary to competing cross flow from the  left. After completion of the stent system, this flow phenomenon resolved, with complete filling of the right A1 and A2 branches. Left common carotid artery:  Normal course caliber and contour. Left ICA: Normal course caliber and contour with no significant atherosclerotic changes. Patent 81 segment and anterior communicating artery with cross flow to the right. No filling defect or intracranial stenosis. Left external carotid artery: Unremarkable course caliber and contour and remains patent. Right common femoral artery:  Adequate puncture site of the common femoral artery. 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 Deployment of Exoseal for hemostasis. Signed, Yvone Neu. Loreta Ave DO [facility] Radiology PLAN: Patient is stable to CT department for head CT Admit to neuro ICU. First 24 hours target systolic blood pressure of 120-140. Right hip straight for 6 hours status post removal of 8 French sheath. Electronically Signed   By: Gilmer Mor D.O.   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 EXAM: ULTRASOUND GUIDED ACCESS RIGHT COMMON FEMORAL ARTERY. CEREBRAL ANGIOGRAM. REVASCULARIZATION OF OCCLUDED RIGHT INTERNAL CAROTID ARTERY AT THE BIFURCATION WITH BALLOON ANGIOPLASTY AND EMERGENT STENTING MECHANICAL THROMBECTOMY RIGHT MCA OCCLUSION DEPLOYMENT OF CLOSURE DEVICE FOR HEMOSTASIS COMPARISON:  CT 03/31/2017, CT angiogram 03/31/2017 MEDICATIONS: 2.0 g Ancef. The  antibiotic was administered within 1 hour of the procedure ANESTHESIA/SEDATION: General anesthesia CONTRAST:  180 cc Isovue FLUOROSCOPY TIME:  Fluoroscopy Time: 44 minutes 18 seconds (3,731 mGy). COMPLICATIONS: None TECHNIQUE: Emergent informed written consent was obtained after discussion with the patient who confirmed he has no family available and no friends to speak on his behalf. Angiogram and thrombectomy was performed as the standard of care for a patient presentation such as his, after a discussion with the patient regarding risks and benefits. He did acknowledge wishing to proceed to be helped. Specific risks discussed include: Bleeding, infection, contrast reaction, kidney injury/failure, need for further procedure/surgery, arterial injury or dissection, embolization to new territory, intracranial hemorrhage (10-15% risk), neurologic deterioration, cardiopulmonary collapse, death. Maximal Sterile Barrier Technique was utilized including caps, mask, sterile gowns, sterile gloves, sterile drape, hand hygiene and skin antiseptic. A timeout was performed prior to the initiation of the procedure. PROCEDURE: Maximal Sterile Barrier Technique was utilized including during the procedure including caps, mask, sterile gowns, sterile gloves, sterile drape, hand hygiene and skin antiseptic. A timeout was performed prior to the initiation of the procedure. Ultrasound survey of the right inguinal region was performed with images stored and sent to PACs. A micropuncture needle was used access the right common femoral artery under ultrasound. With excellent arterial blood flow returned, an .018 micro wire was passed through the needle, observed to enter the abdominal aorta under fluoroscopy. The needle was removed, and a micropuncture sheath was placed over the wire. The inner dilator and wire were removed, and an 035 Bentson wire was advanced under fluoroscopy into the abdominal aorta. The sheath was removed and a  standard 5 [place] vascular sheath was placed. The dilator was removed and the sheath was flushed. A 54F JB-1 diagnostic catheter was advanced over the wire to the proximal descending thoracic aorta. Wire was then removed. Double flush of the catheter was performed. Catheter was then used to select the innominate artery. Angiogram was performed. Catheter was then advanced into the right common carotid artery. Formal angiogram was performed. Exchange length Rosen wire was then passed through the diagnostic catheter to the distal common carotid artery and the diagnostic catheter was removed. The 5 French sheath was removed and  exchanged for 8 French 55 centimeter BrightTip sheath. Sheath was flushed and attached to pressurized and heparinized saline bag for constant forward flow. Then a NeuroMax 80cm catheter was then advanced over the wire, positioned into the distal common carotid artery. Copious back flush was performed and the balloon catheter was attached to heparinized and pressurized saline bag for forward flow. . With the tip of the neuro on max position in the distal common carotid artery, micro wire and microcatheter were selected in attempt to cross the lesion at the bifurcation. A soft tipped Transcend wire and rapid transit microcatheter were selected. After the wire was navigated across the lesion, the microcatheter encountered significant resistance. The tension in the system caused the neuro on max catheter to buckle towards the aortic arch. A tandem wire using a roadrunner 035 wire was then placed adjacent to the microcatheter system with the tip of the roadrunner into the external carotid artery. With the buddy wire in place, the microcatheter was pushed across the lesion in the cervical carotid. After successfully crossing the lesion at the bifurcation, the wire and microcatheter were position in the distal cervical segment of the ICA. The wire was removed and contrast was infused to confirm  intraluminal location. Exchange length Transcend wire was then placed through the microcatheter terminating at the skullbase. The microcatheter was removed, and a 4 mm by 30 mm rapid exchange system Viatrak angioplasty balloon was selected for the initial dilation. Buddy wire remained in place for pushing the balloon across the lesion. Once the balloon was position at the lesion. Inflation to a full 8 atmospheric and nominal diameter was accomplished. A second balloon angioplasty was performed. Balloon was removed from the micro wire, and the roadrunner wire was removed. Angiogram was performed. Given the appearance of the angioplasty site and poor antegrade flow through the cervical carotid, we elected to place a stent prior to thrombectomy of the MCA. A tapered Abbott Xact 74mm/6mm x 40mm carotid stent was selected and deployed at the lesion. Repeat angiogram demonstrated improved flow with evidence of dissection continuing above the stented segment. Given there was adequate antegrade flow, we elected to proceed with mechanical thrombectomy. In intermediate CAT 6 catheter was then passed over the Transcend wire to the skullbase, with copious backflow performed. The hub of the rotating hemostatic valve was attached to pressurized heparinized saline back. Pro view 18 catheter was then selected and advanced over the exchange wire to the skullbase. The exchange wire was then removed and synchro soft micro wire was selected to navigate into the intracranial ICA. Angiogram through the intermediate catheter was performed at the skullbase, and the microcatheter and micro wire were advanced across the proximal M1 occlusion under roadmap angiogram. While the microcatheter was being advanced through the occlusion over the wire, the intermediate catheter was observed to travel retrograde in the cervical carotid. At this point, we recognized that the neuro on max catheter had withdrawn into the aortic arch, and the entire  system needed to be reduced in order to remove the redundancy of the telescoping system. Once the redundancy was removed, NeuronMax was positioned again in the distal common carotid artery, intermediate catheter at the skullbase, and the microcatheter beyond the M1 occlusion, the synchro soft was removed and small injection through the microcatheter confirmed intraluminal location within insular branch location. A rotating hemostatic valve was then attached to the back end of the microcatheter, and a pressurized and heparinized saline bag was attached to the catheter. 4 x 40  solitaire device was then selected. Back flush was achieved at the rotating hemostatic valve, and then the device was gently advanced through the microcatheter to the distal end. The retriever was then unsheathed by withdrawing the microcatheter under fluoroscopy. Once the retriever was completely unsheathed, control angiogram was performed from the intermediate catheter. 3 minute time clock was observed. Constant aspiration was then performed at the tip of the intermediate catheter as the retriever was gently and slowly withdrawn with fluoroscopic observation. Once the retriever was entirely removed from the system, free aspiration was confirmed at the hub of the intermediate catheter, with free blood return confirmed. Control angiogram was then performed.  TICI 2b Second pass was then attempted. Microcatheter in the micro wire were then advanced through the intermediate catheter, navigated into the insular branches of the right MCA territory under roadmap angiogram. Once the microcatheter was passed distally into the selected branch, the wire was removed and aspiration at the hub of the macro catheter was performed to confirm return of blood. Gentle contrast injection confirmed intraluminal location within the selected insular branch. The same 4 x 40 solitaire device was then used. Back flush was achieved at the rotating hemostatic valve on  the microcatheter, and then the device was gently advanced through the microcatheter to the distal end. The retriever was then unsheathed by withdrawing the microcatheter under fluoroscopy. Once the retriever was completely unsheathed, control angiogram was performed from the intermediate catheter. 3 minute time clock was again observed. Constant aspiration was then performed at the tip of the intermediate catheter as the retriever was gently and slowly withdrawn with fluoroscopic observation. Once the retriever was entirely removed from the system, free aspiration was confirmed at the hub of the intermediate catheter, with free blood return confirmed. Control angiogram was performed with TICI 3 flow. Control angiogram from the NeuronMax within the common carotid artery was performed, demonstrating occlusion at the stent site. 035 Rosen wire was advanced through the intermediate catheter to the skullbase, a rotating hemostatic valve was placed on the back end of the intermediate catheter, an the intermediate catheter was withdrawn slowly under fluoroscopic observation with gentle contrast injection to identified the distal site of the thrombus in the cervical segment of the internal carotid artery. This site was just beyond the stent at the bifurcation. Intermediate catheter was then advanced to the skullbase over the [employer], Rosen wire was removed. An the exchange length Transcend wire was placed into the internal carotid artery. Intermediate catheter was withdrawn. A non tapered Acculink carotid stent 6 mm x 30mm was selected to match the distal 6 mm diameter of the previously placed stent. This stent was deployed under fluoroscopic road map, with slight distal migration, with the proximal stent margin just beyond the distal stent margin of the first stent. Delivery device was removed an a control angiogram was performed demonstrating hemodynamically significant stenosis at the short interval between the  stents. A third stent was then deployed at the interval, with an Abbott Xact, 7mm x 20mm stent selected. Delivery device was removed. Control angiogram was performed at the cervical region confirming no flow limiting stenosis with excellent flow through the stent system. Control angiogram was performed of the intracranial vasculature demonstrating resolution of the flow limiting stenosis at the stent system with continued full reperfusion of the right hemisphere. NeuronMax was then removed. Diagnostic angiogram of the left carotid system was then performed using JB 1 catheter. All catheters wires were removed an and the 8 [place] by  55 cm bright tip sheath was exchanged for a short 8 [place] sheath. Control angiogram was performed at the right common femoral artery access site. Exoseal was deployed for hemostasis. Patient remained hemodynamically stable throughout. No complications were encountered and no significant blood loss encountered. FINDINGS: Ultrasound survey of the right inguinal region demonstrates widely patent common femoral artery with no significant plaque. Right common carotid artery: Initial angiogram of the right common carotid artery demonstrates normal course caliber and contour with no significant atherosclerotic changes. Right external carotid artery: Patent with antegrade flow. Right internal carotid artery: Occlusion of the right internal carotid artery just beyond the bifurcation with no string sign. Angiogram after balloon angioplasty of the carotid occlusion demonstrates hemodynamically significant stenosis with irregular lumen and spiral Ing filling defect extending from the proximal ICA nearly to the skullbase. There is associated luminal thrombus in the distal cervical segment. After the initial balloon angioplasty, there is tubular tubular and spiraling filling defect of the cavernous segment extending through the supraclinoid segment to the carotid terminus. After treatment of the  tandem occlusion and placement of stent system at the bifurcation extending into the right cervical ICA, there is adequate treatment of presumed dissection at the occlusion, as well as the associated luminal thrombus, with no hemodynamically significant stenosis and excellent flow through the stent system. After mechanical thrombectomy, the distal cervical ICA and the intracranial ICA to the carotid terminus demonstrate resolution of filling defect, which was presumed thrombus. Right MCA: Initial angiogram demonstrates proximal M1 occlusion with no flow. The distal MCA branches cross fill through anterior cerebral circulation and leptomeningeal collaterals. After the first pass with solitaire 4 x 40 and local aspiration there is TICI 2b flow established. Residual filling defect at the distal M1 extending into M2 branches, as well as residual filling defect in the carotid siphon. After the second pass with solitaire 4 x 40 and local aspiration there is complete restoration of flow within the right MCA compatible with TICI 3 flow. No residual filling defect in the carotid siphon. Before the stent system was completed at the carotid bifurcation, the intracranial injection demonstrated no flow within the right A1 segment, which was likely related to competing cross perfusion from the left. After stent system was completed at the carotid bifurcation, intracranial control angiogram demonstrates antegrade flow through the carotid siphon, right MCA, right ACA, with maintained TICI 3 flow. Right ACA: Initial injection demonstrates patency of the right A1 segment, with perfusion of the right ACA territory. There is cross filling through patent anterior communicating artery, with some perfusion of the left ACA territory. Before the completion of the right carotid stent system, hemodynamically significant stenosis precluded adequate filling of the A1 segment secondary to competing cross flow from the left. After completion of  the stent system, this flow phenomenon resolved, with complete filling of the right A1 and A2 branches. Left common carotid artery:  Normal course caliber and contour. Left ICA: Normal course caliber and contour with no significant atherosclerotic changes. Patent 81 segment and anterior communicating artery with cross flow to the right. No filling defect or intracranial stenosis. Left external carotid artery: Unremarkable course caliber and contour and remains patent. Right common femoral artery: Adequate puncture site of the common femoral artery. 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 Deployment of Exoseal for hemostasis. Signed, Yvone Neu. Loreta Ave DO [facility] Radiology PLAN: Patient is stable to CT department for head CT Admit to neuro ICU. First 24 hours target systolic blood pressure of 120-140. Right hip straight for 6 hours status post removal of 8 French sheath. Electronically Signed   By: Gilmer Mor D.O.   On: 04/01/2017 11:23   Ct Cerebral Perfusion W Contrast  Result Date: 03/31/2017 CLINICAL DATA:  76 year old male with left side weakness and evidence of right MCA emergent large vessel occlusion on noncontrast head CT. EXAM: CT ANGIOGRAPHY HEAD AND NECK CT PERFUSION BRAIN TECHNIQUE: Multidetector CT imaging of the head and neck was performed using the standard protocol during bolus administration of intravenous contrast. Multiplanar CT image reconstructions and MIPs were obtained to evaluate the vascular anatomy. Carotid stenosis measurements (when applicable) are obtained utilizing NASCET criteria, using the distal internal carotid diameter as the denominator. Multiphase CT imaging of  the brain was performed following IV bolus contrast injection. Subsequent parametric perfusion maps were calculated using RAPID software. CONTRAST:  90 mL Isovue 370 COMPARISON:  Noncontrast head CT 1048 hours today. FINDINGS: CT Brain Perfusion Findings: CBF (<30%) Volume: 40 mL (using CBF less than 30%). Perfusion (Tmax>6.0s) volume: 185 mL (T-max greater than 6 seconds). Mismatch Volume: 146 mL (mismatch  ratio 4.6). Infarction Location:Right MCA Perfusion findings were reviewed in person with Dr. Ritta Slot at 1059 hours. CTA NECK Skeleton: No acute osseous abnormality identified. Age concordant degenerative changes in the cervical spine. Intermittent carious dentition. Upper chest: Negative; no superior mediastinal lymphadenopathy. There is calcified coronary artery atherosclerosis. Other neck: Negative thyroid, larynx, pharynx, parapharyngeal spaces, retropharyngeal space, sublingual space, submandibular glands and parotid glands. No cervical lymphadenopathy. Aortic arch: Moderate soft and atherosclerotic arch atherosclerosis. Three vessel arch configuration. Right carotid system: No brachiocephalic artery or right CCA origin stenosis. The right carotid bifurcation is patent, but the right ICA origin is occluded abruptly 9 mm beyond its origin (series 10, image 39). The right ICA remains occluded to the skull base. Left carotid system: No left CCA origin stenosis. Medial soft plaque throughout the left CCA at the level of the thyroid, not hemodynamically significant. Soft and calcified plaque at the left ICA origin and bulb, not hemodynamically significant. Mid and distal cervical left ICA air negative. Vertebral arteries:No proximal right subclavian artery stenosis. Normal right vertebral artery origin. Negative cervical right vertebral artery. No proximal left subclavian artery stenosis despite soft plaque. Soft plaque at the left vertebral artery origin and V1 segment with only mild stenosis.  Otherwise negative cervical left vertebral artery. CTA HEAD Posterior circulation: Codominant distal vertebral arteries without stenosis. Patent PICA origins. Patent vertebrobasilar junction. No basilar stenosis. Normal SCA and PCA origins. Posterior communicating arteries are diminutive or absent. Mild bilateral P1 and P2 segment irregularity, no PCA stenosis identified. Anterior circulation: Left ICA siphon is patent with mild calcified plaque and no stenosis. Left ICA terminus, left MCA and left ACA origins are normal. Right ICA siphon is occluded through the cavernous segment. Beginning at the anterior genu there is reconstitution of the right ICA siphon, and the right ICA terminus is patent. The right MCA origin is patent but the mid M1 segment is occluded about 11 mm from the origin. There is minimal reconstituted enhancement in the proximal M2 segments. The right ACA origin is patent but diminutive and appears somewhat stenotic (series 8, image 89). The left A1 segment  may be dominant. A patent anterior communicating artery is identified. The bilateral ACA A2 segments and distal ACA branches appear symmetric and within normal limits. Venous sinuses: Patent. Anatomic variants: Possibly dominant left ACA A1 segment. Review of the MIP images confirms the above findings IMPRESSION: 1. Positive for emergent large vessel occlusion, and favorable CT perfusion findings for endovascular re-perfusion. 2. Occlusion of the Right ICA just beyond its origin. Reconstitution of the Right ICA terminus beginning at the anterior genu (perhaps via the right ophthalmic artery). But superimposed Right MCA mid M1 segment occlusion. 3. CT perfusion indicates a core infarct of 40 mL in the Right MCA territory and right hemisphere penumbra of 146 mL. 4. The above was relayed to Dr. Ritta Slot beginning at 1059 hours. 5. Stenosis of the right ACA origin, but the right A1 segment may be non dominant. Right A2 and distal ACA  enhancement is within normal limits. Normal left MCA and left ACA. 6. Aortic arch and bilateral carotid atherosclerosis without additional stenosis. 7. Mild left vertebral artery origin stenosis due to soft plaque. Otherwise negative posterior circulation. 8. Calcified coronary artery atherosclerosis. Electronically Signed   By: Odessa Fleming M.D.   On: 03/31/2017 11:22   Dg Chest Port 1 View  Result Date: 04/02/2017 CLINICAL DATA:  Acute cerebral infarction.  LEFT-sided weakness. EXAM: PORTABLE CHEST 1 VIEW COMPARISON:  04/01/2017. FINDINGS: Support tubes and lines stable. Bibasilar atelectasis, worse on the LEFT. Increasing small LEFT effusion. No pneumothorax. IMPRESSION: Slight worsening aeration. Electronically Signed   By: Elsie Stain M.D.   On: 04/02/2017 07:29   Dg Chest Port 1 View  Result Date: 04/01/2017 CLINICAL DATA:  Continued surveillance.  Stroke. EXAM: PORTABLE CHEST 1 VIEW COMPARISON:  03/31/2017. FINDINGS: Normal cardiomediastinal silhouette. ET tube good position. Early bibasilar atelectasis. No consolidation or frank edema. No pneumothorax. Enteric tube. IMPRESSION: Early bibasilar atelectasis similar to priors.  ETT stable. Electronically Signed   By: Elsie Stain M.D.   On: 04/01/2017 08:11   Dg Chest Port 1 View  Result Date: 03/31/2017 CLINICAL DATA:  Acute respiratory failure EXAM: PORTABLE CHEST 1 VIEW COMPARISON:  06/02/2013 FINDINGS: Endotracheal tube tip just below the clavicular heads. An orogastric tube reaches the stomach, with side port near the GE junction. Low volume chest with mild atelectasis. No edema, effusion, or pneumothorax. EKG leads create artifact over the chest. IMPRESSION: 1. Unremarkable positioning of endotracheal and orogastric tubes. 2. Low volumes with mild atelectasis. Electronically Signed   By: Marnee Spring M.D.   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 EXAM: ULTRASOUND GUIDED ACCESS RIGHT COMMON FEMORAL ARTERY. CEREBRAL ANGIOGRAM. REVASCULARIZATION OF OCCLUDED RIGHT INTERNAL CAROTID ARTERY AT THE BIFURCATION WITH BALLOON ANGIOPLASTY AND EMERGENT STENTING MECHANICAL THROMBECTOMY RIGHT MCA OCCLUSION DEPLOYMENT OF CLOSURE DEVICE FOR HEMOSTASIS COMPARISON:  CT 03/31/2017, CT angiogram 03/31/2017 MEDICATIONS: 2.0 g Ancef. The antibiotic was administered within 1 hour of the procedure ANESTHESIA/SEDATION: General anesthesia CONTRAST:  180 cc Isovue FLUOROSCOPY TIME:  Fluoroscopy Time: 44 minutes 18 seconds (3,731 mGy). COMPLICATIONS: None TECHNIQUE: Emergent informed written consent was obtained after discussion with the patient who confirmed he has no family available and no friends to speak on his behalf. Angiogram and thrombectomy was performed as the standard of care  for a patient presentation such as his, after a discussion with the patient regarding risks and benefits. He did acknowledge wishing to proceed to be helped. Specific risks discussed include: Bleeding, infection, contrast reaction, kidney injury/failure, need for further procedure/surgery, arterial injury or dissection, embolization to new territory, intracranial hemorrhage (10-15% risk), neurologic deterioration, cardiopulmonary collapse, death. Maximal Sterile Barrier Technique was utilized including caps, mask, sterile gowns, sterile gloves, sterile drape, hand hygiene and skin antiseptic. A timeout was performed prior to the initiation of the procedure. PROCEDURE: Maximal Sterile Barrier Technique was utilized including during the procedure including caps, mask, sterile gowns, sterile gloves, sterile drape, hand hygiene and skin antiseptic. A timeout was performed prior to the initiation of the  procedure. Ultrasound survey of the right inguinal region was performed with images stored and sent to PACs. A micropuncture needle was used access the right common femoral artery under ultrasound. With excellent arterial blood flow returned, an .018 micro wire was passed through the needle, observed to enter the abdominal aorta under fluoroscopy. The needle was removed, and a micropuncture sheath was placed over the wire. The inner dilator and wire were removed, and an 035 Bentson wire was advanced under fluoroscopy into the abdominal aorta. The sheath was removed and a standard 5 [place] vascular sheath was placed. The dilator was removed and the sheath was flushed. A 72F JB-1 diagnostic catheter was advanced over the wire to the proximal descending thoracic aorta. Wire was then removed. Double flush of the catheter was performed. Catheter was then used to select the innominate artery. Angiogram was performed. Catheter was then advanced into the right common carotid artery. Formal angiogram was performed. Exchange length Rosen wire was then passed through the diagnostic catheter to the distal common carotid artery and the diagnostic catheter was removed. The 5 French sheath was removed and exchanged for 8 French 55 centimeter BrightTip sheath. Sheath was flushed and attached to pressurized and heparinized saline bag for constant forward flow. Then a NeuroMax 80cm catheter was then advanced over the wire, positioned into the distal common carotid artery. Copious back flush was performed and the balloon catheter was attached to heparinized and pressurized saline bag for forward flow. . With the tip of the neuro on max position in the distal common carotid artery, micro wire and microcatheter were selected in attempt to cross the lesion at the bifurcation. A soft tipped Transcend wire and rapid transit microcatheter were selected. After the wire was navigated across the lesion, the microcatheter encountered significant  resistance. The tension in the system caused the neuro on max catheter to buckle towards the aortic arch. A tandem wire using a roadrunner 035 wire was then placed adjacent to the microcatheter system with the tip of the roadrunner into the external carotid artery. With the buddy wire in place, the microcatheter was pushed across the lesion in the cervical carotid. After successfully crossing the lesion at the bifurcation, the wire and microcatheter were position in the distal cervical segment of the ICA. The wire was removed and contrast was infused to confirm intraluminal location. Exchange length Transcend wire was then placed through the microcatheter terminating at the skullbase. The microcatheter was removed, and a 4 mm by 30 mm rapid exchange system Viatrak angioplasty balloon was selected for the initial dilation. Buddy wire remained in place for pushing the balloon across the lesion. Once the balloon was position at the lesion. Inflation to a full 8 atmospheric and nominal diameter was accomplished. A second balloon angioplasty was  performed. Balloon was removed from the micro wire, and the roadrunner wire was removed. Angiogram was performed. Given the appearance of the angioplasty site and poor antegrade flow through the cervical carotid, we elected to place a stent prior to thrombectomy of the MCA. A tapered Abbott Xact 23mm/6mm x 40mm carotid stent was selected and deployed at the lesion. Repeat angiogram demonstrated improved flow with evidence of dissection continuing above the stented segment. Given there was adequate antegrade flow, we elected to proceed with mechanical thrombectomy. In intermediate CAT 6 catheter was then passed over the Transcend wire to the skullbase, with copious backflow performed. The hub of the rotating hemostatic valve was attached to pressurized heparinized saline back. Pro view 18 catheter was then selected and advanced over the exchange wire to the skullbase. The exchange  wire was then removed and synchro soft micro wire was selected to navigate into the intracranial ICA. Angiogram through the intermediate catheter was performed at the skullbase, and the microcatheter and micro wire were advanced across the proximal M1 occlusion under roadmap angiogram. While the microcatheter was being advanced through the occlusion over the wire, the intermediate catheter was observed to travel retrograde in the cervical carotid. At this point, we recognized that the neuro on max catheter had withdrawn into the aortic arch, and the entire system needed to be reduced in order to remove the redundancy of the telescoping system. Once the redundancy was removed, NeuronMax was positioned again in the distal common carotid artery, intermediate catheter at the skullbase, and the microcatheter beyond the M1 occlusion, the synchro soft was removed and small injection through the microcatheter confirmed intraluminal location within insular branch location. A rotating hemostatic valve was then attached to the back end of the microcatheter, and a pressurized and heparinized saline bag was attached to the catheter. 4 x 40 solitaire device was then selected. Back flush was achieved at the rotating hemostatic valve, and then the device was gently advanced through the microcatheter to the distal end. The retriever was then unsheathed by withdrawing the microcatheter under fluoroscopy. Once the retriever was completely unsheathed, control angiogram was performed from the intermediate catheter. 3 minute time clock was observed. Constant aspiration was then performed at the tip of the intermediate catheter as the retriever was gently and slowly withdrawn with fluoroscopic observation. Once the retriever was entirely removed from the system, free aspiration was confirmed at the hub of the intermediate catheter, with free blood return confirmed. Control angiogram was then performed.  TICI 2b Second pass was then  attempted. Microcatheter in the micro wire were then advanced through the intermediate catheter, navigated into the insular branches of the right MCA territory under roadmap angiogram. Once the microcatheter was passed distally into the selected branch, the wire was removed and aspiration at the hub of the macro catheter was performed to confirm return of blood. Gentle contrast injection confirmed intraluminal location within the selected insular branch. The same 4 x 40 solitaire device was then used. Back flush was achieved at the rotating hemostatic valve on the microcatheter, and then the device was gently advanced through the microcatheter to the distal end. The retriever was then unsheathed by withdrawing the microcatheter under fluoroscopy. Once the retriever was completely unsheathed, control angiogram was performed from the intermediate catheter. 3 minute time clock was again observed. Constant aspiration was then performed at the tip of the intermediate catheter as the retriever was gently and slowly withdrawn with fluoroscopic observation. Once the retriever was entirely removed from  the system, free aspiration was confirmed at the hub of the intermediate catheter, with free blood return confirmed. Control angiogram was performed with TICI 3 flow. Control angiogram from the NeuronMax within the common carotid artery was performed, demonstrating occlusion at the stent site. 035 Rosen wire was advanced through the intermediate catheter to the skullbase, a rotating hemostatic valve was placed on the back end of the intermediate catheter, an the intermediate catheter was withdrawn slowly under fluoroscopic observation with gentle contrast injection to identified the distal site of the thrombus in the cervical segment of the internal carotid artery. This site was just beyond the stent at the bifurcation. Intermediate catheter was then advanced to the skullbase over the [employer], Rosen wire was removed. An the  exchange length Transcend wire was placed into the internal carotid artery. Intermediate catheter was withdrawn. A non tapered Acculink carotid stent 6 mm x 30mm was selected to match the distal 6 mm diameter of the previously placed stent. This stent was deployed under fluoroscopic road map, with slight distal migration, with the proximal stent margin just beyond the distal stent margin of the first stent. Delivery device was removed an a control angiogram was performed demonstrating hemodynamically significant stenosis at the short interval between the stents. A third stent was then deployed at the interval, with an Abbott Xact, 7mm x 20mm stent selected. Delivery device was removed. Control angiogram was performed at the cervical region confirming no flow limiting stenosis with excellent flow through the stent system. Control angiogram was performed of the intracranial vasculature demonstrating resolution of the flow limiting stenosis at the stent system with continued full reperfusion of the right hemisphere. NeuronMax was then removed. Diagnostic angiogram of the left carotid system was then performed using JB 1 catheter. All catheters wires were removed an and the 8 [place] by 55 cm bright tip sheath was exchanged for a short 8 [place] sheath. Control angiogram was performed at the right common femoral artery access site. Exoseal was deployed for hemostasis. Patient remained hemodynamically stable throughout. No complications were encountered and no significant blood loss encountered. FINDINGS: Ultrasound survey of the right inguinal region demonstrates widely patent common femoral artery with no significant plaque. Right common carotid artery: Initial angiogram of the right common carotid artery demonstrates normal course caliber and contour with no significant atherosclerotic changes. Right external carotid artery: Patent with antegrade flow. Right internal carotid artery: Occlusion of the right internal  carotid artery just beyond the bifurcation with no string sign. Angiogram after balloon angioplasty of the carotid occlusion demonstrates hemodynamically significant stenosis with irregular lumen and spiral Ing filling defect extending from the proximal ICA nearly to the skullbase. There is associated luminal thrombus in the distal cervical segment. After the initial balloon angioplasty, there is tubular tubular and spiraling filling defect of the cavernous segment extending through the supraclinoid segment to the carotid terminus. After treatment of the tandem occlusion and placement of stent system at the bifurcation extending into the right cervical ICA, there is adequate treatment of presumed dissection at the occlusion, as well as the associated luminal thrombus, with no hemodynamically significant stenosis and excellent flow through the stent system. After mechanical thrombectomy, the distal cervical ICA and the intracranial ICA to the carotid terminus demonstrate resolution of filling defect, which was presumed thrombus. Right MCA: Initial angiogram demonstrates proximal M1 occlusion with no flow. The distal MCA branches cross fill through anterior cerebral circulation and leptomeningeal collaterals. After the first pass with solitaire 4 x 40  and local aspiration there is TICI 2b flow established. Residual filling defect at the distal M1 extending into M2 branches, as well as residual filling defect in the carotid siphon. After the second pass with solitaire 4 x 40 and local aspiration there is complete restoration of flow within the right MCA compatible with TICI 3 flow. No residual filling defect in the carotid siphon. Before the stent system was completed at the carotid bifurcation, the intracranial injection demonstrated no flow within the right A1 segment, which was likely related to competing cross perfusion from the left. After stent system was completed at the carotid bifurcation, intracranial control  angiogram demonstrates antegrade flow through the carotid siphon, right MCA, right ACA, with maintained TICI 3 flow. Right ACA: Initial injection demonstrates patency of the right A1 segment, with perfusion of the right ACA territory. There is cross filling through patent anterior communicating artery, with some perfusion of the left ACA territory. Before the completion of the right carotid stent system, hemodynamically significant stenosis precluded adequate filling of the A1 segment secondary to competing cross flow from the left. After completion of the stent system, this flow phenomenon resolved, with complete filling of the right A1 and A2 branches. Left common carotid artery:  Normal course caliber and contour. Left ICA: Normal course caliber and contour with no significant atherosclerotic changes. Patent 81 segment and anterior communicating artery with cross flow to the right. No filling defect or intracranial stenosis. Left external carotid artery: Unremarkable course caliber and contour and remains patent. Right common femoral artery: Adequate puncture site of the common femoral artery. 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 Deployment of Exoseal for hemostasis. Signed, Yvone Neu. Loreta Ave DO [facility] Radiology PLAN: Patient is stable to CT department for head CT Admit to neuro ICU. First 24 hours target systolic blood pressure of 120-140. Right hip straight for 6 hours status post removal of 8 French sheath. Electronically Signed   By: Gilmer Mor D.O.   On: 04/01/2017 11:23   Ct Head Code Stroke W/o Cm  Result Date: 03/31/2017 CLINICAL DATA:  Code stroke. 76 year old male with left side  weakness. EXAM: CT HEAD WITHOUT CONTRAST TECHNIQUE: Contiguous axial images were obtained from the base of the skull through the vertex without intravenous contrast. COMPARISON:  Head CT without contrast 12/13/2009 FINDINGS: Brain: Hypodensity at the level of the right insula and external capsule (series 3, image 17). No acute intracranial hemorrhage identified. No midline shift, mass effect, or evidence of intracranial mass lesion. Elsewhere gray-white matter differentiation appears stable since 2011. No ventriculomegaly. Vascular: Hyperdense distal right MCA M1 segment. Skull: No acute osseous abnormality identified. Sinuses/Orbits: Chronic paranasal sinus mucosal thickening, mildly progressed. Tympanic cavities and mastoids are clear. Other: No acute orbit or scalp soft tissue findings. ASPECTS [facility] Stroke Program Early CT Score) - Ganglionic level infarction (caudate, lentiform nuclei, internal capsule, insula, M1-M3 cortex): 6 (minus 1 for right insula) - Supraganglionic infarction (M4-M6 cortex): 3 Total score (0-10 with 10 being normal): 9 IMPRESSION: 1. Emergent right MCA large vessel occlusion suspected. No associated hemorrhage or mass effect. 2. ASPECTS is 9. 3. The above was relayed via text pager to Dr. Ritta Slot 1052 hours. Electronically Signed   By: Rexene Edison  Margo Aye M.D.   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 EXAM: ULTRASOUND GUIDED ACCESS RIGHT COMMON FEMORAL ARTERY. CEREBRAL ANGIOGRAM. REVASCULARIZATION OF OCCLUDED RIGHT INTERNAL CAROTID ARTERY AT THE BIFURCATION WITH BALLOON ANGIOPLASTY AND EMERGENT STENTING MECHANICAL THROMBECTOMY RIGHT MCA OCCLUSION DEPLOYMENT OF CLOSURE DEVICE FOR HEMOSTASIS  COMPARISON:  CT 03/31/2017, CT angiogram 03/31/2017 MEDICATIONS: 2.0 g Ancef. The antibiotic was administered within 1 hour of the procedure ANESTHESIA/SEDATION: General anesthesia CONTRAST:  180 cc Isovue FLUOROSCOPY TIME:  Fluoroscopy Time: 44 minutes 18 seconds (3,731 mGy). COMPLICATIONS: None TECHNIQUE: Emergent informed written consent was obtained after discussion with the patient who confirmed he has no family available and no friends to speak on his behalf. Angiogram and thrombectomy was performed as the standard of care for a patient presentation such as his, after a discussion with the patient regarding risks and benefits. He did acknowledge wishing to proceed to be helped. Specific risks discussed include: Bleeding, infection, contrast reaction, kidney injury/failure, need for further procedure/surgery, arterial injury or dissection, embolization to new territory, intracranial hemorrhage (10-15% risk), neurologic deterioration, cardiopulmonary collapse, death. Maximal Sterile Barrier Technique was utilized including caps, mask, sterile gowns, sterile gloves, sterile drape, hand hygiene and skin antiseptic. A timeout was performed prior to the initiation of the procedure. PROCEDURE: Maximal Sterile Barrier Technique was utilized including during the procedure including caps, mask, sterile gowns, sterile gloves, sterile drape, hand hygiene and skin antiseptic. A timeout was performed prior to the initiation of the procedure. Ultrasound survey of the right inguinal region was performed with images stored and sent to PACs. A micropuncture needle was used access the right common femoral artery under ultrasound. With excellent arterial blood flow returned, an .018 micro wire was passed through the needle, observed to enter the abdominal aorta under fluoroscopy. The needle was removed, and a micropuncture sheath was placed over the wire. The inner dilator and wire were removed, and an 035 Bentson wire was  advanced under fluoroscopy into the abdominal aorta. The sheath was removed and a standard 5 [place] vascular sheath was placed. The dilator was removed and the sheath was flushed. A 67F JB-1 diagnostic catheter was advanced over the wire to the proximal descending thoracic aorta. Wire was then removed. Double flush of the catheter was performed. Catheter was then used to select the innominate artery. Angiogram was performed. Catheter was then advanced into the right common carotid artery. Formal angiogram was performed. Exchange length Rosen wire was then passed through the diagnostic catheter to the distal common carotid artery and the diagnostic catheter was removed. The 5 French sheath was removed and exchanged for 8 French 55 centimeter BrightTip sheath. Sheath was flushed and attached to pressurized and heparinized saline bag for constant forward flow. Then a NeuroMax 80cm catheter was then advanced over the wire, positioned into the distal common carotid artery. Copious back flush was performed and the balloon catheter was attached to heparinized and pressurized saline bag for forward flow. . With the tip of the neuro on max position in the distal common carotid artery, micro wire and microcatheter were selected in attempt to cross the lesion at the bifurcation. A soft tipped Transcend wire and rapid transit microcatheter were selected. After the wire was navigated across the  lesion, the microcatheter encountered significant resistance. The tension in the system caused the neuro on max catheter to buckle towards the aortic arch. A tandem wire using a roadrunner 035 wire was then placed adjacent to the microcatheter system with the tip of the roadrunner into the external carotid artery. With the buddy wire in place, the microcatheter was pushed across the lesion in the cervical carotid. After successfully crossing the lesion at the bifurcation, the wire and microcatheter were position in the distal cervical  segment of the ICA. The wire was removed and contrast was infused to confirm intraluminal location. Exchange length Transcend wire was then placed through the microcatheter terminating at the skullbase. The microcatheter was removed, and a 4 mm by 30 mm rapid exchange system Viatrak angioplasty balloon was selected for the initial dilation. Buddy wire remained in place for pushing the balloon across the lesion. Once the balloon was position at the lesion. Inflation to a full 8 atmospheric and nominal diameter was accomplished. A second balloon angioplasty was performed. Balloon was removed from the micro wire, and the roadrunner wire was removed. Angiogram was performed. Given the appearance of the angioplasty site and poor antegrade flow through the cervical carotid, we elected to place a stent prior to thrombectomy of the MCA. A tapered Abbott Xact 54mm/6mm x 40mm carotid stent was selected and deployed at the lesion. Repeat angiogram demonstrated improved flow with evidence of dissection continuing above the stented segment. Given there was adequate antegrade flow, we elected to proceed with mechanical thrombectomy. In intermediate CAT 6 catheter was then passed over the Transcend wire to the skullbase, with copious backflow performed. The hub of the rotating hemostatic valve was attached to pressurized heparinized saline back. Pro view 18 catheter was then selected and advanced over the exchange wire to the skullbase. The exchange wire was then removed and synchro soft micro wire was selected to navigate into the intracranial ICA. Angiogram through the intermediate catheter was performed at the skullbase, and the microcatheter and micro wire were advanced across the proximal M1 occlusion under roadmap angiogram. While the microcatheter was being advanced through the occlusion over the wire, the intermediate catheter was observed to travel retrograde in the cervical carotid. At this point, we recognized that the  neuro on max catheter had withdrawn into the aortic arch, and the entire system needed to be reduced in order to remove the redundancy of the telescoping system. Once the redundancy was removed, NeuronMax was positioned again in the distal common carotid artery, intermediate catheter at the skullbase, and the microcatheter beyond the M1 occlusion, the synchro soft was removed and small injection through the microcatheter confirmed intraluminal location within insular branch location. A rotating hemostatic valve was then attached to the back end of the microcatheter, and a pressurized and heparinized saline bag was attached to the catheter. 4 x 40 solitaire device was then selected. Back flush was achieved at the rotating hemostatic valve, and then the device was gently advanced through the microcatheter to the distal end. The retriever was then unsheathed by withdrawing the microcatheter under fluoroscopy. Once the retriever was completely unsheathed, control angiogram was performed from the intermediate catheter. 3 minute time clock was observed. Constant aspiration was then performed at the tip of the intermediate catheter as the retriever was gently and slowly withdrawn with fluoroscopic observation. Once the retriever was entirely removed from the system, free aspiration was confirmed at the hub of the intermediate catheter, with free blood return confirmed. Control angiogram was  then performed.  TICI 2b Second pass was then attempted. Microcatheter in the micro wire were then advanced through the intermediate catheter, navigated into the insular branches of the right MCA territory under roadmap angiogram. Once the microcatheter was passed distally into the selected branch, the wire was removed and aspiration at the hub of the macro catheter was performed to confirm return of blood. Gentle contrast injection confirmed intraluminal location within the selected insular branch. The same 4 x 40 solitaire device was  then used. Back flush was achieved at the rotating hemostatic valve on the microcatheter, and then the device was gently advanced through the microcatheter to the distal end. The retriever was then unsheathed by withdrawing the microcatheter under fluoroscopy. Once the retriever was completely unsheathed, control angiogram was performed from the intermediate catheter. 3 minute time clock was again observed. Constant aspiration was then performed at the tip of the intermediate catheter as the retriever was gently and slowly withdrawn with fluoroscopic observation. Once the retriever was entirely removed from the system, free aspiration was confirmed at the hub of the intermediate catheter, with free blood return confirmed. Control angiogram was performed with TICI 3 flow. Control angiogram from the NeuronMax within the common carotid artery was performed, demonstrating occlusion at the stent site. 035 Rosen wire was advanced through the intermediate catheter to the skullbase, a rotating hemostatic valve was placed on the back end of the intermediate catheter, an the intermediate catheter was withdrawn slowly under fluoroscopic observation with gentle contrast injection to identified the distal site of the thrombus in the cervical segment of the internal carotid artery. This site was just beyond the stent at the bifurcation. Intermediate catheter was then advanced to the skullbase over the [employer], Rosen wire was removed. An the exchange length Transcend wire was placed into the internal carotid artery. Intermediate catheter was withdrawn. A non tapered Acculink carotid stent 6 mm x 30mm was selected to match the distal 6 mm diameter of the previously placed stent. This stent was deployed under fluoroscopic road map, with slight distal migration, with the proximal stent margin just beyond the distal stent margin of the first stent. Delivery device was removed an a control angiogram was performed demonstrating  hemodynamically significant stenosis at the short interval between the stents. A third stent was then deployed at the interval, with an Abbott Xact, 7mm x 20mm stent selected. Delivery device was removed. Control angiogram was performed at the cervical region confirming no flow limiting stenosis with excellent flow through the stent system. Control angiogram was performed of the intracranial vasculature demonstrating resolution of the flow limiting stenosis at the stent system with continued full reperfusion of the right hemisphere. NeuronMax was then removed. Diagnostic angiogram of the left carotid system was then performed using JB 1 catheter. All catheters wires were removed an and the 8 [place] by 55 cm bright tip sheath was exchanged for a short 8 [place] sheath. Control angiogram was performed at the right common femoral artery access site. Exoseal was deployed for hemostasis. Patient remained hemodynamically stable throughout. No complications were encountered and no significant blood loss encountered. FINDINGS: Ultrasound survey of the right inguinal region demonstrates widely patent common femoral artery with no significant plaque. Right common carotid artery: Initial angiogram of the right common carotid artery demonstrates normal course caliber and contour with no significant atherosclerotic changes. Right external carotid artery: Patent with antegrade flow. Right internal carotid artery: Occlusion of the right internal carotid artery just beyond the bifurcation with  no string sign. Angiogram after balloon angioplasty of the carotid occlusion demonstrates hemodynamically significant stenosis with irregular lumen and spiral Ing filling defect extending from the proximal ICA nearly to the skullbase. There is associated luminal thrombus in the distal cervical segment. After the initial balloon angioplasty, there is tubular tubular and spiraling filling defect of the cavernous segment extending through the  supraclinoid segment to the carotid terminus. After treatment of the tandem occlusion and placement of stent system at the bifurcation extending into the right cervical ICA, there is adequate treatment of presumed dissection at the occlusion, as well as the associated luminal thrombus, with no hemodynamically significant stenosis and excellent flow through the stent system. After mechanical thrombectomy, the distal cervical ICA and the intracranial ICA to the carotid terminus demonstrate resolution of filling defect, which was presumed thrombus. Right MCA: Initial angiogram demonstrates proximal M1 occlusion with no flow. The distal MCA branches cross fill through anterior cerebral circulation and leptomeningeal collaterals. After the first pass with solitaire 4 x 40 and local aspiration there is TICI 2b flow established. Residual filling defect at the distal M1 extending into M2 branches, as well as residual filling defect in the carotid siphon. After the second pass with solitaire 4 x 40 and local aspiration there is complete restoration of flow within the right MCA compatible with TICI 3 flow. No residual filling defect in the carotid siphon. Before the stent system was completed at the carotid bifurcation, the intracranial injection demonstrated no flow within the right A1 segment, which was likely related to competing cross perfusion from the left. After stent system was completed at the carotid bifurcation, intracranial control angiogram demonstrates antegrade flow through the carotid siphon, right MCA, right ACA, with maintained TICI 3 flow. Right ACA: Initial injection demonstrates patency of the right A1 segment, with perfusion of the right ACA territory. There is cross filling through patent anterior communicating artery, with some perfusion of the left ACA territory. Before the completion of the right carotid stent system, hemodynamically significant stenosis precluded adequate filling of the A1 segment  secondary to competing cross flow from the left. After completion of the stent system, this flow phenomenon resolved, with complete filling of the right A1 and A2 branches. Left common carotid artery:  Normal course caliber and contour. Left ICA: Normal course caliber and contour with no significant atherosclerotic changes. Patent 81 segment and anterior communicating artery with cross flow to the right. No filling defect or intracranial stenosis. Left external carotid artery: Unremarkable course caliber and contour and remains patent. Right common femoral artery: Adequate puncture site of the common femoral artery. 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 Deployment of Exoseal for hemostasis. Signed, Yvone Neu. Loreta Ave DO [facility] Radiology PLAN: Patient is stable to CT department for head CT Admit to neuro ICU. First 24 hours target systolic blood pressure of 120-140. Right hip straight for 6 hours status post removal of 8 French sheath. Electronically Signed   By: Gilmer Mor D.O.   On: 04/01/2017 11:23    Labs:  CBC:  Recent Labs  03/31/17 1040 03/31/17 1044 04/01/17 0441 04/02/17 0330 04/03/17 0710  WBC 11.5*  --  12.1* 11.9* 16.1*  HGB 16.0 15.6 14.0 12.0* 13.3  HCT 46.3 46.0 39.9 35.5* 38.8*  PLT 168  --  154 122* 120*    COAGS:  Recent Labs  03/31/17 1040  INR 1.07  APTT 26    BMP:  Recent Labs  08/10/16 1324 03/31/17 1040 03/31/17 1044 04/01/17 0441 04/02/17 0055  NA 141 139 140 139 140  K 4.8 4.0 4.0 4.3 3.7  CL 102 105 105 110 111  CO2 28 21*  --  21* 22  GLUCOSE 112* 148* 151* 184* 164*  BUN 14 10 11 13 20   CALCIUM 9.9 9.1  --  7.7* 7.7*  CREATININE  1.16 1.25* 1.10 1.18 1.14  GFRNONAA  --  55*  --  59* >60  GFRAA  --  >60  --  >60 >60    LIVER FUNCTION TESTS:  Recent Labs  08/10/16 1324 03/31/17 1040  BILITOT 0.9 0.9  AST 27 31  ALT 42 29  ALKPHOS 71 61  PROT 7.8 6.8  ALBUMIN 4.6 4.0    Assessment and Plan: 1. CVA, s/p revascularization of R ICA with angioplasty and stent placement on 4/27, [place] [place] -patient is extubated, but speech is somewhat garbled and flighty.  He does know the year, but does not know where he is.  He remains left sided hemiparetic.   -defer further care to the neuro service.   -Please call us with any questions in the interim.    Electronically Signed: Letha Cape 04/03/2017, 10:40 AM   I spent a total of 15 Minutes at the the patient's bedside AND on the patient's hospital floor or unit, greater than 50% of which was counseling/coordinating care for CVA

## 2017-04-03 NOTE — Progress Notes (Signed)
STROKE TEAM PROGRESS NOTE   HISTORY OF PRESENT ILLNESS (per record) OCIEL RETHERFORD is a 76 y.o. male who was last in his normal state of health definitely yesterday evening when he interacted with someone at his facility. 4 AM, he got up and went to the bathroom and felt normal and that time. When he tried it up for more to the bathroom, however, he fell and was unable to get up. He was then unable to get to a phone for about 6 hours and once he finally did, he called 911 and was brought in as a code stroke.   LKW: 4/26 evening tpa given?: no, outside of window Modified Rankin Score: 0    SUBJECTIVE (INTERVAL HISTORY) No family members present. He was extubated y`day and is doing well. The patient is able to follow   commands.Speech therapy recommends NPO.Elevated WBC count but afebrile  OBJECTIVE Temp:  [98.1 F (36.7 C)-99.1 F (37.3 C)] 98.1 F (36.7 C) (04/30 1116) Pulse Rate:  [65-92] 67 (04/30 1100) Cardiac Rhythm: Normal sinus rhythm (04/30 0800) Resp:  [16-29] 21 (04/30 1100) BP: (95-135)/(51-79) 128/72 (04/30 1100) SpO2:  [88 %-96 %] 96 % (04/30 1100) Weight:  [187 lb 9.8 oz (85.1 kg)] 187 lb 9.8 oz (85.1 kg) (04/30 0500)  CBC:   Recent Labs Lab 04/02/17 0330 04/03/17 0710  WBC 11.9* 16.1*  NEUTROABS 9.7* 13.9*  HGB 12.0* 13.3  HCT 35.5* 38.8*  MCV 90.6 90.4  PLT 122* 120*    Basic Metabolic Panel:   Recent Labs Lab 04/01/17 0441 04/02/17 0055 04/02/17 0330 04/03/17 0710  NA 139 140  --   --   K 4.3 3.7  --   --   CL 110 111  --   --   CO2 21* 22  --   --   GLUCOSE 184* 164*  --   --   BUN 13 20  --   --   CREATININE 1.18 1.14  --   --   CALCIUM 7.7* 7.7*  --   --   MG 1.4*  --  2.2 1.8  PHOS 2.0*  --  1.9* 2.4*    Lipid Panel:     Component Value Date/Time   CHOL 148 04/01/2017 0442   TRIG 109 04/01/2017 0442   HDL 27 (L) 04/01/2017 0442   CHOLHDL 5.5 04/01/2017 0442   VLDL 22 04/01/2017 0442   LDLCALC 99 04/01/2017 0442   HgbA1c:   Lab Results  Component Value Date   HGBA1C 6.2 (H) 04/01/2017   Urine Drug Screen:     Component Value Date/Time   LABOPIA NONE DETECTED 12/13/2009 2102   COCAINSCRNUR NONE DETECTED 12/13/2009 2102   LABBENZ NONE DETECTED 12/13/2009 2102   AMPHETMU NONE DETECTED 12/13/2009 2102   THCU NONE DETECTED 12/13/2009 2102   LABBARB  12/13/2009 2102    NONE DETECTED        DRUG SCREEN FOR MEDICAL PURPOSES ONLY.  IF CONFIRMATION IS NEEDED FOR ANY PURPOSE, NOTIFY LAB WITHIN 5 DAYS.        LOWEST DETECTABLE LIMITS FOR URINE DRUG SCREEN Drug Class       Cutoff (ng/mL) Amphetamine      1000 Barbiturate      200 Benzodiazepine   200 Tricyclics       300 Opiates          300 Cocaine          300 THC  50    Alcohol Level     Component Value Date/Time   [facility]  12/13/2009 2140    <5        LOWEST DETECTABLE LIMIT FOR SERUM ALCOHOL IS 5 mg/dL FOR MEDICAL PURPOSES ONLY    IMAGING  Ct Angio Head W Or Wo Contrast Ct Angio Neck W Or Wo Contrast Ct Cerebral Perfusion W Contrast 03/31/2017 1. Positive for emergent large vessel occlusion, and favorable CT perfusion findings for endovascular re-perfusion.  2. Occlusion of the Right ICA just beyond its origin. Reconstitution of the Right ICA terminus beginning at the anterior genu (perhaps via the right ophthalmic artery). But superimposed Right MCA mid M1 segment occlusion.  3. CT perfusion indicates a core infarct of 40 mL in the Right MCA territory and right hemisphere penumbra of 146 mL.  4. The above was relayed to Dr. Ritta Slot beginning at 1059 hours.  5. Stenosis of the right ACA origin, but the right A1 segment may be non dominant. Right A2 and distal ACA enhancement is within normal limits. Normal left MCA and left ACA.  6. Aortic arch and bilateral carotid atherosclerosis without additional stenosis.  7. Mild left vertebral artery origin stenosis due to soft plaque. Otherwise negative posterior circulation.  8.  Calcified coronary artery atherosclerosis.   Ct Head Wo Contrast 04/01/2017 Developing bland and hemorrhagic infarction in the RIGHT hemisphere corresponding roughly to the area of infarcted core of brain on recent CT perfusion.    Ct Head Wo Contrast 03/31/2017 1. Extensive areas of hyperdensity involving the basal ganglia and right MCA territory cortex likely reflecting luxury perfusion associated with the areas of acute infarction following revascularization. Hemorrhage is not entirely excluded, but felt to be a minority of the hyperdensity visualized. The areas largely correspond to the areas of core infarction on the CTP study.  2. A tiny amount of subarachnoid hemorrhage may be present without intraventricular hemorrhage.      Ir US Guide Vasc Access Right 04/01/2017 IMPRESSION: Status post cerebral angiogram and treatment of tandem occlusion of the right ICA and right M1, with emergent stent system of the right ICA for treatment of dissection and intraluminal thrombus, and mechanical thrombectomy of the right MCA and carotid siphon, with restoration of TICI 3 flow. Deployment of Exoseal for hemostasis. Signed, Yvone Neu. Loreta Ave DO [facility] Radiology PLAN: Patient is stable to CT department for head CT Admit to neuro ICU. First 24 hours target systolic blood pressure of 120-140. Right hip straight for 6 hours status post removal of 8 French sheath. Electronically Signed   By: Gilmer Mor D.O.   On: 04/01/2017 11:23     Dg Chest Port 1 View 04/01/2017 Early bibasilar atelectasis similar to priors.   Dg Chest Port 1 View 03/31/2017 1. Unremarkable positioning of endotracheal and orogastric tubes. 2. Low volumes with mild atelectasis.     Ct Head Code Stroke W/o Cm 03/31/2017 1. Emergent right MCA large vessel occlusion suspected. No associated hemorrhage or mass effect.  2. ASPECTS is 9.    PHYSICAL EXAM Elderly Caucasian male   .  Marland Kitchen Afebrile. Head is nontraumatic. Neck is supple without bruit.    Cardiac exam no murmur or gallop. Lungs are clear to auscultation. Distal pulses are well felt.  Neurological Exam :  drowsy Opens eyes and follows midline and commands on right side  .Marland Kitchen Left gaze preference but able to look to right. Blinks to threat bilaterally. Fundi not  visualized.left lower face weakness. Tongue midline. Left hemiplegia but able to withdraw to pain lower extremity more than upper extremity. Purposeful antigravity movements on the right side. Left plantar upgoing right downgoing. ASSESSMENT/PLAN Mr. TYREESE THAIN is a 76 y.o. male with no significant past medical history presenting with left hemiparesis. He did not receive IV t-PA due to late presentation. Status post IR thrombectomy and stenting as above.  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 Superimposed Right MCA mid M1 segment occlusion  Carotid Doppler - CTA neck 2D Echo - Normal LV function; trace MR and TR; mildly elevated pulmonary    pressure.  LDL - 99  HgbA1c 6.2   VTE prophylaxis - SCDs  Diet NPO time specified  No antithrombotic prior to admission, now on aspirin 81 mg daily (Plavix discontinued secondary to hemorrhage)  Patient will be counseled to be compliant with his antithrombotic medications  Ongoing aggressive stroke risk factor management  Therapy recommendations: pending  Disposition: Pending  Hypertension  Blood pressure tends to run low  Permissive hypertension (OK if < 220/120) but gradually normalize in 5-7 days  Long-term BP goal normotensive  Hyperlipidemia  Home meds: No lipid lowering medications prior to admission  LDL 99, goal < 70  Start Lipitor 40 mg daily when access is available  Continue statin at  discharge    Other Stroke Risk Factors  Advanced age  The patient quit smoking 21 years ago.  Obesity, Body mass index is 29.45 kg/m., recommend weight loss, diet and exercise as appropriate   Family hx stroke (mother)    Other Active Problems  Hemorrhagic conversion -> discontinue Plavix - repeat head CT in a.m. - await MRI.  Electrolyte abnormalities - recheck in a.m.  Hyperglycemia - await hemoglobin A1c  Hospital day # 3  I have personally examined this patient, reviewed notes, independently viewed imaging studies, participated in medical decision making and plan of care.ROS completed by me personally and pertinent positives fully documented  I have made any additions or clarifications directly to the above note.  He presented with left hemiplegia with tandem occlusions of right internal carotid and middle cerebral artery and underwent emergent recanalization with RICA rescue stent .Plan : Continue aspirin but hold plavix till CT shows isodense blood in few days.   Mobilize out of bed and therapy and rehab consults. ST to revealuate swallow. D/W Speech therapy. D/W Dr Orlene Och.No family available for discussion. Transfer to stroke unit floor bed.  This patient is critically ill and at significant risk of neurological worsening, death and care requires constant monitoring of vital signs, hemodynamics,respiratory and cardiac monitoring, extensive review of multiple databases, frequent neurological assessment, discussion with family, other specialists and medical decision making of high complexity.I have made any additions or clarifications directly to the above note.This critical care time does not reflect procedure time, or teaching time or supervisory time of PA/NP/Med Resident etc but could involve care discussion time.  I spent 30 minutes of neurocritical care time  in the care of  this patient.    Delia Heady, MD Medical Director [facility] St[facility] Pager:  [phone] 04/03/2017 12:49 PM    To contact Stroke Continuity provider, please refer to [email or web address]. After hours, contact General Neurology

## 2017-04-03 NOTE — Evaluation (Signed)
Clinical/Bedside Swallow Evaluation Patient Details  Name: Matthew Meza MRN: [id number] Date of Birth: 09/05/1941  Today's Date: 04/03/2017 Time: SLP Start Time (ACUTE ONLY): 0902 SLP Stop Time (ACUTE ONLY): 0916 SLP Time Calculation (min) (ACUTE ONLY): 14 min  Past Medical History:  Past Medical History:  Diagnosis Date  . No pertinent past medical history    Past Surgical History:  Past Surgical History:  Procedure Laterality Date  . APPENDECTOMY    . IR ANGIO INTRA EXTRACRAN SEL COM CAROTID INNOMINATE UNI L MOD SED  03/31/2017  . IR INTRAVSC STENT CERV CAROTID W/O EMB-PROT MOD SED INC ANGIO  03/31/2017  . IR PERCUTANEOUS ART THROMBECTOMY/INFUSION INTRACRANIAL INC DIAG ANGIO  03/31/2017  . IR US GUIDE VASC ACCESS RIGHT  03/31/2017  . LAPAROSCOPIC APPENDECTOMY  06/21/2012   Procedure: APPENDECTOMY LAPAROSCOPIC;  Surgeon: Kandis Cocking, MD;  Location: [facility] ORS;  Service: General;  Laterality: N/A;  . NO PAST SURGERIES     HPI:  Matthew J Bramlettis a 76 y.o.malewith no significant past medical historypresenting with left hemiparesis.Hedidnot receive IV t-PA due to late presentation. Dx with hemorrhagic right MCA infarct. Status post IR thrombectomy and stenting 4/27, remained intubated post procedure. Extubated 4/29.   Assessment / Plan / Recommendation Clinical Impression  Bedside swallow evaluation complete. Patient presents with evidence of decreased airway protection at baseline with secretions as well as post intake of ice chips/thin liquids characterized by wet vocal quality and strong cough response. Suspect a combination of neuro deficits s/p right MCA CVA and lethargy as patient with difficulty maintaining appropriate level of arousal. Recommend NPO. SLP will f/u in am 5/1 for readiness for instrumental testing once alertness improves. SLP Visit Diagnosis: Cognitive communication deficit (R41.841);Dysphagia, oropharyngeal phase (R13.12)    Aspiration Risk  Severe  aspiration risk    Diet Recommendation NPO   Medication Administration: Via alternative means    Other  Recommendations Oral Care Recommendations: Oral care QID   Follow up Recommendations Inpatient Rehab      Frequency and Duration min 2x/week  2 weeks       Prognosis Prognosis for Safe Diet Advancement: Good      Swallow Study   General HPI: Matthew J Bramlettis a 76 y.o.malewith no significant past medical historypresenting with left hemiparesis.Hedidnot receive IV t-PA due to late presentation. Dx with hemorrhagic right MCA infarct. Status post IR thrombectomy and stenting 4/27, remained intubated post procedure. Extubated 4/29. Type of Study: Bedside Swallow Evaluation Previous Swallow Assessment: none Diet Prior to this Study: NPO Temperature Spikes Noted: No Respiratory Status: Room air History of Recent Intubation: Yes Length of Intubations (days): 2 days Date extubated: 04/02/17 Behavior/Cognition: Lethargic/Drowsy;Confused Oral Cavity Assessment: Dry;Dried secretions Oral Care Completed by SLP: Yes Oral Cavity - Dentition: Adequate natural dentition Vision: Functional for self-feeding Self-Feeding Abilities: Needs assist Patient Positioning: Upright in bed Baseline Vocal Quality: Breathy;Low vocal intensity Volitional Cough: Weak;Wet Volitional Swallow: Able to elicit    Oral/Motor/Sensory Function Overall Oral Motor/Sensory Function: Moderate impairment Facial ROM: Reduced left;Suspected CN VII (facial) dysfunction Facial Symmetry: Abnormal symmetry left;Suspected CN VII (facial) dysfunction Facial Strength: Reduced left;Suspected CN VII (facial) dysfunction Facial Sensation: Within Functional Limits Lingual ROM: Reduced left;Suspected CN XII (hypoglossal) dysfunction Lingual Symmetry: Abnormal symmetry left;Suspected CN XII (hypoglossal) dysfunction Lingual Strength: Reduced;Suspected CN XII (hypoglossal) dysfunction Lingual Sensation: Within  Functional Limits Velum: Within Functional Limits Mandible: Within Functional Limits   Ice Chips Ice chips: Impaired Presentation: Spoon Pharyngeal Phase Impairments: Decreased hyoid-laryngeal movement;Multiple swallows;Wet Vocal  Quality   Thin Liquid Thin Liquid: Impaired Presentation: Cup;Self Fed (HOH assist) Oral Phase Impairments: Reduced labial seal Oral Phase Functional Implications: Left anterior spillage Pharyngeal  Phase Impairments: Multiple swallows;Wet Vocal Quality;Cough - Immediate;Decreased hyoid-laryngeal movement;Suspected delayed Swallow    Nectar Thick Nectar Thick Liquid: Not tested   Honey Thick Honey Thick Liquid: Not tested   Puree Puree: Not tested   Solid   GO   Solid: Not tested       Ferdinand Lango MA, CCC-SLP [phone]  Chun Sellen Meryl 04/03/2017,9:27 AM

## 2017-04-03 NOTE — Progress Notes (Signed)
Occupational Therapy Evaluation Patient Details Name: Matthew Meza MRN: [id number] DOB: [birth year] Today's Date: 04/03/2017    History of Present Illness 76 yo admitted with Rt MCA CVA  emergent stent system of the right ICA for treatment of dissection and intraluminal thrombus, and mechanical thrombectomy of the right MCA and carotid siphon, VDRF 4/27-4/29. No significant PMHx    Clinical Impression   PTA, pt apparently lived in a motel and was independent with ADL and mobility. Pt presents with significant functional deficits requiring Max A +2 for stand pivot transfer and max to total A with ADL tasks due to deficits listed below. Pt with apparent L neglect and apparent cognitive deficits affecting his insight/judgement. VSS on 6L throughout session. Pt will require extensive therapy to maximize functional level of independence. At this time, recommend SNF for rehab. Will follow acutely to address established goals and facilitate DC to next venue of care.     Follow Up Recommendations  SNF;Supervision/Assistance - 24 hour    Equipment Recommendations  Other (comment) (TBA at SNF)    Recommendations for Other Services       Precautions / Restrictions Precautions Precautions: Fall Precaution Comments: left hemiplegia, neglect Restrictions Weight Bearing Restrictions: No      Mobility Bed Mobility Overal bed mobility: Needs Assistance Bed Mobility: Rolling;Sidelying to Sit Rolling: Mod assist Sidelying to sit: Mod assist;+2 for physical assistance;+2 for safety/equipment;HOB elevated       General bed mobility comments: cueing and facilitation to roll left with assist grasp rail, bend knee, look to rail and rotate pelvis  Transfers Overall transfer level: Needs assistance   Transfers: Sit to/from Stand;Squat Pivot Transfers Sit to Stand: Mod assist;+2 physical assistance   Squat pivot transfers: Max assist;+2 safety/equipment;+2 physical assistance     General  transfer comment: pt stood x 3 from bed with left knee blocked, assist for rise, anterior translation and stabilization in standing with pt with left lean and flexed trunk. Pivot with +3 assist to physically assist and advance RLE to pivot toward chair with pt maintaining left lean    Balance Overall balance assessment: Needs assistance Sitting-balance support: Bilateral upper extremity supported Sitting balance-Leahy Scale: Poor Sitting balance - Comments: left lean with cues and assist of RUE on rail to maintain sitting with physical support min - max assist EOB grossly 6 min Postural control: Left lateral lean   Standing balance-Leahy Scale: Zero Standing balance comment: left lean and pt able to shift pelvis right with cueing and assist but unable to fully unweight either leg with assist and weight shift                           ADL either performed or assessed with clinical judgement   ADL Overall ADL's : Needs assistance/impaired Eating/Feeding: NPO   Grooming: Maximal assistance Grooming Details (indicate cue type and reason): unable to identify deoderant; states he wears glasses; able to wash his face with set up. Poor spatial orientation when reaching Upper Body Bathing: Maximal assistance;Sitting Upper Body Bathing Details (indicate cue type and reason): supported sitting. Unable to use RUE without falling to left Lower Body Bathing: Maximal assistance;Sit to/from stand   Upper Body Dressing : Total assistance   Lower Body Dressing: Total assistance   Toilet Transfer: +2 for physical assistance;Maximal assistance Toilet Transfer Details (indicate cue type and reason): simulated to recliner Toileting- Clothing Manipulation and Hygiene: Total assistance Toileting - Clothing Manipulation Details (indicate cue type  and reason): foley     Functional mobility during ADLs: +2 for physical assistance;Maximal assistance;Cueing for safety;Cueing for sequencing        Vision Baseline Vision/History: Wears glasses Wears Glasses: At all times Vision Assessment?: Vision impaired- to be further tested in functional context Additional Comments: Apparent L visual field cut. Will look into L visual field with max VC; R gaze preference; Will further assess     Perception Perception Perception Tested?: Yes Perception Deficits: Inattention/neglect;Spatial orientation Inattention/Neglect: Does not attend to left visual field;Does not attend to left side of body Spatial deficits: Misjudging distances when reaching. Able to identify L arm/leg when touching. Did not initiate any movement L U/LE; Gaze R but would attend at midline   Praxis Praxis Praxis tested?: Deficits Deficits: Initiation;Motor Impersistence    Pertinent Vitals/Pain Pain Assessment: Faces Faces Pain Scale: No hurt     Hand Dominance  (unsure)   Extremity/Trunk Assessment Upper Extremity Assessment Upper Extremity Assessment: LUE deficits/detail LUE Deficits / Details: Brunstrom Stage II arm (flexor synergy with yawning) no active movement observed.    Lower Extremity Assessment Lower Extremity Assessment: LLE deficits/detail;Defer to PT evaluation LLE Deficits / Details: trace 1/5 hip rotation with cueing, pt able to state light touch accurately, no hip flexion, knee flexion or dorsiflexion on command. Trace quad activation in standing   Cervical / Trunk Assessment Cervical / Trunk Assessment: Other exceptions (L bias; Difficulty maintaining midline postural control)   Communication Communication Communication: Other (comment);Expressive difficulties (slurred speech)   Cognition Arousal/Alertness: Lethargic (closing eyes frequently) Behavior During Therapy: Impulsive Overall Cognitive Status: Impaired/Different from baseline Area of Impairment: Safety/judgement;Awareness;Orientation;Attention;Problem solving                 Orientation Level: Disoriented  to;Time Current Attention Level: Sustained     Safety/Judgement: Decreased awareness of deficits;Decreased awareness of safety Awareness: Intellectual Problem Solving: Slow processing;Difficulty sequencing;Requires verbal cues;Requires tactile cues General Comments: pt with left neglect and inattention, stating he can walk and that he walked yesterday   General Comments       Exercises Exercises: Other exercises Other Exercises Other Exercises: LUE PROM   Shoulder Instructions      Home Living Family/patient expects to be discharged to:: Private residence [facility]") Living Arrangements: Alone Available Help at Discharge: Other (Comment) (unsure) Type of Home: Other(Comment) (motel) Home Access: Level entry     Home Layout: One level     Bathroom Shower/Tub: Chief Strategy Officer: Standard                Prior Functioning/Environment Level of Independence: Independent        Comments: pt reports no family or available assist at D/C        OT Problem List: Decreased strength;Decreased range of motion;Decreased activity tolerance;Impaired balance (sitting and/or standing);Impaired vision/perception;Decreased coordination;Decreased cognition;Decreased safety awareness;Decreased knowledge of use of DME or AE;Decreased knowledge of precautions;Cardiopulmonary status limiting activity;Impaired sensation;Impaired tone;Obesity;Impaired UE functional use      OT Treatment/Interventions: Self-care/ADL training;Therapeutic exercise;Neuromuscular education;DME and/or AE instruction;Therapeutic activities;Cognitive remediation/compensation;Visual/perceptual remediation/compensation;Patient/family education;Balance training    OT Goals(Current goals can be found in the care plan section) Acute Rehab OT Goals Patient Stated Goal: walk and put my pants on  OT Goal Formulation: Patient unable to participate in goal setting Time For Goal Achievement:  04/17/17 Potential to Achieve Goals: Good  OT Frequency: Min 2X/week   Barriers to D/C: Decreased caregiver support  pt lives in [place]  Co-evaluation PT/OT/SLP Co-Evaluation/Treatment: Yes Reason for Co-Treatment: Complexity of the patient's impairments (multi-system involvement);Necessary to address cognition/behavior during functional activity;For patient/therapist safety;To address functional/ADL transfers PT goals addressed during session: Mobility/safety with mobility;Balance OT goals addressed during session: ADL's and self-care      AM-PAC PT "6 Clicks" Daily Activity     Outcome Measure Help from another person eating meals?: Total Help from another person taking care of personal grooming?: A Lot Help from another person toileting, which includes using toliet, bedpan, or urinal?: Total Help from another person bathing (including washing, rinsing, drying)?: Total Help from another person to put on and taking off regular upper body clothing?: Total Help from another person to put on and taking off regular lower body clothing?: Total 6 Click Score: 7   End of Session Equipment Utilized During Treatment: Gait belt;Oxygen Nurse Communication: Mobility status;Need for lift equipment (use skylift)  Activity Tolerance: Patient tolerated treatment well Patient left: in chair;with call bell/phone within reach;with chair alarm set  OT Visit Diagnosis: Other abnormalities of gait and mobility (R26.89);Muscle weakness (generalized) (M62.81);Low vision, both eyes (H54.2);Other symptoms and signs involving cognitive function;Cognitive communication deficit (R41.841);Hemiplegia and hemiparesis Symptoms and signs involving cognitive functions: Cerebral infarction Hemiplegia - Right/Left: Left Hemiplegia - dominant/non-dominant: Non-Dominant Hemiplegia - caused by: Cerebral infarction                Time: 1610-9604 OT Time Calculation (min): 30 min Charges:  OT General Charges $OT  Visit: 1 Procedure OT Evaluation $OT Eval High Complexity: 1 Procedure G-Codes:     Zita Ozimek, OT/L  [phone] 04/03/2017  Rhema Boyett,HILLARY 04/03/2017, 1:40 PM

## 2017-04-03 NOTE — Evaluation (Signed)
Speech Language Pathology Evaluation Patient Details Name: Matthew Meza MRN: [id number] DOB: 06-24-1941 Today's Date: 04/03/2017 Time: 4098-1191 SLP Time Calculation (min) (ACUTE ONLY): 12 min  Problem List:  Patient Active Problem List   Diagnosis Date Noted  . Electrolyte imbalance 04/01/2017  . 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 Acute respiratory failure (HCC)   . Essential hypertension   . Acute encephalopathy   . Seasonal allergic rhinitis 10/30/2012   Past Medical History:  Past Medical History:  Diagnosis Date  . No pertinent past medical history    Past Surgical History:  Past Surgical History:  Procedure Laterality Date  . APPENDECTOMY    . IR ANGIO INTRA EXTRACRAN SEL COM CAROTID INNOMINATE UNI L MOD SED  03/31/2017  . IR INTRAVSC STENT CERV CAROTID W/O EMB-PROT MOD SED INC ANGIO  03/31/2017  . IR PERCUTANEOUS ART THROMBECTOMY/INFUSION INTRACRANIAL INC DIAG ANGIO  03/31/2017  . IR US GUIDE VASC ACCESS RIGHT  03/31/2017  . LAPAROSCOPIC APPENDECTOMY  06/21/2012   Procedure: APPENDECTOMY LAPAROSCOPIC;  Surgeon: Kandis Cocking, MD;  Location: [facility];  Service: General;  Laterality: N/A;  . NO PAST SURGERIES     HPI:  Mr.Matthew Meza a 76 y.o.malewith no significant past medical historypresenting with left hemiparesis.Hedidnot receive IV t-PA due to late presentation. Dx with hemorrhagic right MCA infarct. Status post IR thrombectomy and stenting 4/27, remained intubated post procedure. Extubated 4/29.   Assessment / Plan / Recommendation Clinical Impression  Patient presents with moderate dysarthria and moderate cognitive deficits impacting sustained attention, intellectual awareness, and problem solving s/p right MCA CVA. Patient will benefit from SLP f/u to address above deficits, maximizing ability to communicate needs/wants and independence with ADLs. CIR consult recommended.     SLP Assessment  SLP Recommendation/Assessment: Patient needs  continued Speech Lanaguage Pathology Services SLP Visit Diagnosis: Cognitive communication deficit (R41.841)    Follow Up Recommendations  Inpatient Rehab    Frequency and Duration min 2x/week  2 weeks      SLP Evaluation Cognition  Overall Cognitive Status: Impaired/Different from baseline Arousal/Alertness: Lethargic Orientation Level: Oriented to person;Oriented to situation;Disoriented to place;Disoriented to time Attention: Sustained Sustained Attention: Impaired Sustained Attention Impairment: Verbal basic;Functional basic Memory: Impaired Memory Impairment: Storage deficit;Retrieval deficit;Decreased recall of new information Awareness: Impaired Awareness Impairment: Intellectual impairment Problem Solving: Impaired Problem Solving Impairment: Verbal complex;Functional basic Safety/Judgment: Impaired Comments: left sided inattention vs visual field cut       Comprehension  Auditory Comprehension Overall Auditory Comprehension: Appears within functional limits for tasks assessed Visual Recognition/Discrimination Discrimination: Not tested Reading Comprehension Reading Status: Not tested    Expression Expression Primary Mode of Expression: Verbal Verbal Expression Overall Verbal Expression: Appears within functional limits for tasks assessed   Oral / Motor  Oral Motor/Sensory Function Overall Oral Motor/Sensory Function: Moderate impairment Facial ROM: Reduced left;Suspected CN VII (facial) dysfunction Facial Symmetry: Abnormal symmetry left;Suspected CN VII (facial) dysfunction Facial Strength: Reduced left;Suspected CN VII (facial) dysfunction Facial Sensation: Within Functional Limits Lingual ROM: Reduced left;Suspected CN XII (hypoglossal) dysfunction Lingual Symmetry: Abnormal symmetry left;Suspected CN XII (hypoglossal) dysfunction Lingual Strength: Reduced;Suspected CN XII (hypoglossal) dysfunction Lingual Sensation: Within Functional Limits Velum:  Within Functional Limits Mandible: Within Functional Limits Motor Speech Overall Motor Speech: Impaired Respiration: Impaired Level of Impairment: Word Phonation: Low vocal intensity;Breathy Resonance: Within functional limits Articulation: Impaired Level of Impairment: Word Intelligibility: Intelligibility reduced Word: 25-49% accurate Phrase: 0-24% accurate Sentence: 0-24% accurate Conversation: 0-24% accurate Motor Planning: Witnin functional limits Interfering Components:  (  lethargy) Effective Techniques: Increased vocal intensity;Over-articulate   GO            Ferdinand Lango MA, CCC-SLP [phone]          Ferdinand Lango Meryl 04/03/2017, 9:23 AM

## 2017-04-04 ENCOUNTER — Inpatient Hospital Stay (HOSPITAL_COMMUNITY): Payer: Medicare HMO

## 2017-04-04 LAB — CBC WITH DIFFERENTIAL/PLATELET
BASOS ABS: 0 10*3/uL (ref 0.0–0.1)
Basophils Relative: 0 %
EOS ABS: 0.2 10*3/uL (ref 0.0–0.7)
EOS PCT: 2 %
HCT: 35.8 % — ABNORMAL LOW (ref 39.0–52.0)
HEMOGLOBIN: 12.2 g/dL — AB (ref 13.0–17.0)
Lymphocytes Relative: 12 %
Lymphs Abs: 1.1 10*3/uL (ref 0.7–4.0)
MCH: 30.7 pg (ref 26.0–34.0)
MCHC: 34.1 g/dL (ref 30.0–36.0)
MCV: 90.2 fL (ref 78.0–100.0)
Monocytes Absolute: 0.7 10*3/uL (ref 0.1–1.0)
Monocytes Relative: 8 %
NEUTROS PCT: 78 %
Neutro Abs: 7.1 10*3/uL (ref 1.7–7.7)
Platelets: 114 10*3/uL — ABNORMAL LOW (ref 150–400)
RBC: 3.97 MIL/uL — AB (ref 4.22–5.81)
RDW: 12.7 % (ref 11.5–15.5)
WBC: 9.1 10*3/uL (ref 4.0–10.5)

## 2017-04-04 LAB — PHOSPHORUS: PHOSPHORUS: 1.8 mg/dL — AB (ref 2.5–4.6)

## 2017-04-04 LAB — MAGNESIUM: Magnesium: 2 mg/dL (ref 1.7–2.4)

## 2017-04-04 MED ORDER — SODIUM CHLORIDE 0.9 % IV SOLN
12.5000 mg | Freq: Three times a day (TID) | INTRAVENOUS | Status: DC | PRN
  Administered 2017-04-04 – 2017-04-10 (×9): 12.5 mg via INTRAVENOUS
  Filled 2017-04-04 (×10): qty 0.5

## 2017-04-04 NOTE — Care Management Important Message (Signed)
Important Message  Patient Details  Name: Matthew Meza MRN: [id number] Date of Birth: 1941-05-06   Medicare Important Message Given:  Yes    [name] 04/04/2017, 1:39 PM

## 2017-04-04 NOTE — Progress Notes (Signed)
Patient overnight was incontinent over night urinating at least twice starting at a little after 0000, his cognition is about the same throughout the night and his left side remains flaccid.

## 2017-04-04 NOTE — Clinical Social Work Note (Signed)
CSW has provided pt's daughter with bed offers at this time. CSW will initiate insurance authorization when a decision has been made.   [place], [place] [phone]

## 2017-04-04 NOTE — Progress Notes (Signed)
Modified Barium Swallow Progress Note  Patient Details  Name: Matthew Meza MRN: [id number] Date of Birth: 04-Sep-1941  Today's Date: 04/04/2017  Modified Barium Swallow completed.  Full report located under Chart Review in the Imaging Section.  Brief recommendations include the following:  Clinical Impression  Pt presents with moderately severe oropharyngeal dysphagia with sensorimotor deficits. Pt presents with decreased oral control resulting in delayed oral transiting and decreased bolus cohesion with compromise propulsion. Pharyngeal swallow characterized by hypomotility allowing severe residuals without awareness and laryngeal penetration of thin and nectar to vocal folds. Various postures including head turn left, chin tuck, both combined, cues for effortful swallow did not fully clear residuals. Pt's cough is weak at this time which will decrease airway protection with po. Fortunately, pt can swallow on command and is motivated to improve.  Recommend NPO except single SMALL ice chips allowed to fully melt. If goal is to discharge hospital within the next few days, pt may benefit from consideration for longer term alternative means of nutrition as his level of dysphagia currently prevents adequate po intake. Using teach back, live video, educated pt to findings and goals of improved vallecular clearance (via "hock") and strengthening cough/voice to improve swallow function/airway protection.    Swallow Evaluation Recommendations       SLP Diet Recommendations: NPO;Ice chips PRN after oral care (trials of po with slp of puree/honey/tsps water)       Medication Administration: Via alternative means       Compensations: Slow rate;Small sips/bites;Follow solids with liquid;Other (Comment);Effortful swallow (with po with slp only -"hock")= PO ONLY WITH SLP EXCEPT SINGLE ICE CHIPS   Postural Changes: Seated upright at 90 degrees   Oral Care Recommendations: Oral care QID   Other Recommendations: Have oral suction available   Donavan Burnet, MS [facility] SLP [phone]

## 2017-04-04 NOTE — Progress Notes (Signed)
MBS completed, full report to follow.  Pt presents with moderately severe oropharyngeal dysphagia with sensorimotor deficits.  Pt presents with decreased oral control resulting in delayed oral transiting and decreased bolus cohesion with compromise propulsion.  Pharyngeal swallow characterized by hypomotility allowing severe residuals without awareness and laryngeal penetration of thin and nectar to vocal folds.  Various postures including head turn left, chin tuck, both combined, cues for effortful swallow did not fully clear residuals.  Pt's cough is weak at this time which will decrease airway protection with po.  Fortunately, pt can swallow on command and is motivated to improve.   Recommend NPO except single SMALL ice chips allowed to fully melt.  If goal is to discharge hospital within the next few days, pt may benefit from consideration for longer term alternative means of nutrition as his level of dysphagia currently prevents adequate po intake.  Using teach back, live video, educated pt to findings and goals of improved vallecular clearance (via "hock") and strengthening cough/voice to improve swallow function/airway protection.             Donavan Burnet, MS [facility] SLP [phone]

## 2017-04-04 NOTE — Care Management Note (Signed)
Case Management Note  Patient Details  Name: Matthew Meza MRN: [id number] Date of Birth: Jun 13, 1941  Subjective/Objective:    Pt admitted with CVA. He is from home alone.                 Action/Plan: Plan is for SNF when patient is medically stable. CM following.  Expected Discharge Date:                  Expected Discharge Plan:  Skilled Nursing Facility  In-House Referral:  Clinical Social Work  Discharge planning Services     Post Acute Care Choice:    Choice offered to:     DME Arranged:    DME Agency:     HH Arranged:    HH Agency:     Status of Service:  In process, will continue to follow  If discussed at Long Length of Stay Meetings, dates discussed:    Additional Comments:  Kermit Balo, RN 04/04/2017, 2:22 PM

## 2017-04-04 NOTE — Progress Notes (Signed)
Physical Therapy Treatment Patient Details Name: Matthew Meza MRN: [id number] DOB: [birth year] Today's Date: 04/04/2017    History of Present Illness 76 yo admitted with Rt MCA CVA  emergent stent system of the right ICA for treatment of dissection and intraluminal thrombus, and mechanical thrombectomy of the right MCA and carotid siphon, VDRF 4/27-4/29. No significant PMHx     PT Comments    Pt presents with L UE and LE weakness this session, requiring +2 max assist for bed mobility, sit to stand, and squat pivot to chair.  He tolerated sitting balance ~15 minutes with min-mod assist and heavy multimodal cueing, and standing trial ~5 minutes requiring max +2 assist.  Current recommendations remain appropriate, as he will benefit from continued skilled therapy acutely and upon D/C.  Follow Up Recommendations  SNF;Supervision/Assistance - 24 hour     Equipment Recommendations  Hospital bed (TBD by next venue of care)    Recommendations for Other Services       Precautions / Restrictions Precautions Precautions: Fall Precaution Comments: left hemiplegia, neglect Restrictions Weight Bearing Restrictions: No    Mobility  Bed Mobility Overal bed mobility: Needs Assistance Bed Mobility: Supine to Sit     Supine to sit: Max assist;+2 for physical assistance;HOB elevated     General bed mobility comments: Max cueing required for pt to move R LE off bed.  Pt asked for help with L LE.  Max +2 assist required to elevate trunk and bed pad used to scoot pt's hips to EOB.  Transfers Overall transfer level: Needs assistance Equipment used: 2 person hand held assist Transfers: Sit to/from [employer] Sit to Stand: Max assist;+2 physical assistance   Squat pivot transfers: Max assist;+2 physical assistance;+2 safety/equipment     General transfer comment: Pt required +2 assist to stand with L knee blocked and facilitating at R hip and chest to achieve upright  position.  Pt with strong L lateral lean.  Heavy cues required to lift chest and pick up head.    Ambulation/Gait             General Gait Details: not attempted   Stairs            Wheelchair Mobility    Modified Rankin (Stroke Patients Only) Modified Rankin (Stroke Patients Only) Modified Rankin: Severe disability     Balance Overall balance assessment: Needs assistance Sitting-balance support: Single extremity supported;Feet supported Sitting balance-Leahy Scale: Poor (Zero at worst, poor at best with max cueing to use R UE) Sitting balance - Comments: Strong posterior/left lean with cues and assist of RUE on rail to maintain sitting with physical support min - max assist EOB.  Chair placed in front of pt and pt cued to lead forward with both UE's on chair back.  Postural control: Posterior lean;Right lateral lean Standing balance support: Bilateral upper extremity supported Standing balance-Leahy Scale: Zero Standing balance comment: Strong L lean, Heavy multimodal cues required to lift chest and pick up head.  L knee blocked and heavy assist required to counter L lean.                            Cognition Arousal/Alertness: Awake/alert Behavior During Therapy: Flat affect Overall Cognitive Status: Impaired/Different from baseline Area of Impairment: Orientation;Memory;Following commands;Safety/judgement;Awareness;Problem solving                 Orientation Level: Disoriented to;Place;Situation Current Attention Level: Sustained Memory: Decreased short-term memory  Following Commands: Follows one step commands inconsistently;Follows one step commands with increased time Safety/Judgement: Decreased awareness of safety;Decreased awareness of deficits Awareness: Emergent Problem Solving: Slow processing;Decreased initiation;Difficulty sequencing;Requires verbal cues;Requires tactile cues General Comments: Pt stated that he got up to get dressed and  shave this morning.  Pt also stated he will just "flop back in bed" when he needed to.        Exercises      General Comments General comments (skin integrity, edema, etc.): Pt stated multiple times during session that he needed to void and told he can with catheter.      Pertinent Vitals/Pain Pain Assessment: No/denies pain    Home Living                      Prior Function            PT Goals (current goals can now be found in the care plan section) Acute Rehab PT Goals PT Goal Formulation: With patient Time For Goal Achievement: 04/17/17 Potential to Achieve Goals: Fair Progress towards PT goals: Progressing toward goals    Frequency    Min 4X/week      PT Plan Current plan remains appropriate    Co-evaluation              AM-PAC PT "6 Clicks" Daily Activity  Outcome Measure  Difficulty turning over in bed (including adjusting bedclothes, sheets and blankets)?: Total Difficulty moving from lying on back to sitting on the side of the bed? : Total Difficulty sitting down on and standing up from a chair with arms (e.g., wheelchair, bedside commode, etc,.)?: Total Help needed moving to and from a bed to chair (including a wheelchair)?: Total Help needed walking in hospital room?: Total Help needed climbing 3-5 steps with a railing? : Total 6 Click Score: 6    End of Session Equipment Utilized During Treatment: Gait belt;Oxygen;Other (comment) (Lift pad under pt in chair) Activity Tolerance: Patient tolerated treatment well Patient left: in chair;with call bell/phone within reach;with chair alarm set Nurse Communication: Mobility status;Need for lift equipment PT Visit Diagnosis: Other abnormalities of gait and mobility (R26.89);Unsteadiness on feet (R26.81);Hemiplegia and hemiparesis Hemiplegia - Right/Left: Left Hemiplegia - dominant/non-dominant: Non-dominant Hemiplegia - caused by: Cerebral infarction     Time: 1610-9604 PT Time  Calculation (min) (ACUTE ONLY): 36 min  Charges:  $Therapeutic Activity: 8-22 mins $Neuromuscular Re-education: 8-22 mins                    G Codes:       Willaim Rayas SPT   Willaim Rayas 04/04/2017, 3:14 PM

## 2017-04-04 NOTE — Progress Notes (Signed)
Rehab admissions - Please see rehab MD consult done by Dr. Allena Katz recommending SNF due to lack of care giver support.  Social worker has been in touch with family regarding SNF.  Call me for questions.  #[phone]

## 2017-04-04 NOTE — NC FL2 (Signed)
[place] MEDICAID FL2 LEVEL OF CARE SCREENING TOOL     IDENTIFICATION  Patient Name: Matthew Meza Birthdate: 1941-09-30 Sex: male Admission Date (Current Location): 03/31/2017  [facility] [place] Number:  Producer, television/film/video and Address:  [facility] [place], [address] [place], [place] [address]      Provider Number: [id number]  Attending Physician Name and Address:  Micki Riley, MD  Relative Name and Phone Number:       Current Level of Care: Hospital Recommended Level of Care: Skilled Nursing Facility Prior Approval Number:    Date Approved/Denied:   PASRR Number: [id number]  Discharge Plan: SNF    Current Diagnoses: Patient Active Problem List   Diagnosis Date Noted  . Dysarthria, post-stroke   . Dysphagia, post-stroke   . Tachypnea   . Prediabetes   . Leukocytosis   . Hypophosphatemia   . Stage 2 chronic kidney disease   . 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 Electrolyte imbalance 04/01/2017  . 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 Acute respiratory failure (HCC)   . Essential hypertension   . Acute encephalopathy   . Seasonal allergic rhinitis 10/30/2012    Orientation RESPIRATION BLADDER Height & Weight     Self, Place, Time  O2 (Nasal Cannula; 6L) Continent Weight: 187 lb 9.8 oz (85.1 kg) Height:  5' 6.93" (170 cm)  BEHAVIORAL SYMPTOMS/MOOD NEUROLOGICAL BOWEL NUTRITION STATUS      Continent  (Please see d/c summary)  AMBULATORY STATUS COMMUNICATION OF NEEDS Skin   Extensive Assist Verbally Surgical wounds (Closed incision right groin; guaze dressing)                       Personal Care Assistance Level of Assistance  Bathing, Feeding, Dressing Bathing Assistance: Maximum assistance Feeding assistance: Limited assistance Dressing Assistance: Maximum assistance     Functional Limitations Info  Sight, Hearing, Speech Sight Info: Adequate Hearing Info:  Adequate Speech Info: Adequate    SPECIAL CARE FACTORS FREQUENCY  PT (By licensed PT), OT (By licensed OT)     PT Frequency: 4x/week OT Frequency: 4x/week            Contractures Contractures Info: Not present    Additional Factors Info  Code Status, Allergies Code Status Info: Full Code Allergies Info: No known allergies           Current Medications (04/04/2017):  This is the current hospital active medication list Current Facility-Administered Medications  Medication Dose Route Frequency Provider Last Rate Last Dose  . 0.9 %  sodium chloride infusion   Intravenous Continuous Kalman Shan, MD 50 mL/hr at 04/04/17 0900    . 0.9 %  sodium chloride infusion  250 mL Intravenous PRN Heywood Iles V, MD      . acetaminophen (TYLENOL) tablet 650 mg  650 mg Oral Q4H PRN Rejeana Brock, MD       Or  . acetaminophen (TYLENOL) solution 650 mg  650 mg Per Tube Q4H PRN Rejeana Brock, MD       Or  . acetaminophen (TYLENOL) suppository 650 mg  650 mg Rectal Q4H PRN Rejeana Brock, MD      . aspirin chewable tablet 81 mg  81 mg Per Tube Daily Rejeana Brock, MD   81 mg at 04/02/17 0916  . famotidine (PEPCID) IVPB 20 mg premix  20 mg Intravenous Q12H Praveen Mannam,  MD   Stopped at 04/03/17 2217  . MEDLINE mouth rinse  15 mL Mouth Rinse BID Kalman Shan, MD   15 mL at 04/03/17 2142  . midazolam (VERSED) injection 2 mg  2 mg Intravenous Q15 min PRN Beather Arbour, MD      . midazolam (VERSED) injection 2 mg  2 mg Intravenous Q2H PRN Beather Arbour, MD   2 mg at 04/01/17 0820  . nicardipine (CARDENE)  in 0.86% saline IV infusion (0.1 mg/ml)  0-15 mg/hr Intravenous Continuous Gilmer Mor, DO   Stopped at 04/02/17 0202  . ondansetron (ZOFRAN) injection 4 mg  4 mg Intravenous Q6H PRN Rushil Terrilee Croak, MD      . sennosides (SENOKOT) 8.8 MG/5ML syrup 5 mL  5 mL Per Tube BID PRN Rushil Terrilee Croak, MD         Discharge Medications: Please see discharge  summary for a list of discharge medications.  Relevant Imaging Results:  Relevant Lab Results:   Additional Information SSN: [id number]  Maree Krabbe, LCSW  I have personally examined this patient, reviewed notes, independently viewed imaging studies, participated in medical decision making and plan of care.ROS completed by me personally and pertinent positives fully documented  I have made any additions or clarifications directly to the above note. Agree with note above.   Delia Heady, MD Medical Director [facility] Pager: [phone] 04/05/2017 3:35 PM

## 2017-04-04 NOTE — Progress Notes (Signed)
STROKE TEAM PROGRESS NOTE   HISTORY OF PRESENT ILLNESS (per record) Matthew Meza is a 76 y.o. male who was last in his normal state of health definitely yesterday evening when he interacted with someone at his facility. 4 AM, he got up and went to the bathroom and felt normal and that time. When he tried it up for more to the bathroom, however, he fell and was unable to get up. He was then unable to get to a phone for about 6 hours and once he finally did, he called 911 and was brought in as a code stroke.   LKW: 4/26 evening tpa given?: no, outside of window Modified Rankin Score: 0    SUBJECTIVE (INTERVAL HISTORY) No family members present. He   is doing well. The patient is able to follow   commands.Speech therapy recommends NPO.Elevated WBC count is coming down  OBJECTIVE Temp:  [97.2 F (36.2 C)-98.9 F (37.2 C)] 98.9 F (37.2 C) (05/01 0540) Pulse Rate:  [57-65] 57 (05/01 0540) Cardiac Rhythm: Normal sinus rhythm (05/01 0700) Resp:  [18-20] 18 (05/01 0540) BP: (118-132)/(60-72) 123/68 (05/01 0540) SpO2:  [94 %-98 %] 94 % (05/01 0540)  CBC:   Recent Labs Lab 04/03/17 0710 04/04/17 0538  WBC 16.1* 9.1  NEUTROABS 13.9* 7.1  HGB 13.3 12.2*  HCT 38.8* 35.8*  MCV 90.4 90.2  PLT 120* 114*    Basic Metabolic Panel:   Recent Labs Lab 04/01/17 0441 04/02/17 0055  04/03/17 0710 04/04/17 0538  NA 139 140  --   --   --   K 4.3 3.7  --   --   --   CL 110 111  --   --   --   CO2 21* 22  --   --   --   GLUCOSE 184* 164*  --   --   --   BUN 13 20  --   --   --   CREATININE 1.18 1.14  --   --   --   CALCIUM 7.7* 7.7*  --   --   --   MG 1.4*  --   < > 1.8 2.0  PHOS 2.0*  --   < > 2.4* 1.8*  < > = values in this interval not displayed.  Lipid Panel:     Component Value Date/Time   CHOL 148 04/01/2017 0442   TRIG 109 04/01/2017 0442   HDL 27 (L) 04/01/2017 0442   CHOLHDL 5.5 04/01/2017 0442   VLDL 22 04/01/2017 0442   LDLCALC 99 04/01/2017 0442   HgbA1c:   Lab Results  Component Value Date   HGBA1C 6.2 (H) 04/01/2017   Urine Drug Screen:     Component Value Date/Time   LABOPIA NONE DETECTED 12/13/2009 2102   COCAINSCRNUR NONE DETECTED 12/13/2009 2102   LABBENZ NONE DETECTED 12/13/2009 2102   AMPHETMU NONE DETECTED 12/13/2009 2102   THCU NONE DETECTED 12/13/2009 2102   LABBARB  12/13/2009 2102    NONE DETECTED        DRUG SCREEN FOR MEDICAL PURPOSES ONLY.  IF CONFIRMATION IS NEEDED FOR ANY PURPOSE, NOTIFY LAB WITHIN 5 DAYS.        LOWEST DETECTABLE LIMITS FOR URINE DRUG SCREEN Drug Class       Cutoff (ng/mL) Amphetamine      1000 Barbiturate      200 Benzodiazepine   200 Tricyclics       300 Opiates  300 Cocaine          300 THC              50    Alcohol Level     Component Value Date/Time   [facility]  12/13/2009 2140    <5        LOWEST DETECTABLE LIMIT FOR SERUM ALCOHOL IS 5 mg/dL FOR MEDICAL PURPOSES ONLY    IMAGING  Ct Angio Head W Or Wo Contrast Ct Angio Neck W Or Wo Contrast Ct Cerebral Perfusion W Contrast 03/31/2017 1. Positive for emergent large vessel occlusion, and favorable CT perfusion findings for endovascular re-perfusion.  2. Occlusion of the Right ICA just beyond its origin. Reconstitution of the Right ICA terminus beginning at the anterior genu (perhaps via the right ophthalmic artery). But superimposed Right MCA mid M1 segment occlusion.  3. CT perfusion indicates a core infarct of 40 mL in the Right MCA territory and right hemisphere penumbra of 146 mL.  4. The above was relayed to Dr. Ritta Slot beginning at 1059 hours.  5. Stenosis of the right ACA origin, but the right A1 segment may be non dominant. Right A2 and distal ACA enhancement is within normal limits. Normal left MCA and left ACA.  6. Aortic arch and bilateral carotid atherosclerosis without additional stenosis.  7. Mild left vertebral artery origin stenosis due to soft plaque. Otherwise negative posterior circulation.  8.  Calcified coronary artery atherosclerosis.   Ct Head Wo Contrast 04/01/2017 Developing bland and hemorrhagic infarction in the RIGHT hemisphere corresponding roughly to the area of infarcted core of brain on recent CT perfusion.    Ct Head Wo Contrast 03/31/2017 1. Extensive areas of hyperdensity involving the basal ganglia and right MCA territory cortex likely reflecting luxury perfusion associated with the areas of acute infarction following revascularization. Hemorrhage is not entirely excluded, but felt to be a minority of the hyperdensity visualized. The areas largely correspond to the areas of core infarction on the CTP study.  2. A tiny amount of subarachnoid hemorrhage may be present without intraventricular hemorrhage.      Ir US Guide Vasc Access Right 04/01/2017 IMPRESSION: Status post cerebral angiogram and treatment of tandem occlusion of the right ICA and right M1, with emergent stent system of the right ICA for treatment of dissection and intraluminal thrombus, and mechanical thrombectomy of the right MCA and carotid siphon, with restoration of TICI 3 flow. Deployment of Exoseal for hemostasis. Signed, Yvone Neu. Loreta Ave DO [facility] Radiology PLAN: Patient is stable to CT department for head CT Admit to neuro ICU. First 24 hours target systolic blood pressure of 120-140. Right hip straight for 6 hours status post removal of 8 French sheath. Electronically Signed   By: Gilmer Mor D.O.   On: 04/01/2017 11:23     Dg Chest Port 1 View 04/01/2017 Early bibasilar atelectasis similar to priors.   Dg Chest Port 1 View 03/31/2017 1. Unremarkable positioning of endotracheal and orogastric tubes. 2. Low volumes with mild atelectasis.     Ct Head Code Stroke W/o Cm 03/31/2017 1. Emergent right MCA large vessel occlusion suspected. No associated hemorrhage or mass effect.  2. ASPECTS is 9.    PHYSICAL EXAM Elderly Caucasian male   .  Marland Kitchen Afebrile. Head is nontraumatic. Neck is supple without bruit.    Cardiac exam no murmur or gallop. Lungs are clear to auscultation. Distal pulses are well felt.  Neurological Exam :  drowsy Opens  eyes and follows midline and commands on right side  .Marland Kitchen Left gaze preference but able to look to right. Blinks to threat bilaterally. Fundi not visualized.left lower face weakness. Tongue midline. Left hemiplegia but able to withdraw to pain lower extremity more than upper extremity. Purposeful antigravity movements on the right side. Left plantar upgoing right downgoing. ASSESSMENT/PLAN Mr. WELBORN KEENA is a 76 y.o. male with no significant past medical history presenting with left hemiparesis. He did not receive IV t-PA due to late presentation. Status post IR thrombectomy and stenting as above.  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 Superimposed Right MCA mid M1 segment occlusion  Carotid Doppler - CTA neck 2D Echo - Normal LV function; trace MR and TR; mildly elevated pulmonary    pressure.  LDL - 99  HgbA1c 6.2   VTE prophylaxis - SCDs  Diet NPO time specified  No antithrombotic prior to admission, now on aspirin 81 mg daily (Plavix discontinued secondary to hemorrhage)  Patient will be counseled to be compliant with his antithrombotic medications  Ongoing aggressive stroke risk factor management  Therapy recommendations: pending  Disposition: Pending  Hypertension  Blood pressure tends to run low  Permissive hypertension (OK if < 220/120) but gradually normalize in 5-7 days  Long-term BP goal normotensive  Hyperlipidemia  Home meds: No lipid lowering medications prior to admission  LDL 99, goal < 70  Start Lipitor 40 mg daily when access is available  Continue statin at  discharge    Other Stroke Risk Factors  Advanced age  The patient quit smoking 21 years ago.  Obesity, Body mass index is 29.45 kg/m., recommend weight loss, diet and exercise as appropriate   Family hx stroke (mother)    Other Active Problems  Hemorrhagic conversion -> discontinue Plavix     Electrolyte abnormalities - recheck in a.m.  Hyperglycemia - await hemoglobin A1c  Hospital day # 4  I have personally examined this patient, reviewed notes, independently viewed imaging studies, participated in medical decision making and plan of care.ROS completed by me personally and pertinent positives fully documented  I have made any additions or clarifications directly to the above note.  He presented with left hemiplegia with tandem occlusions of right internal carotid and middle cerebral artery and underwent emergent recanalization with RICA rescue stent .Plan : Continue aspirin but hold plavix till CT shows isodense blood in few days.   Mobilize out of bed and therapy and rehab consults. ST to  follow swallow. D/W Speech therapy.  Await SNF rehab bed when able to swallow. Called daughter and left message to call me back to discuss panda tube for feeds.      Delia Heady, MD Medical Director [facility] Pager: [phone] 04/04/2017 2:03 PM    To contact Stroke Continuity provider, please refer to [email or web address]. After hours, contact General Neurology

## 2017-04-05 LAB — GLUCOSE, CAPILLARY: GLUCOSE-CAPILLARY: 86 mg/dL (ref 65–99)

## 2017-04-05 LAB — CBC WITH DIFFERENTIAL/PLATELET
Basophils Absolute: 0 10*3/uL (ref 0.0–0.1)
Basophils Relative: 0 %
EOS ABS: 0.3 10*3/uL (ref 0.0–0.7)
Eosinophils Relative: 4 %
HCT: 34.7 % — ABNORMAL LOW (ref 39.0–52.0)
HEMOGLOBIN: 12.2 g/dL — AB (ref 13.0–17.0)
Lymphocytes Relative: 18 %
Lymphs Abs: 1.5 10*3/uL (ref 0.7–4.0)
MCH: 31.8 pg (ref 26.0–34.0)
MCHC: 35.2 g/dL (ref 30.0–36.0)
MCV: 90.4 fL (ref 78.0–100.0)
MONOS PCT: 6 %
Monocytes Absolute: 0.5 10*3/uL (ref 0.1–1.0)
NEUTROS ABS: 5.9 10*3/uL (ref 1.7–7.7)
NEUTROS PCT: 72 %
Platelets: 142 10*3/uL — ABNORMAL LOW (ref 150–400)
RBC: 3.84 MIL/uL — AB (ref 4.22–5.81)
RDW: 12.8 % (ref 11.5–15.5)
WBC: 8.2 10*3/uL (ref 4.0–10.5)

## 2017-04-05 LAB — MAGNESIUM: MAGNESIUM: 1.9 mg/dL (ref 1.7–2.4)

## 2017-04-05 LAB — PHOSPHORUS: Phosphorus: 2.3 mg/dL — ABNORMAL LOW (ref 2.5–4.6)

## 2017-04-05 MED ORDER — BISACODYL 10 MG RE SUPP
10.0000 mg | Freq: Every day | RECTAL | Status: DC | PRN
  Administered 2017-04-05 – 2017-04-11 (×2): 10 mg via RECTAL
  Filled 2017-04-05 (×2): qty 1

## 2017-04-05 MED ORDER — ASPIRIN 81 MG PO CHEW
81.0000 mg | CHEWABLE_TABLET | Freq: Every day | ORAL | Status: DC
  Administered 2017-04-08 – 2017-04-09 (×2): 81 mg
  Filled 2017-04-05 (×3): qty 1

## 2017-04-05 MED ORDER — ASPIRIN 300 MG RE SUPP
300.0000 mg | Freq: Every day | RECTAL | Status: DC
  Administered 2017-04-05 – 2017-04-06 (×2): 300 mg via RECTAL
  Filled 2017-04-05 (×3): qty 1

## 2017-04-05 NOTE — Progress Notes (Signed)
STROKE TEAM PROGRESS NOTE   HISTORY OF PRESENT ILLNESS (per record) Matthew Meza is a 76 y.o. male who was last in his normal state of health definitely yesterday evening when he interacted with someone at his facility. 4 AM, he got up and went to the bathroom and felt normal and that time. When he tried it up for more to the bathroom, however, he fell and was unable to get up. He was then unable to get to a phone for about 6 hours and once he finally did, he called 911 and was brought in as a code stroke.   LKW: 4/26 evening tpa given?: no, outside of window Modified Rankin Score: 0    SUBJECTIVE (INTERVAL HISTORY) No family members present. He   is doing well. The patient is able to follow   commands.Speech therapy recommends NPO.Elevated WBC count continues to come down  OBJECTIVE Temp:  [97.6 F (36.4 C)-98.9 F (37.2 C)] 97.9 F (36.6 C) (05/02 0930) Pulse Rate:  [56-65] 63 (05/02 0930) Cardiac Rhythm: Sinus bradycardia (05/02 0757) Resp:  [18-20] 20 (05/02 0930) BP: (124-143)/(61-71) 143/65 (05/02 0930) SpO2:  [95 %-97 %] 97 % (05/02 0930) Weight:  [207 lb 1.6 oz (93.9 kg)] 207 lb 1.6 oz (93.9 kg) (05/02 0516)  CBC:   Recent Labs Lab 04/04/17 0538 04/05/17 0356  WBC 9.1 8.2  NEUTROABS 7.1 5.9  HGB 12.2* 12.2*  HCT 35.8* 34.7*  MCV 90.2 90.4  PLT 114* 142*    Basic Metabolic Panel:   Recent Labs Lab 04/01/17 0441 04/02/17 0055  04/04/17 0538 04/05/17 0356  NA 139 140  --   --   --   K 4.3 3.7  --   --   --   CL 110 111  --   --   --   CO2 21* 22  --   --   --   GLUCOSE 184* 164*  --   --   --   BUN 13 20  --   --   --   CREATININE 1.18 1.14  --   --   --   CALCIUM 7.7* 7.7*  --   --   --   MG 1.4*  --   < > 2.0 1.9  PHOS 2.0*  --   < > 1.8* 2.3*  < > = values in this interval not displayed.  Lipid Panel:     Component Value Date/Time   CHOL 148 04/01/2017 0442   TRIG 109 04/01/2017 0442   HDL 27 (L) 04/01/2017 0442   CHOLHDL 5.5  04/01/2017 0442   VLDL 22 04/01/2017 0442   LDLCALC 99 04/01/2017 0442   HgbA1c:  Lab Results  Component Value Date   HGBA1C 6.2 (H) 04/01/2017   Urine Drug Screen:     Component Value Date/Time   LABOPIA NONE DETECTED 12/13/2009 2102   COCAINSCRNUR NONE DETECTED 12/13/2009 2102   LABBENZ NONE DETECTED 12/13/2009 2102   AMPHETMU NONE DETECTED 12/13/2009 2102   THCU NONE DETECTED 12/13/2009 2102   LABBARB  12/13/2009 2102    NONE DETECTED        DRUG SCREEN FOR MEDICAL PURPOSES ONLY.  IF CONFIRMATION IS NEEDED FOR ANY PURPOSE, NOTIFY LAB WITHIN 5 DAYS.        LOWEST DETECTABLE LIMITS FOR URINE DRUG SCREEN Drug Class       Cutoff (ng/mL) Amphetamine      1000 Barbiturate      200 Benzodiazepine   200  Tricyclics       300 Opiates          300 Cocaine          300 THC              50    Alcohol Level     Component Value Date/Time   [facility]  12/13/2009 2140    <5        LOWEST DETECTABLE LIMIT FOR SERUM ALCOHOL IS 5 mg/dL FOR MEDICAL PURPOSES ONLY    IMAGING  Ct Angio Head W Or Wo Contrast Ct Angio Neck W Or Wo Contrast Ct Cerebral Perfusion W Contrast 03/31/2017 1. Positive for emergent large vessel occlusion, and favorable CT perfusion findings for endovascular re-perfusion.  2. Occlusion of the Right ICA just beyond its origin. Reconstitution of the Right ICA terminus beginning at the anterior genu (perhaps via the right ophthalmic artery). But superimposed Right MCA mid M1 segment occlusion.  3. CT perfusion indicates a core infarct of 40 mL in the Right MCA territory and right hemisphere penumbra of 146 mL.  4. The above was relayed to Dr. Ritta Slot beginning at 1059 hours.  5. Stenosis of the right ACA origin, but the right A1 segment may be non dominant. Right A2 and distal ACA enhancement is within normal limits. Normal left MCA and left ACA.  6. Aortic arch and bilateral carotid atherosclerosis without additional stenosis.  7. Mild left vertebral  artery origin stenosis due to soft plaque. Otherwise negative posterior circulation.  8. Calcified coronary artery atherosclerosis.   Ct Head Wo Contrast 04/01/2017 Developing bland and hemorrhagic infarction in the RIGHT hemisphere corresponding roughly to the area of infarcted core of brain on recent CT perfusion.    Ct Head Wo Contrast 03/31/2017 1. Extensive areas of hyperdensity involving the basal ganglia and right MCA territory cortex likely reflecting luxury perfusion associated with the areas of acute infarction following revascularization. Hemorrhage is not entirely excluded, but felt to be a minority of the hyperdensity visualized. The areas largely correspond to the areas of core infarction on the CTP study.  2. A tiny amount of subarachnoid hemorrhage may be present without intraventricular hemorrhage.      Ir US Guide Vasc Access Right 04/01/2017 IMPRESSION: Status post cerebral angiogram and treatment of tandem occlusion of the right ICA and right M1, with emergent stent system of the right ICA for treatment of dissection and intraluminal thrombus, and mechanical thrombectomy of the right MCA and carotid siphon, with restoration of TICI 3 flow. Deployment of Exoseal for hemostasis. Signed, Yvone Neu. Loreta Ave DO [facility] Radiology PLAN: Patient is stable to CT department for head CT Admit to neuro ICU. First 24 hours target systolic blood pressure of 120-140. Right hip straight for 6 hours status post removal of 8 French sheath. Electronically Signed   By: Gilmer Mor D.O.   On: 04/01/2017 11:23     Dg Chest Port 1 View 04/01/2017 Early bibasilar atelectasis similar to priors.   Dg Chest Port 1 View 03/31/2017 1. Unremarkable positioning of endotracheal and orogastric tubes. 2. Low volumes with mild atelectasis.     Ct Head Code Stroke W/o Cm 03/31/2017 1. Emergent right MCA large vessel occlusion suspected. No associated  hemorrhage or mass effect.  2. ASPECTS is 9.    PHYSICAL EXAM Elderly Caucasian male  .  Marland Kitchen Afebrile. Head is nontraumatic. Neck is supple without bruit.    Cardiac exam no murmur or  gallop. Lungs are clear to auscultation. Distal pulses are well felt.  Neurological Exam :  Awake and follows midline and commands on right side  .Marland Kitchen Left gaze preference but able to look to right. Blinks to threat bilaterally. Fundi not visualized.left lower face weakness. Tongue midline. Left hemiplegia but able to withdraw to pain lower extremity more than upper extremity. Purposeful antigravity movements on the right side. Left plantar upgoing right downgoing. ASSESSMENT/PLAN Matthew Meza is a 76 y.o. male with no significant past medical history presenting with left hemiparesis. He did not receive IV t-PA due to late presentation. Status post IR thrombectomy and stenting as above.  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 Superimposed Right MCA mid M1 segment occlusion  Carotid Doppler - CTA neck 2D Echo - Normal LV function; trace MR and TR; mildly elevated pulmonary    pressure.  LDL - 99  HgbA1c 6.2   VTE prophylaxis - SCDs  Diet NPO time specified  No antithrombotic prior to admission, now on aspirin 81 mg daily (Plavix discontinued secondary to hemorrhage)  Patient will be counseled to be compliant with his antithrombotic medications  Ongoing aggressive stroke risk factor management  Therapy recommendations: pending  Disposition: Pending  Hypertension  Blood pressure tends to run low  Permissive hypertension (OK if < 220/120) but gradually normalize in 5-7 days  Long-term BP goal normotensive  Hyperlipidemia  Home meds: No lipid lowering medications prior to admission  LDL 99, goal <  70  Start Lipitor 40 mg daily when access is available  Continue statin at discharge    Other Stroke Risk Factors  Advanced age  The patient quit smoking 21 years ago.  Obesity, Body mass index is 32.51 kg/m., recommend weight loss, diet and exercise as appropriate   Family hx stroke (mother)    Other Active Problems  Hemorrhagic conversion -> discontinue Plavix     Electrolyte abnormalities - recheck in a.m.  Hyperglycemia - await hemoglobin A1c  Post stroke dysphagia  Hospital day # 5  I have personally examined this patient, reviewed notes, independently viewed imaging studies, participated in medical decision making and plan of care.ROS completed by me personally and pertinent positives fully documented  I have made any additions or clarifications directly to the above note.  He presented with left hemiplegia with tandem occlusions of right internal carotid and middle cerebral artery and underwent emergent recanalization with RICA rescue stent .Plan :   D/W Speech therapy. Patient will likely need PEG tube. Start tube feeds. Consult trauma team for PEG. Discuss with patient's daughter over the phone who agrees with the plan.  Await SNF rehab bed when able to swallow. Or has a PEG tube   greater than 50% time during this 25 minute visit was spent on counseling and coordination of care about his stroke, postoperative dysphagia, tube feeding discussion and answering questions  Delia Heady, MD Medical Director [facility] Pager: [phone] 04/05/2017 2:42 PM    To contact Stroke Continuity provider, please refer to [email or web address]. After hours, contact General Neurology

## 2017-04-05 NOTE — Clinical Social Work Note (Signed)
Clinical Social Work Assessment  Patient Details  Name: Matthew Meza MRN: [id number] Date of Birth: 05-16-1941  Date of referral:  04/05/17               Reason for consult:  Facility Placement                Permission sought to share information with:    Permission granted to share information::     Name::        Agency::     Relationship::     Contact Information:     Housing/Transportation Living arrangements for the past 2 months:  Hotel/Motel Source of Information:  Adult Children Patient Interpreter Needed:  None Criminal Activity/Legal Involvement Pertinent to Current Situation/Hospitalization:  No - Comment as needed Significant Relationships:  Adult Children, Siblings Lives with:  Self Do you feel safe going back to the place where you live?    Need for family participation in patient care:     Care giving concerns:  No family or friends present at bedside. Pt is only alert to self and place. CSW attempted to speak with pt however pt was first out of the room on 5/1 when CSW went to complete assessment.    Social Worker assessment / plan:  CSW spoke with pt's sister via telephone. Pt's sister said she would contact pt's daughter who was out of town. CSW the spoke with pt's daughter. Per pt's daughter pt lives in a hotel in [place], alone. Pt's daughter states pt will probably not want to go to SNF however, he will need it. Pt's daughter headed into town today and will be at the hospital this afternoon. CSW provided pt's daughter b/o and pt's daughter chooses 73.  Employment status:  Retired Database administrator PT Recommendations:  Skilled Nursing Facility Information / Referral to community resources:  Skilled Nursing Facility  Patient/Family's Response to care:  Pt's daughter verbalized understanding of CSW role and expressed appreciation for support. Pt's daughter denies any concern regarding pt care at this time.   Patient/Family's  Understanding of and Emotional Response to Diagnosis, Current Treatment, and Prognosis: Pt's daughter understanding and realistic regarding physical limitations. Pt's daughter understands the need for SNF placement at d/c. Pt's daughter agreeable to SNF placement at d/c, at this time. Pt's daughter denies any concern regarding treatment plan at this time. CSW will continue to provide support and facilitate d/c needs.    Emotional Assessment Appearance:  Appears stated age Attitude/Demeanor/Rapport:  Unable to Assess Affect (typically observed):  Unable to Assess Orientation:  Oriented to Self, Oriented to Place Alcohol / Substance use:  Not Applicable Psych involvement (Current and /or in the community):     Discharge Needs  Concerns to be addressed:  Care Coordination Readmission within the last 30 days:    Current discharge risk:  Dependent with Mobility, Lives alone Barriers to Discharge:  English as a second language teacher, Continued Medical Work up   [employer], LCSW 04/05/2017, 11:37 AM

## 2017-04-05 NOTE — Clinical Social Work Placement (Addendum)
   CLINICAL SOCIAL WORK PLACEMENT  NOTE  Date:  04/05/2017  Patient Details  Name: Matthew Meza MRN: [id number] Date of Birth: 25-Dec-1940  Clinical Social Work is seeking post-discharge placement for this patient at the Skilled  Nursing Facility level of care (*CSW will initial, date and re-position this form in  chart as items are completed):      Patient/family provided with [facility] Clinical Social Work Department's list of facilities offering this level of care within the geographic area requested by the patient (or if unable, by the patient's family).  Yes   Patient/family informed of their freedom to choose among providers that offer the needed level of care, that participate in Medicare, Medicaid or managed care program needed by the patient, have an available bed and are willing to accept the patient.      Patient/family informed of [facility]'s ownership interest in [facility] and [facility], as well as of the fact that they are under no obligation to receive care at these facilities.  PASRR submitted to EDS on       PASRR number received on 04/04/17     Existing PASRR number confirmed on       FL2 transmitted to all facilities in geographic area requested by pt/family on 04/04/17     FL2 transmitted to all facilities within larger geographic area on       Patient informed that his/her managed care company has contracts with or will negotiate with certain facilities, including the following:        Yes   Patient/family informed of bed offers received.  Patient chooses bed at [facility]     Physician recommends and patient chooses bed at      Patient to be transferred to [facility] on 04/10/17.  Patient to be transferred to facility by [facility]     Patient family notified on 04/10/17 of transfer.  Name of family member notified:        PHYSICIAN       Additional Comment:    _______________________________________________ Maree Krabbe,  LCSW 04/05/2017, 11:43 AM

## 2017-04-05 NOTE — Progress Notes (Signed)
RN notified  cortrack team of cortrak order placed per Dr Pearlean Brownie

## 2017-04-05 NOTE — Progress Notes (Signed)
Occupational Therapy Treatment Patient Details Name: Matthew Meza MRN: [id number] DOB: 08/25/1941 Today's Date: 04/05/2017    History of present illness 76 yo admitted with Rt MCA CVA  emergent stent system of the right ICA for treatment of dissection and intraluminal thrombus, and mechanical thrombectomy of the right MCA and carotid siphon, VDRF 4/27-4/29. No significant PMHx    OT comments  Pt making progress towards OT goals this session with increased independence in bed mobility and sitting balance this session. Pt benefits from single step instructions, increased time, and tactile cues for mobility. Pt with increased awareness of Left side this session, asking for the therapist to help it during transfer with the STEDY. Please see progress notes below. Pt continues to benefit from skilled OT in the acute setting. Next session to focus on functional ADL while maintaining sitting balance.   Follow Up Recommendations  SNF;Supervision/Assistance - 24 hour    Equipment Recommendations  Other (comment) (defer to next venue)    Recommendations for Other Services      Precautions / Restrictions Precautions Precautions: Fall Precaution Comments: left hemiplegia, neglect Restrictions Weight Bearing Restrictions: No       Mobility Bed Mobility Overal bed mobility: Needs Assistance Bed Mobility: Rolling;Sidelying to Sit Rolling: Mod assist Sidelying to sit: Mod assist;HOB elevated       General bed mobility comments: max, single step cueing necessary - Pt able to perform at min to mod 1 person assist when given time, verbal and tactile cues. No bed pad needed. Pt used rails to assist to EOB  Transfers Overall transfer level: Needs assistance   Transfers: Sit to/from Stand Sit to Stand: +2 physical assistance;From elevated surface;Mod assist         General transfer comment: vc for head and trunk positioning during sit <>stand, Pt continues with strong L lean     Balance Overall balance assessment: Needs assistance Sitting-balance support: Single extremity supported;Feet supported Sitting balance-Leahy Scale: Poor Sitting balance - Comments: min A to sit EOB - cues to put RUE in lap (Pt pushing to the right) Pt able to find midline with cues and hold for brief periods of time. Postural control: Posterior lean;Right lateral lean Standing balance support: Bilateral upper extremity supported Standing balance-Leahy Scale: Zero Standing balance comment: strong L lean continues, Pt sit <> stand x1 with no DME +2 mod and Pt able to use STEDY to transfer to recliner                           ADL either performed or assessed with clinical judgement   ADL Overall ADL's : Needs assistance/impaired Eating/Feeding: NPO                       Toilet Transfer: +2 for physical assistance;+2 for safety/equipment;BSC (used STEDY) Toilet Transfer Details (indicate cue type and reason): simulated to recliner Toileting- Clothing Manipulation and Hygiene: Total assistance Toileting - Clothing Manipulation Details (indicate cue type and reason): foley; RN and Therapist providing assist for rear peri care     Functional mobility during ADLs: +2 for safety/equipment;+2 for physical assistance;Cueing for safety;Cueing for sequencing (STEDY) General ADL Comments: Pt needed constant cues to keep his eyes open during session and attend to task at hand, worked on seated balance and bed mobility     Vision   Vision Assessment?: Vision impaired- to be further tested in functional context   Perception  Praxis      Cognition Arousal/Alertness: Awake/alert Behavior During Therapy: Flat affect Overall Cognitive Status: Impaired/Different from baseline Area of Impairment: Orientation;Memory;Following commands;Safety/judgement;Awareness;Problem solving                 Orientation Level: Disoriented to;Place;Situation Current Attention Level:  Sustained Memory: Decreased short-term memory Following Commands: Follows one step commands inconsistently;Follows one step commands with increased time Safety/Judgement: Decreased awareness of safety;Decreased awareness of deficits Awareness: Emergent Problem Solving: Slow processing;Decreased initiation;Difficulty sequencing;Requires verbal cues;Requires tactile cues General Comments: Pt making some appropriately timed jokes this session; and increasing awareness of left side "Help me put my hand up there" (in reference to standing up with STEDY)        Exercises     Shoulder Instructions       General Comments      Pertinent Vitals/ Pain       Pain Assessment: No/denies pain  Home Living                                          Prior Functioning/Environment              Frequency  Min 2X/week        Progress Toward Goals  OT Goals(current goals can now be found in the care plan section)  Progress towards OT goals: Progressing toward goals  Acute Rehab OT Goals Patient Stated Goal: walk and put my pants on  OT Goal Formulation: With patient Time For Goal Achievement: 04/17/17 Potential to Achieve Goals: Good  Plan Discharge plan remains appropriate;Frequency remains appropriate    Co-evaluation                 AM-PAC PT "6 Clicks" Daily Activity     Outcome Measure   Help from another person eating meals?: Total Help from another person taking care of personal grooming?: A Lot Help from another person toileting, which includes using toliet, bedpan, or urinal?: Total Help from another person bathing (including washing, rinsing, drying)?: A Lot Help from another person to put on and taking off regular upper body clothing?: A Lot Help from another person to put on and taking off regular lower body clothing?: Total 6 Click Score: 9    End of Session Equipment Utilized During Treatment: Gait belt;Oxygen  OT Visit Diagnosis: Other  abnormalities of gait and mobility (R26.89);Muscle weakness (generalized) (M62.81);Low vision, both eyes (H54.2);Other symptoms and signs involving cognitive function;Cognitive communication deficit (R41.841);Hemiplegia and hemiparesis Symptoms and signs involving cognitive functions: Cerebral infarction Hemiplegia - Right/Left: Left Hemiplegia - dominant/non-dominant: Non-Dominant Hemiplegia - caused by: Cerebral infarction   Activity Tolerance Patient tolerated treatment well   Patient Left in chair;with call bell/phone within reach;with chair alarm set   Nurse Communication Mobility status;Need for lift equipment (maximove)        Time: 4742-5956 OT Time Calculation (min): 65 min  Charges: OT General Charges $OT Visit: 1 Procedure OT Treatments $Self Care/Home Management : 23-37 mins $Therapeutic Activity: 23-37 mins  Matthew Meza OTR/L [id number]  Evern Bio Arlean Thies 04/05/2017, 2:22 PM

## 2017-04-05 NOTE — Consult Note (Signed)
[facility] Surgery Consult/Admission Note  Matthew Meza [facility] 05/09/1941  [id number].    Requesting MD: Dr. Pearlean Brownie Chief Complaint/Reason for Consult: G tube placement  HPI:  Matthew Meza is a 76 y.o. male who was last in his normal state of health PTA when he fell and was unable to get up. He was then unable to get to a phone for about 6 hours and once he finally did, he called 911 and was brought into the ED as a code stroke. He was out of the window for tpa. Pt has severe left hemiparesis. Pt denies pain but he is complaining of severe hiccups at this time. He is also complaining of left sided weakness.   ED Course:  CT Head IMPRESSION: 1. Extensive areas of hyperdensity involving the basal ganglia and right MCA territory cortex likely reflecting luxury perfusion associated with the areas of acute infarction following revascularization. Hemorrhage is not entirely excluded, but felt to be a minority of the hyperdensity visualized. The areas largely correspond to the areas of core infarction on the CTP study. 2. A tiny amount of subarachnoid hemorrhage may be present without intraventricular hemorrhage.  04/01/17 S/P emergent stent system of the right ICA for treatment of dissection and intraluminal thrombus, and mechanical thrombectomy of the right MCA and carotid siphon, with restoration of TICI 3 flow  ROS:  Review of Systems  Constitutional: Negative for chills and fever.  Eyes: Negative for discharge.  Respiratory: Negative for shortness of breath.   Cardiovascular: Negative for chest pain.  Gastrointestinal: Negative for abdominal pain and vomiting.       Hiccups  Musculoskeletal: Negative for neck pain.  Neurological: Positive for focal weakness.  All other systems reviewed and are negative.    Family History  Problem Relation Age of Onset  . Stroke Mother     Past Medical History:  Diagnosis Date  . No pertinent past medical history     Past  Surgical History:  Procedure Laterality Date  . APPENDECTOMY    . IR ANGIO INTRA EXTRACRAN SEL COM CAROTID INNOMINATE UNI L MOD SED  03/31/2017  . IR INTRAVSC STENT CERV CAROTID W/O EMB-PROT MOD SED INC ANGIO  03/31/2017  . IR PERCUTANEOUS ART THROMBECTOMY/INFUSION INTRACRANIAL INC DIAG ANGIO  03/31/2017  . IR US GUIDE VASC ACCESS RIGHT  03/31/2017  . LAPAROSCOPIC APPENDECTOMY  06/21/2012   Procedure: APPENDECTOMY LAPAROSCOPIC;  Surgeon: Kandis Cocking, MD;  Location: [facility] ORS;  Service: General;  Laterality: N/A;  . NO PAST SURGERIES    . RADIOLOGY WITH ANESTHESIA N/A 03/31/2017   Procedure: RADIOLOGY WITH ANESTHESIA;  Surgeon: Gilmer Mor, DO;  Location: [facility] OR;  Service: Anesthesiology;  Laterality: N/A;    Social History:  reports that he quit smoking about 21 years ago. He quit after 20.00 years of use. He has never used smokeless tobacco. He reports that he does not drink alcohol or use drugs.  Allergies:  Allergies  Allergen Reactions  . No Known Allergies     Medications Prior to Admission  Medication Sig Dispense Refill  . b complex vitamins tablet Take 1 tablet by mouth every other day.    . Cholecalciferol (VITAMIN D) 400 UNITS capsule Take 400 Units by mouth daily.    . Garlic 1000 MG CAPS Take 1 capsule by mouth every other day.    . Lactobacillus Rhamnosus, GG, ([employer] PROBIOTIC, LACTOBACILLUS,) CAPS Use as directed on the bottle. (Patient not taking: Reported on 10/10/2015) 30 capsule 6  .  magnesium 30 MG tablet Take 30 mg by mouth every other day.    . Multiple Vitamin (MULTIVITAMIN WITH MINERALS) TABS Take 1 tablet by mouth daily.    . predniSONE (DELTASONE) 10 MG tablet 6-5-4-3-2-1 po pc for sinus (Patient not taking: Reported on 10/10/2015) 21 tablet 0  . psyllium (METAMUCIL SMOOTH TEXTURE) 28 % packet Take 1 packet by mouth 2 (two) times daily. (Patient not taking: Reported on 10/10/2015) 30 packet 6    Blood pressure (!) 143/65, pulse 63, temperature 97.9 F (36.6 C),  temperature source Oral, resp. rate 20, height 5' 6.93" (1.7 m), weight 207 lb 1.6 oz (93.9 kg), SpO2 97 %.  Physical Exam  Constitutional: No distress.  Elderly white male  HENT:  Head: Normocephalic and atraumatic.  Mouth/Throat: Oropharynx is clear and moist. No oropharyngeal exudate.  Eyes: Conjunctivae and EOM are normal. Pupils are equal, round, and reactive to light. Right eye exhibits no discharge. Left eye exhibits no discharge. No scleral icterus.  Neck: Normal range of motion. Neck supple. No tracheal deviation present. No thyromegaly present.  Cardiovascular: Normal rate, regular rhythm, normal heart sounds and intact distal pulses.   No murmur heard. Pulses:      Radial pulses are 2+ on the right side, and 2+ on the left side.       Dorsalis pedis pulses are 2+ on the right side, and 2+ on the left side.  Pulmonary/Chest: Breath sounds normal. No respiratory distress. He has no wheezes. He has no rales.  Abdominal: Soft. Normal appearance and bowel sounds are normal. There is no hepatosplenomegaly. There is no tenderness.  Musculoskeletal: He exhibits no edema, tenderness or deformity.  Pt unable to move left sided extremities. Sensation is intact. He has 5/5 strength on the right.  Neurological: He is alert. He has normal sensation. He displays weakness (left sided).  Cranial nerves grossly normal although pt is unable to shrug left shoulder  Cranial Nerves: II: Pupils are equal, round, and reactive to light.   III,IV, VI: grossly normal EOM V: Facial sensation equal bilaterally VII: Facial movement is with left facial droop but able to puff out cheeks bilaterally VIII: hearing is intact to voice X: Uvula elevates symmetrically XI: Shoulder shrug is not symmetric, unable to on left XII: tongue is midline without atrophy or fasciculations.     Skin: Skin is warm and dry. He is not diaphoretic.  Nursing note and vitals reviewed.   Results for orders placed or performed  during the hospital encounter of 03/31/17 (from the past 48 hour(s))  Magnesium     Status: None   Collection Time: 04/04/17  5:38 AM  Result Value Ref Range   Magnesium 2.0 1.7 - 2.4 mg/dL  Phosphorus     Status: Abnormal   Collection Time: 04/04/17  5:38 AM  Result Value Ref Range   Phosphorus 1.8 (L) 2.5 - 4.6 mg/dL  CBC with Differential/Platelet     Status: Abnormal   Collection Time: 04/04/17  5:38 AM  Result Value Ref Range   WBC 9.1 4.0 - 10.5 K/uL   RBC 3.97 (L) 4.22 - 5.81 MIL/uL   Hemoglobin 12.2 (L) 13.0 - 17.0 g/dL   HCT 16.1 (L) 09.6 - 04.5 %   MCV 90.2 78.0 - 100.0 fL   MCH 30.7 26.0 - 34.0 pg   MCHC 34.1 30.0 - 36.0 g/dL   RDW 40.9 81.1 - 91.4 %   Platelets 114 (L) 150 - 400 K/uL  Comment: PLATELET COUNT CONFIRMED BY SMEAR   Neutrophils Relative % 78 %   Neutro Abs 7.1 1.7 - 7.7 K/uL   Lymphocytes Relative 12 %   Lymphs Abs 1.1 0.7 - 4.0 K/uL   Monocytes Relative 8 %   Monocytes Absolute 0.7 0.1 - 1.0 K/uL   Eosinophils Relative 2 %   Eosinophils Absolute 0.2 0.0 - 0.7 K/uL   Basophils Relative 0 %   Basophils Absolute 0.0 0.0 - 0.1 K/uL  Magnesium     Status: None   Collection Time: 04/05/17  3:56 AM  Result Value Ref Range   Magnesium 1.9 1.7 - 2.4 mg/dL  Phosphorus     Status: Abnormal   Collection Time: 04/05/17  3:56 AM  Result Value Ref Range   Phosphorus 2.3 (L) 2.5 - 4.6 mg/dL  CBC with Differential/Platelet     Status: Abnormal   Collection Time: 04/05/17  3:56 AM  Result Value Ref Range   WBC 8.2 4.0 - 10.5 K/uL   RBC 3.84 (L) 4.22 - 5.81 MIL/uL   Hemoglobin 12.2 (L) 13.0 - 17.0 g/dL   HCT 16.1 (L) 09.6 - 04.5 %   MCV 90.4 78.0 - 100.0 fL   MCH 31.8 26.0 - 34.0 pg   MCHC 35.2 30.0 - 36.0 g/dL   RDW 40.9 81.1 - 91.4 %   Platelets 142 (L) 150 - 400 K/uL   Neutrophils Relative % 72 %   Neutro Abs 5.9 1.7 - 7.7 K/uL   Lymphocytes Relative 18 %   Lymphs Abs 1.5 0.7 - 4.0 K/uL   Monocytes Relative 6 %   Monocytes Absolute 0.5 0.1 - 1.0  K/uL   Eosinophils Relative 4 %   Eosinophils Absolute 0.3 0.0 - 0.7 K/uL   Basophils Relative 0 %   Basophils Absolute 0.0 0.0 - 0.1 K/uL  Glucose, capillary     Status: None   Collection Time: 04/05/17  6:52 AM  Result Value Ref Range   Glucose-Capillary 86 65 - 99 mg/dL   Comment 1 Notify RN    Comment 2 Document in Chart    Dg Swallowing Func-speech Pathology  Result Date: 04/04/2017 Objective Swallowing Evaluation: Type of Study: MBS-Modified Barium Swallow Study Patient Details Name: ELLIJAH LEFFEL MRN: [id number] Date of Birth: 04/10/1941 Today's Date: 04/04/2017 Time: SLP Start Time (ACUTE ONLY): 0950-SLP Stop Time (ACUTE ONLY): 1017 SLP Time Calculation (min) (ACUTE ONLY): 27 min Past Medical History: Past Medical History: Diagnosis Date . No pertinent past medical history  Past Surgical History: Past Surgical History: Procedure Laterality Date . APPENDECTOMY   . IR ANGIO INTRA EXTRACRAN SEL COM CAROTID INNOMINATE UNI L MOD SED  03/31/2017 . IR INTRAVSC STENT CERV CAROTID W/O EMB-PROT MOD SED INC ANGIO  03/31/2017 . IR PERCUTANEOUS ART THROMBECTOMY/INFUSION INTRACRANIAL INC DIAG ANGIO  03/31/2017 . IR US GUIDE VASC ACCESS RIGHT  03/31/2017 . LAPAROSCOPIC APPENDECTOMY  06/21/2012  Procedure: APPENDECTOMY LAPAROSCOPIC;  Surgeon: Kandis Cocking, MD;  Location: [facility] ORS;  Service: General;  Laterality: N/A; . NO PAST SURGERIES   . RADIOLOGY WITH ANESTHESIA N/A 03/31/2017  Procedure: RADIOLOGY WITH ANESTHESIA;  Surgeon: Gilmer Mor, DO;  Location: [facility] OR;  Service: Anesthesiology;  Laterality: N/A; HPI: Mr.Nabor J Bramlettis a 76 y.o.malewith no significant past medical historypresenting with left hemiparesis.Hedidnot receive IV t-PA due to late presentation. Dx with hemorrhagic right MCA infarct. Status post IR thrombectomy and stenting 4/27, remained intubated post procedure. Extubated 4/29. Subjective: pt awake in chair Assessment / Plan /  Recommendation CHL IP CLINICAL IMPRESSIONS 04/04/2017  Clinical Impression Pt presents with moderately severe oropharyngeal dysphagia with sensorimotor deficits. Pt presents with decreased oral control resulting in delayed oral transiting and decreased bolus cohesion with compromise propulsion.  Pharyngeal swallow characterized by hypomotility allowing severe residuals without awareness and laryngeal penetration of thin and nectar to vocal folds. Various postures including head turn left, chin tuck, both combined, cues for effortful swallow did not fully clear residuals. Pt's cough is weak at this time which will decrease airway protection with po. Fortunately, pt can swallow on command and is motivated to improve.  Recommend NPO except single SMALL ice chips allowed to fully melt. If goal is to discharge hospital within the next few days, pt may benefit from consideration for longer term alternative means of nutrition as his level of dysphagia currently prevents adequate po intake. Using teach back, live video, educated pt to findings and goals of improved vallecular clearance (via "hock") and strengthening cough/voice to improve swallow function/airway protection.  SLP Visit Diagnosis Dysphagia, oropharyngeal phase (R13.12) Attention and concentration deficit following -- Frontal lobe and executive function deficit following -- Impact on safety and function Severe aspiration risk;Risk for inadequate nutrition/hydration;Moderate aspiration risk   CHL IP TREATMENT RECOMMENDATION 04/04/2017 Treatment Recommendations Therapy as outlined in treatment plan below   Prognosis 04/04/2017 Prognosis for Safe Diet Advancement Fair Barriers to Reach Goals Severity of deficits Barriers/Prognosis Comment -- CHL IP DIET RECOMMENDATION 04/04/2017 SLP Diet Recommendations NPO;Ice chips PRN after oral care Liquid Administration via -- Medication Administration Via alternative means Compensations Slow rate;Small sips/bites;Follow solids with liquid;Other (Comment);Effortful swallow  Postural Changes Seated upright at 90 degrees   CHL IP OTHER RECOMMENDATIONS 04/04/2017 Recommended Consults -- Oral Care Recommendations Oral care QID Other Recommendations Have oral suction available   CHL IP FOLLOW UP RECOMMENDATIONS 04/04/2017 Follow up Recommendations Skilled Nursing facility   [facility] IP FREQUENCY AND DURATION 04/04/2017 Speech Therapy Frequency (ACUTE ONLY) min 2x/week Treatment Duration 2 weeks      CHL IP ORAL PHASE 04/04/2017 Oral Phase Impaired Oral - Pudding Teaspoon -- Oral - Pudding Cup -- Oral - Honey Teaspoon Weak lingual manipulation;Reduced posterior propulsion;Decreased bolus cohesion;Premature spillage Oral - Honey Cup Weak lingual manipulation;Reduced posterior propulsion;Premature spillage;Decreased bolus cohesion Oral - Nectar Teaspoon Weak lingual manipulation;Decreased bolus cohesion;Premature spillage;Reduced posterior propulsion Oral - Nectar Cup Weak lingual manipulation;Reduced posterior propulsion;Premature spillage;Decreased bolus cohesion Oral - Nectar Straw Weak lingual manipulation;Reduced posterior propulsion;Decreased bolus cohesion;Delayed oral transit Oral - Thin Teaspoon Weak lingual manipulation;Reduced posterior propulsion;Decreased bolus cohesion;Premature spillage Oral - Thin Cup Weak lingual manipulation;Reduced posterior propulsion;Premature spillage;Decreased bolus cohesion Oral - Thin Straw -- Oral - Puree Weak lingual manipulation;Decreased bolus cohesion;Reduced posterior propulsion Oral - Mech Soft -- Oral - Regular Weak lingual manipulation;Reduced posterior propulsion;Premature spillage;Decreased bolus cohesion Oral - Multi-Consistency -- Oral - Pill -- Oral Phase - Comment --  CHL IP PHARYNGEAL PHASE 04/04/2017 Pharyngeal Phase Impaired Pharyngeal- Pudding Teaspoon -- Pharyngeal -- Pharyngeal- Pudding Cup -- Pharyngeal -- Pharyngeal- Honey Teaspoon Reduced epiglottic inversion;Reduced pharyngeal peristalsis;Reduced anterior laryngeal mobility;Reduced laryngeal  elevation;Pharyngeal residue - valleculae;Pharyngeal residue - pyriform;Reduced tongue base retraction Pharyngeal -- Pharyngeal- Honey Cup Reduced pharyngeal peristalsis;Reduced epiglottic inversion;Reduced anterior laryngeal mobility;Reduced laryngeal elevation;Reduced airway/laryngeal closure;Reduced tongue base retraction;Pharyngeal residue - valleculae;Pharyngeal residue - pyriform Pharyngeal -- Pharyngeal- Nectar Teaspoon Reduced pharyngeal peristalsis;Reduced epiglottic inversion;Reduced anterior laryngeal mobility;Reduced laryngeal elevation;Reduced airway/laryngeal closure;Reduced tongue base retraction;Pharyngeal residue - valleculae;Pharyngeal residue - pyriform Pharyngeal -- Pharyngeal- Nectar Cup Reduced laryngeal elevation;Reduced pharyngeal peristalsis;Reduced epiglottic inversion;Reduced anterior laryngeal  mobility;Reduced airway/laryngeal closure;Reduced tongue base retraction;Pharyngeal residue - valleculae;Pharyngeal residue - pyriform;Penetration/Aspiration during swallow Pharyngeal Material enters airway, remains ABOVE vocal cords and not ejected out Pharyngeal- Nectar Straw Reduced laryngeal elevation;Reduced anterior laryngeal mobility;Reduced epiglottic inversion;Reduced pharyngeal peristalsis;Reduced airway/laryngeal closure;Reduced tongue base retraction;Penetration/Aspiration during swallow Pharyngeal Material enters airway, CONTACTS cords and not ejected out Pharyngeal- Thin Teaspoon Reduced pharyngeal peristalsis;Reduced epiglottic inversion;Reduced anterior laryngeal mobility;Reduced laryngeal elevation;Reduced airway/laryngeal closure;Reduced tongue base retraction;Pharyngeal residue - valleculae;Pharyngeal residue - pyriform Pharyngeal -- Pharyngeal- Thin Cup Reduced pharyngeal peristalsis;Reduced epiglottic inversion;Reduced anterior laryngeal mobility;Reduced laryngeal elevation;Reduced airway/laryngeal closure;Penetration/Aspiration during swallow;Reduced tongue base  retraction;Pharyngeal residue - valleculae;Pharyngeal residue - pyriform Pharyngeal Material enters airway, CONTACTS cords and not ejected out Pharyngeal- Thin Straw -- Pharyngeal -- Pharyngeal- Puree Reduced pharyngeal peristalsis;Reduced epiglottic inversion;Reduced anterior laryngeal mobility;Reduced airway/laryngeal closure;Reduced tongue base retraction;Pharyngeal residue - valleculae Pharyngeal -- Pharyngeal- Mechanical Soft -- Pharyngeal -- Pharyngeal- Regular Reduced pharyngeal peristalsis;Reduced epiglottic inversion;Reduced anterior laryngeal mobility;Reduced laryngeal elevation;Reduced airway/laryngeal closure;Reduced tongue base retraction;Pharyngeal residue - valleculae Pharyngeal -- Pharyngeal- Multi-consistency -- Pharyngeal -- Pharyngeal- Pill -- Pharyngeal -- Pharyngeal Comment chin tuck, head turn left with and without chin tuck did not clear residuals, cues to hock not effective to clear, effortful dry swallow inconsistently effective  CHL IP CERVICAL ESOPHAGEAL PHASE 04/04/2017 Cervical Esophageal Phase WFL Pudding Teaspoon -- Pudding Cup -- Honey Teaspoon -- Honey Cup -- Nectar Teaspoon -- Nectar Cup -- Nectar Straw -- Thin Teaspoon -- Thin Cup -- Thin Straw -- Puree -- Mechanical Soft -- Regular -- Multi-consistency -- Pill -- Cervical Esophageal Comment -- No flowsheet data found. Donavan Burnet, MS [facility] SLP [phone]                 Assessment/Plan Consulted for G tube placement - S/P stroke and left hemiparesis - Speech is recommending NPO - cortrak currently in place - neurology is recommending G tube, daughter and pt are amenable to placement  Will discuss timing of G tube with MD. Daughter at bedside and answered questions.   Jerre Simon, [facility] Surgery 04/05/2017, 4:26 PM Pager: [phone] Consults: [phone] Mon-Fri 7:00 am-4:30 pm Sat-Sun 7:00 am-11:30 am

## 2017-04-05 NOTE — Progress Notes (Signed)
  Speech Language Pathology Treatment: Dysphagia  Patient Details Name: Matthew Meza MRN: [id number] DOB: 03-03-1941 Today's Date: 04/05/2017 Time: 4098-1191 SLP Time Calculation (min) (ACUTE ONLY): 15 min  Assessment / Plan / Recommendation Clinical Impression  Pt awake in bed, dried secretions on posterior tongue without awareness/sensation.    Pt stated he "coughed up something earlier" but did not expectorate.  Provided him with washcloth to expectorate secretions.  Pt continues with dysarthria but can improve on speech intelligibility with cues/effort.    Provided pt with two small single ice chips after oral care provided for functional dysphagia treatment.  Pt required max cues to attend to task and swallowing was effortful.  WEAK cough noted immediately post-swallow concerning for aspiration.   Fatigue, lethargy impact pt's participation.    Given pt current swallow ability/mental ability - suspect pt will need LONG TERM alternative means of nutrition as do not anticipate swallow ability to become functional during this acute stay.      HPI HPI: Mr.Oden J Bramlettis a 76 y.o.malewith no significant past medical historypresenting with left hemiparesis.Hedidnot receive IV t-PA due to late presentation. Dx with hemorrhagic right MCA infarct. Status post IR thrombectomy and stenting 4/27, remained intubated post procedure. Extubated 4/29.      SLP Plan  Continue with current plan of care       Recommendations  Diet recommendations: NPO (single small ice chips only after oral care) Medication Administration: Via alternative means                Oral Care Recommendations: Oral care QID Follow up Recommendations: Skilled Nursing facility SLP Visit Diagnosis: Dysphagia, unspecified (R13.10) Plan: Continue with current plan of care       GO                Donavan Burnet, MS [facility] SLP [phone]

## 2017-04-05 NOTE — Progress Notes (Signed)
RN notified by patient's daughter that patient stated he did not know where his belongings were. Patient stated that he had a cell phone and khaki pants and a shirt when he came into the hospital. Patient had been on unit 2W when he had been intubated. RN called up to floor and was told that patient's belongings were not in the soiled utility and that the NT that brought patient down on 04/03/17 when he was transferred to room 26M sent patient with a patient belonging bag with all his belongings. RN, charge nurse, and patient's daughter assessed room twice and could not find patient's belongings. Charge nurse attempting to follow up with nurse that admitted patient on unit 26M on the day he was transferred.

## 2017-04-05 NOTE — Clinical Social Work Note (Signed)
CSW spoke with MD this am and he predicts weekend d/c. Pt will be getting a feeding tube. [employer] auth only good for 48 hours. CSW will submit for auth closer to d/c.   [place], [place] [phone]

## 2017-04-06 LAB — GLUCOSE, CAPILLARY
Glucose-Capillary: 87 mg/dL (ref 65–99)
Glucose-Capillary: 118 mg/dL — ABNORMAL HIGH (ref 65–99)
Glucose-Capillary: 142 mg/dL — ABNORMAL HIGH (ref 65–99)

## 2017-04-06 MED ORDER — JEVITY 1.2 CAL PO LIQD
1000.0000 mL | ORAL | Status: DC
  Administered 2017-04-06 – 2017-04-11 (×7): 1000 mL
  Filled 2017-04-06 (×14): qty 1000

## 2017-04-06 MED ORDER — METOCLOPRAMIDE HCL 5 MG/5ML PO SOLN
10.0000 mg | Freq: Once | ORAL | Status: AC
  Administered 2017-04-06: 10 mg
  Filled 2017-04-06 (×2): qty 10

## 2017-04-06 MED ORDER — CEFAZOLIN SODIUM-DEXTROSE 2-4 GM/100ML-% IV SOLN
2.0000 g | INTRAVENOUS | Status: AC
  Administered 2017-04-07: 2 g via INTRAVENOUS
  Filled 2017-04-06: qty 100

## 2017-04-06 MED ORDER — METOCLOPRAMIDE HCL 10 MG/10ML PO SOLN
10.0000 mg | Freq: Three times a day (TID) | ORAL | Status: DC
  Administered 2017-04-06 – 2017-04-12 (×17): 10 mg
  Filled 2017-04-06 (×18): qty 10

## 2017-04-06 NOTE — Progress Notes (Signed)
Occupational Therapy Treatment Patient Details Name: Matthew Meza MRN: [id number] DOB: 1941/06/19 Today's Date: 04/06/2017    History of present illness 76 yo admitted with Rt MCA CVA  emergent stent system of the right ICA for treatment of dissection and intraluminal thrombus, and mechanical thrombectomy of the right MCA and carotid siphon, VDRF 4/27-4/29. No significant PMHx. Coretrac placed 5/3.    OT comments  Pt seen as cotreat with PT with focus on facilitating movement patterns through supine - sidelying to sitting EOB with increasing core trunk control and midline orientation/postural control. Pt demonstrates increased awareness of L side of body and will atten to L side on environment with mod VC. Pt's poor sustained attention affecting postural control. Pt pushes L due to poor spatial awareness/deficits with midline orientation. Although pt requries vc to keep eyes open, he is joking appropriately throughout session. No active movement observed LUE. Keep L UE supported on pillows to reduce dependent edema and improve proper positioning. Will continue to follow acutely and recommend SNF for rehab.   Follow Up Recommendations  SNF;Supervision/Assistance - 24 hour    Equipment Recommendations  Other (comment) (TBA at SNF)    Recommendations for Other Services      Precautions / Restrictions Precautions Precautions: Fall Precaution Comments: left hemiplegia, neglect Restrictions Weight Bearing Restrictions: No       Mobility Bed Mobility Overal bed mobility: Needs Assistance Bed Mobility: Rolling;Sidelying to Sit;Sit to Sidelying Rolling: Min assist Sidelying to sit: Mod assist;+2 for physical assistance Supine to sit: Mod assist;+2 for physical assistance   Sit to sidelying: Max assist;+2 for physical assistance General bed mobility comments: facilitation with rolling and pushing sidelying to sit  Transfers Overall transfer level: Needs assistance Equipment used: 2  person hand held assist Transfers: Sit to/from Stand Sit to Stand: +2 physical assistance;Max assist         General transfer comment: L lat lean.     Balance Overall balance assessment: Needs assistance Sitting-balance support: Single extremity supported;Feet supported Sitting balance-Leahy Scale: Poor Sitting balance - Comments: pt fluctuating with requiring mod A to min A, requiring multimodal cueing to work on achieving midline. pt with preference to push towards his L. Postural control: Posterior lean;Left lateral lean Standing balance support: Bilateral upper extremity supported Standing balance-Leahy Scale: Zero Standing balance comment: pt requires mod A x2 and unable to achieve full erect standing. pt performed sit<> stand x3 from bed                           ADL either performed or assessed with clinical judgement   ADL   Eating/Feeding: NPO   Grooming: Moderate assistance Grooming Details (indicate cue type and reason): able to wash face         Upper Body Dressing : Maximal assistance Upper Body Dressing Details (indicate cue type and reason): pt assisted with donning clean gown                 Functional mobility during ADLs: +2 for physical assistance;Maximal assistance       Vision   Vision Assessment?: Vision impaired- to be further tested in functional context Additional Comments: Apparent L field cut. difficulty maintinaing visual attention   Perception     Praxis      Cognition Arousal/Alertness: Lethargic Behavior During Therapy: Flat affect Overall Cognitive Status: Impaired/Different from baseline Area of Impairment: Orientation;Attention;Memory;Safety/judgement;Awareness;Problem solving  Orientation Level: Disoriented to;Time Current Attention Level: Sustained Memory: Decreased short-term memory Following Commands: Follows one step commands consistently Safety/Judgement: Decreased awareness of  safety;Decreased awareness of deficits Awareness: Emergent Problem Solving: Slow processing;Decreased initiation;Difficulty sequencing;Requires verbal cues;Requires tactile cues General Comments: decreased attention to task with inability to perform R UE motor task while maintaining trunk control        Exercises Other Exercises Other Exercises: LUE PROM Other Exercises: Pt positionined on L hemi side for weight bearing/snsory input. pillow laced under L hand to elevate and reduce dependent edema   Shoulder Instructions       General Comments  LUE - Brunstrom level I hand/arm.no active shoulder elevation. No active movement observed. Pt with increased awareness of LUE.  Able to facilitate rolling toward R side with facilitation behind L scapula.    Pertinent Vitals/ Pain       Pain Assessment: Faces Faces Pain Scale: No hurt  Home Living                                          Prior Functioning/Environment              Frequency  Min 2X/week        Progress Toward Goals  OT Goals(current goals can now be found in the care plan section)  Progress towards OT goals: Progressing toward goals  Acute Rehab OT Goals Patient Stated Goal: walk and put my pants on  OT Goal Formulation: With patient Time For Goal Achievement: 04/17/17 Potential to Achieve Goals: Good ADL Goals Pt Will Perform Grooming: with supervision;sitting;with set-up Pt Will Perform Upper Body Bathing: with min assist;sitting Pt Will Transfer to Toilet: with +2 assist;with mod assist;bedside commode Additional ADL Goal #1: Pt will identify 3/3 targets in L visual field with min vc in  nondistracting environment Additional ADL Goal #2: Pt will maintain midline postural control sitting EOB x 5 min with min vc in preparation for ADL tasks.  Plan Discharge plan remains appropriate;Frequency remains appropriate    Co-evaluation    PT/OT/SLP Co-Evaluation/Treatment: Yes Reason for  Co-Treatment: Complexity of the patient's impairments (multi-system involvement);Necessary to address cognition/behavior during functional activity;For patient/therapist safety;To address functional/ADL transfers PT goals addressed during session: Mobility/safety with mobility;Balance;Strengthening/ROM OT goals addressed during session: ADL's and self-care;Strengthening/ROM      AM-PAC PT "6 Clicks" Daily Activity     Outcome Measure   Help from another person eating meals?: Total Help from another person taking care of personal grooming?: A Lot Help from another person toileting, which includes using toliet, bedpan, or urinal?: Total Help from another person bathing (including washing, rinsing, drying)?: A Lot Help from another person to put on and taking off regular upper body clothing?: A Lot Help from another person to put on and taking off regular lower body clothing?: Total 6 Click Score: 9    End of Session Equipment Utilized During Treatment: Gait belt;Oxygen  OT Visit Diagnosis: Other abnormalities of gait and mobility (R26.89);Muscle weakness (generalized) (M62.81);Low vision, both eyes (H54.2);Other symptoms and signs involving cognitive function;Cognitive communication deficit (R41.841);Hemiplegia and hemiparesis Symptoms and signs involving cognitive functions: Cerebral infarction Hemiplegia - Right/Left: Left Hemiplegia - dominant/non-dominant: Non-Dominant Hemiplegia - caused by: Cerebral infarction   Activity Tolerance Patient tolerated treatment well   Patient Left in bed;with call bell/phone within reach;with bed alarm set;with SCD's reapplied   Nurse Communication Mobility status  Time: [id number] OT Time Calculation (min): 41 min  Charges: OT General Charges $OT Visit: 1 Procedure OT Treatments $Self Care/Home Management : 8-22 mins $Neuromuscular Re-education: 8-22 mins  [facility] Mateusz Neilan, OT/L  [phone] 04/06/2017   Lexy Meininger,HILLARY 04/06/2017, 5:21  PM

## 2017-04-06 NOTE — Progress Notes (Signed)
Patient ID: Matthew Meza, male   DOB: 01-05-1941, 76 y.o.   MRN: [id number] 6 Days Post-Op  Subjective: CC does not offer complaint  Objective: Vital signs in last 24 hours: Temp:  [97.6 F (36.4 C)-98.6 F (37 C)] 98.2 F (36.8 C) (05/03 0610) Pulse Rate:  [53-63] 54 (05/03 0610) Resp:  [18-20] 18 (05/03 0610) BP: (143-152)/(65-69) 148/68 (05/03 0610) SpO2:  [95 %-100 %] 95 % (05/03 0610) Last BM Date: 04/05/17  Intake/Output from previous day: 05/02 0701 - 05/03 0700 In: -  Out: 652 [Urine:650; Stool:2] Intake/Output this shift: No intake/output data recorded.  General appearance: cooperative Nose: CorTrak Resp: clear to auscultation bilaterally GI: soft, NT, ND  Neuro: sleepy but arouses, L hemiparesis  Lab Results: CBC   Recent Labs  04/04/17 0538 04/05/17 0356  WBC 9.1 8.2  HGB 12.2* 12.2*  HCT 35.8* 34.7*  PLT 114* 142*   Anti-infectives: Anti-infectives    Start     Dose/Rate Route Frequency Ordered Stop   03/31/17 1118  ceFAZolin (ANCEF) 2-4 GM/100ML-% IVPB    Comments:  Dhers, Patricia   : cabinet override      03/31/17 1118 03/31/17 2329      Assessment/Plan: Dysphagia secondary to stroke - for PEG placement tomorrow with anesthesia. OK for tube feeds today but need to hold at midnight.  LOS: 6 days    Violeta GelinasBurke Duglas Heier, MD, MPH, FACS Trauma: [phone] General Surgery: [phone]  04/06/2017

## 2017-04-06 NOTE — Progress Notes (Signed)
Physical Therapy Treatment Patient Details Name: Matthew Meza MRN: [id number] DOB: 03-05-1941 Today's Date: 04/06/2017    History of Present Illness 76 yo admitted with Rt MCA CVA  emergent stent system of the right ICA for treatment of dissection and intraluminal thrombus, and mechanical thrombectomy of the right MCA and carotid siphon, VDRF 4/27-4/29. No significant PMHx     PT Comments    Pt presented supine in bed with HOB elevated, awake and willing to participate in therapy session. Pt lethargic throughout but able to participate in bed mobility, sitting balance activities (see OT note), and sit<>stand from bed x3 with mod A x2. Pt would continue to benefit from skilled physical therapy services at this time while admitted and after d/c to address the below listed limitations in order to improve overall safety and independence with functional mobility.    Follow Up Recommendations  SNF;Supervision/Assistance - 24 hour     Equipment Recommendations  None recommended by PT;Other (comment) (deferred to next venue)    Recommendations for Other Services Speech consult     Precautions / Restrictions Precautions Precautions: Fall Precaution Comments: left hemiplegia, neglect Restrictions Weight Bearing Restrictions: No    Mobility  Bed Mobility Overal bed mobility: Needs Assistance Bed Mobility: Rolling;Sidelying to Sit;Sit to Sidelying Rolling: Mod assist Sidelying to sit: Mod assist;+2 for physical assistance     Sit to sidelying: Mod assist;+2 for physical assistance General bed mobility comments: increased time, facilitation for rolling towards his R at L UE and shoulder with good core activation. pt able to use R UE on bed rail to roll towards his L. Assist to elevate trunk to achieve sitting EOB and assist at bilateral LEs and trunk to return to sidelying.  Transfers Overall transfer level: Needs assistance Equipment used: 2 person hand held assist Transfers: Sit  to/from Stand Sit to Stand: +2 physical assistance;Mod assist         General transfer comment: cueing for improved trunk and head position, use of gait belt and bed pad, mod A x2 to rise but pt unable to achieve completely erect standing position  Ambulation/Gait                 Stairs            Wheelchair Mobility    Modified Rankin (Stroke Patients Only) Modified Rankin (Stroke Patients Only) Pre-Morbid Rankin Score: No symptoms Modified Rankin: Severe disability     Balance Overall balance assessment: Needs assistance Sitting-balance support: Single extremity supported;Feet supported Sitting balance-Leahy Scale: Poor Sitting balance - Comments: pt fluctuating with requiring mod A to min A, requiring multimodal cueing to work on achieving midline. pt with preference to push towards his L. Postural control: Posterior lean;Left lateral lean Standing balance support: Bilateral upper extremity supported Standing balance-Leahy Scale: Zero Standing balance comment: pt requires mod A x2 and unable to achieve full erect standing. pt performed sit<> stand x3 from bed                            Cognition Arousal/Alertness: Lethargic Behavior During Therapy: Flat affect Overall Cognitive Status: Impaired/Different from baseline Area of Impairment: Orientation;Memory;Following commands;Safety/judgement;Awareness;Problem solving                 Orientation Level: Disoriented to;Time;Situation Current Attention Level: Sustained Memory: Decreased short-term memory Following Commands: Follows one step commands inconsistently;Follows one step commands with increased time Safety/Judgement: Decreased awareness of safety;Decreased awareness of deficits  Awareness: Emergent Problem Solving: Slow processing;Decreased initiation;Difficulty sequencing;Requires verbal cues;Requires tactile cues General Comments: decreased attention to task with inability to  perform R UE motor task while maintaining trunk control      Exercises      General Comments        Pertinent Vitals/Pain Pain Assessment: No/denies pain    Home Living                      Prior Function            PT Goals (current goals can now be found in the care plan section) Acute Rehab PT Goals PT Goal Formulation: With patient Time For Goal Achievement: 04/17/17 Potential to Achieve Goals: Fair Progress towards PT goals: Progressing toward goals    Frequency    Min 4X/week      PT Plan Current plan remains appropriate    Co-evaluation PT/OT/SLP Co-Evaluation/Treatment: Yes Reason for Co-Treatment: Complexity of the patient's impairments (multi-system involvement);For patient/therapist safety PT goals addressed during session: Mobility/safety with mobility;Balance;Strengthening/ROM        AM-PAC PT "6 Clicks" Daily Activity  Outcome Measure  Difficulty turning over in bed (including adjusting bedclothes, sheets and blankets)?: Total Difficulty moving from lying on back to sitting on the side of the bed? : Total Difficulty sitting down on and standing up from a chair with arms (e.g., wheelchair, bedside commode, etc,.)?: Total Help needed moving to and from a bed to chair (including a wheelchair)?: Total Help needed walking in hospital room?: Total Help needed climbing 3-5 steps with a railing? : Total 6 Click Score: 6    End of Session Equipment Utilized During Treatment: Gait belt;Oxygen Activity Tolerance: Patient tolerated treatment well;Patient limited by lethargy Patient left: in bed;with call bell/phone within reach;with bed alarm set;with SCD's reapplied Nurse Communication: Mobility status;Need for lift equipment PT Visit Diagnosis: Other abnormalities of gait and mobility (R26.89);Unsteadiness on feet (R26.81);Hemiplegia and hemiparesis Hemiplegia - Right/Left: Left Hemiplegia - dominant/non-dominant: Non-dominant Hemiplegia -  caused by: Cerebral infarction     Time: [id number] PT Time Calculation (min) (ACUTE ONLY): 46 min  Charges:  $Therapeutic Activity: 8-22 mins                    G Codes:       WalnutJennifer Gerrica Cygan, [place]PT, DPT [phone]    Alessandra BevelsJennifer M Jaesean Litzau 04/06/2017, 4:42 PM

## 2017-04-06 NOTE — Progress Notes (Signed)
Patient ID: Matthew Meza, male   DOB: 09-16-1941, 76 y.o.   MRN: [id number] I spoke with his daughter at the bedside and discussed the planned PEG placement including the risks and benefits. She is agreeable.  Matthew GelinasBurke Daryon Remmert, MD, MPH, FACS Trauma: [phone] General Surgery: [phone]

## 2017-04-06 NOTE — Progress Notes (Addendum)
Nutrition Consult/Follow Up  DOCUMENTATION CODES:   Obesity unspecified  INTERVENTION:    Initiate Jevity 1.2 formula at 25 ml/hr and increase by 10 ml every 4 hours to goal rate of 75 ml/hr  TF regimen to provide 2160 kcals, 100 gm protein, 1453 ml of free water  NUTRITION DIAGNOSIS:   Inadequate oral intake related to inability to eat as evidenced by NPO status, ongoing   GOAL:   Patient will meet greater than or equal to 90% of their needs, progressing  MONITOR:   TF tolerance, Labs, Weight trends, I & O's  ASSESSMENT:   76 year old male presented with right MCA CVA s/p revascularization, required intubation.   Pt s/p procedure 4/27: IR PERCUTANEOUS ART THROMBECTOMY/INFUSION INTRACRANIAL INC DIAG ANGIO   Pt extubated 4/29. S/p MBSS 5/1.  SLP recommending continued NPO status. CORTRAK small bore feeding tube placed 5/2.  Medications reviewed and include Reglan.  Labs reviewed.  Phos 2.3 (L). Plan is for PEG tube placement tomorrow per Trauma. CBG 86-87.  Diet Order:  Diet NPO time specified  Skin:  Reviewed, no issues  Last BM:  5/2  Height:   Ht Readings from Last 1 Encounters:  03/31/17 5' 6.93" (1.7 m)   Weight:   Wt Readings from Last 1 Encounters:  04/05/17 207 lb 1.6 oz (93.9 kg)   Ideal Body Weight:  67.08 kg  BMI:  Body mass index is 32.51 kg/m.  Estimated Nutritional Needs:   Kcal:  2000-2200  Protein:  100-110 gm  Fluid:  2.0-2.2 L  EDUCATION NEEDS:   No education needs identified at this time  Maureen ChattersKatie Enriqueta Augusta, RD, LDN Pager #: [phone] After-Hours Pager #: [phone]

## 2017-04-06 NOTE — Progress Notes (Signed)
Clinical Social Worker [facility](Kirsten). Kirsten stated patient has a bed waiting for him once peg has ben placed and once medically stable. CSW remains available for support and discharge needs.  Marrianne MoodAshley Caryssa Elzey, MSW,  [employer] IncLCSWA [phone]

## 2017-04-06 NOTE — Progress Notes (Signed)
STROKE TEAM PROGRESS NOTE   HISTORY OF PRESENT ILLNESS (per record) Matthew Meza is a 76 y.o. male who was last in his normal state of health definitely yesterday evening when he interacted with someone at his facility. 4 AM, he got up and went to the bathroom and felt normal and that time. When he tried it up for more to the bathroom, however, he fell and was unable to get up. He was then unable to get to a phone for about 6 hours and once he finally did, he called 911 and was brought in as a code stroke.   LKW: 4/26 evening tpa given?: no, outside of window Modified Rankin Score: 0    SUBJECTIVE (INTERVAL HISTORY) His daughter is present. He  Is having more hiccupsThe patient is able to follow   commands.Speech therapy recommends NPO.Trauma team consulted for PEG tube which will be placed tomorrow  OBJECTIVE Temp:  [97.6 F (36.4 C)-98.9 F (37.2 C)] 98.9 F (37.2 C) (05/03 1451) Pulse Rate:  [53-71] 71 (05/03 1451) Cardiac Rhythm: Sinus bradycardia (05/03 0700) Resp:  [16-18] 16 (05/03 1451) BP: (143-152)/(66-69) 143/66 (05/03 1451) SpO2:  [95 %-100 %] 97 % (05/03 1451)  CBC:   Recent Labs Lab 04/04/17 0538 04/05/17 0356  WBC 9.1 8.2  NEUTROABS 7.1 5.9  HGB 12.2* 12.2*  HCT 35.8* 34.7*  MCV 90.2 90.4  PLT 114* 142*    Basic Metabolic Panel:   Recent Labs Lab 04/01/17 0441 04/02/17 0055  04/04/17 0538 04/05/17 0356  NA 139 140  --   --   --   K 4.3 3.7  --   --   --   CL 110 111  --   --   --   CO2 21* 22  --   --   --   GLUCOSE 184* 164*  --   --   --   BUN 13 20  --   --   --   CREATININE 1.18 1.14  --   --   --   CALCIUM 7.7* 7.7*  --   --   --   MG 1.4*  --   < > 2.0 1.9  PHOS 2.0*  --   < > 1.8* 2.3*  < > = values in this interval not displayed.  Lipid Panel:     Component Value Date/Time   CHOL 148 04/01/2017 0442   TRIG 109 04/01/2017 0442   HDL 27 (L) 04/01/2017 0442   CHOLHDL 5.5 04/01/2017 0442   VLDL 22 04/01/2017 0442   LDLCALC  99 04/01/2017 0442   HgbA1c:  Lab Results  Component Value Date   HGBA1C 6.2 (H) 04/01/2017   Urine Drug Screen:     Component Value Date/Time   LABOPIA NONE DETECTED 12/13/2009 2102   COCAINSCRNUR NONE DETECTED 12/13/2009 2102   LABBENZ NONE DETECTED 12/13/2009 2102   AMPHETMU NONE DETECTED 12/13/2009 2102   THCU NONE DETECTED 12/13/2009 2102   LABBARB  12/13/2009 2102    NONE DETECTED        DRUG SCREEN FOR MEDICAL PURPOSES ONLY.  IF CONFIRMATION IS NEEDED FOR ANY PURPOSE, NOTIFY LAB WITHIN 5 DAYS.        LOWEST DETECTABLE LIMITS FOR URINE DRUG SCREEN Drug Class       Cutoff (ng/mL) Amphetamine      1000 Barbiturate      200 Benzodiazepine   200 Tricyclics       300 Opiates  300 Cocaine          300 THC              50    Alcohol Level     Component Value Date/Time   [facility]  12/13/2009 2140    <5        LOWEST DETECTABLE LIMIT FOR SERUM ALCOHOL IS 5 mg/dL FOR MEDICAL PURPOSES ONLY    IMAGING  Ct Angio Head W Or Wo Contrast Ct Angio Neck W Or Wo Contrast Ct Cerebral Perfusion W Contrast 03/31/2017 1. Positive for emergent large vessel occlusion, and favorable CT perfusion findings for endovascular re-perfusion.  2. Occlusion of the Right ICA just beyond its origin. Reconstitution of the Right ICA terminus beginning at the anterior genu (perhaps via the right ophthalmic artery). But superimposed Right MCA mid M1 segment occlusion.  3. CT perfusion indicates a core infarct of 40 mL in the Right MCA territory and right hemisphere penumbra of 146 mL.  4. The above was relayed to Dr. Ritta Slot beginning at 1059 hours.  5. Stenosis of the right ACA origin, but the right A1 segment may be non dominant. Right A2 and distal ACA enhancement is within normal limits. Normal left MCA and left ACA.  6. Aortic arch and bilateral carotid atherosclerosis without additional stenosis.  7. Mild left vertebral artery origin stenosis due to soft plaque. Otherwise  negative posterior circulation.  8. Calcified coronary artery atherosclerosis.   Ct Head Wo Contrast 04/01/2017 Developing bland and hemorrhagic infarction in the RIGHT hemisphere corresponding roughly to the area of infarcted core of brain on recent CT perfusion.    Ct Head Wo Contrast 03/31/2017 1. Extensive areas of hyperdensity involving the basal ganglia and right MCA territory cortex likely reflecting luxury perfusion associated with the areas of acute infarction following revascularization. Hemorrhage is not entirely excluded, but felt to be a minority of the hyperdensity visualized. The areas largely correspond to the areas of core infarction on the CTP study.  2. A tiny amount of subarachnoid hemorrhage may be present without intraventricular hemorrhage.      Ir US Guide Vasc Access Right 04/01/2017 IMPRESSION: Status post cerebral angiogram and treatment of tandem occlusion of the right ICA and right M1, with emergent stent system of the right ICA for treatment of dissection and intraluminal thrombus, and mechanical thrombectomy of the right MCA and carotid siphon, with restoration of TICI 3 flow. Deployment of Exoseal for hemostasis. Signed, Yvone Neu. Loreta Ave DO [facility] Radiology PLAN: Patient is stable to CT department for head CT Admit to neuro ICU. First 24 hours target systolic blood pressure of 120-140. Right hip straight for 6 hours status post removal of 8 French sheath. Electronically Signed   By: Gilmer Mor D.O.   On: 04/01/2017 11:23     Dg Chest Port 1 View 04/01/2017 Early bibasilar atelectasis similar to priors.   Dg Chest Port 1 View 03/31/2017 1. Unremarkable positioning of endotracheal and orogastric tubes. 2. Low volumes with mild atelectasis.     Ct Head Code Stroke W/o Cm 03/31/2017 1. Emergent right MCA large vessel occlusion suspected. No associated hemorrhage or mass effect.  2. ASPECTS is 9.     PHYSICAL EXAM Elderly Caucasian male  .  Marland Kitchen Afebrile. Head is nontraumatic. Neck is supple without bruit.    Cardiac exam no murmur or gallop. Lungs are clear to auscultation. Distal pulses are well felt.  Neurological Exam :  Awake and  follows midline and commands on right side  .Marland Kitchen. Left gaze preference but able to look to right. Blinks to threat bilaterally. Fundi not visualized.left lower face weakness. Tongue midline. Left hemiplegia but able to withdraw to pain lower extremity more than upper extremity. Purposeful antigravity movements on the right side. Left plantar upgoing right downgoing. ASSESSMENT/PLAN Mr. Kem BoroughsRaymond J Yost is a 76 y.o. male with no significant past medical history presenting with left hemiparesis. He did not receive IV t-PA due to late presentation. Status post IR thrombectomy and stenting as above.  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 Superimposed Right MCA mid M1 segment occlusion  Carotid Doppler - CTA neck 2D Echo - Normal LV function; trace MR and TR; mildly elevated pulmonary    pressure.  LDL - 99  HgbA1c 6.2   VTE prophylaxis - SCDs  Diet NPO time specified  No antithrombotic prior to admission, now on aspirin 81 mg daily (Plavix discontinued secondary to hemorrhage)  Patient will be counseled to be compliant with his antithrombotic medications  Ongoing aggressive stroke risk factor management  Therapy recommendations: pending  Disposition: Pending  Hypertension  Blood pressure tends to run low  Permissive hypertension (OK if < 220/120) but gradually normalize in 5-7 days  Long-term BP goal normotensive  Hyperlipidemia  Home meds: No lipid lowering medications prior to admission  LDL 99, goal < 70  Start Lipitor 40 mg daily when access is  available  Continue statin at discharge    Other Stroke Risk Factors  Advanced age  The patient quit smoking 21 years ago.  Obesity, Body mass index is 32.51 kg/m., recommend weight loss, diet and exercise as appropriate   Family hx stroke (mother)    Other Active Problems  Hemorrhagic conversion -> discontinue Plavix     Electrolyte abnormalities - recheck in a.m.  Hyperglycemia - await hemoglobin A1c  Post stroke dysphagia  Hospital day # 6 Plan try Reglan for  Hiccups and if it is effective give 10 mg 3 times daily. Discuss with daughter at the bedside who is agreeable with plan for PEG tube to be placed tomorrow by the trauma team.  Delia HeadyPramod Sethi, MD Medical Director [facility] Pager: [phone] 04/06/2017 3:22 PM    To contact Stroke Continuity provider, please refer to [email or web address]. After hours, contact General Neurology

## 2017-04-07 ENCOUNTER — Inpatient Hospital Stay (HOSPITAL_COMMUNITY): Payer: Medicare HMO | Admitting: Certified Registered Nurse Anesthetist

## 2017-04-07 ENCOUNTER — Encounter (HOSPITAL_COMMUNITY)
Admission: EM | Disposition: A | Discharge status: Skilled Nursing Facility | Payer: Self-pay | Source: Home / Self Care | Attending: Neurology

## 2017-04-07 ENCOUNTER — Encounter (HOSPITAL_COMMUNITY): Payer: Self-pay | Admitting: *Deleted

## 2017-04-07 HISTORY — PX: PEG PLACEMENT: SHX5437

## 2017-04-07 HISTORY — PX: ESOPHAGOGASTRODUODENOSCOPY: SHX5428

## 2017-04-07 LAB — BASIC METABOLIC PANEL
ANION GAP: 10 (ref 5–15)
BUN: 16 mg/dL (ref 6–20)
CHLORIDE: 107 mmol/L (ref 101–111)
CO2: 22 mmol/L (ref 22–32)
Calcium: 7.8 mg/dL — ABNORMAL LOW (ref 8.9–10.3)
Creatinine, Ser: 0.94 mg/dL (ref 0.61–1.24)
GFR calc non Af Amer: >60 mL/min (ref >60)
GLUCOSE: 110 mg/dL — AB (ref 65–99)
Potassium: 3.2 mmol/L — ABNORMAL LOW (ref 3.5–5.1)
Sodium: 139 mmol/L (ref 135–145)

## 2017-04-07 LAB — GLUCOSE, CAPILLARY
GLUCOSE-CAPILLARY: 174 mg/dL — AB (ref 65–99)
Glucose-Capillary: 135 mg/dL — ABNORMAL HIGH (ref 65–99)
Glucose-Capillary: 119 mg/dL — ABNORMAL HIGH (ref 65–99)
Glucose-Capillary: 125 mg/dL — ABNORMAL HIGH (ref 65–99)
Glucose-Capillary: 120 mg/dL — ABNORMAL HIGH (ref 65–99)

## 2017-04-07 SURGERY — EGD (ESOPHAGOGASTRODUODENOSCOPY)
Anesthesia: Monitor Anesthesia Care

## 2017-04-07 MED ORDER — LIDOCAINE 2% (20 MG/ML) 5 ML SYRINGE
INTRAMUSCULAR | Status: DC | PRN
  Administered 2017-04-07: 30 mg via INTRAVENOUS

## 2017-04-07 MED ORDER — PROPOFOL 10 MG/ML IV BOLUS
INTRAVENOUS | Status: DC | PRN
  Administered 2017-04-07 (×2): 20 mg via INTRAVENOUS

## 2017-04-07 MED ORDER — FENTANYL CITRATE (PF) 100 MCG/2ML IJ SOLN: 0.5000 ug/kg | INTRAMUSCULAR | Status: DC | PRN

## 2017-04-07 MED ORDER — PROPOFOL 500 MG/50ML IV EMUL
INTRAVENOUS | Status: DC | PRN
  Administered 2017-04-07: 75 ug/kg/min via INTRAVENOUS

## 2017-04-07 MED ORDER — VALPROATE SODIUM 500 MG/5ML IV SOLN
500.0000 mg | Freq: Three times a day (TID) | INTRAVENOUS | Status: DC
  Administered 2017-04-07 (×2): 500 mg via INTRAVENOUS
  Filled 2017-04-07 (×4): qty 5

## 2017-04-07 MED ORDER — LACTATED RINGERS IV SOLN
INTRAVENOUS | Status: DC | PRN
  Administered 2017-04-07: 11:00:00 via INTRAVENOUS

## 2017-04-07 MED ORDER — LACTATED RINGERS IV SOLN
Freq: Once | INTRAVENOUS | Status: AC
  Administered 2017-04-07: 1000 mL via INTRAVENOUS

## 2017-04-07 NOTE — Transfer of Care (Signed)
Immediate Anesthesia Transfer of Care Note  Patient: Matthew Meza  Procedure(s) Performed: Procedure(s): ESOPHAGOGASTRODUODENOSCOPY (EGD) (N/A) PERCUTANEOUS ENDOSCOPIC GASTROSTOMY (PEG) PLACEMENT (N/A)  Patient Location: Endoscopy Unit  Anesthesia Type:MAC  Level of Consciousness: alert , oriented, drowsy and patient cooperative  Airway & Oxygen Therapy: Patient Spontanous Breathing and Patient connected to nasal cannula oxygen  Post-op Assessment: Report given to RN and Post -op Vital signs reviewed and stable  Post vital signs: Reviewed and stable  Last Vitals:  Vitals:   04/07/17 0954 04/07/17 1141  BP: (!) 143/79 130/61  Pulse: (!) 59 63  Resp: 15 (!) 24  Temp: 36.6 C     Last Pain:  Vitals:   04/07/17 0954  TempSrc: Oral  PainSc:          Complications: No apparent anesthesia complications

## 2017-04-07 NOTE — Anesthesia Preprocedure Evaluation (Addendum)
Anesthesia Evaluation  Patient identified by MRN, date of birth, ID band Patient awake    Reviewed: Allergy & Precautions, NPO status , Patient's Chart, lab work & pertinent test results  Airway Mallampati: II  TM Distance: >3 FB Neck ROM: Full    Dental  (+) Teeth Intact, Dental Advisory Given, Poor Dentition   Pulmonary former smoker,    breath sounds clear to auscultation       Cardiovascular hypertension,  Rhythm:Regular Rate:Normal     Neuro/Psych CVA negative psych ROS   GI/Hepatic negative GI ROS, Neg liver ROS,   Endo/Other  negative endocrine ROS  Renal/GU Renal disease  negative genitourinary   Musculoskeletal negative musculoskeletal ROS (+)   Abdominal   Peds negative pediatric ROS (+)  Hematology negative hematology ROS (+)   Anesthesia Other Findings   Reproductive/Obstetrics negative OB ROS                            Lab Results  Component Value Date   WBC 8.2 04/05/2017   HGB 12.2 (L) 04/05/2017   HCT 34.7 (L) 04/05/2017   MCV 90.4 04/05/2017   PLT 142 (L) 04/05/2017   Lab Results  Component Value Date   CREATININE 0.94 04/07/2017   BUN 16 04/07/2017   NA 139 04/07/2017   K 3.2 (L) 04/07/2017   CL 107 04/07/2017   CO2 22 04/07/2017   Lab Results  Component Value Date   INR 1.07 03/31/2017   03/2017 EKG: normal sinus rhythm.  Echo: - Left ventricle: The cavity size was normal. Wall thickness was   normal. Systolic function was normal. The estimated ejection   fraction was in the range of 55% to 60%. Wall motion was normal;   there were no regional wall motion abnormalities. Left   ventricular diastolic function parameters were normal. - Pulmonary arteries: Systolic pressure was mildly increased. PA   peak pressure: 37 mm Hg (S).  Anesthesia Physical Anesthesia Plan  ASA: III  Anesthesia Plan: MAC   Post-op Pain Management:    Induction:  Intravenous  Airway Management Planned: Natural Airway  Additional Equipment:   Intra-op Plan:   Post-operative Plan:   Informed Consent: I have reviewed the patients History and Physical, chart, labs and discussed the procedure including the risks, benefits and alternatives for the proposed anesthesia with the patient or authorized representative who has indicated his/her understanding and acceptance.   Dental advisory given  Plan Discussed with: CRNA  Anesthesia Plan Comments:         Anesthesia Quick Evaluation

## 2017-04-07 NOTE — Anesthesia Procedure Notes (Signed)
Procedure Name: MAC Date/Time: 04/07/2017 11:15 AM Performed by: Everlean Cherry A Pre-anesthesia Checklist: Patient identified, Emergency Drugs available, Suction available and Patient being monitored Patient Re-evaluated:Patient Re-evaluated prior to inductionOxygen Delivery Method: Nasal cannula

## 2017-04-07 NOTE — Op Note (Signed)
[facility] Patient Name: Matthew Meza Procedure Date : 04/07/2017 MRN: [id number] Attending MD: Violeta Gelinas , MD Date of Birth: 11/12/1941 CSN: [id number] Age: 76 Admit Type: Inpatient Procedure:                Upper GI endoscopy Indications:              Place PEG because patient is unable to eat due to                            stroke (CVA) Providers:                Violeta Gelinas, MD, Mattie Marlin, PAS, Norman Clay,                            RN, Carin Hock, RN, Zoila Shutter, Technician,                            Jacqulyn Liner, Technician, Harrington Challenger, Technician,                            Sharyn Lull, CRNA Referring MD:              Medicines:                Monitored Anesthesia Care Complications:            No immediate complications. Estimated Blood Loss:     Estimated blood loss was minimal. Procedure:                Pre-Anesthesia Assessment:                           - Prior to the procedure, a History and Physical                            was performed, and patient medications and                            allergies were reviewed. The patient is unable to                            give consent secondary to the patient's altered                            mental status. The risks and benefits of the                            procedure and the sedation options and risks were                            discussed with the patient's daughter. All                            questions were answered and informed consent was  obtained. Patient identification and proposed                            procedure were verified by the physician, the                            nurse, the anesthetist and the technician in the                            endoscopy suite. Mental Status Examination: alert                            but confused. Airway Examination: normal                            oropharyngeal airway and neck  mobility. Respiratory                            Examination: clear to auscultation. CV Examination:                            regular rate and rhythm. ASA Grade Assessment: III                            - A patient with severe systemic disease. After                            reviewing the risks and benefits, the patient was                            deemed in satisfactory condition to undergo the                            procedure. The anesthesia plan was to use moderate                            sedation / analgesia (conscious sedation).                            Immediately prior to administration of medications,                            the patient was re-assessed for adequacy to receive                            sedatives. The heart rate, respiratory rate, oxygen                            saturations, blood pressure, adequacy of pulmonary                            ventilation, and response to care were monitored  throughout the procedure. The physical status of                            the patient was re-assessed after the procedure.                           After obtaining informed consent, the endoscope was                            passed under direct vision. Throughout the                            procedure, the patient's blood pressure, pulse, and                            oxygen saturations were monitored continuously. The                            [id number] ([id number]) scope was introduced through the                            mouth, and advanced to the second part of duodenum.                            The upper GI endoscopy was accomplished without                            difficulty. The patient tolerated the procedure                            well. Scope In: Scope Out: Findings:      No gross lesions were noted [Site].      No gross lesions were noted [Site]. Placement of an externally removable       PEG with no  T-fasteners was successfully completed. The external bumper       was at the 2.5 cm marking on the tube. Estimated blood loss was minimal. Impression:               - No gross lesions in esophagus.                           - No gross lesions in the stomach.                           - An externally removable PEG placement was                            successfully completed.                           - No specimens collected. Recommendation:            Procedure Code(s):        --- Professional ---  [id number], Esophagogastroduodenoscopy, flexible,                            transoral; with directed placement of percutaneous                            gastrostomy tube Diagnosis Code(s):        --- Professional ---                           U04.540, Other sequelae of cerebral infarction                           Z43.1, Encounter for attention to gastrostomy                           R63.3, Feeding difficulties CPT copyright 2016 [employer]. All rights reserved. The codes documented in this report are preliminary and upon coder review may  be revised to meet current compliance requirements. Violeta Gelinas, MD 04/07/2017 11:39:11 AM This report has been signed electronically. Number of Addenda: 0

## 2017-04-07 NOTE — Progress Notes (Signed)
Pt s/p PEG placement today. Plan is to d/c to SNF once tolerating tube feedings. CM following.

## 2017-04-07 NOTE — Progress Notes (Signed)
Patient ID: Matthew BoroughsRaymond J Meza, male   DOB: 1941-11-29, 76 y.o.   MRN: [id number] For PEG placement today. Patient in Endo pre-procedure. Ancef ordered. Violeta GelinasBurke Alexus Michael, MD, MPH, FACS Trauma: [phone] General Surgery: [phone]

## 2017-04-07 NOTE — Progress Notes (Signed)
STROKE TEAM PROGRESS NOTE   HISTORY OF PRESENT ILLNESS (per record) Matthew Meza is a 76 y.o. male who was last in his normal state of health definitely yesterday evening when he interacted with someone at his facility. 4 AM, he got up and went to the bathroom and felt normal and that time. When he tried it up for more to the bathroom, however, he fell and was unable to get up. He was then unable to get to a phone for about 6 hours and once he finally did, he called 911 and was brought in as a code stroke.   LKW: 4/26 evening tpa given?: no, outside of window Modified Rankin Score: 0    SUBJECTIVE (INTERVAL HISTORY) He just returned from PEG tube insertion surgery.hiccups yet continue to be bothersomeaced tomorrow  OBJECTIVE Temp:  [97.7 F (36.5 C)-98.9 F (37.2 C)] 97.7 F (36.5 C) (05/04 1328) Pulse Rate:  [54-71] 59 (05/04 1328) Cardiac Rhythm: Normal sinus rhythm (05/03 1900) Resp:  [15-24] 18 (05/04 1328) BP: (130-159)/(61-79) 159/67 (05/04 1328) SpO2:  [93 %-98 %] 97 % (05/04 1328) Weight:  [207 lb (93.9 kg)] 207 lb (93.9 kg) (05/04 0700)  CBC:   Recent Labs Lab 04/04/17 0538 04/05/17 0356  WBC 9.1 8.2  NEUTROABS 7.1 5.9  HGB 12.2* 12.2*  HCT 35.8* 34.7*  MCV 90.2 90.4  PLT 114* 142*    Basic Metabolic Panel:   Recent Labs Lab 04/02/17 0055  04/04/17 0538 04/05/17 0356 04/07/17 0417  NA 140  --   --   --  139  K 3.7  --   --   --  3.2*  CL 111  --   --   --  107  CO2 22  --   --   --  22  GLUCOSE 164*  --   --   --  110*  BUN 20  --   --   --  16  CREATININE 1.14  --   --   --  0.94  CALCIUM 7.7*  --   --   --  7.8*  MG  --   < > 2.0 1.9  --   PHOS  --   < > 1.8* 2.3*  --   < > = values in this interval not displayed.  Lipid Panel:     Component Value Date/Time   CHOL 148 04/01/2017 0442   TRIG 109 04/01/2017 0442   HDL 27 (L) 04/01/2017 0442   CHOLHDL 5.5 04/01/2017 0442   VLDL 22 04/01/2017 0442   LDLCALC 99 04/01/2017 0442    HgbA1c:  Lab Results  Component Value Date   HGBA1C 6.2 (H) 04/01/2017   Urine Drug Screen:     Component Value Date/Time   LABOPIA NONE DETECTED 12/13/2009 2102   COCAINSCRNUR NONE DETECTED 12/13/2009 2102   LABBENZ NONE DETECTED 12/13/2009 2102   AMPHETMU NONE DETECTED 12/13/2009 2102   THCU NONE DETECTED 12/13/2009 2102   LABBARB  12/13/2009 2102    NONE DETECTED        DRUG SCREEN FOR MEDICAL PURPOSES ONLY.  IF CONFIRMATION IS NEEDED FOR ANY PURPOSE, NOTIFY LAB WITHIN 5 DAYS.        LOWEST DETECTABLE LIMITS FOR URINE DRUG SCREEN Drug Class       Cutoff (ng/mL) Amphetamine      1000 Barbiturate      200 Benzodiazepine   200 Tricyclics       300 Opiates  300 Cocaine          300 THC              50    Alcohol Level     Component Value Date/Time   [facility]  [facility] 2140    <5        LOWEST DETECTABLE LIMIT FOR SERUM ALCOHOL IS 5 mg/dL FOR MEDICAL PURPOSES ONLY    IMAGING  Ct Angio Head W Or Wo Contrast Ct Angio Neck W Or Wo Contrast Ct Cerebral Perfusion W Contrast 03/31/2017 1. Positive for emergent large vessel occlusion, and favorable CT perfusion findings for endovascular re-perfusion.  2. Occlusion of the Right ICA just beyond its origin. Reconstitution of the Right ICA terminus beginning at the anterior genu (perhaps via the right ophthalmic artery). But superimposed Right MCA mid M1 segment occlusion.  3. CT perfusion indicates a core infarct of 40 mL in the Right MCA territory and right hemisphere penumbra of 146 mL.  4. The above was relayed to Dr. Ritta SlotMcNeill Kirkpatrick beginning at 1059 hours.  5. Stenosis of the right ACA origin, but the right A1 segment may be non dominant. Right A2 and distal ACA enhancement is within normal limits. Normal left MCA and left ACA.  6. Aortic arch and bilateral carotid atherosclerosis without additional stenosis.  7. Mild left vertebral artery origin stenosis due to soft plaque. Otherwise negative posterior  circulation.  8. Calcified coronary artery atherosclerosis.   Ct Head Wo Contrast 04/01/2017 Developing bland and hemorrhagic infarction in the RIGHT hemisphere corresponding roughly to the area of infarcted core of brain on recent CT perfusion.    Ct Head Wo Contrast 03/31/2017 1. Extensive areas of hyperdensity involving the basal ganglia and right MCA territory cortex likely reflecting luxury perfusion associated with the areas of acute infarction following revascularization. Hemorrhage is not entirely excluded, but felt to be a minority of the hyperdensity visualized. The areas largely correspond to the areas of core infarction on the CTP study.  2. A tiny amount of subarachnoid hemorrhage may be present without intraventricular hemorrhage.      Ir [place] Guide Vasc Access Right 04/01/2017 IMPRESSION: Status post cerebral angiogram and treatment of tandem occlusion of the right ICA and right M1, with emergent stent system of the right ICA for treatment of dissection and intraluminal thrombus, and mechanical thrombectomy of the right MCA and carotid siphon, with restoration of TICI 3 flow. Deployment of Exoseal for hemostasis. Signed, Yvone NeuJaime S. Loreta AveWagner, DO [facility] PLAN: Patient is stable to CT department for head CT Admit to neuro ICU. First 24 hours target systolic blood pressure of 120-140. Right hip straight for 6 hours status post removal of 8 French sheath. Electronically Signed   By: Gilmer MorJaime  Wagner D.O.   On: 04/01/2017 11:23     Dg Chest Port 1 View 04/01/2017 Early bibasilar atelectasis similar to priors.   Dg Chest Port 1 View 03/31/2017 1. Unremarkable positioning of endotracheal and orogastric tubes. 2. Low volumes with mild atelectasis.     Ct Head Code Stroke W/o Cm 03/31/2017 1. Emergent right MCA large vessel occlusion suspected. No associated hemorrhage or mass effect.  2. ASPECTS is 9.    PHYSICAL  EXAM Elderly Caucasian male  .  Marland Kitchen. Afebrile. Head is nontraumatic. Neck is supple without bruit.    Cardiac exam no murmur or gallop. Lungs are clear to auscultation. Distal pulses are well felt.  Neurological Exam :  Drowsy but  awakens easily and follows midline and commands on right side  .Marland Kitchen Left gaze preference but able to look to right. Blinks to threat bilaterally. Fundi not visualized.left lower face weakness. Tongue midline. Left hemiplegia but able to withdraw to pain lower extremity more than upper extremity. Purposeful antigravity movements on the right side. Left plantar upgoing right downgoing. ASSESSMENT/PLAN Matthew Meza is a 76 y.o. male with no significant past medical history presenting with left hemiparesis. He did not receive IV t-PA due to late presentation. Status post IR thrombectomy and stenting as above.  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 Superimposed Right MCA mid M1 segment occlusion  Carotid Doppler - CTA neck 2D Echo - Normal LV function; trace MR and TR; mildly elevated pulmonary    pressure.  LDL - 99  HgbA1c 6.2   VTE prophylaxis - SCDs  Diet NPO time specified  No antithrombotic prior to admission, now on aspirin 81 mg daily (Plavix discontinued secondary to hemorrhage)  Patient will be counseled to be compliant with his antithrombotic medications  Ongoing aggressive stroke risk factor management  Therapy recommendations: pending  Disposition: Pending  Hypertension  Blood pressure tends to run low  Permissive hypertension (OK if < 220/120) but gradually normalize in 5-7 days  Long-term BP goal normotensive  Hyperlipidemia  Home meds: No lipid lowering medications prior to admission  LDL 99, goal < 70  Start Lipitor 40 mg daily when access is  available  Continue statin at discharge    Other Stroke Risk Factors  Advanced age  The patient quit smoking 21 years ago.  Obesity, Body mass index is 32.49 kg/m., recommend weight loss, diet and exercise as appropriate   Family hx stroke (mother)    Other Active Problems  Hemorrhagic conversion -> discontinue Plavix     Electrolyte abnormalities - recheck in a.m.  Hyperglycemia - await hemoglobin A1c  Post stroke dysphagia  Hiccups  Hospital day # 7 Plan try depacon for  Hiccups and continue reglan 10 mg 3 times daily. Discuss with RN .start using  PEG tube as per protocol ofy the trauma team. Transfer to rehabilitation in skilled nursing facility over the next few days  Delia Heady, MD Medical Director [facility] Pager: [phone] 04/07/2017 1:56 PM    To contact Stroke Continuity provider, please refer to [email or web address]. After hours, contact General Neurology

## 2017-04-07 NOTE — Progress Notes (Signed)
Physical Therapy Treatment Patient Details Name: Matthew Meza MRN: [id number] DOB: [birth year] Today's Date: 04/07/2017    History of Present Illness 76 yo admitted with Rt MCA CVA  emergent stent system of the right ICA for treatment of dissection and intraluminal thrombus, and mechanical thrombectomy of the right MCA and carotid siphon, VDRF 4/27-4/29. No significant PMHx. Coretrac placed 5/3.     PT Comments    Pt seen for mobility progression and working on postural control and sitting balance. Pt continues to have poor attention to task but with improved trunk control and ability to maintain midline sitting EOB ~50% of the time in sitting. Pt tolerated sitting upright x15 minutes. Pt repositioned in bed (per RN's request) with pillow supporting bilateral LEs and L UE to reduce dependent edema. Pt would continue to benefit from skilled physical therapy services at this time while admitted and after d/c to address the below listed limitations in order to improve overall safety and independence with functional mobility.    Follow Up Recommendations  SNF;Supervision/Assistance - 24 hour     Equipment Recommendations  Other (comment) (defer to next venue)    Recommendations for Other Services       Precautions / Restrictions Precautions Precautions: Fall Precaution Comments: left hemiplegia, neglect Restrictions Weight Bearing Restrictions: No    Mobility  Bed Mobility Overal bed mobility: Needs Assistance Bed Mobility: Rolling;Sidelying to Sit;Sit to Sidelying Rolling: Min assist Sidelying to sit: Mod assist;+2 for physical assistance     Sit to sidelying: Max assist;+2 for physical assistance General bed mobility comments: facilitation with rolling and pushing sidelying to sit  Transfers                    Ambulation/Gait                 Stairs            Wheelchair Mobility    Modified Rankin (Stroke Patients Only) Modified Rankin  (Stroke Patients Only) Pre-Morbid Rankin Score: No symptoms Modified Rankin: Severe disability     Balance Overall balance assessment: Needs assistance Sitting-balance support: Single extremity supported;Feet supported Sitting balance-Leahy Scale: Poor Sitting balance - Comments: pt fluctuating with requiring mod A to close min guard, requiring multimodal cueing to work on achieving midline. pt with preference to push towards his L. Pt participated in sustained midline sitting posture activity with lateral weight shifting onto R forearm and returning back to midline. Postural control: Right lateral lean                                  Cognition Arousal/Alertness: Lethargic Behavior During Therapy: Flat affect Overall Cognitive Status: Impaired/Different from baseline Area of Impairment: Attention;Memory;Safety/judgement;Awareness;Problem solving;Following commands                   Current Attention Level: Sustained Memory: Decreased short-term memory Following Commands: Follows one step commands consistently Safety/Judgement: Decreased awareness of safety;Decreased awareness of deficits Awareness: Emergent Problem Solving: Slow processing;Decreased initiation;Difficulty sequencing;Requires verbal cues;Requires tactile cues        Exercises      General Comments        Pertinent Vitals/Pain Pain Assessment: Faces Faces Pain Scale: No hurt    Home Living                      Prior Function  PT Goals (current goals can now be found in the care plan section) Acute Rehab PT Goals PT Goal Formulation: With patient Time For Goal Achievement: 04/17/17 Potential to Achieve Goals: Fair Progress towards PT goals: Progressing toward goals    Frequency    Min 4X/week      PT Plan Current plan remains appropriate    Co-evaluation              AM-PAC PT "6 Clicks" Daily Activity  Outcome Measure  Difficulty turning  over in bed (including adjusting bedclothes, sheets and blankets)?: Total Difficulty moving from lying on back to sitting on the side of the bed? : Total Difficulty sitting down on and standing up from a chair with arms (e.g., wheelchair, bedside commode, etc,.)?: Total Help needed moving to and from a bed to chair (including a wheelchair)?: Total Help needed walking in hospital room?: Total Help needed climbing 3-5 steps with a railing? : Total 6 Click Score: 6    End of Session Equipment Utilized During Treatment: Oxygen Activity Tolerance: Patient tolerated treatment well;Patient limited by lethargy Patient left: in bed;with call bell/phone within reach;with bed alarm set;with SCD's reapplied Nurse Communication: Mobility status;Need for lift equipment PT Visit Diagnosis: Other abnormalities of gait and mobility (R26.89);Unsteadiness on feet (R26.81);Hemiplegia and hemiparesis Hemiplegia - Right/Left: Left Hemiplegia - dominant/non-dominant: Non-dominant Hemiplegia - caused by: Cerebral infarction     Time: 0840-0910 PT Time Calculation (min) (ACUTE ONLY): 30 min  Charges:  $Therapeutic Activity: 23-37 mins                    G Codes:       Matthew Meza, PT, DPT [phone]    Matthew BevelsJennifer M Sawsan Meza 04/07/2017, 10:31 AM

## 2017-04-07 NOTE — Progress Notes (Signed)
RN turned in to security office pt's personal belongings. Pt wallet and business card were turned in to locker # 10. Pt's phone was left at the bedside.

## 2017-04-08 ENCOUNTER — Inpatient Hospital Stay (HOSPITAL_COMMUNITY): Payer: Medicare HMO

## 2017-04-08 DIAGNOSIS — I63411 Cerebral infarction due to embolism of right middle cerebral artery: Principal | ICD-10-CM

## 2017-04-08 DIAGNOSIS — I6521 Occlusion and stenosis of right carotid artery: Secondary | ICD-10-CM

## 2017-04-08 DIAGNOSIS — I7771 Dissection of carotid artery: Secondary | ICD-10-CM

## 2017-04-08 LAB — BASIC METABOLIC PANEL
Anion gap: 9 (ref 5–15)
BUN: 14 mg/dL (ref 6–20)
CALCIUM: 8 mg/dL — AB (ref 8.9–10.3)
CO2: 23 mmol/L (ref 22–32)
CREATININE: 0.94 mg/dL (ref 0.61–1.24)
Chloride: 107 mmol/L (ref 101–111)
GFR calc Af Amer: >60 mL/min (ref >60)
GFR calc non Af Amer: >60 mL/min (ref >60)
GLUCOSE: 192 mg/dL — AB (ref 65–99)
Potassium: 3.2 mmol/L — ABNORMAL LOW (ref 3.5–5.1)
Sodium: 139 mmol/L (ref 135–145)

## 2017-04-08 LAB — CBC
HEMATOCRIT: 34.5 % — AB (ref 39.0–52.0)
Hemoglobin: 11.8 g/dL — ABNORMAL LOW (ref 13.0–17.0)
MCH: 30.3 pg (ref 26.0–34.0)
MCHC: 34.2 g/dL (ref 30.0–36.0)
MCV: 88.5 fL (ref 78.0–100.0)
Platelets: 149 10*3/uL — ABNORMAL LOW (ref 150–400)
RBC: 3.9 MIL/uL — ABNORMAL LOW (ref 4.22–5.81)
RDW: 12.5 % (ref 11.5–15.5)
WBC: 9.8 10*3/uL (ref 4.0–10.5)

## 2017-04-08 LAB — GLUCOSE, CAPILLARY
GLUCOSE-CAPILLARY: 163 mg/dL — AB (ref 65–99)
GLUCOSE-CAPILLARY: 170 mg/dL — AB (ref 65–99)
Glucose-Capillary: 207 mg/dL — ABNORMAL HIGH (ref 65–99)
Glucose-Capillary: 163 mg/dL — ABNORMAL HIGH (ref 65–99)
Glucose-Capillary: 179 mg/dL — ABNORMAL HIGH (ref 65–99)
Glucose-Capillary: 183 mg/dL — ABNORMAL HIGH (ref 65–99)

## 2017-04-08 LAB — VALPROIC ACID LEVEL: Valproic Acid Lvl: 43 ug/mL — ABNORMAL LOW (ref 50.0–100.0)

## 2017-04-08 MED ORDER — POTASSIUM CHLORIDE CRYS ER 20 MEQ PO TBCR
40.0000 meq | EXTENDED_RELEASE_TABLET | ORAL | Status: AC
  Administered 2017-04-08 (×2): 40 meq via ORAL
  Filled 2017-04-08 (×2): qty 2

## 2017-04-08 MED ORDER — ENOXAPARIN SODIUM 40 MG/0.4ML ~~LOC~~ SOLN
40.0000 mg | SUBCUTANEOUS | Status: DC
  Administered 2017-04-08 – 2017-04-12 (×5): 40 mg via SUBCUTANEOUS
  Filled 2017-04-08 (×5): qty 0.4

## 2017-04-08 MED ORDER — FAMOTIDINE 20 MG PO TABS
20.0000 mg | ORAL_TABLET | Freq: Two times a day (BID) | ORAL | Status: DC
  Administered 2017-04-08 – 2017-04-10 (×6): 20 mg via ORAL
  Filled 2017-04-08 (×7): qty 1

## 2017-04-08 MED ORDER — ATORVASTATIN CALCIUM 40 MG PO TABS
40.0000 mg | ORAL_TABLET | Freq: Every day | ORAL | Status: DC
  Administered 2017-04-08 – 2017-04-10 (×3): 40 mg via ORAL
  Filled 2017-04-08 (×3): qty 1

## 2017-04-08 NOTE — Progress Notes (Signed)
1 Day Post-Op   Subjective/Chief Complaint: Sleeping. POD 1 PEG. Tolerating tube feeds at 75.   Objective: Vital signs in last 24 hours: Temp:  [97.7 F (36.5 C)-99.5 F (37.5 C)] 97.7 F (36.5 C) (05/05 0039) Pulse Rate:  [54-75] 62 (05/05 0039) Resp:  [15-24] 20 (05/05 0039) BP: (110-159)/(50-79) 119/60 (05/05 0039) SpO2:  [95 %-98 %] 96 % (05/05 0039) Last BM Date: 04/06/17  Intake/Output from previous day: 05/04 0701 - 05/05 0700 In: 200 [I.V.:200] Out: 600 [Urine:600] Intake/Output this shift: No intake/output data recorded.  Sleeping Unlabored respirations Abdomen soft, nontender, nondistended. PEG site clean and dry. Tube feeds at 1775ml/hr.   Lab Results:   Recent Labs  04/08/17 0247  WBC 9.8  HGB 11.8*  HCT 34.5*  PLT 149*   BMET  Recent Labs  04/07/17 0417 04/08/17 0247  NA 139 139  K 3.2* 3.2*  CL 107 107  CO2 22 23  GLUCOSE 110* 192*  BUN 16 14  CREATININE 0.94 0.94  CALCIUM 7.8* 8.0*   PT/INR No results for input(s): LABPROT, INR in the last 72 hours. ABG No results for input(s): PHART, HCO3 in the last 72 hours.  Invalid input(s): PCO2, PO2  Studies/Results: No results found.  Anti-infectives: Anti-infectives    Start     Dose/Rate Route Frequency Ordered Stop   04/07/17 1000  ceFAZolin (ANCEF) IVPB 2g/100 mL premix     2 g 200 mL/hr over 30 Minutes Intravenous To ShortStay [facility] 04/06/17 1612 04/07/17 1145   03/31/17 1118  ceFAZolin (ANCEF) 2-4 GM/100ML-% IVPB    Comments:  Dhers, Patricia   : cabinet override      03/31/17 1118 03/31/17 2329      Assessment/Plan: s/p Procedure(s): ESOPHAGOGASTRODUODENOSCOPY (EGD) (N/A) PERCUTANEOUS ENDOSCOPIC GASTROSTOMY (PEG) PLACEMENT (N/A) TOlerating tube feeds, site looks good. Surgery will sign off. Please call with questions or concerns any time.   LOS: 8 days    Berna BueChelsea A Connor 04/08/2017

## 2017-04-08 NOTE — Progress Notes (Signed)
Patient went down for a ct head on shift. No distress noted

## 2017-04-08 NOTE — Progress Notes (Signed)
STROKE TEAM PROGRESS NOTE   SUBJECTIVE (INTERVAL HISTORY) No family is at bedside. He is on TF now. Still has intermittent hiccup. More lethargic but able to arouse and following commands, answering questions. Will repeat CT head and decide on plavix.   OBJECTIVE Temp:  [97.7 F (36.5 C)-99.5 F (37.5 C)] 97.9 F (36.6 C) (05/05 0956) Pulse Rate:  [54-75] 57 (05/05 0956) Cardiac Rhythm: Normal sinus rhythm (05/05 0808) Resp:  [18-22] 22 (05/05 0956) BP: (110-159)/(50-69) 138/65 (05/05 0956) SpO2:  [95 %-98 %] 98 % (05/05 0956)  CBC:   Recent Labs Lab 04/04/17 0538 04/05/17 0356 04/08/17 0247  WBC 9.1 8.2 9.8  NEUTROABS 7.1 5.9  --   HGB 12.2* 12.2* 11.8*  HCT 35.8* 34.7* 34.5*  MCV 90.2 90.4 88.5  PLT 114* 142* 149*    Basic Metabolic Panel:   Recent Labs Lab 04/04/17 0538 04/05/17 0356 04/07/17 0417 04/08/17 0247  NA  --   --  139 139  K  --   --  3.2* 3.2*  CL  --   --  107 107  CO2  --   --  22 23  GLUCOSE  --   --  110* 192*  BUN  --   --  16 14  CREATININE  --   --  0.94 0.94  CALCIUM  --   --  7.8* 8.0*  MG 2.0 1.9  --   --   PHOS 1.8* 2.3*  --   --     Lipid Panel:     Component Value Date/Time   CHOL 148 04/01/2017 0442   TRIG 109 04/01/2017 0442   HDL 27 (L) 04/01/2017 0442   CHOLHDL 5.5 04/01/2017 0442   VLDL 22 04/01/2017 0442   LDLCALC 99 04/01/2017 0442   HgbA1c:  Lab Results  Component Value Date   HGBA1C 6.2 (H) 04/01/2017   Urine Drug Screen:     Component Value Date/Time   LABOPIA NONE DETECTED 12/13/2009 2102   COCAINSCRNUR NONE DETECTED 12/13/2009 2102   LABBENZ NONE DETECTED 12/13/2009 2102   AMPHETMU NONE DETECTED 12/13/2009 2102   THCU NONE DETECTED 12/13/2009 2102   LABBARB  12/13/2009 2102    NONE DETECTED        DRUG SCREEN FOR MEDICAL PURPOSES ONLY.  IF CONFIRMATION IS NEEDED FOR ANY PURPOSE, NOTIFY LAB WITHIN 5 DAYS.        LOWEST DETECTABLE LIMITS FOR URINE DRUG SCREEN Drug Class       Cutoff  (ng/mL) Amphetamine      1000 Barbiturate      200 Benzodiazepine   200 Tricyclics       300 Opiates          300 Cocaine          300 THC              50    Alcohol Level     Component Value Date/Time   [facility] 2140    <5        LOWEST DETECTABLE LIMIT FOR SERUM ALCOHOL IS 5 mg/dL FOR MEDICAL PURPOSES ONLY    IMAGING I have personally reviewed the radiological images below and agree with the radiology interpretations.  Ct Angio Head W Or Wo Contrast Ct Angio Neck W Or Wo Contrast Ct Cerebral Perfusion W Contrast 03/31/2017 1. Positive for emergent large vessel occlusion, and favorable CT perfusion findings for endovascular re-perfusion.  2. Occlusion of the Right ICA just beyond  its origin. Reconstitution of the Right ICA terminus beginning at the anterior genu (perhaps via the right ophthalmic artery). But superimposed Right MCA mid M1 segment occlusion.  3. CT perfusion indicates a core infarct of 40 mL in the Right MCA territory and right hemisphere penumbra of 146 mL.  4. The above was relayed to Dr. Ritta Slot beginning at 1059 hours.  5. Stenosis of the right ACA origin, but the right A1 segment may be non dominant. Right A2 and distal ACA enhancement is within normal limits. Normal left MCA and left ACA.  6. Aortic arch and bilateral carotid atherosclerosis without additional stenosis.  7. Mild left vertebral artery origin stenosis due to soft plaque. Otherwise negative posterior circulation.  8. Calcified coronary artery atherosclerosis.  Ct Head Wo Contrast 04/01/2017 Developing bland and hemorrhagic infarction in the RIGHT hemisphere corresponding roughly to the area of infarcted core of brain on recent CT perfusion.  Ct Head Wo Contrast 03/31/2017 1. Extensive areas of hyperdensity involving the basal ganglia and right MCA territory cortex likely reflecting luxury perfusion associated with the areas of acute infarction following revascularization.  Hemorrhage is not entirely excluded, but felt to be a minority of the hyperdensity visualized. The areas largely correspond to the areas of core infarction on the CTP study.  2. A tiny amount of subarachnoid hemorrhage may be present without intraventricular hemorrhage.   Ct Head Code Stroke W/o Cm 03/31/2017 1. Emergent right MCA large vessel occlusion suspected. No associated hemorrhage or mass effect.  2. ASPECTS is 9.   MRI brain  04/01/17 Acute large RIGHT MCA territory infarct with similar petechial hemorrhage.  Ct Head Wo Contrast - pending   PHYSICAL EXAM Elderly Caucasian male. Afebrile. Head is nontraumatic. Neck is supple without bruit.  Cardiac exam no murmur or gallop. Lungs are clear to auscultation. Distal pulses are well felt.  Neurological Exam :  Drowsy but awakens easily and follows midline and commands on right side. Orientated AAOx3, moderate dysarthria, no significant aphasia, but paucity of speech. PREEL, right gaze preference barely across midline. Fundi not visualized. Left lower face weakness. Tongue midline. Left hemiplegia. Purposeful antigravity movements on the right side, at least 3/5. Left plantar upgoing right downgoing. Intermittent hiccup  ASSESSMENT/PLAN [name] is a 76 y.o. male with no significant past medical history presenting with left hemiparesis. He did not receive IV t-PA due to late presentation. 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  Resultant  Left hemiplegia  MRI - Acute large RIGHT MCA territory infarct with similar petechial hemorrhage.  CTA H&N - Rt ICA occlusion. Superimposed Right MCA mid M1 segment occlusion  CT head - right MCA territory hemorrhagic transformation  Ct Head repeat - pending  2D Echo - Normal LV function; trace MR and TR; mildly elevated pulmonary pressure  LDL - 99  HgbA1c 6.2   VTE  prophylaxis - lovenox  Diet NPO time specified  No antithrombotic prior to admission, now on aspirin 81 mg daily. Plavix discontinued secondary to hemorrhage. If repeat CT showed hemorrhage resolved, will need resume plavix  Patient will be counseled to be compliant with his antithrombotic medications  Ongoing aggressive stroke risk factor management  Therapy recommendations: SNF  Disposition: Pending  ICA occlusion, right  s/p CAS  On ASA  Resume plavix if hemorrhage resolved  Hypertension  stable  Long-term BP goal normotensive  Hyperlipidemia  Home meds: No  lipid lowering medications prior to admission  LDL 99, goal < 70  Start Lipitor 40 mg daily   Continue statin at discharge  Dysphagia  Did not pass swallow  s/p PEG 04/07/17  On TF  Hiccup on reglan tid with thorazine PRN  Other Stroke Risk Factors  Advanced age  The patient quit smoking 21 years ago.  Obesity, Body mass index is 32.49 kg/m., recommend weight loss, diet and exercise as appropriate   Family hx stroke (mother)  Other Active Problems  Hypokalemia - 3.2 - supplemented - recheck in a.m.  Valproic Acid level - 43 (L) (Depacon discontinued)  Mild anemia  Hospital day # 8  Marvel Plan, MD PhD Stroke Neurology 04/08/2017 3:37 PM   To contact Stroke Continuity provider, please refer to [email or web address]. After hours, contact General Neurology

## 2017-04-09 ENCOUNTER — Inpatient Hospital Stay (HOSPITAL_COMMUNITY): Payer: Medicare HMO

## 2017-04-09 LAB — BASIC METABOLIC PANEL
Anion gap: 11 (ref 5–15)
BUN: 12 mg/dL (ref 6–20)
CHLORIDE: 104 mmol/L (ref 101–111)
CO2: 24 mmol/L (ref 22–32)
Calcium: 8.1 mg/dL — ABNORMAL LOW (ref 8.9–10.3)
Creatinine, Ser: 0.95 mg/dL (ref 0.61–1.24)
GFR calc Af Amer: >60 mL/min (ref >60)
GFR calc non Af Amer: >60 mL/min (ref >60)
GLUCOSE: 173 mg/dL — AB (ref 65–99)
POTASSIUM: 3.7 mmol/L (ref 3.5–5.1)
Sodium: 139 mmol/L (ref 135–145)

## 2017-04-09 LAB — GLUCOSE, CAPILLARY
GLUCOSE-CAPILLARY: 218 mg/dL — AB (ref 65–99)
GLUCOSE-CAPILLARY: 251 mg/dL — AB (ref 65–99)
Glucose-Capillary: 124 mg/dL — ABNORMAL HIGH (ref 65–99)
Glucose-Capillary: 207 mg/dL — ABNORMAL HIGH (ref 65–99)
Glucose-Capillary: 245 mg/dL — ABNORMAL HIGH (ref 65–99)
Glucose-Capillary: 203 mg/dL — ABNORMAL HIGH (ref 65–99)

## 2017-04-09 LAB — CBC
HEMATOCRIT: 36.7 % — AB (ref 39.0–52.0)
Hemoglobin: 12.5 g/dL — ABNORMAL LOW (ref 13.0–17.0)
MCH: 30.2 pg (ref 26.0–34.0)
MCHC: 34.1 g/dL (ref 30.0–36.0)
MCV: 88.6 fL (ref 78.0–100.0)
Platelets: 164 10*3/uL (ref 150–400)
RBC: 4.14 MIL/uL — AB (ref 4.22–5.81)
RDW: 12.6 % (ref 11.5–15.5)
WBC: 11.6 10*3/uL — ABNORMAL HIGH (ref 4.0–10.5)

## 2017-04-09 LAB — PHOSPHORUS: Phosphorus: 2.2 mg/dL — ABNORMAL LOW (ref 2.5–4.6)

## 2017-04-09 MED ORDER — IPRATROPIUM-ALBUTEROL 0.5-2.5 (3) MG/3ML IN SOLN
3.0000 mL | RESPIRATORY_TRACT | Status: DC | PRN
  Administered 2017-04-09: 3 mL via RESPIRATORY_TRACT
  Filled 2017-04-09 (×3): qty 3

## 2017-04-09 MED ORDER — ACETYLCYSTEINE 20 % IN SOLN
3.0000 mL | Freq: Three times a day (TID) | RESPIRATORY_TRACT | Status: DC
  Administered 2017-04-09 – 2017-04-12 (×7): 3 mL via RESPIRATORY_TRACT
  Filled 2017-04-09 (×11): qty 4

## 2017-04-09 MED ORDER — INSULIN ASPART 100 UNIT/ML ~~LOC~~ SOLN
0.0000 [IU] | Freq: Three times a day (TID) | SUBCUTANEOUS | Status: DC
Start: 2017-04-09 — End: 2017-04-10
  Administered 2017-04-09: 8 [IU] via SUBCUTANEOUS
  Administered 2017-04-10: 7 [IU] via SUBCUTANEOUS
  Administered 2017-04-10: 5 [IU] via SUBCUTANEOUS

## 2017-04-09 MED ORDER — CLOPIDOGREL BISULFATE 75 MG PO TABS
75.0000 mg | ORAL_TABLET | Freq: Every day | ORAL | Status: DC
  Administered 2017-04-09 – 2017-04-10 (×2): 75 mg via ORAL
  Filled 2017-04-09 (×3): qty 1

## 2017-04-09 MED ORDER — ASPIRIN EC 325 MG PO TBEC
325.0000 mg | DELAYED_RELEASE_TABLET | Freq: Every day | ORAL | Status: DC
  Filled 2017-04-09: qty 1

## 2017-04-09 MED ORDER — INSULIN ASPART 100 UNIT/ML ~~LOC~~ SOLN
0.0000 [IU] | Freq: Every day | SUBCUTANEOUS | Status: DC
Start: 2017-04-09 — End: 2017-04-10
  Administered 2017-04-09: 2 [IU] via SUBCUTANEOUS

## 2017-04-09 NOTE — Progress Notes (Signed)
STROKE TEAM PROGRESS NOTE   SUBJECTIVE (INTERVAL HISTORY) No family at bedside. Pt still lethargic but arousable. Following commands. Left hemipegia. Repeat CT showed resolution of hemorrhagic conversion. Will start plavix. Hyperglycemia, put on SSI. Mild leukocytosis. Pending SNF.    OBJECTIVE Temp:  [97.7 F (36.5 C)-98 F (36.7 C)] 97.7 F (36.5 C) (05/06 0500) Pulse Rate:  [60-73] 66 (05/06 0500) Cardiac Rhythm: Normal sinus rhythm (05/06 0805) Resp:  [20-22] 22 (05/06 0500) BP: (139-148)/(70-78) 139/78 (05/06 0500) SpO2:  [98 %-99 %] 99 % (05/06 0500)  CBC:   Recent Labs Lab 04/04/17 0538 04/05/17 0356 04/08/17 0247 04/09/17 0349  WBC 9.1 8.2 9.8 11.6*  NEUTROABS 7.1 5.9  --   --   HGB 12.2* 12.2* 11.8* 12.5*  HCT 35.8* 34.7* 34.5* 36.7*  MCV 90.2 90.4 88.5 88.6  PLT 114* 142* 149* 164    Basic Metabolic Panel:   Recent Labs Lab 04/04/17 0538 04/05/17 0356  04/08/17 0247 04/09/17 0349  NA  --   --   < > 139 139  K  --   --   < > 3.2* 3.7  CL  --   --   < > 107 104  CO2  --   --   < > 23 24  GLUCOSE  --   --   < > 192* 173*  BUN  --   --   < > 14 12  CREATININE  --   --   < > 0.94 0.95  CALCIUM  --   --   < > 8.0* 8.1*  MG 2.0 1.9  --   --   --   PHOS 1.8* 2.3*  --   --  2.2*  < > = values in this interval not displayed.  Lipid Panel:     Component Value Date/Time   CHOL 148 04/01/2017 0442   TRIG 109 04/01/2017 0442   HDL 27 (L) 04/01/2017 0442   CHOLHDL 5.5 04/01/2017 0442   VLDL 22 04/01/2017 0442   LDLCALC 99 04/01/2017 0442   HgbA1c:  Lab Results  Component Value Date   HGBA1C 6.2 (H) 04/01/2017   Urine Drug Screen:     Component Value Date/Time   LABOPIA NONE DETECTED 12/13/2009 2102   COCAINSCRNUR NONE DETECTED 12/13/2009 2102   LABBENZ NONE DETECTED 12/13/2009 2102   AMPHETMU NONE DETECTED 12/13/2009 2102   THCU NONE DETECTED 12/13/2009 2102   LABBARB  12/13/2009 2102    NONE DETECTED        DRUG SCREEN FOR MEDICAL  PURPOSES ONLY.  IF CONFIRMATION IS NEEDED FOR ANY PURPOSE, NOTIFY LAB WITHIN 5 DAYS.        LOWEST DETECTABLE LIMITS FOR URINE DRUG SCREEN Drug Class       Cutoff (ng/mL) Amphetamine      1000 Barbiturate      200 Benzodiazepine   200 Tricyclics       300 Opiates          300 Cocaine          300 THC              50    Alcohol Level     Component Value Date/Time   [facility]  12/13/2009 2140    <5        LOWEST DETECTABLE LIMIT FOR SERUM ALCOHOL IS 5 mg/dL FOR MEDICAL PURPOSES ONLY    IMAGING I have personally reviewed the radiological images below and agree with the radiology  interpretations.  Ct Angio Head W Or Wo Contrast Ct Angio Neck W Or Wo Contrast Ct Cerebral Perfusion W Contrast 03/31/2017 1. Positive for emergent large vessel occlusion, and favorable CT perfusion findings for endovascular re-perfusion.  2. Occlusion of the Right ICA just beyond its origin. Reconstitution of the Right ICA terminus beginning at the anterior genu (perhaps via the right ophthalmic artery). But superimposed Right MCA mid M1 segment occlusion.  3. CT perfusion indicates a core infarct of 40 mL in the Right MCA territory and right hemisphere penumbra of 146 mL.  4. The above was relayed to Dr. Ritta Slot beginning at 1059 hours.  5. Stenosis of the right ACA origin, but the right A1 segment may be non dominant. Right A2 and distal ACA enhancement is within normal limits. Normal left MCA and left ACA.  6. Aortic arch and bilateral carotid atherosclerosis without additional stenosis.  7. Mild left vertebral artery origin stenosis due to soft plaque. Otherwise negative posterior circulation.  8. Calcified coronary artery atherosclerosis.  Ct Head Wo Contrast 04/01/2017 Developing bland and hemorrhagic infarction in the RIGHT hemisphere corresponding roughly to the area of infarcted core of brain on recent CT perfusion.  Ct Head Wo Contrast 03/31/2017 1. Extensive areas of hyperdensity  involving the basal ganglia and right MCA territory cortex likely reflecting luxury perfusion associated with the areas of acute infarction following revascularization. Hemorrhage is not entirely excluded, but felt to be a minority of the hyperdensity visualized. The areas largely correspond to the areas of core infarction on the CTP study.  2. A tiny amount of subarachnoid hemorrhage may be present without intraventricular hemorrhage.   Ct Head Code Stroke W/o Cm 03/31/2017 1. Emergent right MCA large vessel occlusion suspected. No associated hemorrhage or mass effect.  2. ASPECTS is 9.   MRI brain  04/01/17 Acute large RIGHT MCA territory infarct with similar petechial hemorrhage.  Ct Head Wo Contrast  04/08/2017 Evolving RIGHT MCA territory infarct with faint residual petechial hemorrhage.   PHYSICAL EXAM Elderly Caucasian male. Afebrile. Head is nontraumatic. Neck is supple without bruit.  Cardiac exam no murmur or gallop. Lungs are clear to auscultation. Distal pulses are well felt.  Neurological Exam :  Drowsy but awakens easily and follows midline and commands on right side. Orientated AAOx3, moderate dysarthria, no significant aphasia, but paucity of speech. PREEL, right gaze preference barely across midline. Fundi not visualized. Left lower face weakness. Tongue midline. Left hemiplegia. Purposeful antigravity movements on the right side, at least 3/5. Left plantar upgoing right downgoing.   ASSESSMENT/PLAN [name] is a 76 y.o. male with no significant past medical history presenting with left hemiparesis. He did not receive IV t-PA due to late presentation. 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  Resultant  Left hemiplegia  MRI - Acute large RIGHT MCA territory infarct with similar petechial hemorrhage.  CTA H&N - Rt ICA occlusion. Superimposed Right MCA mid M1  segment occlusion  CT head - right MCA territory hemorrhagic transformation  Ct Head repeat - Evolving RIGHT MCA territory infarct with faint residual petechial hemorrhage.  2D Echo - Normal LV function; trace MR and TR; mildly elevated pulmonary pressure  LDL - 99  HgbA1c 6.2   VTE prophylaxis - lovenox  Diet NPO time specified  No antithrombotic prior to admission, now on aspirin 81 mg daily. Resume plavix today and increase ASA to 325mg .  Continue DAPT on discharge due to carotid stenting  Patient will be counseled to be compliant with his antithrombotic medications  Ongoing aggressive stroke risk factor management  Therapy recommendations: SNF  Disposition: Pending  ICA occlusion, right  s/p CAS  Resume plavix today and increase ASA to 325mg . Continue DAPT on discharge  BP goal 130-150  Hypertension  stable  Long-term BP goal normotensive  Hyperlipidemia  Home meds: No lipid lowering medications prior to admission  LDL 99, goal < 70  Start Lipitor 40 mg daily   Continue statin at discharge  Hyperglycemia  A1C 6.2  On SSI  CBG high Q4h with TF  DM coordinator consult  Leukocytosis   Afebrile  CBC 8.2->9.8->11.6  Continue monitoring  Dysphagia  Did not pass swallow  s/p PEG 04/07/17  On TF  Hiccup on reglan 10mg  tid with thorazine PRN - improved  Other Stroke Risk Factors  Advanced age  The patient quit smoking 21 years ago.  Obesity, Body mass index is 32.49 kg/m., recommend weight loss, diet and exercise as appropriate   Family hx stroke (mother)  Other Active Problems  Hypokalemia - 3.2 -> 3.7  Mild anemia  Hospital day # 9  Marvel Plan, MD PhD Stroke Neurology 04/09/2017 1:05 PM   To contact Stroke Continuity provider, please refer to [email or web address]. After hours, contact General Neurology

## 2017-04-10 ENCOUNTER — Encounter (HOSPITAL_COMMUNITY): Payer: Self-pay | Admitting: General Surgery

## 2017-04-10 DIAGNOSIS — R066 Hiccough: Secondary | ICD-10-CM

## 2017-04-10 DIAGNOSIS — L8990 Pressure ulcer of unspecified site, unspecified stage: Secondary | ICD-10-CM | POA: Insufficient documentation

## 2017-04-10 LAB — CBC
HEMATOCRIT: 36.5 % — AB (ref 39.0–52.0)
Hemoglobin: 12.4 g/dL — ABNORMAL LOW (ref 13.0–17.0)
MCH: 30.2 pg (ref 26.0–34.0)
MCHC: 34 g/dL (ref 30.0–36.0)
MCV: 88.8 fL (ref 78.0–100.0)
PLATELETS: 187 10*3/uL (ref 150–400)
RBC: 4.11 MIL/uL — ABNORMAL LOW (ref 4.22–5.81)
RDW: 12.5 % (ref 11.5–15.5)
WBC: 11.2 10*3/uL — AB (ref 4.0–10.5)

## 2017-04-10 LAB — URINALYSIS, COMPLETE (UACMP) WITH MICROSCOPIC
BILIRUBIN URINE: NEGATIVE
Glucose, UA: >=500 mg/dL — AB
Ketones, ur: NEGATIVE mg/dL
Nitrite: NEGATIVE
PROTEIN: 30 mg/dL — AB
Specific Gravity, Urine: 1.014 (ref 1.005–1.030)
pH: 9 — ABNORMAL HIGH (ref 5.0–8.0)

## 2017-04-10 LAB — GLUCOSE, CAPILLARY
GLUCOSE-CAPILLARY: 225 mg/dL — AB (ref 65–99)
Glucose-Capillary: 241 mg/dL — ABNORMAL HIGH (ref 65–99)
Glucose-Capillary: 204 mg/dL — ABNORMAL HIGH (ref 65–99)
Glucose-Capillary: 245 mg/dL — ABNORMAL HIGH (ref 65–99)
Glucose-Capillary: 271 mg/dL — ABNORMAL HIGH (ref 65–99)
Glucose-Capillary: 266 mg/dL — ABNORMAL HIGH (ref 65–99)

## 2017-04-10 LAB — BASIC METABOLIC PANEL
ANION GAP: 9 (ref 5–15)
BUN: 13 mg/dL (ref 6–20)
CALCIUM: 8.2 mg/dL — AB (ref 8.9–10.3)
CO2: 25 mmol/L (ref 22–32)
Chloride: 102 mmol/L (ref 101–111)
Creatinine, Ser: 1.02 mg/dL (ref 0.61–1.24)
Glucose, Bld: 239 mg/dL — ABNORMAL HIGH (ref 65–99)
Potassium: 3.7 mmol/L (ref 3.5–5.1)
Sodium: 136 mmol/L (ref 135–145)

## 2017-04-10 MED ORDER — SODIUM CHLORIDE 0.9 % IV SOLN
3.0000 g | Freq: Four times a day (QID) | INTRAVENOUS | Status: DC
  Administered 2017-04-10 – 2017-04-12 (×8): 3 g via INTRAVENOUS
  Filled 2017-04-10 (×9): qty 3

## 2017-04-10 MED ORDER — AMANTADINE HCL 100 MG PO CAPS
200.0000 mg | ORAL_CAPSULE | Freq: Two times a day (BID) | ORAL | Status: DC
  Administered 2017-04-10 – 2017-04-11 (×3): 200 mg via ORAL
  Filled 2017-04-10 (×3): qty 2

## 2017-04-10 MED ORDER — IPRATROPIUM-ALBUTEROL 0.5-2.5 (3) MG/3ML IN SOLN
3.0000 mL | Freq: Three times a day (TID) | RESPIRATORY_TRACT | Status: DC
  Administered 2017-04-10 – 2017-04-12 (×7): 3 mL via RESPIRATORY_TRACT
  Filled 2017-04-10 (×6): qty 3

## 2017-04-10 MED ORDER — INSULIN ASPART 100 UNIT/ML ~~LOC~~ SOLN
0.0000 [IU] | SUBCUTANEOUS | Status: DC
  Administered 2017-04-10: 8 [IU] via SUBCUTANEOUS
  Administered 2017-04-10 – 2017-04-11 (×2): 5 [IU] via SUBCUTANEOUS
  Administered 2017-04-11 (×3): 8 [IU] via SUBCUTANEOUS
  Administered 2017-04-11: 5 [IU] via SUBCUTANEOUS
  Administered 2017-04-11 – 2017-04-12 (×2): 8 [IU] via SUBCUTANEOUS
  Administered 2017-04-12: 5 [IU] via SUBCUTANEOUS
  Administered 2017-04-12: 3 [IU] via SUBCUTANEOUS
  Administered 2017-04-12: 5 [IU] via SUBCUTANEOUS
  Administered 2017-04-12: 15 [IU] via SUBCUTANEOUS

## 2017-04-10 NOTE — Progress Notes (Signed)
SLP Cancellation Note  Patient Details Name: Matthew Meza MRN: [id number] DOB: May 10, 1941   Cancelled treatment:       Reason Eval/Treat Not Completed: Fatigue/lethargy limiting ability to participate (Per RN, patient remains very lethargic. Will f/u 5/8. )  [name] MA, CCC-SLP [phone]  [name] 04/10/2017, 11:56 AM

## 2017-04-10 NOTE — Progress Notes (Signed)
Physical Therapy Treatment Patient Details Name: Matthew Meza MRN: [id number] DOB: 03/15/1941 Today's Date: 04/10/2017    History of Present Illness 76 yo admitted with Rt MCA CVA  emergent stent system of the right ICA for treatment of dissection and intraluminal thrombus, and mechanical thrombectomy of the right MCA and carotid siphon, VDRF 4/27-4/29. No significant PMHx. Coretrac placed 5/3.     PT Comments    Patient seen for activity progression. Very modest gains. Patient remains extremely dependent with increased lethargy noted today. Increased physical assist for all aspects of mobility. SNF remains appropriate at this time.   Follow Up Recommendations  SNF;Supervision/Assistance - 24 hour     Equipment Recommendations  Other (comment) (defer to next venue)    Recommendations for Other Services Speech consult     Precautions / Restrictions Precautions Precautions: Fall Precaution Comments: left hemiplegia, neglect Restrictions Weight Bearing Restrictions: No    Mobility  Bed Mobility Overal bed mobility: Needs Assistance Bed Mobility: Sit to Supine;Sidelying to Sit;Rolling;Supine to Sit Rolling: Max assist Sidelying to sit: Max assist;+2 for physical assistance Supine to sit: Total assist;+2 for physical assistance Sit to supine: Total assist;+2 for physical assistance   General bed mobility comments: Lethargy limiting pt's ability to participate this session.   Transfers Overall transfer level: Needs assistance   Transfers: Licensed conveyancerAnterior-Posterior Transfer       Anterior-Posterior transfers: Total assist;+2 physical assistance (+3 assist)   General transfer comment: Pt highly lethargic and falling alseep throughout session.  (used of sheet and helicopted to EOB then a/p to chair)  Ambulation/Gait                 Stairs            Wheelchair Mobility    Modified Rankin (Stroke Patients Only)       Balance Overall balance assessment:  Needs assistance Sitting-balance support: Single extremity supported;Feet supported Sitting balance-Leahy Scale: Poor   Postural control: Left lateral lean (pushing to the L)                                  Cognition Arousal/Alertness: Lethargic Behavior During Therapy: Flat affect Overall Cognitive Status: Impaired/Different from baseline Area of Impairment: Attention;Memory;Safety/judgement;Awareness;Problem solving;Following commands                 Orientation Level: Disoriented to;Time Current Attention Level: Sustained Memory: Decreased short-term memory Following Commands: Follows one step commands consistently Safety/Judgement: Decreased awareness of safety;Decreased awareness of deficits Awareness: Emergent Problem Solving: Slow processing;Decreased initiation;Difficulty sequencing;Requires verbal cues;Requires tactile cues General Comments: Pt highly lethargic this session requiring maximum multimodal cues to maintain eyes open. Pt asking "what time are coffee and donuts?" Initially requiring max multimodal cues to follow commands but by end of session requiring min assist.      Exercises Other Exercises Other Exercises: worked on static sitting balance at [employer] Other Exercises: faciliatated trunk conditioning and cervical control with multi modal cues Other Exercises: Attempted reaching dynamic activities EOb    General Comments        Pertinent Vitals/Pain Pain Assessment: No/denies pain    Home Living                      Prior Function            PT Goals (current goals can now be found in the care plan section) Acute Rehab PT Goals  Patient Stated Goal: eat donuts and coffee PT Goal Formulation: With patient Time For Goal Achievement: 04/17/17 Potential to Achieve Goals: Fair Progress towards PT goals: Progressing toward goals (modest)    Frequency    Min 4X/week      PT Plan Current plan remains appropriate     Co-evaluation PT/OT/SLP Co-Evaluation/Treatment: Yes Reason for Co-Treatment: Necessary to address cognition/behavior during functional activity;Complexity of the patient's impairments (multi-system involvement);For patient/therapist safety PT goals addressed during session: Mobility/safety with mobility OT goals addressed during session: ADL's and self-care      AM-PAC PT "6 Clicks" Daily Activity  Outcome Measure  Difficulty turning over in bed (including adjusting bedclothes, sheets and blankets)?: Total Difficulty moving from lying on back to sitting on the side of the bed? : Total Difficulty sitting down on and standing up from a chair with arms (e.g., wheelchair, bedside commode, etc,.)?: Total Help needed moving to and from a bed to chair (including a wheelchair)?: Total Help needed walking in hospital room?: Total Help needed climbing 3-5 steps with a railing? : Total 6 Click Score: 6    End of Session Equipment Utilized During Treatment: Oxygen Activity Tolerance: Patient limited by lethargy;Patient limited by fatigue Patient left: in chair;with call bell/phone within reach;with chair alarm set Nurse Communication: Mobility status;Need for lift equipment PT Visit Diagnosis: Other abnormalities of gait and mobility (R26.89);Unsteadiness on feet (R26.81);Hemiplegia and hemiparesis Hemiplegia - Right/Left: Left Hemiplegia - dominant/non-dominant: Non-dominant Hemiplegia - caused by: Cerebral infarction     Time: 1610-9604 PT Time Calculation (min) (ACUTE ONLY): 31 min  Charges:  $Therapeutic Activity: 8-22 mins                    G Codes:       Charlotte Crumb, PT DPT  [phone]    Fabio Asa 04/10/2017, 11:19 AM

## 2017-04-10 NOTE — Discharge Summary (Signed)
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 s/p intervention and R ICA stent Active Problems:   Essential hypertension   Acute encephalopathy   Electrolyte imbalance   Dysarthria, post-stroke   Dysphagia, post-stroke   Tachypnea   Prediabetes   Leukocytosis   Hypophosphatemia   Stage 2 chronic kidney disease   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  Past Medical History:  Diagnosis Date  . No pertinent past medical history    Past Surgical History:  Procedure Laterality Date  . APPENDECTOMY    . ESOPHAGOGASTRODUODENOSCOPY N/A 04/07/2017   Procedure: ESOPHAGOGASTRODUODENOSCOPY (EGD);  Surgeon: Violeta Gelinas, MD;  Location: [facility] ENDOSCOPY;  Service: Endoscopy;  Laterality: N/A;  . IR ANGIO INTRA EXTRACRAN SEL COM CAROTID INNOMINATE UNI L MOD SED  03/31/2017  . IR INTRAVSC STENT CERV CAROTID W/O EMB-PROT MOD SED INC ANGIO  03/31/2017  . IR PERCUTANEOUS ART THROMBECTOMY/INFUSION INTRACRANIAL INC DIAG ANGIO  03/31/2017  . IR US GUIDE VASC ACCESS RIGHT  03/31/2017  . LAPAROSCOPIC APPENDECTOMY  06/21/2012   Procedure: APPENDECTOMY LAPAROSCOPIC;  Surgeon: Kandis Cocking, MD;  Location: [facility] ORS;   Service: General;  Laterality: N/A;  . NO PAST SURGERIES    . PEG PLACEMENT N/A 04/07/2017   Procedure: PERCUTANEOUS ENDOSCOPIC GASTROSTOMY (PEG) PLACEMENT;  Surgeon: Violeta Gelinas, MD;  Location: [facility] ENDOSCOPY;  Service: Endoscopy;  Laterality: N/A;  . RADIOLOGY WITH ANESTHESIA N/A 03/31/2017   Procedure: RADIOLOGY WITH ANESTHESIA;  Surgeon: Gilmer Mor, DO;  Location: [facility] OR;  Service: Anesthesiology;  Laterality: N/A;    Allergies as of 04/12/2017      Reactions   No Known Allergies       Medication List    STOP taking these medications   b complex vitamins tablet   [employer] PROBIOTIC (LACTOBACILLUS) Caps   Garlic 1000 MG Caps   magnesium 30 MG tablet   multivitamin with minerals Tabs tablet   predniSONE 10 MG tablet Commonly known as:  DELTASONE   psyllium 28 % packet Commonly known as:  METAMUCIL SMOOTH TEXTURE   Vitamin D 400 units capsule     TAKE these medications   amantadine 50 MG/5ML solution Commonly known as:  SYMMETREL Place 20 mLs (200 mg total) into feeding tube 2 (two) times daily.   amoxicillin-clavulanate 400-57 MG/5ML suspension Commonly known as:  AUGMENTIN Place 10 mLs (800 mg total) into feeding tube every 12 (twelve) hours.   aspirin 325 MG tablet Place 1 tablet (325 mg total) into feeding tube daily. Start taking on:  04/13/2017   atorvastatin 40 MG tablet Commonly known as:  LIPITOR Place 1 tablet (40 mg total) into feeding tube daily  at 6 PM.   baclofen 10 mg/mL Susp Commonly known as:  LIORESAL Take 1 mL (10 mg total) by mouth 3 (three) times daily.   clopidogrel 75 MG tablet Commonly known as:  PLAVIX Place 1 tablet (75 mg total) into feeding tube daily. Start taking on:  04/13/2017   famotidine 20 MG tablet Commonly known as:  PEPCID Place 1 tablet (20 mg total) into feeding tube 2 (two) times daily.   feeding supplement (JEVITY 1.2 CAL) Liqd Place 1,000 mLs into feeding tube continuous.   insulin aspart 100 UNIT/ML  injection Commonly known as:  novoLOG Inject 0-15 Units into the skin every 4 (four) hours.   insulin aspart 100 UNIT/ML injection Commonly known as:  novoLOG Inject 8 Units into the skin every 4 (four) hours.   ipratropium-albuterol 0.5-2.5 (3) MG/3ML Soln Commonly known as:  DUONEB Take 3 mLs by nebulization every 4 (four) hours as needed.   ipratropium-albuterol 0.5-2.5 (3) MG/3ML Soln Commonly known as:  DUONEB Take 3 mLs by nebulization 3 (three) times daily.       LABORATORY STUDIES CBC    Component Value Date/Time   WBC 14.8 (H) 04/12/2017 0417   RBC 3.91 (L) 04/12/2017 0417   HGB 11.8 (L) 04/12/2017 0417   HCT 35.4 (L) 04/12/2017 0417   PLT 236 04/12/2017 0417   MCV 90.5 04/12/2017 0417   MCV 93.3 09/08/2014 1302   MCH 30.2 04/12/2017 0417   MCHC 33.3 04/12/2017 0417   RDW 12.4 04/12/2017 0417   LYMPHSABS 1.1 04/11/2017 1238   MONOABS 1.1 (H) 04/11/2017 1238   EOSABS 0.1 04/11/2017 1238   BASOSABS 0.0 04/11/2017 1238   CMP    Component Value Date/Time   NA 135 04/12/2017 0417   K 3.8 04/12/2017 0417   CL 100 (L) 04/12/2017 0417   CO2 26 04/12/2017 0417   GLUCOSE 246 (H) 04/12/2017 0417   BUN 18 04/12/2017 0417   CREATININE 1.17 04/12/2017 0417   CREATININE 1.16 08/10/2016 1324   CALCIUM 8.2 (L) 04/12/2017 0417   PROT 6.8 03/31/2017 1040   ALBUMIN 4.0 03/31/2017 1040   AST 31 03/31/2017 1040   ALT 29 03/31/2017 1040   ALKPHOS 61 03/31/2017 1040   BILITOT 0.9 03/31/2017 1040   GFRNONAA 59 (L) 04/12/2017 0417   GFRAA >60 04/12/2017 0417   COAGS Lab Results  Component Value Date   INR 1.07 03/31/2017   Lipid Panel    Component Value Date/Time   CHOL 148 04/01/2017 0442   TRIG 109 04/01/2017 0442   HDL 27 (L) 04/01/2017 0442   CHOLHDL 5.5 04/01/2017 0442   VLDL 22 04/01/2017 0442   LDLCALC 99 04/01/2017 0442   HgbA1C  Lab Results  Component Value Date   HGBA1C 6.2 (H) 04/01/2017   Urinalysis    Component Value Date/Time   COLORURINE  YELLOW 04/10/2017 1230   APPEARANCEUR CLOUDY (A) 04/10/2017 1230   LABSPEC 1.014 04/10/2017 1230   PHURINE 9.0 (H) 04/10/2017 1230   GLUCOSEU >=500 (A) 04/10/2017 1230   HGBUR MODERATE (A) 04/10/2017 1230   BILIRUBINUR NEGATIVE 04/10/2017 1230   BILIRUBINUR neg 04/02/2015 1432   KETONESUR NEGATIVE 04/10/2017 1230   PROTEINUR 30 (A) 04/10/2017 1230   UROBILINOGEN 1.0 04/02/2015 1432   UROBILINOGEN 0.2 04/23/2014 1238   NITRITE NEGATIVE 04/10/2017 1230   LEUKOCYTESUR TRACE (A) 04/10/2017 1230     SIGNIFICANT DIAGNOSTIC STUDIES Ct Head Code Stroke W/o Cm 03/31/2017 1. Emergent right MCA large vessel occlusion suspected. No associated  hemorrhage or mass effect.  2. ASPECTS is 9.   Ct Angio Head W Or Wo Contrast Ct Angio Neck W Or Wo Contrast Ct Cerebral Perfusion W Contrast 03/31/2017 1. Positive for emergent large vessel occlusion, and favorable CT perfusion findings for endovascular re-perfusion.  2. Occlusion of the Right ICA just beyond its origin. Reconstitution of the Right ICA terminus beginning at the anterior genu (perhaps via the right ophthalmic artery). But superimposed Right MCA mid M1 segment occlusion.  3. CT perfusion indicates a core infarct of 40 mL in the Right MCA territory and right hemisphere penumbra of 146 mL.  4. The above was relayed to Dr. Ritta Slot beginning at 1059 hours.  5. Stenosis of the right ACA origin, but the right A1 segment may be non dominant. Right A2 and distal ACA enhancement is within normal limits. Normal left MCA and left ACA.  6. Aortic arch and bilateral carotid atherosclerosis without additional stenosis.  7. Mild left vertebral artery origin stenosis due to soft plaque. Otherwise negative posterior circulation.  8. Calcified coronary artery atherosclerosis.  Cerebral Angiogram  03/31/2017 Treatment of tandem occlusion of the right ICA and right M1, with emergent stent system of the right ICA for treatment of dissection and  intraluminal thrombus, and mechanical thrombectomy of the right MCA and carotid siphon, with restoration of TICI 3 flow. Deployment of Exoseal for hemostasis.  Ct Head Wo Contrast 03/31/2017 1. Extensive areas of hyperdensity involving the basal ganglia and right MCA territory cortex likely reflecting luxury perfusion associated with the areas of acute infarction following revascularization. Hemorrhage is not entirely excluded, but felt to be a minority of the hyperdensity visualized. The areas largely correspond to the areas of core infarction on the CTP study.  2. A tiny amount of subarachnoid hemorrhage may be present without intraventricular hemorrhage.   Ct Head Wo Contrast 04/01/2017 Developing bland and hemorrhagic infarction in the RIGHT hemisphere corresponding roughly to the area of infarcted core of brain on recent CT perfusion.  MRI brain  04/01/17 Acute large RIGHT MCA territory infarct with similar petechial hemorrhage.  Ct Head Wo Contrast  04/08/2017 Evolving RIGHT MCA territory infarct with faint residual petechial hemorrhage.  Dg Chest Port 1 View 04/09/2017 Symmetric opacities at the bases which could be atelectasis or pneumonia. If present, pleural effusions would be small.      HISTORY OF PRESENT ILLNESS Norton Bivins Bramlettis a 76 y.o.malewho was last in his normal state of health definitely yesterday evening 03/30/2017 ([facility]) when he interacted with someone at his facility. 4 AM, he got up and went to the bathroom and felt normal and that time. When he tried it up for more to the bathroom, however, he fell and was unable to get up. He was then unable to get to a phone for about 6 hours and once he finally did, he called 911 and was brought in as a code stroke. He was not given tPA as he was out of the window. He was admitted for further evaluation and treatment.    HOSPITAL COURSE Mr. JORDI LACKO is a 76 y.o. male with no significant past medical history  presenting with left hemiparesis. He did not receive IV t-PA due to late presentation. Status post IR thrombectomy and R ICA rescue stenting  Stroke - right MCA infarct with hemorrhagic transformation s/p thrombectomy and right ICA stenting. Etiology unclear  Resultant  left hemiplegia, R gaze preference, L facial droop  MRI - Acute large RIGHT MCA  territory infarct with similar petechial hemorrhage.  CTA H&N - Rt ICA occlusion. Superimposed Right MCA mid M1 segment occlusion  CT head - right MCA territory hemorrhagic transformation  Ct Head repeat - Evolving RIGHT MCA territory infarct with faint residual petechial hemorrhage.  2D Echo - Normal LV function; trace MR and TR; mildly elevated pulmonary pressure  Not good candidate for TEE and loop at this time, will need 30 day cardiac event monitoring as outpt to rule out afib.  LDL - 99  HgbA1c 6.2  No antithrombotic prior to admission, now on plavix 75 and ASA 325mg . Continue DAPT on discharge due to carotid stenting.  Ongoing aggressive stroke risk factor management  Therapy recommendations: SNF  Disposition: SNF  ICA occlusion, right  s/p CAS  On ASA and plavix now  Continue DAPT on discharge  BP goal 130-150  Hypertension  stable  Long-term BP goal normotensive  Hyperlipidemia  Home meds: No lipid lowering medications prior to admission  LDL 99, goal < 70  Start Lipitor 40 mg daily   Continue statin at discharge  Hyperglycemia/Pre-diabetes  A1C 6.2  On SSI  DM coordinator consulted  Added insulin Q4h for TF coverage.   Glucoses remain elevated at time of discharge  Increased to 8 units Q4h for TF coverage at discharge. Hold if diet stopped or held.  Leukocytosis  Probable pneumonia Urinary tract infection  Low grade fever  WBC 8.2->18.2->14.8  UA WBC 6-30  Urine culture > 100K GNR, sensitivity pending. Will follow sensitivity. If not sensitive to augmentin, will alert facility  and change abx.   CXR concerning for pneumonia  On Unasyn for tx. Changed to augmentin. Continue for 5 days then stop.   Dysphagia  Did not pass swallow  s/p PEG 04/07/17  On TF  abd binder on to prevent dislodgement  Hiccup was on reglan 10mg  tid, did not tolerate thorazine due to drowsiness  Now on baclofen 10mg  tid  Other Stroke Risk Factors  Advanced age  The patient quit smoking 21 years ago.  Obesity, Body mass index is 30.51 kg/m., recommend weight loss, diet and exercise as appropriate   Family hx stroke (mother)  Other Active Problems  Hypokalemia, resolved - 3.8  Mild anemia - stable 11.8  Lethargic related to thorazine which is d/c'ed. May also be related to UTI. Put on amantadine for arousal - will continue for 5 more days then stop.   DISCHARGE EXAM Blood pressure 132/73, pulse 73, temperature 97.5 F (36.4 C), temperature source Oral, resp. rate 18, height 5' 6.93" (1.7 m), weight 92.1 kg (203 lb), SpO2 97 %. Elderly Caucasian male. Head is nontraumatic. Neck is supple without bruit.  Cardiac exam no murmur or gallop. Lungs are clear to auscultation. Distal pulses are well felt.  Neurological Exam :  Awake alert, follows midline and peripheral commands on right side. Mild dysarthria, paucity of speech, knows the month and place, but not year or age. PREEL, right gaze preference barely across midline. Fundi not visualized. Left lower face weakness. Tongue midline. Left hemiplegia. Purposeful antigravity movements on the right side and against gravity. Left plantar upgoing right downgoing. Sensation and coordination not cooperative, gait not tested.  Discharge Diet   Diet NPO time specified liquids  DISCHARGE PLAN  Disposition:  Discharge to skilled nursing facility for ongoing PT, OT and ST.   aspirin 81 mg daily and clopidogrel 75 mg daily given stent placement and for secondary stroke prevention.  BP goal 130-150  Monitor  CBGs and adjust meal  coverage if remains high. Hold if NPO or decreased intake.  Urine culture sensitivity pending. If not sensitive to augmentin, will alert facility and change abx.  Ongoing risk factor control by Primary Care Physician at time of discharge  Follow-up Patient, No Pcp Per in 2 weeks.  Follow-up with Dr. Marvel PlanJindong Keyana Guevara, Stroke Clinic in 6 weeks, office to schedule an appointment.  50 minutes were spent preparing discharge.  Marvel PlanJindong Maimouna Rondeau, MD PhD Stroke Neurology 04/12/2017 2:02 PM

## 2017-04-10 NOTE — Progress Notes (Signed)
STROKE TEAM PROGRESS NOTE   SUBJECTIVE (INTERVAL HISTORY) No family at bedside. Pt still lethargic, concerning side effect from repeated thorazine dose. On reglan also for intermittent hiccup. Still has low grade fever and elevated WBC. CXR concerning for aspiration pneumonia. UA WBC 6-30.   OBJECTIVE Temp:  [98.2 F (36.8 C)-100.2 F (37.9 C)] (P) 98.4 F (36.9 C) (05/07 1340) Pulse Rate:  [78-111] (P) 108 (05/07 1340) Cardiac Rhythm: Normal sinus rhythm (05/07 0800) Resp:  [18-20] (P) 18 (05/07 1340) BP: (143-151)/(68-81) (P) 144/78 (05/07 1340) SpO2:  [95 %-100 %] (P) 100 % (05/07 1340) Weight:  [209 lb (94.8 kg)] 209 lb (94.8 kg) (05/07 0600)  CBC:   Recent Labs Lab 04/04/17 0538 04/05/17 0356  04/09/17 0349 04/10/17 0325  WBC 9.1 8.2  < > 11.6* 11.2*  NEUTROABS 7.1 5.9  --   --   --   HGB 12.2* 12.2*  < > 12.5* 12.4*  HCT 35.8* 34.7*  < > 36.7* 36.5*  MCV 90.2 90.4  < > 88.6 88.8  PLT 114* 142*  < > 164 187  < > = values in this interval not displayed.  Basic Metabolic Panel:   Recent Labs Lab 04/04/17 0538 04/05/17 0356  04/09/17 0349 04/10/17 0325  NA  --   --   < > 139 136  K  --   --   < > 3.7 3.7  CL  --   --   < > 104 102  CO2  --   --   < > 24 25  GLUCOSE  --   --   < > 173* 239*  BUN  --   --   < > 12 13  CREATININE  --   --   < > 0.95 1.02  CALCIUM  --   --   < > 8.1* 8.2*  MG 2.0 1.9  --   --   --   PHOS 1.8* 2.3*  --  2.2*  --   < > = values in this interval not displayed.  Lipid Panel:     Component Value Date/Time   CHOL 148 04/01/2017 0442   TRIG 109 04/01/2017 0442   HDL 27 (L) 04/01/2017 0442   CHOLHDL 5.5 04/01/2017 0442   VLDL 22 04/01/2017 0442   LDLCALC 99 04/01/2017 0442   HgbA1c:  Lab Results  Component Value Date   HGBA1C 6.2 (H) 04/01/2017   Urine Drug Screen:     Component Value Date/Time   LABOPIA NONE DETECTED 12/13/2009 2102   COCAINSCRNUR NONE DETECTED 12/13/2009 2102   LABBENZ NONE DETECTED 12/13/2009 2102    AMPHETMU NONE DETECTED 12/13/2009 2102   THCU NONE DETECTED 12/13/2009 2102   LABBARB  12/13/2009 2102    NONE DETECTED        DRUG SCREEN FOR MEDICAL PURPOSES ONLY.  IF CONFIRMATION IS NEEDED FOR ANY PURPOSE, NOTIFY LAB WITHIN 5 DAYS.        LOWEST DETECTABLE LIMITS FOR URINE DRUG SCREEN Drug Class       Cutoff (ng/mL) Amphetamine      1000 Barbiturate      200 Benzodiazepine   200 Tricyclics       300 Opiates          300 Cocaine          300 THC              50    Alcohol Level     Component Value  Date/Time   [facility]  12/13/2009 2140    <5        LOWEST DETECTABLE LIMIT FOR SERUM ALCOHOL IS 5 mg/dL FOR MEDICAL PURPOSES ONLY    IMAGING I have personally reviewed the radiological images below and agree with the radiology interpretations.  Ct Angio Head W Or Wo Contrast Ct Angio Neck W Or Wo Contrast Ct Cerebral Perfusion W Contrast 03/31/2017 1. Positive for emergent large vessel occlusion, and favorable CT perfusion findings for endovascular re-perfusion.  2. Occlusion of the Right ICA just beyond its origin. Reconstitution of the Right ICA terminus beginning at the anterior genu (perhaps via the right ophthalmic artery). But superimposed Right MCA mid M1 segment occlusion.  3. CT perfusion indicates a core infarct of 40 mL in the Right MCA territory and right hemisphere penumbra of 146 mL.  4. The above was relayed to Dr. Ritta Slot beginning at 1059 hours.  5. Stenosis of the right ACA origin, but the right A1 segment may be non dominant. Right A2 and distal ACA enhancement is within normal limits. Normal left MCA and left ACA.  6. Aortic arch and bilateral carotid atherosclerosis without additional stenosis.  7. Mild left vertebral artery origin stenosis due to soft plaque. Otherwise negative posterior circulation.  8. Calcified coronary artery atherosclerosis.  Ct Head Wo Contrast 04/01/2017 Developing bland and hemorrhagic infarction in the RIGHT hemisphere  corresponding roughly to the area of infarcted core of brain on recent CT perfusion.  Ct Head Wo Contrast 03/31/2017 1. Extensive areas of hyperdensity involving the basal ganglia and right MCA territory cortex likely reflecting luxury perfusion associated with the areas of acute infarction following revascularization. Hemorrhage is not entirely excluded, but felt to be a minority of the hyperdensity visualized. The areas largely correspond to the areas of core infarction on the CTP study.  2. A tiny amount of subarachnoid hemorrhage may be present without intraventricular hemorrhage.   Ct Head Code Stroke W/o Cm 03/31/2017 1. Emergent right MCA large vessel occlusion suspected. No associated hemorrhage or mass effect.  2. ASPECTS is 9.   MRI brain  04/01/17 Acute large RIGHT MCA territory infarct with similar petechial hemorrhage.  Ct Head Wo Contrast  04/08/2017 Evolving RIGHT MCA territory infarct with faint residual petechial hemorrhage.  Dg Chest Port 1 View 04/09/2017 IMPRESSION: Symmetric opacities at the bases which could be atelectasis or pneumonia. If present, pleural effusions would be small.     PHYSICAL EXAM Elderly Caucasian male. Head is nontraumatic. Neck is supple without bruit.  Cardiac exam no murmur or gallop. Lungs are clear to auscultation. Distal pulses are well felt.  Neurological Exam :  Drowsy but able to arouse and follows midline and commands on right side. Severe dysarthria, paucity of speech, knows the month but did not answer further orientation questions. PREEL, right gaze preference barely across midline. Fundi not visualized. Left lower face weakness. Tongue midline. Left hemiplegia. Purposeful antigravity movements on the right side, at least 3/5. Left plantar upgoing right downgoing.   ASSESSMENT/PLAN [name] is a 76 y.o. male with no significant past medical history presenting with left hemiparesis. He did not receive IV t-PA due to late  presentation. 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  Resultant  Left hemiplegia  MRI - Acute large RIGHT MCA territory infarct with similar petechial hemorrhage.  CTA H&N - Rt ICA occlusion. Superimposed Right MCA mid  M1 segment occlusion  CT head - right MCA territory hemorrhagic transformation  Ct Head repeat - Evolving RIGHT MCA territory infarct with faint residual petechial hemorrhage.  2D Echo - Normal LV function; trace MR and TR; mildly elevated pulmonary pressure  LDL - 99  HgbA1c 6.2   VTE prophylaxis - lovenox  Diet NPO time specified  No antithrombotic prior to admission, now on plavix 75 and ASA 325mg . Continue DAPT on discharge due to carotid stenting.  Patient will be counseled to be compliant with his antithrombotic medications  Ongoing aggressive stroke risk factor management  Therapy recommendations: SNF  Disposition: Pending  ICA occlusion, right  s/p CAS  On ASA and plavix now  Continue DAPT on discharge  BP goal 130-150  Hypertension  stable Long-term BP goal normotensive  Hyperlipidemia  Home meds: No lipid lowering medications prior to admission  LDL 99, goal < 70  Start Lipitor 40 mg daily   Continue statin at discharge  Hyperglycemia  A1C 6.2  On SSI  CBG high Q4h with TF  DM coordinator consult  Leukocytosis   Low grade fever  CBC 8.2->9.8->11.6->11.2  Continue monitoring  CXR concerning for pneumonia  On Unasyn for tx  Dysphagia  Did not pass swallow  s/p PEG 04/07/17  On TF  Hiccup on reglan 10mg  tid   Other Stroke Risk Factors  Advanced age  The patient quit smoking 21 years ago.  Obesity, Body mass index is 32.8 kg/m., recommend weight loss, diet and exercise as appropriate   Family hx stroke (mother)  Other Active Problems  Hypokalemia - 3.2 -> 3.7->3.7  Mild anemia -  stable  Lethargic - concerning for side effect from thorazine which is d/c'ed - put on amantadine for arousal  Hospital day # 10  Marvel Plan, MD PhD Stroke Neurology 04/10/2017 3:58 PM   To contact Stroke Continuity provider, please refer to [email or web address]. After hours, contact General Neurology

## 2017-04-10 NOTE — Clinical Social Work Note (Signed)
[facility]    [place] MountainBridget Chante [place], [place]CSWA [phone]

## 2017-04-10 NOTE — Progress Notes (Signed)
CSW received [employer] auth: [id number]: #[id number], renewal date of 04/12/17. Rug level RUC. CS[facility]. Per MD, patient not medically ready for discharge. CSW informed [facility] of updated discharge information. CSW will continue to follow to facilitate discharge needs.   Blenda Nicelylizabeth Xayla Puzio LCSW [phone]

## 2017-04-10 NOTE — Care Management Note (Signed)
Case Management Note  Patient Details  Name: Matthew Meza MRN: [id number] Date of Birth: 1941/05/02  Subjective/Objective:                    Action/Plan: Pt with lethargy and fevers. Plan is for SNF when medically ready. CM following.  Expected Discharge Date:                  Expected Discharge Plan:  Skilled Nursing Facility  In-House Referral:  Clinical Social Work  Discharge planning Services     Post Acute Care Choice:    Choice offered to:     DME Arranged:    DME Agency:     HH Arranged:    HH Agency:     Status of Service:  In process, will continue to follow  If discussed at Long Length of Stay Meetings, dates discussed:    Additional Comments:  Matthew BaloKelli F Bahja Bence, RN 04/10/2017, 3:34 PM

## 2017-04-10 NOTE — Progress Notes (Signed)
Occupational Therapy Treatment Patient Details Name: Matthew Meza MRN: [id number] DOB: 1941-11-10 Today's Date: 04/10/2017    History of present illness 76 yo admitted with Rt MCA CVA  emergent stent system of the right ICA for treatment of dissection and intraluminal thrombus, and mechanical thrombectomy of the right MCA and carotid siphon, VDRF 4/27-4/29. No significant PMHx. Coretrac placed 5/3.    OT comments  Pt progressing toward OT goals. Pt demonstrates improved ability to complete grooming tasks with min assist and is able to attend to L UE with multimodal cues. L UE remains flaccid and [facility]. He was limited by lethargy this session requiring max multimodal cues for engagement in ADL and attention to therapeutic activities this session. He was able to maintain midline postural control for approximately 10 minutes with mod-max hands on facilitation. D/C recommendation remains appropriate. OT will continue to follow acutely.    Follow Up Recommendations  SNF;Supervision/Assistance - 24 hour    Equipment Recommendations   (TBD at next venue of care)    Recommendations for Other Services      Precautions / Restrictions Precautions Precautions: Fall Precaution Comments: left hemiplegia, neglect Restrictions Weight Bearing Restrictions: No       Mobility Bed Mobility Overal bed mobility: Needs Assistance Bed Mobility: Sit to Supine;Sidelying to Sit;Rolling;Supine to Sit Rolling: Max assist Sidelying to sit: Max assist;+2 for physical assistance Supine to sit: Total assist;+2 for physical assistance Sit to supine: Total assist;+2 for physical assistance   General bed mobility comments: Lethargy limiting pt's ability to participate this session.   Transfers Overall transfer level: Needs assistance   Transfers: Licensed conveyancerAnterior-Posterior Transfer       Anterior-Posterior transfers: Total assist;+2 physical assistance (+3 assist)   General transfer  comment: Pt highly lethargic and falling alseep throughout session.     Balance Overall balance assessment: Needs assistance Sitting-balance support: Single extremity supported;Feet supported Sitting balance-Leahy Scale: Poor   Postural control: Left lateral lean (pushing to the L)                                 ADL either performed or assessed with clinical judgement   ADL Overall ADL's : Needs assistance/impaired Eating/Feeding: NPO   Grooming: Bed level;Minimal assistance Grooming Details (indicate cue type and reason): able to wash face when given washcloth with min assist.                 Toilet Transfer: +2 for safety/equipment;+2 for physical assistance;Anterior/posterior;Total assistance Toilet Transfer Details (indicate cue type and reason): simulated to recliner           General ADL Comments: Total assist required for mobility this session as pt highly lethargic. Facilitated improved core stability in sitting position in preparation for seated grooming tasks.     Vision   Additional Comments: Pt able to cross midline gaze briefly to the L to track with therapist. Poor spatial awareness when reaching for items. R gaze preference noted.    Perception     Praxis      Cognition Arousal/Alertness: Lethargic Behavior During Therapy: Flat affect Overall Cognitive Status: Impaired/Different from baseline Area of Impairment: Attention;Memory;Safety/judgement;Awareness;Problem solving;Following commands                 Orientation Level: Disoriented to;Time Current Attention Level: Sustained Memory: Decreased short-term memory Following Commands: Follows one step commands consistently Safety/Judgement: Decreased awareness of safety;Decreased awareness of deficits  Awareness: Emergent Problem Solving: Slow processing;Decreased initiation;Difficulty sequencing;Requires verbal cues;Requires tactile cues General Comments: Pt highly lethargic  this session requiring maximum multimodal cues to maintain eyes open. Pt asking "what time are coffee and donuts?" Initially requiring max multimodal cues to follow commands but by end of session requiring min assist.        Exercises Exercises: Other exercises Other Exercises Other Exercises: Facilitated L UE weightbearing in seated position with pt unable to engage core to maintain during R UE reaching tasks.  Other Exercises: Pt able to engage core with tactile and verbal cues to maintain upright posture for ADL seated at EOB. Engagement in task limited by lethargy.   Shoulder Instructions       General Comments      Pertinent Vitals/ Pain       Pain Assessment: No/denies pain  Home Living                                          Prior Functioning/Environment              Frequency  Min 2X/week        Progress Toward Goals  OT Goals(current goals can now be found in the care plan section)  Progress towards OT goals: Progressing toward goals  Acute Rehab OT Goals Patient Stated Goal: eat donuts and coffee OT Goal Formulation: With patient Time For Goal Achievement: 04/17/17 Potential to Achieve Goals: Good ADL Goals Pt Will Perform Grooming: with supervision;sitting;with set-up Pt Will Perform Upper Body Bathing: with min assist;sitting Pt Will Transfer to Toilet: with +2 assist;with mod assist;bedside commode Additional ADL Goal #1: Pt will identify 3/3 targets in L visual field with min vc in  nondistracting environment Additional ADL Goal #2: Pt will maintain midline postural control sitting EOB x 5 min with min vc in preparation for ADL tasks.  Plan Discharge plan remains appropriate;Frequency remains appropriate    Co-evaluation    PT/OT/SLP Co-Evaluation/Treatment: Yes Reason for Co-Treatment: Necessary to address cognition/behavior during functional activity;Complexity of the patient's impairments (multi-system involvement);For  patient/therapist safety   OT goals addressed during session: ADL's and self-care      AM-PAC PT "6 Clicks" Daily Activity     Outcome Measure   Help from another person eating meals?: Total Help from another person taking care of personal grooming?: A Lot Help from another person toileting, which includes using toliet, bedpan, or urinal?: Total Help from another person bathing (including washing, rinsing, drying)?: A Lot Help from another person to put on and taking off regular upper body clothing?: A Lot Help from another person to put on and taking off regular lower body clothing?: Total 6 Click Score: 9    End of Session Equipment Utilized During Treatment: Oxygen  OT Visit Diagnosis: Other abnormalities of gait and mobility (R26.89);Muscle weakness (generalized) (M62.81);Low vision, both eyes (H54.2);Other symptoms and signs involving cognitive function;Cognitive communication deficit (R41.841);Hemiplegia and hemiparesis Symptoms and signs involving cognitive functions: Cerebral infarction Hemiplegia - Right/Left: Left Hemiplegia - dominant/non-dominant: Non-Dominant Hemiplegia - caused by: Cerebral infarction   Activity Tolerance Patient tolerated treatment well   Patient Left in bed;with call bell/phone within reach;with bed alarm set;with SCD's reapplied   Nurse Communication Mobility status        Time: 1610-9604 OT Time Calculation (min): 31 min  Charges: OT General Charges $OT Visit: 1 Procedure OT Treatments $Therapeutic Activity:  8-22 mins  Doristine Section, MS OTR/L  Pager: [phone]    Matthew Meza 04/10/2017, 10:21 AM

## 2017-04-10 NOTE — Progress Notes (Signed)
Inpatient Diabetes Program Recommendations  AACE/ADA: New Consensus Statement on Inpatient Glycemic Control (2015)  Target Ranges:  Prepandial:   less than 140 mg/dL      Peak postprandial:   less than 180 mg/dL (1-2 hours)      Critically ill patients:  140 - 180 mg/dL   Lab Results  Component Value Date   GLUCAP 245 (H) 04/10/2017   HGBA1C 6.2 (H) 04/01/2017    Review of Glycemic Control Results for Matthew BoroughsBRAMLETT, Jontue J (MRN [id number]) as of 04/10/2017 08:59  Ref. Range 04/09/2017 16:26 04/09/2017 20:08 04/10/2017 00:08 04/10/2017 04:03 04/10/2017 07:47  Glucose-Capillary Latest Ref Range: 65 - 99 mg/dL 657251 (H) 846203 (H) 962204 (H) 241 (H) 245 (H)   Inpatient Diabetes Program Recommendations:    Please consider: -Novolog correction to q 4 hrs. -Tubefeed coverage q 4 hrs if continued hyperglycemia and hold if tube feed stopped for any reason Will follow.  Thank you, Billy FischerJudy E. Orlando Thalmann, RN, MSN, CDE  Diabetes Coordinator Inpatient Glycemic Control Team Team Pager [phone] (8am-5pm) 04/10/2017 9:03 AM

## 2017-04-11 ENCOUNTER — Inpatient Hospital Stay (HOSPITAL_COMMUNITY): Payer: Medicare HMO

## 2017-04-11 LAB — BASIC METABOLIC PANEL
Anion gap: 8 (ref 5–15)
BUN: 16 mg/dL (ref 6–20)
CALCIUM: 8.2 mg/dL — AB (ref 8.9–10.3)
CO2: 24 mmol/L (ref 22–32)
CREATININE: 1.16 mg/dL (ref 0.61–1.24)
Chloride: 102 mmol/L (ref 101–111)
GFR calc Af Amer: >60 mL/min (ref >60)
GFR, EST NON AFRICAN AMERICAN: 60 mL/min — AB (ref >60)
Glucose, Bld: 323 mg/dL — ABNORMAL HIGH (ref 65–99)
POTASSIUM: 4 mmol/L (ref 3.5–5.1)
SODIUM: 134 mmol/L — AB (ref 135–145)

## 2017-04-11 LAB — CBC WITH DIFFERENTIAL/PLATELET
Basophils Absolute: 0 10*3/uL (ref 0.0–0.1)
Basophils Relative: 0 %
EOS ABS: 0.1 10*3/uL (ref 0.0–0.7)
EOS PCT: 1 %
HCT: 35.4 % — ABNORMAL LOW (ref 39.0–52.0)
Hemoglobin: 12.1 g/dL — ABNORMAL LOW (ref 13.0–17.0)
LYMPHS ABS: 1.1 10*3/uL (ref 0.7–4.0)
Lymphocytes Relative: 6 %
MCH: 30.7 pg (ref 26.0–34.0)
MCHC: 34.2 g/dL (ref 30.0–36.0)
MCV: 89.8 fL (ref 78.0–100.0)
Monocytes Absolute: 1.1 10*3/uL — ABNORMAL HIGH (ref 0.1–1.0)
Monocytes Relative: 6 %
Neutro Abs: 15.9 10*3/uL — ABNORMAL HIGH (ref 1.7–7.7)
Neutrophils Relative %: 87 %
PLATELETS: 205 10*3/uL (ref 150–400)
RBC: 3.94 MIL/uL — AB (ref 4.22–5.81)
RDW: 12.3 % (ref 11.5–15.5)
WBC: 18.2 10*3/uL — AB (ref 4.0–10.5)

## 2017-04-11 LAB — GLUCOSE, CAPILLARY
GLUCOSE-CAPILLARY: 283 mg/dL — AB (ref 65–99)
GLUCOSE-CAPILLARY: 288 mg/dL — AB (ref 65–99)
Glucose-Capillary: 249 mg/dL — ABNORMAL HIGH (ref 65–99)
Glucose-Capillary: 253 mg/dL — ABNORMAL HIGH (ref 65–99)
Glucose-Capillary: 268 mg/dL — ABNORMAL HIGH (ref 65–99)
Glucose-Capillary: 272 mg/dL — ABNORMAL HIGH (ref 65–99)
Glucose-Capillary: 379 mg/dL — ABNORMAL HIGH (ref 65–99)

## 2017-04-11 MED ORDER — ATORVASTATIN CALCIUM 40 MG PO TABS
40.0000 mg | ORAL_TABLET | Freq: Every day | ORAL | Status: DC
  Administered 2017-04-11 – 2017-04-12 (×2): 40 mg
  Filled 2017-04-11 (×2): qty 1

## 2017-04-11 MED ORDER — CLOPIDOGREL BISULFATE 75 MG PO TABS
75.0000 mg | ORAL_TABLET | Freq: Every day | ORAL | Status: DC
  Administered 2017-04-11 – 2017-04-12 (×2): 75 mg
  Filled 2017-04-11: qty 1

## 2017-04-11 MED ORDER — ASPIRIN 325 MG PO TABS
325.0000 mg | ORAL_TABLET | Freq: Every day | ORAL | Status: DC
  Administered 2017-04-11 – 2017-04-12 (×2): 325 mg
  Filled 2017-04-11 (×2): qty 1

## 2017-04-11 MED ORDER — AMANTADINE HCL 50 MG/5ML PO SYRP
200.0000 mg | ORAL_SOLUTION | Freq: Two times a day (BID) | ORAL | Status: DC
  Administered 2017-04-11 – 2017-04-12 (×3): 200 mg
  Filled 2017-04-11 (×4): qty 20

## 2017-04-11 MED ORDER — FAMOTIDINE 20 MG PO TABS
20.0000 mg | ORAL_TABLET | Freq: Two times a day (BID) | ORAL | Status: DC
  Administered 2017-04-11 – 2017-04-12 (×3): 20 mg
  Filled 2017-04-11 (×2): qty 1

## 2017-04-11 MED ORDER — INSULIN ASPART 100 UNIT/ML ~~LOC~~ SOLN
5.0000 [IU] | SUBCUTANEOUS | Status: DC
  Administered 2017-04-11 – 2017-04-12 (×6): 5 [IU] via SUBCUTANEOUS

## 2017-04-11 MED ORDER — BACLOFEN 1 MG/ML ORAL SUSPENSION
10.0000 mg | Freq: Three times a day (TID) | ORAL | Status: DC
  Administered 2017-04-11 – 2017-04-12 (×4): 10 mg via ORAL
  Filled 2017-04-11 (×6): qty 1

## 2017-04-11 NOTE — Progress Notes (Signed)
Inpatient Diabetes Program Recommendations  AACE/ADA: New Consensus Statement on Inpatient Glycemic Control (2015)  Target Ranges:  Prepandial:   less than 140 mg/dL      Peak postprandial:   less than 180 mg/dL (1-2 hours)      Critically ill patients:  140 - 180 mg/dL   Lab Results  Component Value Date   GLUCAP 268 (H) 04/11/2017   HGBA1C 6.2 (H) 04/01/2017    Review of Glycemic Control Results for Matthew Meza, Matthew Meza (MRN [id number]) as of 04/11/2017 10:27  Ref. Range 04/10/2017 16:25 04/10/2017 19:35 04/10/2017 23:53 04/11/2017 04:10 04/11/2017 07:40  Glucose-Capillary Latest Ref Range: 65 - 99 mg/dL 010266 (H) 272225 (H) 536272 (H) 283 (H) 268 (H)   Inpatient Diabetes Program Recommendations:     Please consider: Hyperglycemia with tubefeeding. -Tubefeed coverage 5 units q 4 hrs hold if tube feed stopped for any reason  Thank you, Darel HongJudy E. Ebunoluwa Gernert, RN, MSN, CDE  Diabetes Coordinator Inpatient Glycemic Control Team Team Pager [phone] (8am-5pm) 04/11/2017 10:28 AM

## 2017-04-11 NOTE — Progress Notes (Signed)
Nutrition Follow Up  DOCUMENTATION CODES:   Obesity unspecified  INTERVENTION:    Continue Jevity 1.2 at goal rate of 75 ml/hr  TF regimen to providing 2160 kcals, 100 gm protein, 1453 ml of free water  NUTRITION DIAGNOSIS:   Inadequate oral intake related to inability to eat as evidenced by NPO status, ongoing   GOAL:   Patient will meet greater than or equal to 90% of their needs, met  MONITOR:   TF tolerance, Labs, Weight trends, I & O's  ASSESSMENT:   76 year old male presented with right MCA CVA s/p revascularization, required intubation.   Pt s/p procedure 4/27: IR PERCUTANEOUS ART THROMBECTOMY/INFUSION INTRACRANIAL INC DIAG ANGIO   Pt extubated 4/29. S/p MBSS 5/1.  SLP recommending continued NPO status. Jevity 1.2 formula infusing at goal rate of 75 ml/hr via PEG tube (placed 5/4). Medications reviewed and include Reglan.   CBG's 272-283-268.  Diet Order:  Diet NPO time specified  Skin:  Wound (see comment) (Stage II to ear)  Last BM:  5/7  Height:   Ht Readings from Last 1 Encounters:  03/31/17 5' 6.93" (1.7 m)   Weight:   Wt Readings from Last 1 Encounters:  04/11/17 194 lb 6.4 oz (88.2 kg)   Ideal Body Weight:  67.08 kg  BMI:  Body mass index is 30.51 kg/m.  Estimated Nutritional Needs:   Kcal:  2000-2200  Protein:  100-110 gm  Fluid:  2.0-2.2 L  EDUCATION NEEDS:   No education needs identified at this time  Arthur Holms, RD, LDN Pager #: [phone] After-Hours Pager #: [phone]

## 2017-04-11 NOTE — Progress Notes (Signed)
  Speech Language Pathology Treatment: Dysphagia;Cognitive-Linquistic  Patient Details Name: Kem BoroughsRaymond J Fahl MRN: [id number] DOB: [birth year] Today's Date: 04/11/2017 Time: [id number] SLP Time Calculation (min) (ACUTE ONLY): 14 min  Assessment / Plan / Recommendation Clinical Impression  Pt had wet coughing upon SLP arrival concerning for mismanagement of secretions. He did not have any oral expectoration, although vocal quality did clear. Weak coughing continues to follow trials of ice chips. SLP provided Max cues to attempt pharyngeal strengthening exercises. He was able to attempt an effortful swallow, but cognitively it was challenging for him to attempt the [facility] or a chin tuck against resistance due to difficulty following complex instructions. Mod-Max cues were provided for use speech intelligibility strategies with Mod cues also provided for topic maintenance. SLP will continue to follow.   HPI HPI: Mr.Earsel J Bramlettis a 76 y.o.malewith no significant past medical historypresenting with left hemiparesis.Hedidnot receive IV t-PA due to late presentation. Dx with hemorrhagic right MCA infarct. Status post IR thrombectomy and stenting 4/27, remained intubated post procedure. Extubated 4/29.      SLP Plan  Continue with current plan of care       Recommendations  Diet recommendations: NPO Medication Administration: Via alternative means                Oral Care Recommendations: Oral care QID Follow up Recommendations: Skilled Nursing facility SLP Visit Diagnosis: Dysphagia, unspecified (R13.10);Dysarthria and anarthria (R47.1);Cognitive communication deficit (W54.627(R41.841) Plan: Continue with current plan of care       GO                Maxcine Hamaiewonsky, Danijah Noh 04/11/2017, 3:05 PM  Maxcine HamLaura Paiewonsky, M.A. CCC-SLP [phone]

## 2017-04-11 NOTE — Progress Notes (Signed)
STROKE TEAM PROGRESS NOTE   SUBJECTIVE (INTERVAL HISTORY) No family at bedside. Pt more awake than yesterday, able to talk in sentences and eyes open, afebrile. Still has hiccup. WBC continue to elevate though.   OBJECTIVE Temp:  [98 F (36.7 C)-99.5 F (37.5 C)] 98 F (36.7 C) (05/08 1509) Pulse Rate:  [94-106] 94 (05/08 1509) Cardiac Rhythm: Sinus tachycardia;Bundle branch block (05/08 0800) Resp:  [18-22] 20 (05/08 1509) BP: (136-172)/(66-94) 136/66 (05/08 1509) SpO2:  [95 %-100 %] 95 % (05/08 1509) Weight:  [194 lb 6.4 oz (88.2 kg)-209 lb 1.6 oz (94.8 kg)] 194 lb 6.4 oz (88.2 kg) (05/08 0500)  CBC:   Recent Labs Lab 04/05/17 0356  04/10/17 0325 04/11/17 1238  WBC 8.2  < > 11.2* 18.2*  NEUTROABS 5.9  --   --  15.9*  HGB 12.2*  < > 12.4* 12.1*  HCT 34.7*  < > 36.5* 35.4*  MCV 90.4  < > 88.8 89.8  PLT 142*  < > 187 205  < > = values in this interval not displayed.  Basic Metabolic Panel:   Recent Labs Lab 04/05/17 0356  04/09/17 0349 04/10/17 0325 04/11/17 1238  NA  --   < > 139 136 134*  K  --   < > 3.7 3.7 4.0  CL  --   < > 104 102 102  CO2  --   < > 24 25 24   GLUCOSE  --   < > 173* 239* 323*  BUN  --   < > 12 13 16   CREATININE  --   < > 0.95 1.02 1.16  CALCIUM  --   < > 8.1* 8.2* 8.2*  MG 1.9  --   --   --   --   PHOS 2.3*  --  2.2*  --   --   < > = values in this interval not displayed.  Lipid Panel:     Component Value Date/Time   CHOL 148 04/01/2017 0442   TRIG 109 04/01/2017 0442   HDL 27 (L) 04/01/2017 0442   CHOLHDL 5.5 04/01/2017 0442   VLDL 22 04/01/2017 0442   LDLCALC 99 04/01/2017 0442   HgbA1c:  Lab Results  Component Value Date   HGBA1C 6.2 (H) 04/01/2017   Urine Drug Screen:     Component Value Date/Time   LABOPIA NONE DETECTED 12/13/2009 2102   COCAINSCRNUR NONE DETECTED 12/13/2009 2102   LABBENZ NONE DETECTED 12/13/2009 2102   AMPHETMU NONE DETECTED 12/13/2009 2102   THCU NONE DETECTED 12/13/2009 2102   LABBARB  12/13/2009  2102    NONE DETECTED        DRUG SCREEN FOR MEDICAL PURPOSES ONLY.  IF CONFIRMATION IS NEEDED FOR ANY PURPOSE, NOTIFY LAB WITHIN 5 DAYS.        LOWEST DETECTABLE LIMITS FOR URINE DRUG SCREEN Drug Class       Cutoff (ng/mL) Amphetamine      1000 Barbiturate      200 Benzodiazepine   200 Tricyclics       300 Opiates          300 Cocaine          300 THC              50    Alcohol Level     Component Value Date/Time   [facility]  12/13/2009 2140    <5        LOWEST DETECTABLE LIMIT FOR SERUM ALCOHOL IS 5 mg/dL  FOR MEDICAL PURPOSES ONLY    IMAGING I have personally reviewed the radiological images below and agree with the radiology interpretations.  Ct Angio Head W Or Wo Contrast Ct Angio Neck W Or Wo Contrast Ct Cerebral Perfusion W Contrast 03/31/2017 1. Positive for emergent large vessel occlusion, and favorable CT perfusion findings for endovascular re-perfusion.  2. Occlusion of the Right ICA just beyond its origin. Reconstitution of the Right ICA terminus beginning at the anterior genu (perhaps via the right ophthalmic artery). But superimposed Right MCA mid M1 segment occlusion.  3. CT perfusion indicates a core infarct of 40 mL in the Right MCA territory and right hemisphere penumbra of 146 mL.  4. The above was relayed to Dr. Ritta Slot beginning at 1059 hours.  5. Stenosis of the right ACA origin, but the right A1 segment may be non dominant. Right A2 and distal ACA enhancement is within normal limits. Normal left MCA and left ACA.  6. Aortic arch and bilateral carotid atherosclerosis without additional stenosis.  7. Mild left vertebral artery origin stenosis due to soft plaque. Otherwise negative posterior circulation.  8. Calcified coronary artery atherosclerosis.  Ct Head Wo Contrast 04/01/2017 Developing bland and hemorrhagic infarction in the RIGHT hemisphere corresponding roughly to the area of infarcted core of brain on recent CT perfusion.  Ct Head Wo  Contrast 03/31/2017 1. Extensive areas of hyperdensity involving the basal ganglia and right MCA territory cortex likely reflecting luxury perfusion associated with the areas of acute infarction following revascularization. Hemorrhage is not entirely excluded, but felt to be a minority of the hyperdensity visualized. The areas largely correspond to the areas of core infarction on the CTP study.  2. A tiny amount of subarachnoid hemorrhage may be present without intraventricular hemorrhage.   Ct Head Code Stroke W/o Cm 03/31/2017 1. Emergent right MCA large vessel occlusion suspected. No associated hemorrhage or mass effect.  2. ASPECTS is 9.   MRI brain  04/01/17 Acute large RIGHT MCA territory infarct with similar petechial hemorrhage.  Ct Head Wo Contrast  04/08/2017 Evolving RIGHT MCA territory infarct with faint residual petechial hemorrhage.  Dg Chest Port 1 View 04/09/2017 IMPRESSION: Symmetric opacities at the bases which could be atelectasis or pneumonia. If present, pleural effusions would be small.     PHYSICAL EXAM Elderly Caucasian male. Head is nontraumatic. Neck is supple without bruit.  Cardiac exam no murmur or gallop. Lungs are clear to auscultation. Distal pulses are well felt.  Neurological Exam :  Awake alert, follows midline and commands on right side. Moderate dysarthria, paucity of speech, knows the month but did not answer further orientation questions. PREEL, right gaze preference barely across midline. Fundi not visualized. Left lower face weakness. Tongue midline. Left hemiplegia. Purposeful antigravity movements on the right side, at least 3/5. Left plantar upgoing right downgoing.   ASSESSMENT/PLAN Matthew Meza is a 76 y.o. male with no significant past medical history presenting with left hemiparesis. He did not receive IV t-PA due to late presentation. Status post IR thrombectomy and stenting  Stroke - right MCA infarct with hemorrhagic  transformation s/p thrombectomy and right ICA stenting.  Resultant  Left hemiplegia  MRI - Acute large RIGHT MCA territory infarct with similar petechial hemorrhage.  CTA H&N - Rt ICA occlusion. Superimposed Right MCA mid M1 segment occlusion  CT head - right MCA territory hemorrhagic transformation  Ct Head repeat - Evolving RIGHT MCA territory infarct with faint residual petechial hemorrhage.  2D Echo -  Normal LV function; trace MR and TR; mildly elevated pulmonary pressure  LDL - 99  HgbA1c 6.2   VTE prophylaxis - lovenox  Diet NPO time specified  No antithrombotic prior to admission, now on plavix 75 and ASA 325mg . Continue DAPT on discharge due to carotid stenting.  Patient will be counseled to be compliant with his antithrombotic medications  Ongoing aggressive stroke risk factor management  Therapy recommendations: SNF  Disposition: Pending  ICA occlusion, right  s/p CAS  On ASA and plavix now  Continue DAPT on discharge  BP goal 130-150  Hypertension  stable Long-term BP goal normotensive  Hyperlipidemia  Home meds: No lipid lowering medications prior to admission  LDL 99, goal < 70  Start Lipitor 40 mg daily   Continue statin at discharge  Hyperglycemia  A1C 6.2  On SSI  DM coordinator consult  Add insulin Q4h for TF converage  Leukocytosis   Low grade fever  CBC 8.2->9.8->11.6->11.2->18.2  Continue monitoring  UA WBC 6-30  Urine culture pending  CXR concerning for pneumonia  On Unasyn for tx  CXR repeat - pending  Dysphagia  Did not pass swallow  s/p PEG 04/07/17  On TF  Hiccup on reglan 10mg  tid, will add baclofen - not tolerating thorazine due to drowsiness   Other Stroke Risk Factors  Advanced age  The patient quit smoking 21 years ago.  Obesity, Body mass index is 30.51 kg/m., recommend weight loss, diet and exercise as appropriate   Family hx stroke (mother)  Other Active Problems  Hypokalemia -  3.2 -> 3.7->3.7  Mild anemia - stable  Lethargic - concerning for side effect from thorazine which is d/c'ed - put on amantadine for arousal  Hospital day # 11  Marvel Plan, MD PhD Stroke Neurology 04/11/2017 4:46 PM   To contact Stroke Continuity provider, please refer to [email or web address]. After hours, contact General Neurology

## 2017-04-11 NOTE — Progress Notes (Signed)
Orthopedic Tech Progress Note Patient Details:  Matthew BoroughsRaymond J Meza 1941-03-27 [id number]  Ortho Devices Type of Ortho Device: Abdominal binder Ortho Device/Splint Location: Dropped off Abdominal binder size Large to room 5M01.  (ortho tech).   Alvina ChouWilliams, Hannah Strader C 04/11/2017, 11:42 AM

## 2017-04-11 NOTE — Anesthesia Postprocedure Evaluation (Signed)
Anesthesia Post Note  Patient: Matthew Meza  Procedure(s) Performed: Procedure(s) (LRB): ESOPHAGOGASTRODUODENOSCOPY (EGD) (N/A) PERCUTANEOUS ENDOSCOPIC GASTROSTOMY (PEG) PLACEMENT (N/A)  Patient location during evaluation: PACU Anesthesia Type: MAC Level of consciousness: awake Pain management: pain level controlled Vital Signs Assessment: post-procedure vital signs reviewed and stable Respiratory status: spontaneous breathing Cardiovascular status: stable Anesthetic complications: no       Last Vitals:  Vitals:   04/11/17 1509 04/11/17 1815  BP: 136/66 138/78  Pulse: 94 96  Resp: 20 20  Temp: 36.7 C 37 C    Last Pain:  Vitals:   04/11/17 1815  TempSrc: Axillary  PainSc:                  Comfort Iversen

## 2017-04-12 DIAGNOSIS — E669 Obesity, unspecified: Secondary | ICD-10-CM | POA: Diagnosis present

## 2017-04-12 DIAGNOSIS — I639 Cerebral infarction, unspecified: Secondary | ICD-10-CM

## 2017-04-12 DIAGNOSIS — R5383 Other fatigue: Secondary | ICD-10-CM

## 2017-04-12 DIAGNOSIS — N390 Urinary tract infection, site not specified: Secondary | ICD-10-CM | POA: Clinically undetermined

## 2017-04-12 DIAGNOSIS — E785 Hyperlipidemia, unspecified: Secondary | ICD-10-CM

## 2017-04-12 DIAGNOSIS — Z823 Family history of stroke: Secondary | ICD-10-CM

## 2017-04-12 DIAGNOSIS — N3000 Acute cystitis without hematuria: Secondary | ICD-10-CM

## 2017-04-12 DIAGNOSIS — J189 Pneumonia, unspecified organism: Secondary | ICD-10-CM | POA: Diagnosis not present

## 2017-04-12 DIAGNOSIS — I6521 Occlusion and stenosis of right carotid artery: Secondary | ICD-10-CM | POA: Diagnosis present

## 2017-04-12 DIAGNOSIS — I619 Nontraumatic intracerebral hemorrhage, unspecified: Secondary | ICD-10-CM | POA: Diagnosis not present

## 2017-04-12 HISTORY — DX: Cerebral infarction, unspecified: I63.9

## 2017-04-12 LAB — BASIC METABOLIC PANEL
ANION GAP: 9 (ref 5–15)
BUN: 18 mg/dL (ref 6–20)
CO2: 26 mmol/L (ref 22–32)
Calcium: 8.2 mg/dL — ABNORMAL LOW (ref 8.9–10.3)
Chloride: 100 mmol/L — ABNORMAL LOW (ref 101–111)
Creatinine, Ser: 1.17 mg/dL (ref 0.61–1.24)
GFR, EST NON AFRICAN AMERICAN: 59 mL/min — AB (ref >60)
Glucose, Bld: 246 mg/dL — ABNORMAL HIGH (ref 65–99)
Potassium: 3.8 mmol/L (ref 3.5–5.1)
Sodium: 135 mmol/L (ref 135–145)

## 2017-04-12 LAB — GLUCOSE, CAPILLARY
GLUCOSE-CAPILLARY: 216 mg/dL — AB (ref 65–99)
GLUCOSE-CAPILLARY: 240 mg/dL — AB (ref 65–99)
Glucose-Capillary: 265 mg/dL — ABNORMAL HIGH (ref 65–99)
Glucose-Capillary: 199 mg/dL — ABNORMAL HIGH (ref 65–99)

## 2017-04-12 LAB — CBC
HEMATOCRIT: 35.4 % — AB (ref 39.0–52.0)
HEMOGLOBIN: 11.8 g/dL — AB (ref 13.0–17.0)
MCH: 30.2 pg (ref 26.0–34.0)
MCHC: 33.3 g/dL (ref 30.0–36.0)
MCV: 90.5 fL (ref 78.0–100.0)
Platelets: 236 10*3/uL (ref 150–400)
RBC: 3.91 MIL/uL — ABNORMAL LOW (ref 4.22–5.81)
RDW: 12.4 % (ref 11.5–15.5)
WBC: 14.8 10*3/uL — AB (ref 4.0–10.5)

## 2017-04-12 LAB — TROPONIN I

## 2017-04-12 MED ORDER — AMOXICILLIN-POT CLAVULANATE 400-57 MG/5ML PO SUSR: 800.0000 mg | Freq: Two times a day (BID) | ORAL | 0 refills | Status: AC

## 2017-04-12 MED ORDER — INSULIN ASPART 100 UNIT/ML ~~LOC~~ SOLN: 0.0000 [IU] | SUBCUTANEOUS | 11 refills | Status: DC

## 2017-04-12 MED ORDER — FAMOTIDINE 20 MG PO TABS: 20.0000 mg | ORAL_TABLET | Freq: Two times a day (BID) | ORAL | Status: DC

## 2017-04-12 MED ORDER — INSULIN ASPART 100 UNIT/ML ~~LOC~~ SOLN
8.0000 [IU] | SUBCUTANEOUS | Status: DC
  Administered 2017-04-12: 8 [IU] via SUBCUTANEOUS

## 2017-04-12 MED ORDER — INSULIN ASPART 100 UNIT/ML ~~LOC~~ SOLN: 8.0000 [IU] | SUBCUTANEOUS | 11 refills | Status: DC

## 2017-04-12 MED ORDER — JEVITY 1.2 CAL PO LIQD: 1000.0000 mL | ORAL | 0 refills | Status: DC

## 2017-04-12 MED ORDER — AMOXICILLIN-POT CLAVULANATE 875-125 MG PO TABS
1.0000 | ORAL_TABLET | Freq: Two times a day (BID) | ORAL | Status: DC
  Filled 2017-04-12: qty 1

## 2017-04-12 MED ORDER — ATORVASTATIN CALCIUM 40 MG PO TABS: 40.0000 mg | ORAL_TABLET | Freq: Every day | ORAL | Status: DC

## 2017-04-12 MED ORDER — CLOPIDOGREL BISULFATE 75 MG PO TABS: 75.0000 mg | ORAL_TABLET | Freq: Every day | ORAL | Status: DC

## 2017-04-12 MED ORDER — AMANTADINE HCL 50 MG/5ML PO SYRP: 200.0000 mg | ORAL_SOLUTION | Freq: Two times a day (BID) | ORAL | 0 refills | Status: AC

## 2017-04-12 MED ORDER — AMOXICILLIN-POT CLAVULANATE 400-57 MG/5ML PO SUSR
800.0000 mg | Freq: Two times a day (BID) | ORAL | Status: DC
  Administered 2017-04-12: 800 mg
  Filled 2017-04-12 (×2): qty 10

## 2017-04-12 MED ORDER — ASPIRIN 325 MG PO TABS: 325.0000 mg | ORAL_TABLET | Freq: Every day | ORAL | Status: DC

## 2017-04-12 MED ORDER — BACLOFEN 1 MG/ML ORAL SUSPENSION: 10.0000 mg | Freq: Three times a day (TID) | ORAL | Status: DC

## 2017-04-12 MED ORDER — IPRATROPIUM-ALBUTEROL 0.5-2.5 (3) MG/3ML IN SOLN: 3.0000 mL | RESPIRATORY_TRACT | Status: DC | PRN

## 2017-04-12 MED ORDER — IPRATROPIUM-ALBUTEROL 0.5-2.5 (3) MG/3ML IN SOLN: 3.0000 mL | Freq: Three times a day (TID) | RESPIRATORY_TRACT | Status: DC

## 2017-04-12 NOTE — Care Management Note (Signed)
Case Management Note  Patient Details  Name: Matthew Meza MRN: [id number] Date of Birth: 06-25-1941  Subjective/Objective:                    Action/Plan: [facility] No further needs per CM.  Expected Discharge Date:  04/12/17               Expected Discharge Plan:  Skilled Nursing Facility  In-House Referral:  Clinical Social Work  Discharge planning Services     Post Acute Care Choice:    Choice offered to:     DME Arranged:    DME Agency:     HH Arranged:    HH Agency:     Status of Service:  Completed, signed off  If discussed at [employer] Meetings, dates discussed:    Additional Comments:  Matthew BaloKelli F Genie Wenke, RN 04/12/2017, 2:43 PM

## 2017-04-12 NOTE — Progress Notes (Signed)
Inpatient Diabetes Program Recommendations  AACE/ADA: New Consensus Statement on Inpatient Glycemic Control (2015)  Target Ranges:  Prepandial:   less than 140 mg/dL      Peak postprandial:   less than 180 mg/dL (1-2 hours)      Critically ill patients:  140 - 180 mg/dL   Lab Results  Component Value Date   GLUCAP 240 (H) 04/12/2017   HGBA1C 6.2 (H) 04/01/2017    Review of Glycemic Control Inpatient Diabetes Program Recommendations:  Noted continued elevated CBGs. Please consider increase in TF coverage to 8 units tid and hold if stopped or held.  Thank you, Billy FischerJudy E. Ayoub Arey, RN, MSN, CDE  Diabetes Coordinator Inpatient Glycemic Control Team Team Pager [phone] (8am-5pm) 04/12/2017 11:25 AM

## 2017-04-12 NOTE — Clinical Social Work Placement (Signed)
   CLINICAL SOCIAL WORK PLACEMENT  NOTE  Date:  04/12/2017  Patient Details  Name: Matthew BoroughsRaymond J Tennell MRN: [id number] Date of Birth: [birth year]  Clinical Social Work is seeking post-discharge placement for this patient at the Skilled  Nursing Facility level of care (*CSW will initial, date and re-position this form in  chart as items are completed):      Patient/family provided with [facility] Clinical [facility] Department's list of facilities offering this level of care within the geographic area requested by the patient (or if unable, by the patient's family).  Yes   Patient/family informed of their freedom to choose among providers that offer the needed level of care, that participate in Medicare, Medicaid or managed care program needed by the patient, have an available bed and are willing to accept the patient.      Patient/family informed of [facility] ownership interest in [facility] and [facility], as well as of the fact that they are under no obligation to receive care at these facilities.  PASRR submitted to EDS on       PASRR number received on 04/04/17     Existing PASRR number confirmed on       FL2 transmitted to all facilities in geographic area requested by pt/family on 04/04/17     FL2 transmitted to all facilities within larger geographic area on       Patient informed that his/her managed care company has contracts with or will negotiate with certain facilities, including the following:        Yes   Patient/family informed of bed offers received.  [facility]     Physician recommends and patient chooses bed at      [facility] to [facility] 04/12/17.  Patient to be transferred to facility by [facility]     Patient family notified on 04/12/17 of transfer.  Name of family member notified:  Pt daughter, at bedside     PHYSICIAN       Additional Comment:     _______________________________________________ Baldemar LenisElizabeth M Fahmida Jurich, LCSW 04/12/2017, 4:22 PM

## 2017-04-12 NOTE — Progress Notes (Signed)
Discharge to: [facility] Anticipated discharge date: 04/12/17 Family notified: Yes, daughter, at bedside Transportation by: [facility]  CSW signing off.  Blenda Nicelylizabeth Vonda Harth LCSW [phone]

## 2017-04-12 NOTE — Progress Notes (Signed)
PEG flushed for transport.

## 2017-04-12 NOTE — Progress Notes (Signed)
Physical Therapy Treatment Patient Details Name: Matthew Meza MRN: [id number] DOB: 06/08/1941 Today's Date: 04/12/2017    History of Present Illness 76 yo admitted with Rt MCA CVA  emergent stent system of the right ICA for treatment of dissection and intraluminal thrombus, and mechanical thrombectomy of the right MCA and carotid siphon, VDRF 4/27-4/29. No significant PMHx. Coretrac placed 5/3.     PT Comments    Pt presented supine in bed with HOB elevated, awake and willing to participate in therapy session. Pt seen for mobility progression with focus of session on sitting balance and posture. Pt with improved alertness and able to better participate this session; however, he continue to require multimodal cueing to maintain attention to task. Pt would continue to benefit from skilled physical therapy services at this time while admitted and after d/c to address the below listed limitations in order to improve overall safety and independence with functional mobility.    Follow Up Recommendations  SNF;Supervision/Assistance - 24 hour     Equipment Recommendations  Other (comment) (defer next venue)    Recommendations for Other Services       Precautions / Restrictions Precautions Precautions: Fall Precaution Comments: left hemiplegia, neglect Restrictions Weight Bearing Restrictions: No    Mobility  Bed Mobility Overal bed mobility: Needs Assistance Bed Mobility: Rolling;Sidelying to Sit;Sit to Sidelying Rolling: Mod assist Sidelying to sit: Max assist;+2 for physical assistance     Sit to sidelying: Max assist;+2 for physical assistance General bed mobility comments: pt able to use R UE on bed rail and R LE to assist with moving L LE towards EOB, bed pads use to help position pt's hips at EOB  Transfers                    Ambulation/Gait                 Stairs            Wheelchair Mobility    Modified Rankin (Stroke Patients Only)        Balance Overall balance assessment: Needs assistance Sitting-balance support: Single extremity supported;Feet supported Sitting balance-Leahy Scale: Poor Sitting balance - Comments: pt fluctuating with requiring mod A to close min guard, requiring multimodal cueing to work on achieving midline. pt with preference to push towards his L. Pt participated in sustained midline sitting posture activity with lateral weight shifting onto R forearm and returning back to midline. Postural control: Left lateral lean (pushing L)                                  Cognition Arousal/Alertness: Awake/alert Behavior During Therapy: Flat affect Overall Cognitive Status: Impaired/Different from baseline Area of Impairment: Attention;Memory;Safety/judgement;Awareness;Problem solving;Following commands;Orientation                 Orientation Level: Disoriented to;Time Current Attention Level: Sustained Memory: Decreased short-term memory Following Commands: Follows one step commands consistently Safety/Judgement: Decreased awareness of safety;Decreased awareness of deficits Awareness: Emergent Problem Solving: Slow processing;Decreased initiation;Difficulty sequencing;Requires verbal cues;Requires tactile cues General Comments: pt with improved alertness this session and better able to participate than previous session; however, continues to have difficulty maintaining attention to task and requires multimodal cueing      Exercises Other Exercises Other Exercises: worked on static sitting balance EOB, lateral weight shifting and sustained midline    General Comments        Pertinent  Vitals/Pain Pain Assessment: No/denies pain    Home Living                      Prior Function            PT Goals (current goals can now be found in the care plan section) Acute Rehab PT Goals PT Goal Formulation: With patient Time For Goal Achievement: 04/17/17 Potential to  Achieve Goals: Fair Progress towards PT goals: Progressing toward goals    Frequency    Min 4X/week      PT Plan Current plan remains appropriate    Co-evaluation              AM-PAC PT "6 Clicks" Daily Activity  Outcome Measure  Difficulty turning over in bed (including adjusting bedclothes, sheets and blankets)?: Total Difficulty moving from lying on back to sitting on the side of the bed? : Total Difficulty sitting down on and standing up from a chair with arms (e.g., wheelchair, bedside commode, etc,.)?: Total Help needed moving to and from a bed to chair (including a wheelchair)?: Total Help needed walking in hospital room?: Total Help needed climbing 3-5 steps with a railing? : Total 6 Click Score: 6    End of Session Equipment Utilized During Treatment: Oxygen Activity Tolerance: Patient limited by fatigue Patient left: in bed;with call bell/phone within reach;with bed alarm set Nurse Communication: Mobility status;Need for lift equipment PT Visit Diagnosis: Other abnormalities of gait and mobility (R26.89);Unsteadiness on feet (R26.81);Hemiplegia and hemiparesis Hemiplegia - Right/Left: Left Hemiplegia - dominant/non-dominant: Non-dominant Hemiplegia - caused by: Cerebral infarction     Time: [id number] PT Time Calculation (min) (ACUTE ONLY): 20 min  Charges:  $Therapeutic Activity: 8-22 mins                    G Codes:       [place]ennifer [place] Meza, [place], DPT [phone]    Matthew BevelsJennifer Meza Matthew Meza 04/12/2017, 11:07 AM

## 2017-04-12 NOTE — Progress Notes (Signed)
Occupational Therapy Treatment Patient Details Name: Matthew BoroughsRaymond J Meza MRN: [id number] DOB: October 27, 1941 Today's Date: 04/12/2017    History of present illness 76 yo admitted with Rt MCA CVA  emergent stent system of the right ICA for treatment of dissection and intraluminal thrombus, and mechanical thrombectomy of the right MCA and carotid siphon, VDRF 4/27-4/29. No significant PMHx. Coretrac placed 5/3.    OT comments  Pt progressing towards goals. LUE remains flaccid with hand at Brunstrom level I. Utilized NDT techniques to facilitate rolling to side lying on R side in preparation for sitting EOB to complete seated ADLs. Ended session with Pt in sidelying to L side to facilitate L side weight bearing. Pt able to achieve rolling to side lying with Mod-MaxA. Pt demonstrates ability to attend to L side with multimodal cues during session. Discharge plan remains appropriate. OT will continue to follow.    Follow Up Recommendations  SNF;Supervision/Assistance - 24 hour    Equipment Recommendations   (TBD at next venue )    Recommendations for Other Services      Precautions / Restrictions Precautions Precautions: Fall Precaution Comments: left hemiplegia, neglect Restrictions Weight Bearing Restrictions: No       Mobility Bed Mobility Overal bed mobility: Needs Assistance Bed Mobility: Rolling Rolling: Mod assist;Max assist Sidelying to sit: Max assist;+2 for physical assistance     Sit to sidelying: Max assist;+2 for physical assistance General bed mobility comments: Pt completed rolling to R and L side with increased assist for rolling to R side; Pt demonstrates ability to use RUE on bed rail to self assist, RLE to assist moving LLE towards EOB; use of bed pads to move hips and use of pillows to maintain sidelying   Transfers                      Balance Overall balance assessment: Needs assistance Sitting-balance support: Single extremity supported;Feet  supported Sitting balance-Leahy Scale: Poor Sitting balance - Comments: pt fluctuating with requiring mod A to close min guard, requiring multimodal cueing to work on achieving midline. pt with preference to push towards his L. Pt participated in sustained midline sitting posture activity with lateral weight shifting onto R forearm and returning back to midline. Postural control: Left lateral lean (pushing L)                                 ADL either performed or assessed with clinical judgement   ADL Overall ADL's : Needs assistance/impaired Eating/Feeding: NPO   Grooming: Bed level;Minimal assistance               Lower Body Dressing: Total assistance                 General ADL Comments: Utilized NDT techniques and multimodal cues to facilitate side rolling in preparation for sitting EOB to complete seated ADLs; Pt able to achieve rolling to sidelying with Mod-MaxA and multimodal cues                       Cognition Arousal/Alertness: Awake/alert Behavior During Therapy: Flat affect Overall Cognitive Status: Impaired/Different from baseline Area of Impairment: Attention;Memory;Awareness;Following commands                 Orientation Level: Disoriented to;Time Current Attention Level: Sustained Memory: Decreased short-term memory Following Commands: Follows one step commands consistently Safety/Judgement: Decreased awareness of safety;Decreased awareness  of deficits Awareness: Emergent Problem Solving: Slow processing;Decreased initiation;Difficulty sequencing;Requires verbal cues;Requires tactile cues General Comments: Pt initially lethargic at start of session with increased alertness and increased participation throughout, requires multimodal cues to maintain attention and follow commands                  General Comments      Pertinent Vitals/ Pain       Pain Assessment: Faces Faces Pain Scale: No hurt  Home Living                                           Prior Functioning/Environment              Frequency  Min 2X/week        Progress Toward Goals  OT Goals(current goals can now be found in the care plan section)  Progress towards OT goals: Progressing toward goals  Acute Rehab OT Goals Patient Stated Goal: eat donuts and coffee OT Goal Formulation: With patient Time For Goal Achievement: 04/17/17 Potential to Achieve Goals: Good  Plan Discharge plan remains appropriate;Frequency remains appropriate                     AM-PAC PT "6 Clicks" Daily Activity     Outcome Measure   Help from another person eating meals?: Total Help from another person taking care of personal grooming?: A Lot Help from another person toileting, which includes using toliet, bedpan, or urinal?: Total Help from another person bathing (including washing, rinsing, drying)?: A Lot Help from another person to put on and taking off regular upper body clothing?: A Lot Help from another person to put on and taking off regular lower body clothing?: Total 6 Click Score: 9    End of Session Equipment Utilized During Treatment: Oxygen  OT Visit Diagnosis: Other abnormalities of gait and mobility (R26.89);Muscle weakness (generalized) (M62.81);Low vision, both eyes (H54.2);Other symptoms and signs involving cognitive function;Cognitive communication deficit (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

## 2017-04-12 NOTE — Progress Notes (Signed)
Report attempted x3 to [facility]

## 2017-04-12 NOTE — Progress Notes (Signed)
  Speech Language Pathology Treatment: Dysphagia;Cognitive-Linquistic  Patient Details Name: Matthew BoroughsRaymond J Barabas MRN: [id number] DOB: 21-Dec-1940 Today's Date: 04/12/2017 Time: [id number] SLP Time Calculation (min) (ACUTE ONLY): 12 min  Assessment / Plan / Recommendation Clinical Impression  Pt is more alert today but still needs Max cues for completion of complex instructions to attempt pharyngeal strengthening exercises. He did demonstrate brief sustained attention with Min cues to perform his own oral care. Ice chips were administered today without overt coughing. Will continue to follow.   HPI HPI: Mr.Chandan J Bramlettis a 76 y.o.malewith no significant past medical historypresenting with left hemiparesis.Hedidnot receive IV t-PA due to late presentation. Dx with hemorrhagic right MCA infarct. Status post IR thrombectomy and stenting 4/27, remained intubated post procedure. Extubated 4/29.      SLP Plan  Continue with current plan of care       Recommendations  Diet recommendations: NPO Medication Administration: Via alternative means                Oral Care Recommendations: Oral care QID Follow up Recommendations: Skilled Nursing facility SLP Visit Diagnosis: Dysphagia, unspecified (R13.10);Cognitive communication deficit (A21.308(R41.841) Plan: Continue with current plan of care       GO                Maxcine Hamaiewonsky, Simranjit Thayer 04/12/2017, 12:22 PM  Maxcine HamLaura Paiewonsky, M.A. CCC-SLP [phone]

## 2017-04-12 NOTE — Progress Notes (Signed)
Belongings from safe returned to patient's daughter. She states that is all his belongings. Patient and daughter aware of transport.  Neuro assessment unchanged. IV and tele removed. Will continue to monitor

## 2017-04-13 LAB — URINE CULTURE

## 2017-04-14 ENCOUNTER — Non-Acute Institutional Stay (SKILLED_NURSING_FACILITY): Payer: Medicare HMO | Admitting: Internal Medicine

## 2017-04-14 ENCOUNTER — Encounter: Payer: Self-pay | Admitting: Internal Medicine

## 2017-04-14 DIAGNOSIS — I639 Cerebral infarction, unspecified: Secondary | ICD-10-CM

## 2017-04-14 DIAGNOSIS — I6521 Occlusion and stenosis of right carotid artery: Secondary | ICD-10-CM | POA: Diagnosis not present

## 2017-04-14 DIAGNOSIS — R531 Weakness: Secondary | ICD-10-CM

## 2017-04-14 DIAGNOSIS — R7303 Prediabetes: Secondary | ICD-10-CM

## 2017-04-14 DIAGNOSIS — I69354 Hemiplegia and hemiparesis following cerebral infarction affecting left non-dominant side: Secondary | ICD-10-CM | POA: Diagnosis not present

## 2017-04-14 DIAGNOSIS — K219 Gastro-esophageal reflux disease without esophagitis: Secondary | ICD-10-CM | POA: Diagnosis not present

## 2017-04-14 DIAGNOSIS — N390 Urinary tract infection, site not specified: Secondary | ICD-10-CM

## 2017-04-14 DIAGNOSIS — I69391 Dysphagia following cerebral infarction: Secondary | ICD-10-CM | POA: Diagnosis not present

## 2017-04-14 DIAGNOSIS — D72829 Elevated white blood cell count, unspecified: Secondary | ICD-10-CM | POA: Diagnosis not present

## 2017-04-14 DIAGNOSIS — B964 Proteus (mirabilis) (morganii) as the cause of diseases classified elsewhere: Secondary | ICD-10-CM

## 2017-04-14 DIAGNOSIS — I10 Essential (primary) hypertension: Secondary | ICD-10-CM

## 2017-04-14 DIAGNOSIS — N182 Chronic kidney disease, stage 2 (mild): Secondary | ICD-10-CM | POA: Diagnosis not present

## 2017-04-14 DIAGNOSIS — E785 Hyperlipidemia, unspecified: Secondary | ICD-10-CM | POA: Diagnosis not present

## 2017-04-14 NOTE — Progress Notes (Signed)
LOCATION: [facility]  PCP: Patient, No Pcp Per   Code Status: Full Code  Goals of care: Advanced Directive information Advanced Directives 03/31/2017  Does Patient Have a Medical Advance Directive? No       Extended Emergency Contact Information Primary Emergency Contact: Gaylyn Rong [place] Home Phone: [phone] Mobile Phone: [phone] Relation: Sister Secondary Emergency Contact: Clement Sayres [place] Mobile Phone: [phone] Relation: None   Allergies  Allergen Reactions  . No Known Allergies     Chief Complaint  Patient presents with  . New Admit To SNF    New Admission Visit      HPI:  Patient is a 76 y.o. 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 He underwent right ICA stent placement. He had post stroke left flaccid hemiplegia, dysarthria and dysphagia. He underwent IR guided PEG tube placement and is now on tube feed. He is seen in his room today. He has no known significant PMH.   Review of Systems:  Constitutional: Negative for fever.  HENT: Negative for headache, congestion, nasal discharge.   Eyes: Negative for double vision and discharge.  Respiratory: Negative for cough, shortness of breath and wheezing. Positive for having hiccups. Cardiovascular: Negative for chest pain.  Gastrointestinal: Negative for heartburn, nausea, vomiting, abdominal pain. Last bowel movement was yesterday. Genitourinary: Negative for dysuria.  Musculoskeletal: Negative for back pain, fall in the facility.  Skin: Negative for itching, rash.  Neurological: Negative for dizziness.   Past Medical History:  Diagnosis Date  . No pertinent past medical history    Past Surgical History:  Procedure Laterality Date  . APPENDECTOMY    . ESOPHAGOGASTRODUODENOSCOPY N/A 04/07/2017   Procedure:  ESOPHAGOGASTRODUODENOSCOPY (EGD);  Surgeon: Violeta Gelinas, MD;  Location: [facility] ENDOSCOPY;  Service: Endoscopy;  Laterality: N/A;  . IR ANGIO INTRA EXTRACRAN SEL COM CAROTID INNOMINATE UNI L MOD SED  03/31/2017  . IR INTRAVSC STENT CERV CAROTID W/O EMB-PROT MOD SED INC ANGIO  03/31/2017  . IR PERCUTANEOUS ART THROMBECTOMY/INFUSION INTRACRANIAL INC DIAG ANGIO  03/31/2017  . IR US GUIDE VASC ACCESS RIGHT  03/31/2017  . LAPAROSCOPIC APPENDECTOMY  06/21/2012   Procedure: APPENDECTOMY LAPAROSCOPIC;  Surgeon: Kandis Cocking, MD;  Location: [facility] ORS;  Service: General;  Laterality: N/A;  . NO PAST SURGERIES    . PEG PLACEMENT N/A 04/07/2017   Procedure: PERCUTANEOUS ENDOSCOPIC GASTROSTOMY (PEG) PLACEMENT;  Surgeon: Violeta Gelinas, MD;  Location: [facility] ENDOSCOPY;  Service: Endoscopy;  Laterality: N/A;  . RADIOLOGY WITH ANESTHESIA N/A 03/31/2017   Procedure: RADIOLOGY WITH ANESTHESIA;  Surgeon: Gilmer Mor, DO;  Location: [facility] OR;  Service: Anesthesiology;  Laterality: N/A;   Social History:   reports that he quit smoking about 21 years ago. He quit after 20.00 years of use. He has never used smokeless tobacco. He reports that he does not drink alcohol or use drugs.  Family History  Problem Relation Age of Onset  . Stroke Mother     Medications: Allergies as of 04/14/2017      Reactions   No Known Allergies       Medication List       Accurate as of 04/14/17  8:25 AM. Always use your most recent med list.          amantadine 50 MG/5ML solution Commonly known as:  SYMMETREL Place 20 mLs (200 mg total) into feeding tube 2 (two)  times daily.   amoxicillin-clavulanate 400-57 MG/5ML suspension Commonly known as:  AUGMENTIN Place 10 mLs (800 mg total) into feeding tube every 12 (twelve) hours.   aspirin 325 MG tablet Place 1 tablet (325 mg total) into feeding tube daily.   atorvastatin 40 MG tablet Commonly known as:  LIPITOR Place 1 tablet (40 mg total) into feeding tube daily at 6 PM.   baclofen  10 mg/mL Susp Commonly known as:  LIORESAL Take 1 mL (10 mg total) by mouth 3 (three) times daily.   clopidogrel 75 MG tablet Commonly known as:  PLAVIX Place 1 tablet (75 mg total) into feeding tube daily.   famotidine 20 MG tablet Commonly known as:  PEPCID Place 1 tablet (20 mg total) into feeding tube 2 (two) times daily.   feeding supplement (JEVITY 1.2 CAL) Liqd Place 1,000 mLs into feeding tube continuous.   insulin aspart 100 UNIT/ML injection Commonly known as:  novoLOG Inject 0-15 Units into the skin every 4 (four) hours.   insulin aspart 100 UNIT/ML injection Commonly known as:  novoLOG Inject 8 Units into the skin every 4 (four) hours.   ipratropium-albuterol 0.5-2.5 (3) MG/3ML Soln Commonly known as:  DUONEB Take 3 mLs by nebulization every 4 (four) hours as needed.   ipratropium-albuterol 0.5-2.5 (3) MG/3ML Soln Commonly known as:  DUONEB Take 3 mLs by nebulization 3 (three) times daily.       Immunizations: Immunization History  Administered Date(s) Administered  . Influenza,inj,Quad PF,36+ Mos 11/20/2013     Physical Exam: Vitals:   04/14/17 0821  BP: 124/68  Pulse: 72  Resp: 18  Temp: 97.6 F (36.4 C)  TempSrc: Oral  SpO2: 96%  Weight: 203 lb (92.1 kg)  Height: 5\' 8"  (1.727 m)   Body mass index is 30.87 kg/m.  General- elderly male, well built, in no acute distress Head- normocephalic, atraumatic Nose- no nasal discharge Throat- moist mucus membrane Eyes- PERRLA, EOMI, no pallor, no icterus, no discharge, normal conjunctiva, normal sclera Neck- no cervical lymphadenopathy Cardiovascular- normal s1,s2, no murmur Respiratory- bilateral clear to auscultation, no wheeze, no rhonchi, no crackles, no use of accessory muscles, on 2 liter oxygen by nasal canula Abdomen- bowel sounds present, soft, non tender, no guarding or rigidity, peg tube in lace, site clean Musculoskeletal- left sided flaccid hemiplegia, can move his right UE and LE.  Hoyer lift transfer and 2 person maximum assist with transfers.  Neurological- alert and oriented to person, place and time Skin- warm and dry, floater to left heel, easy bruising Psychiatry- flat affect    Labs reviewed: Basic Metabolic Panel:  Recent Labs  40/98/11 0710 04/04/17 0538 04/05/17 0356  04/09/17 0349 04/10/17 0325 04/11/17 1238 04/12/17 0417  NA  --   --   --   < > 139 136 134* 135  K  --   --   --   < > 3.7 3.7 4.0 3.8  CL  --   --   --   < > 104 102 102 100*  CO2  --   --   --   < > 24 25 24 26   GLUCOSE  --   --   --   < > 173* 239* 323* 246*  BUN  --   --   --   < > 12 13 16 18   CREATININE  --   --   --   < > 0.95 1.02 1.16 1.17  CALCIUM  --   --   --   < >  8.1* 8.2* 8.2* 8.2*  MG 1.8 2.0 1.9  --   --   --   --   --   PHOS 2.4* 1.8* 2.3*  --  2.2*  --   --   --   < > = values in this interval not displayed. Liver Function Tests:  Recent Labs  08/10/16 1324 03/31/17 1040  AST 27 31  ALT 42 29  ALKPHOS 71 61  BILITOT 0.9 0.9  PROT 7.8 6.8  ALBUMIN 4.6 4.0   No results for input(s): LIPASE, AMYLASE in the last 8760 hours. No results for input(s): AMMONIA in the last 8760 hours. CBC:  Recent Labs  04/04/17 0538 04/05/17 0356  04/10/17 0325 04/11/17 1238 04/12/17 0417  WBC 9.1 8.2  < > 11.2* 18.2* 14.8*  NEUTROABS 7.1 5.9  --   --  15.9*  --   HGB 12.2* 12.2*  < > 12.4* 12.1* 11.8*  HCT 35.8* 34.7*  < > 36.5* 35.4* 35.4*  MCV 90.2 90.4  < > 88.8 89.8 90.5  PLT 114* 142*  < > 187 205 236  < > = values in this interval not displayed. Cardiac Enzymes:  Recent Labs  04/12/17 0417  TROPONINI <0.03   BNP: Invalid input(s): POCBNP CBG:  Recent Labs  04/12/17 0757 04/12/17 1106 04/12/17 1627  GLUCAP 265* 240* 199*    Radiological Exams: Ct Angio Head W Or Wo Contrast  Result Date: 03/31/2017 CLINICAL DATA:  76 year old male with left side weakness and evidence of right MCA emergent large vessel occlusion on noncontrast head CT.  EXAM: CT ANGIOGRAPHY HEAD AND NECK CT PERFUSION BRAIN TECHNIQUE: Multidetector CT imaging of the head and neck was performed using the standard protocol during bolus administration of intravenous contrast. Multiplanar CT image reconstructions and MIPs were obtained to evaluate the vascular anatomy. Carotid stenosis measurements (when applicable) are obtained utilizing NASCET criteria, using the distal internal carotid diameter as the denominator. Multiphase CT imaging of the brain was performed following IV bolus contrast injection. Subsequent parametric perfusion maps were calculated using RAPID software. CONTRAST:  90 mL Isovue 370 COMPARISON:  Noncontrast head CT 1048 hours today. FINDINGS: CT Brain Perfusion Findings: CBF (<30%) Volume: 40 mL (using CBF less than 30%). Perfusion (Tmax>6.0s) volume: 185 mL (T-max greater than 6 seconds). Mismatch Volume: 146 mL (mismatch  ratio 4.6). Infarction Location:Right MCA Perfusion findings were reviewed in person with Dr. Ritta Slot at 1059 hours. CTA NECK Skeleton: No acute osseous abnormality identified. Age concordant degenerative changes in the cervical spine. Intermittent carious dentition. Upper chest: Negative; no superior mediastinal lymphadenopathy. There is calcified coronary artery atherosclerosis. Other neck: Negative thyroid, larynx, pharynx, parapharyngeal spaces, retropharyngeal space, sublingual space, submandibular glands and parotid glands. No cervical lymphadenopathy. Aortic arch: Moderate soft and atherosclerotic arch atherosclerosis. Three vessel arch configuration. Right carotid system: No brachiocephalic artery or right CCA origin stenosis. The right carotid bifurcation is patent, but the right ICA origin is occluded abruptly 9 mm beyond its origin (series 10, image 39). The right ICA remains occluded to the skull base. Left carotid system: No left CCA origin stenosis. Medial soft plaque throughout the left CCA at the level of the thyroid,  not hemodynamically significant. Soft and calcified plaque at the left ICA origin and bulb, not hemodynamically significant. Mid and distal cervical left ICA air negative. Vertebral arteries:No proximal right subclavian artery stenosis. Normal right vertebral artery origin. Negative cervical right vertebral artery. No proximal left subclavian artery stenosis despite soft plaque. Soft  plaque at the left vertebral artery origin and V1 segment with only mild stenosis. Otherwise negative cervical left vertebral artery. CTA HEAD Posterior circulation: Codominant distal vertebral arteries without stenosis. Patent PICA origins. Patent vertebrobasilar junction. No basilar stenosis. Normal SCA and PCA origins. Posterior communicating arteries are diminutive or absent. Mild bilateral P1 and P2 segment irregularity, no PCA stenosis identified. Anterior circulation: Left ICA siphon is patent with mild calcified plaque and no stenosis. Left ICA terminus, left MCA and left ACA origins are normal. Right ICA siphon is occluded through the cavernous segment. Beginning at the anterior genu there is reconstitution of the right ICA siphon, and the right ICA terminus is patent. The right MCA origin is patent but the mid M1 segment is occluded about 11 mm from the origin. There is minimal reconstituted enhancement in the proximal M2 segments. The right ACA origin is patent but diminutive and appears somewhat stenotic (series 8, image 89). The left A1 segment may be dominant. A patent anterior communicating artery is identified. The bilateral ACA A2 segments and distal ACA branches appear symmetric and within normal limits. Venous sinuses: Patent. Anatomic variants: Possibly dominant left ACA A1 segment. Review of the MIP images confirms the above findings IMPRESSION: 1. Positive for emergent large vessel occlusion, and favorable CT perfusion findings for endovascular re-perfusion. 2. Occlusion of the Right ICA just beyond its origin.  Reconstitution of the Right ICA terminus beginning at the anterior genu (perhaps via the right ophthalmic artery). But superimposed Right MCA mid M1 segment occlusion. 3. CT perfusion indicates a core infarct of 40 mL in the Right MCA territory and right hemisphere penumbra of 146 mL. 4. The above was relayed to Dr. Ritta Slot beginning at 1059 hours. 5. Stenosis of the right ACA origin, but the right A1 segment may be non dominant. Right A2 and distal ACA enhancement is within normal limits. Normal left MCA and left ACA. 6. Aortic arch and bilateral carotid atherosclerosis without additional stenosis. 7. Mild left vertebral artery origin stenosis due to soft plaque. Otherwise negative posterior circulation. 8. Calcified coronary artery atherosclerosis. Electronically Signed   By: Odessa Fleming M.D.   On: 03/31/2017 11:22   Ct Head Wo Contrast  Result Date: 04/09/2017 CLINICAL DATA:  Follow-up RIGHT MCA territory infarct. EXAM: CT HEAD WITHOUT CONTRAST TECHNIQUE: Contiguous axial images were obtained from the base of the skull through the vertex without intravenous contrast. COMPARISON:  MRI of the head April 01, 2017 FINDINGS: BRAIN: No intraparenchymal hemorrhage, mass effect nor midline shift. Patchy to confluent RIGHT frontotemporal parietal lobes and RIGHT basal ganglia encephalomalacia similar in distribution to prior MRI. Faint residual petechial hemorrhage within RIGHT basal ganglia and RIGHT frontal lobe. Mild mass effect RIGHT lateral ventricle, ventricles and sulci at the age normal for patient's age. No significant midline shift. No acute large vascular territory infarct. No abnormal extra-axial fluid collections. VASCULAR: Mild calcific atherosclerosis of the carotid siphons. SKULL: No skull fracture. No significant scalp soft tissue swelling. SINUSES/ORBITS: Moderate paranasal sinus mucosal thickening. Mastoid air cells are well aerated. The included ocular globes and orbital contents are  non-suspicious. OTHER: None. IMPRESSION: Evolving RIGHT MCA territory infarct with faint residual petechial hemorrhage. Electronically Signed   By: Awilda Metro M.D.   On: 04/09/2017 00:46   Ct Head Wo Contrast  Result Date: 04/01/2017 CLINICAL DATA:  Continued surveillance RIGHT ICA and MCA occlusion. LEFT-sided weakness. EXAM: CT HEAD WITHOUT CONTRAST TECHNIQUE: Contiguous axial images were obtained from the base of the skull  through the vertex without intravenous contrast. COMPARISON:  Multiple priors. Most recent CT head 03/31/2017. CTA head neck and CT perfusion 03/31/2017. FINDINGS: Brain: There is developing bland and hemorrhagic infarction in the RIGHT hemisphere, corresponding roughly to the area of infarcted core of brain on CT perfusion, CBF < 30%. Along the inferior RIGHT frontal lobe and insula, images 13-15, developing hypoattenuation represents cytotoxic edema. More superiorly, in the RIGHT lentiform nucleus extending toward the centrum semiovale, images 16-19, cloud-like hyperattenuation, in the setting of washout of previous iodinated contrast/blood pool, is consistent with petechial hemorrhage within infarcted tissue. No subarachnoid blood. Mild mass effect on the RIGHT frontal horn, but insignificant RIGHT-to-LEFT shift. No extra-axial collection. Vascular: No hyperdense vessel or unexpected calcification. Skull: Normal. Negative for fracture or focal lesion. Sinuses/Orbits: No acute finding. Other: None. IMPRESSION: Developing bland and hemorrhagic infarction in the RIGHT hemisphere corresponding roughly to the area of infarcted core of brain on recent CT perfusion, see discussion above. Electronically Signed   By: Elsie Stain M.D.   On: 04/01/2017 15:18   Ct Head Wo Contrast  Result Date: 03/31/2017 CLINICAL DATA:  Status post revascularization of the occluded right internal carotid artery and right middle cerebral artery. Left-sided weakness. EXAM: CT HEAD WITHOUT CONTRAST  TECHNIQUE: Contiguous axial images were obtained from the base of the skull through the vertex without intravenous contrast. COMPARISON:  CT head without contrast from the same day. FINDINGS: Brain: Hyperdensity is present within the right lentiform nucleus along the area of core infarct. There is also hyperdensity along the body of the body. Hyperdense cortex is present in the right frontal operculum. There is focal density along the posterior watershed territory and over the right frontal convexity. The left basal ganglia are intact. No acute or focal left-sided infarct is present. There is some swelling of the sulci on the right. A small amount of subarachnoid blood may present on the right. The ventricles are of normal size. There is no intraventricular hemorrhage. No extra-axial hemorrhage is present on the left. The brainstem and cerebellum are normal. Vascular: Atherosclerotic calcifications are present in the cavernous internal carotid arteries. Contrast is present within the major intracranial arteries and dural sinuses. Skull: The calvarium is intact. Sinuses/Orbits: Extensive sinus disease is again noted. Fluid is present in the nasopharynx, likely associated with intubation. This has increased. IMPRESSION: 1. Extensive areas of hyperdensity involving the basal ganglia and right MCA territory cortex likely reflecting luxury perfusion associated with the areas of acute infarction following revascularization. Hemorrhage is not entirely excluded, but felt to be a minority of the hyperdensity visualized. The areas largely correspond to the areas of core infarction on the CTP study. 2. A tiny amount of subarachnoid hemorrhage may be present without intraventricular hemorrhage. Electronically Signed   By: Marin Roberts M.D.   On: 03/31/2017 16:02   Ct Angio Neck W Or Wo Contrast  Result Date: 03/31/2017 CLINICAL DATA:  76 year old male with left side weakness and evidence of right MCA emergent large  vessel occlusion on noncontrast head CT. EXAM: CT ANGIOGRAPHY HEAD AND NECK CT PERFUSION BRAIN TECHNIQUE: Multidetector CT imaging of the head and neck was performed using the standard protocol during bolus administration of intravenous contrast. Multiplanar CT image reconstructions and MIPs were obtained to evaluate the vascular anatomy. Carotid stenosis measurements (when applicable) are obtained utilizing NASCET criteria, using the distal internal carotid diameter as the denominator. Multiphase CT imaging of the brain was performed following IV bolus contrast injection. Subsequent parametric perfusion maps  were calculated using RAPID software. CONTRAST:  90 mL Isovue 370 COMPARISON:  Noncontrast head CT 1048 hours today. FINDINGS: CT Brain Perfusion Findings: CBF (<30%) Volume: 40 mL (using CBF less than 30%). Perfusion (Tmax>6.0s) volume: 185 mL (T-max greater than 6 seconds). Mismatch Volume: 146 mL (mismatch  ratio 4.6). Infarction Location:Right MCA Perfusion findings were reviewed in person with Dr. Ritta Slot at 1059 hours. CTA NECK Skeleton: No acute osseous abnormality identified. Age concordant degenerative changes in the cervical spine. Intermittent carious dentition. Upper chest: Negative; no superior mediastinal lymphadenopathy. There is calcified coronary artery atherosclerosis. Other neck: Negative thyroid, larynx, pharynx, parapharyngeal spaces, retropharyngeal space, sublingual space, submandibular glands and parotid glands. No cervical lymphadenopathy. Aortic arch: Moderate soft and atherosclerotic arch atherosclerosis. Three vessel arch configuration. Right carotid system: No brachiocephalic artery or right CCA origin stenosis. The right carotid bifurcation is patent, but the right ICA origin is occluded abruptly 9 mm beyond its origin (series 10, image 39). The right ICA remains occluded to the skull base. Left carotid system: No left CCA origin stenosis. Medial soft plaque throughout  the left CCA at the level of the thyroid, not hemodynamically significant. Soft and calcified plaque at the left ICA origin and bulb, not hemodynamically significant. Mid and distal cervical left ICA air negative. Vertebral arteries:No proximal right subclavian artery stenosis. Normal right vertebral artery origin. Negative cervical right vertebral artery. No proximal left subclavian artery stenosis despite soft plaque. Soft plaque at the left vertebral artery origin and V1 segment with only mild stenosis. Otherwise negative cervical left vertebral artery. CTA HEAD Posterior circulation: Codominant distal vertebral arteries without stenosis. Patent PICA origins. Patent vertebrobasilar junction. No basilar stenosis. Normal SCA and PCA origins. Posterior communicating arteries are diminutive or absent. Mild bilateral P1 and P2 segment irregularity, no PCA stenosis identified. Anterior circulation: Left ICA siphon is patent with mild calcified plaque and no stenosis. Left ICA terminus, left MCA and left ACA origins are normal. Right ICA siphon is occluded through the cavernous segment. Beginning at the anterior genu there is reconstitution of the right ICA siphon, and the right ICA terminus is patent. The right MCA origin is patent but the mid M1 segment is occluded about 11 mm from the origin. There is minimal reconstituted enhancement in the proximal M2 segments. The right ACA origin is patent but diminutive and appears somewhat stenotic (series 8, image 89). The left A1 segment may be dominant. A patent anterior communicating artery is identified. The bilateral ACA A2 segments and distal ACA branches appear symmetric and within normal limits. Venous sinuses: Patent. Anatomic variants: Possibly dominant left ACA A1 segment. Review of the MIP images confirms the above findings IMPRESSION: 1. Positive for emergent large vessel occlusion, and favorable CT perfusion findings for endovascular re-perfusion. 2. Occlusion of  the Right ICA just beyond its origin. Reconstitution of the Right ICA terminus beginning at the anterior genu (perhaps via the right ophthalmic artery). But superimposed Right MCA mid M1 segment occlusion. 3. CT perfusion indicates a core infarct of 40 mL in the Right MCA territory and right hemisphere penumbra of 146 mL. 4. The above was relayed to Dr. Ritta Slot beginning at 1059 hours. 5. Stenosis of the right ACA origin, but the right A1 segment may be non dominant. Right A2 and distal ACA enhancement is within normal limits. Normal left MCA and left ACA. 6. Aortic arch and bilateral carotid atherosclerosis without additional stenosis. 7. Mild left vertebral artery origin stenosis due to soft plaque. Otherwise  negative posterior circulation. 8. Calcified coronary artery atherosclerosis. Electronically Signed   By: Odessa Fleming M.D.   On: 03/31/2017 11:22   Mr Brain Wo Contrast  Result Date: 04/01/2017 CLINICAL DATA:  Follow-up RIGHT MCA stroke. EXAM: MRI HEAD WITHOUT CONTRAST TECHNIQUE: Multiplanar, multiecho pulse sequences of the brain and surrounding structures were obtained without intravenous contrast. COMPARISON:  CT HEAD April 01, 2017 at 1447 hours FINDINGS: BRAIN: Confluent reduced diffusion RIGHT frontal lobes with smaller foci of reduced diffusion RIGHT parietal and temporal lobe, intermediate to low ADC values. Susceptibility artifact RIGHT basal ganglia and insula corresponding to CT findings without further propagation by MRI. FLAIR T2 hyperintense signal within areas of acute infarction with regional mass effect, no significant midline shift. Partial effacement of the RIGHT lateral ventricle without hydrocephalus. Patchy additional supratentorial white matter FLAIR T2 hyperintensities compatible with mild chronic small vessel ischemic disease. No abnormal extra-axial fluid collections. VASCULAR: Normal major intracranial vascular flow voids present at skull base. SKULL AND UPPER CERVICAL  SPINE: No abnormal sellar expansion. No suspicious calvarial bone marrow signal. Craniocervical junction maintained. SINUSES/ORBITS: Moderate paranasal sinus mucosal thickening. Mastoid air cells are well aerated. The included ocular globes and orbital contents are non-suspicious. OTHER: Life-support lines in place. IMPRESSION: Acute large RIGHT MCA territory infarct with similar petechial hemorrhage. Otherwise negative noncontrast MRI head for age. Electronically Signed   By: Awilda Metro M.D.   On: 04/01/2017 23:12   Ir Delsa Sale Stent Cerv Carotid W/o Emb-prot Mod Sed  Result Date: 04/03/2017 INDICATION: 76 year old male with a history of right MCA syndrome, with imaging workup revealing acute right ICA occlusion and right MCA occlusion (tandem occlusion), with increased NIH stroke scale, and aspects of 9. EXAM: ULTRASOUND GUIDED ACCESS RIGHT COMMON FEMORAL ARTERY. CEREBRAL ANGIOGRAM. REVASCULARIZATION OF OCCLUDED RIGHT INTERNAL CAROTID ARTERY AT THE BIFURCATION WITH BALLOON ANGIOPLASTY AND EMERGENT STENTING MECHANICAL THROMBECTOMY RIGHT MCA OCCLUSION DEPLOYMENT OF CLOSURE DEVICE FOR HEMOSTASIS COMPARISON:  CT 03/31/2017, CT angiogram 03/31/2017 MEDICATIONS: 2.0 g Ancef. The antibiotic was administered within 1 hour of the procedure ANESTHESIA/SEDATION: General anesthesia CONTRAST:  180 cc Isovue FLUOROSCOPY TIME:  Fluoroscopy Time: 44 minutes 18 seconds (3,731 mGy). COMPLICATIONS: None TECHNIQUE: Emergent informed written consent was obtained after discussion with the patient who confirmed he has no family available and no friends to speak on his behalf. Angiogram and thrombectomy was performed as the standard of care for a patient presentation such as his, after a discussion with the patient regarding risks and benefits. He did acknowledge wishing to proceed to be helped. Specific risks discussed include: Bleeding, infection, contrast reaction, kidney injury/failure, need for further procedure/surgery,  arterial injury or dissection, embolization to new territory, intracranial hemorrhage (10-15% risk), neurologic deterioration, cardiopulmonary collapse, death. Maximal Sterile Barrier Technique was utilized including caps, mask, sterile gowns, sterile gloves, sterile drape, hand hygiene and skin antiseptic. A timeout was performed prior to the initiation of the procedure. PROCEDURE: Maximal Sterile Barrier Technique was utilized including during the procedure including caps, mask, sterile gowns, sterile gloves, sterile drape, hand hygiene and skin antiseptic. A timeout was performed prior to the initiation of the procedure. Ultrasound survey of the right inguinal region was performed with images stored and sent to PACs. A micropuncture needle was used access the right common femoral artery under ultrasound. With excellent arterial blood flow returned, an .018 micro wire was passed through the needle, observed to enter the abdominal aorta under fluoroscopy. The needle was removed, and a micropuncture sheath was placed over the wire. The  inner dilator and wire were removed, and an 035 Bentson wire was advanced under fluoroscopy into the abdominal aorta. The sheath was removed and a standard 5 [place] vascular sheath was placed. The dilator was removed and the sheath was flushed. A 41F JB-1 diagnostic catheter was advanced over the wire to the proximal descending thoracic aorta. Wire was then removed. Double flush of the catheter was performed. Catheter was then used to select the innominate artery. Angiogram was performed. Catheter was then advanced into the right common carotid artery. Formal angiogram was performed. Exchange length Rosen wire was then passed through the diagnostic catheter to the distal common carotid artery and the diagnostic catheter was removed. The 5 French sheath was removed and exchanged for 8 French 55 centimeter BrightTip sheath. Sheath was flushed and attached to pressurized and heparinized  saline bag for constant forward flow. Then a NeuroMax 80cm catheter was then advanced over the wire, positioned into the distal common carotid artery. Copious back flush was performed and the balloon catheter was attached to heparinized and pressurized saline bag for forward flow. . With the tip of the neuro on max position in the distal common carotid artery, micro wire and microcatheter were selected in attempt to cross the lesion at the bifurcation. A soft tipped Transcend wire and rapid transit microcatheter were selected. After the wire was navigated across the lesion, the microcatheter encountered significant resistance. The tension in the system caused the neuro on max catheter to buckle towards the aortic arch. A tandem wire using a roadrunner 035 wire was then placed adjacent to the microcatheter system with the tip of the roadrunner into the external carotid artery. With the buddy wire in place, the microcatheter was pushed across the lesion in the cervical carotid. After successfully crossing the lesion at the bifurcation, the wire and microcatheter were position in the distal cervical segment of the ICA. The wire was removed and contrast was infused to confirm intraluminal location. Exchange length Transcend wire was then placed through the microcatheter terminating at the skullbase. The microcatheter was removed, and a 4 mm by 30 mm rapid exchange system Viatrak angioplasty balloon was selected for the initial dilation. Buddy wire remained in place for pushing the balloon across the lesion. Once the balloon was position at the lesion. Inflation to a full 8 atmospheric and nominal diameter was accomplished. A second balloon angioplasty was performed. Balloon was removed from the micro wire, and the roadrunner wire was removed. Angiogram was performed. Given the appearance of the angioplasty site and poor antegrade flow through the cervical carotid, we elected to place a stent prior to thrombectomy of the  MCA. A tapered Abbott Xact 89mm/6mm x 40mm carotid stent was selected and deployed at the lesion. Repeat angiogram demonstrated improved flow with evidence of dissection continuing above the stented segment. Given there was adequate antegrade flow, we elected to proceed with mechanical thrombectomy. In intermediate CAT 6 catheter was then passed over the Transcend wire to the skullbase, with copious backflow performed. The hub of the rotating hemostatic valve was attached to pressurized heparinized saline back. Pro view 18 catheter was then selected and advanced over the exchange wire to the skullbase. The exchange wire was then removed and synchro soft micro wire was selected to navigate into the intracranial ICA. Angiogram through the intermediate catheter was performed at the skullbase, and the microcatheter and micro wire were advanced across the proximal M1 occlusion under roadmap angiogram. While the microcatheter was being advanced through the occlusion  over the wire, the intermediate catheter was observed to travel retrograde in the cervical carotid. At this point, we recognized that the neuro on max catheter had withdrawn into the aortic arch, and the entire system needed to be reduced in order to remove the redundancy of the telescoping system. Once the redundancy was removed, NeuronMax was positioned again in the distal common carotid artery, intermediate catheter at the skullbase, and the microcatheter beyond the M1 occlusion, the synchro soft was removed and small injection through the microcatheter confirmed intraluminal location within insular branch location. A rotating hemostatic valve was then attached to the back end of the microcatheter, and a pressurized and heparinized saline bag was attached to the catheter. 4 x 40 solitaire device was then selected. Back flush was achieved at the rotating hemostatic valve, and then the device was gently advanced through the microcatheter to the distal end. The  retriever was then unsheathed by withdrawing the microcatheter under fluoroscopy. Once the retriever was completely unsheathed, control angiogram was performed from the intermediate catheter. 3 minute time clock was observed. Constant aspiration was then performed at the tip of the intermediate catheter as the retriever was gently and slowly withdrawn with fluoroscopic observation. Once the retriever was entirely removed from the system, free aspiration was confirmed at the hub of the intermediate catheter, with free blood return confirmed. Control angiogram was then performed.  TICI 2b Second pass was then attempted. Microcatheter in the micro wire were then advanced through the intermediate catheter, navigated into the insular branches of the right MCA territory under roadmap angiogram. Once the microcatheter was passed distally into the selected branch, the wire was removed and aspiration at the hub of the macro catheter was performed to confirm return of blood. Gentle contrast injection confirmed intraluminal location within the selected insular branch. The same 4 x 40 solitaire device was then used. Back flush was achieved at the rotating hemostatic valve on the microcatheter, and then the device was gently advanced through the microcatheter to the distal end. The retriever was then unsheathed by withdrawing the microcatheter under fluoroscopy. Once the retriever was completely unsheathed, control angiogram was performed from the intermediate catheter. 3 minute time clock was again observed. Constant aspiration was then performed at the tip of the intermediate catheter as the retriever was gently and slowly withdrawn with fluoroscopic observation. Once the retriever was entirely removed from the system, free aspiration was confirmed at the hub of the intermediate catheter, with free blood return confirmed. Control angiogram was performed with TICI 3 flow. Control angiogram from the NeuronMax within the common  carotid artery was performed, demonstrating occlusion at the stent site. 035 Rosen wire was advanced through the intermediate catheter to the skullbase, a rotating hemostatic valve was placed on the back end of the intermediate catheter, an the intermediate catheter was withdrawn slowly under fluoroscopic observation with gentle contrast injection to identified the distal site of the thrombus in the cervical segment of the internal carotid artery. This site was just beyond the stent at the bifurcation. Intermediate catheter was then advanced to the skullbase over the [employer], Rosen wire was removed. An the exchange length Transcend wire was placed into the internal carotid artery. Intermediate catheter was withdrawn. A non tapered Acculink carotid stent 6 mm x 30mm was selected to match the distal 6 mm diameter of the previously placed stent. This stent was deployed under fluoroscopic road map, with slight distal migration, with the proximal stent margin just beyond the distal stent  margin of the first stent. Delivery device was removed an a control angiogram was performed demonstrating hemodynamically significant stenosis at the short interval between the stents. A third stent was then deployed at the interval, with an Abbott Xact, 7mm x 20mm stent selected. Delivery device was removed. Control angiogram was performed at the cervical region confirming no flow limiting stenosis with excellent flow through the stent system. Control angiogram was performed of the intracranial vasculature demonstrating resolution of the flow limiting stenosis at the stent system with continued full reperfusion of the right hemisphere. NeuronMax was then removed. Diagnostic angiogram of the left carotid system was then performed using JB 1 catheter. All catheters wires were removed an and the 8 [place] by 55 cm bright tip sheath was exchanged for a short 8 [place] sheath. Control angiogram was performed at the right common femoral  artery access site. Exoseal was deployed for hemostasis. Patient remained hemodynamically stable throughout. No complications were encountered and no significant blood loss encountered. FINDINGS: Ultrasound survey of the right inguinal region demonstrates widely patent common femoral artery with no significant plaque. Right common carotid artery: Initial angiogram of the right common carotid artery demonstrates normal course caliber and contour with no significant atherosclerotic changes. Right external carotid artery: Patent with antegrade flow. Right internal carotid artery: Occlusion of the right internal carotid artery just beyond the bifurcation with no string sign. Angiogram after balloon angioplasty of the carotid occlusion demonstrates hemodynamically significant stenosis with irregular lumen and spiral Ing filling defect extending from the proximal ICA nearly to the skullbase. There is associated luminal thrombus in the distal cervical segment. After the initial balloon angioplasty, there is tubular tubular and spiraling filling defect of the cavernous segment extending through the supraclinoid segment to the carotid terminus. After treatment of the tandem occlusion and placement of stent system at the bifurcation extending into the right cervical ICA, there is adequate treatment of presumed dissection at the occlusion, as well as the associated luminal thrombus, with no hemodynamically significant stenosis and excellent flow through the stent system. After mechanical thrombectomy, the distal cervical ICA and the intracranial ICA to the carotid terminus demonstrate resolution of filling defect, which was presumed thrombus. Right MCA: Initial angiogram demonstrates proximal M1 occlusion with no flow. The distal MCA branches cross fill through anterior cerebral circulation and leptomeningeal collaterals. After the first pass with solitaire 4 x 40 and local aspiration there is TICI 2b flow established.  Residual filling defect at the distal M1 extending into M2 branches, as well as residual filling defect in the carotid siphon. After the second pass with solitaire 4 x 40 and local aspiration there is complete restoration of flow within the right MCA compatible with TICI 3 flow. No residual filling defect in the carotid siphon. Before the stent system was completed at the carotid bifurcation, the intracranial injection demonstrated no flow within the right A1 segment, which was likely related to competing cross perfusion from the left. After stent system was completed at the carotid bifurcation, intracranial control angiogram demonstrates antegrade flow through the carotid siphon, right MCA, right ACA, with maintained TICI 3 flow. Right ACA: Initial injection demonstrates patency of the right A1 segment, with perfusion of the right ACA territory. There is cross filling through patent anterior communicating artery, with some perfusion of the left ACA territory. Before the completion of the right carotid stent system, hemodynamically significant stenosis precluded adequate filling of the A1 segment secondary to competing cross flow from the left. After completion of  the stent system, this flow phenomenon resolved, with complete filling of the right A1 and A2 branches. Left common carotid artery:  Normal course caliber and contour. Left ICA: Normal course caliber and contour with no significant atherosclerotic changes. Patent 81 segment and anterior communicating artery with cross flow to the right. No filling defect or intracranial stenosis. Left external carotid artery: Unremarkable course caliber and contour and remains patent. Right common femoral artery: Adequate puncture site of the common femoral artery. 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 Deployment of Exoseal for hemostasis. Signed, Yvone Neu. Loreta Ave DO [facility] Radiology PLAN: Patient is stable to CT department for head CT Admit to neuro ICU. First 24 hours target systolic blood pressure of 120-140. Right hip straight for 6 hours status post removal of 8 French sheath. Electronically Signed   By: Gilmer Mor D.O.   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 EXAM: ULTRASOUND GUIDED ACCESS RIGHT COMMON FEMORAL ARTERY. CEREBRAL ANGIOGRAM. REVASCULARIZATION OF OCCLUDED RIGHT INTERNAL CAROTID ARTERY AT THE BIFURCATION WITH BALLOON ANGIOPLASTY AND EMERGENT STENTING MECHANICAL THROMBECTOMY RIGHT MCA OCCLUSION DEPLOYMENT OF CLOSURE DEVICE FOR HEMOSTASIS COMPARISON:  CT 03/31/2017, CT angiogram 03/31/2017 MEDICATIONS: 2.0 g Ancef. The antibiotic was administered within 1 hour of the procedure ANESTHESIA/SEDATION: General anesthesia CONTRAST:  180 cc Isovue FLUOROSCOPY TIME:  Fluoroscopy Time: 44 minutes 18 seconds (3,731 mGy). COMPLICATIONS: None TECHNIQUE: Emergent informed written consent was obtained after discussion with the patient who confirmed he has no family available and no friends to speak on his behalf. Angiogram and thrombectomy was performed as the standard of care for a patient presentation such as his, after a discussion with the patient regarding risks and benefits. He did acknowledge wishing to proceed to be helped. Specific risks discussed include: Bleeding, infection, contrast reaction, kidney injury/failure, need for further procedure/surgery, arterial injury or dissection, embolization to  new territory, intracranial hemorrhage (10-15% risk), neurologic deterioration, cardiopulmonary collapse, death. Maximal Sterile Barrier Technique was utilized including caps, mask, sterile gowns, sterile gloves, sterile drape, hand hygiene and skin antiseptic. A timeout was performed prior to the initiation of the procedure. PROCEDURE: Maximal Sterile Barrier Technique was utilized including during the procedure including caps, mask, sterile gowns, sterile gloves, sterile drape, hand hygiene and skin antiseptic. A timeout was performed prior to the initiation of the procedure. Ultrasound survey of the right inguinal region was performed with images stored and sent to PACs. A micropuncture needle was used access the right common femoral artery under ultrasound. With excellent arterial blood flow returned, an .018 micro wire was passed through the needle, observed to enter the abdominal aorta under fluoroscopy. The needle was removed, and a micropuncture sheath was placed over the wire. The inner dilator and wire were removed, and an 035 Bentson wire was advanced under fluoroscopy into the abdominal aorta. The sheath was removed and a standard 5 [place] vascular sheath was placed. The dilator was removed and the sheath was flushed. A 5F JB-1 diagnostic catheter was advanced over the  wire to the proximal descending thoracic aorta. Wire was then removed. Double flush of the catheter was performed. Catheter was then used to select the innominate artery. Angiogram was performed. Catheter was then advanced into the right common carotid artery. Formal angiogram was performed. Exchange length Rosen wire was then passed through the diagnostic catheter to the distal common carotid artery and the diagnostic catheter was removed. The 5 French sheath was removed and exchanged for 8 French 55 centimeter BrightTip sheath. Sheath was flushed and attached to pressurized and heparinized saline bag for constant forward flow. Then a  NeuroMax 80cm catheter was then advanced over the wire, positioned into the distal common carotid artery. Copious back flush was performed and the balloon catheter was attached to heparinized and pressurized saline bag for forward flow. . With the tip of the neuro on max position in the distal common carotid artery, micro wire and microcatheter were selected in attempt to cross the lesion at the bifurcation. A soft tipped Transcend wire and rapid transit microcatheter were selected. After the wire was navigated across the lesion, the microcatheter encountered significant resistance. The tension in the system caused the neuro on max catheter to buckle towards the aortic arch. A tandem wire using a roadrunner 035 wire was then placed adjacent to the microcatheter system with the tip of the roadrunner into the external carotid artery. With the buddy wire in place, the microcatheter was pushed across the lesion in the cervical carotid. After successfully crossing the lesion at the bifurcation, the wire and microcatheter were position in the distal cervical segment of the ICA. The wire was removed and contrast was infused to confirm intraluminal location. Exchange length Transcend wire was then placed through the microcatheter terminating at the skullbase. The microcatheter was removed, and a 4 mm by 30 mm rapid exchange system Viatrak angioplasty balloon was selected for the initial dilation. Buddy wire remained in place for pushing the balloon across the lesion. Once the balloon was position at the lesion. Inflation to a full 8 atmospheric and nominal diameter was accomplished. A second balloon angioplasty was performed. Balloon was removed from the micro wire, and the roadrunner wire was removed. Angiogram was performed. Given the appearance of the angioplasty site and poor antegrade flow through the cervical carotid, we elected to place a stent prior to thrombectomy of the MCA. A tapered Abbott Xact 53mm/6mm x 40mm  carotid stent was selected and deployed at the lesion. Repeat angiogram demonstrated improved flow with evidence of dissection continuing above the stented segment. Given there was adequate antegrade flow, we elected to proceed with mechanical thrombectomy. In intermediate CAT 6 catheter was then passed over the Transcend wire to the skullbase, with copious backflow performed. The hub of the rotating hemostatic valve was attached to pressurized heparinized saline back. Pro view 18 catheter was then selected and advanced over the exchange wire to the skullbase. The exchange wire was then removed and synchro soft micro wire was selected to navigate into the intracranial ICA. Angiogram through the intermediate catheter was performed at the skullbase, and the microcatheter and micro wire were advanced across the proximal M1 occlusion under roadmap angiogram. While the microcatheter was being advanced through the occlusion over the wire, the intermediate catheter was observed to travel retrograde in the cervical carotid. At this point, we recognized that the neuro on max catheter had withdrawn into the aortic arch, and the entire system needed to be reduced in order to remove the redundancy of the telescoping system.  Once the redundancy was removed, NeuronMax was positioned again in the distal common carotid artery, intermediate catheter at the skullbase, and the microcatheter beyond the M1 occlusion, the synchro soft was removed and small injection through the microcatheter confirmed intraluminal location within insular branch location. A rotating hemostatic valve was then attached to the back end of the microcatheter, and a pressurized and heparinized saline bag was attached to the catheter. 4 x 40 solitaire device was then selected. Back flush was achieved at the rotating hemostatic valve, and then the device was gently advanced through the microcatheter to the distal end. The retriever was then unsheathed by  withdrawing the microcatheter under fluoroscopy. Once the retriever was completely unsheathed, control angiogram was performed from the intermediate catheter. 3 minute time clock was observed. Constant aspiration was then performed at the tip of the intermediate catheter as the retriever was gently and slowly withdrawn with fluoroscopic observation. Once the retriever was entirely removed from the system, free aspiration was confirmed at the hub of the intermediate catheter, with free blood return confirmed. Control angiogram was then performed.  TICI 2b Second pass was then attempted. Microcatheter in the micro wire were then advanced through the intermediate catheter, navigated into the insular branches of the right MCA territory under roadmap angiogram. Once the microcatheter was passed distally into the selected branch, the wire was removed and aspiration at the hub of the macro catheter was performed to confirm return of blood. Gentle contrast injection confirmed intraluminal location within the selected insular branch. The same 4 x 40 solitaire device was then used. Back flush was achieved at the rotating hemostatic valve on the microcatheter, and then the device was gently advanced through the microcatheter to the distal end. The retriever was then unsheathed by withdrawing the microcatheter under fluoroscopy. Once the retriever was completely unsheathed, control angiogram was performed from the intermediate catheter. 3 minute time clock was again observed. Constant aspiration was then performed at the tip of the intermediate catheter as the retriever was gently and slowly withdrawn with fluoroscopic observation. Once the retriever was entirely removed from the system, free aspiration was confirmed at the hub of the intermediate catheter, with free blood return confirmed. Control angiogram was performed with TICI 3 flow. Control angiogram from the NeuronMax within the common carotid artery was performed,  demonstrating occlusion at the stent site. 035 Rosen wire was advanced through the intermediate catheter to the skullbase, a rotating hemostatic valve was placed on the back end of the intermediate catheter, an the intermediate catheter was withdrawn slowly under fluoroscopic observation with gentle contrast injection to identified the distal site of the thrombus in the cervical segment of the internal carotid artery. This site was just beyond the stent at the bifurcation. Intermediate catheter was then advanced to the skullbase over the [employer], Rosen wire was removed. An the exchange length Transcend wire was placed into the internal carotid artery. Intermediate catheter was withdrawn. A non tapered Acculink carotid stent 6 mm x 30mm was selected to match the distal 6 mm diameter of the previously placed stent. This stent was deployed under fluoroscopic road map, with slight distal migration, with the proximal stent margin just beyond the distal stent margin of the first stent. Delivery device was removed an a control angiogram was performed demonstrating hemodynamically significant stenosis at the short interval between the stents. A third stent was then deployed at the interval, with an Abbott Xact, 7mm x 20mm stent selected. Delivery device was removed. Control angiogram  was performed at the cervical region confirming no flow limiting stenosis with excellent flow through the stent system. Control angiogram was performed of the intracranial vasculature demonstrating resolution of the flow limiting stenosis at the stent system with continued full reperfusion of the right hemisphere. NeuronMax was then removed. Diagnostic angiogram of the left carotid system was then performed using JB 1 catheter. All catheters wires were removed an and the 8 [place] by 55 cm bright tip sheath was exchanged for a short 8 [place] sheath. Control angiogram was performed at the right common femoral artery access site. Exoseal was  deployed for hemostasis. Patient remained hemodynamically stable throughout. No complications were encountered and no significant blood loss encountered. FINDINGS: Ultrasound survey of the right inguinal region demonstrates widely patent common femoral artery with no significant plaque. Right common carotid artery: Initial angiogram of the right common carotid artery demonstrates normal course caliber and contour with no significant atherosclerotic changes. Right external carotid artery: Patent with antegrade flow. Right internal carotid artery: Occlusion of the right internal carotid artery just beyond the bifurcation with no string sign. Angiogram after balloon angioplasty of the carotid occlusion demonstrates hemodynamically significant stenosis with irregular lumen and spiral Ing filling defect extending from the proximal ICA nearly to the skullbase. There is associated luminal thrombus in the distal cervical segment. After the initial balloon angioplasty, there is tubular tubular and spiraling filling defect of the cavernous segment extending through the supraclinoid segment to the carotid terminus. After treatment of the tandem occlusion and placement of stent system at the bifurcation extending into the right cervical ICA, there is adequate treatment of presumed dissection at the occlusion, as well as the associated luminal thrombus, with no hemodynamically significant stenosis and excellent flow through the stent system. After mechanical thrombectomy, the distal cervical ICA and the intracranial ICA to the carotid terminus demonstrate resolution of filling defect, which was presumed thrombus. Right MCA: Initial angiogram demonstrates proximal M1 occlusion with no flow. The distal MCA branches cross fill through anterior cerebral circulation and leptomeningeal collaterals. After the first pass with solitaire 4 x 40 and local aspiration there is TICI 2b flow established. Residual filling defect at the distal  M1 extending into M2 branches, as well as residual filling defect in the carotid siphon. After the second pass with solitaire 4 x 40 and local aspiration there is complete restoration of flow within the right MCA compatible with TICI 3 flow. No residual filling defect in the carotid siphon. Before the stent system was completed at the carotid bifurcation, the intracranial injection demonstrated no flow within the right A1 segment, which was likely related to competing cross perfusion from the left. After stent system was completed at the carotid bifurcation, intracranial control angiogram demonstrates antegrade flow through the carotid siphon, right MCA, right ACA, with maintained TICI 3 flow. Right ACA: Initial injection demonstrates patency of the right A1 segment, with perfusion of the right ACA territory. There is cross filling through patent anterior communicating artery, with some perfusion of the left ACA territory. Before the completion of the right carotid stent system, hemodynamically significant stenosis precluded adequate filling of the A1 segment secondary to competing cross flow from the left. After completion of the stent system, this flow phenomenon resolved, with complete filling of the right A1 and A2 branches. Left common carotid artery:  Normal course caliber and contour. Left ICA: Normal course caliber and contour with no significant atherosclerotic changes. Patent 81 segment and anterior communicating artery with cross flow to  the right. No filling defect or intracranial stenosis. Left external carotid artery: Unremarkable course caliber and contour and remains patent. Right common femoral artery: Adequate puncture site of the common femoral artery. IMPRESSION: Status post cerebral angiogram and treatment of tandem occlusion of the right ICA and right M1, with emergent stent system of the right ICA for treatment of dissection and intraluminal thrombus, and mechanical thrombectomy of the right  MCA and carotid siphon, with restoration of TICI 3 flow. Deployment of Exoseal for hemostasis. Signed, Yvone Neu. Loreta Ave DO [facility] Radiology PLAN: Patient is stable to CT department for head CT Admit to neuro ICU. First 24 hours target systolic blood pressure of 120-140. Right hip straight for 6 hours status post removal of 8 French sheath. Electronically Signed   By: Gilmer Mor D.O.   On: 04/01/2017 11:23   Ct Cerebral Perfusion W Contrast  Result Date: 03/31/2017 CLINICAL DATA:  76 year old male with left side weakness and evidence of right MCA emergent large vessel occlusion on noncontrast head CT. EXAM: CT ANGIOGRAPHY HEAD AND NECK CT PERFUSION BRAIN TECHNIQUE: Multidetector CT imaging of the head and neck was performed using the standard protocol during bolus administration of intravenous contrast. Multiplanar CT image reconstructions and MIPs were obtained to evaluate the vascular anatomy. Carotid stenosis measurements (when applicable) are obtained utilizing NASCET criteria, using the distal internal carotid diameter as the denominator. Multiphase CT imaging of the brain was performed following IV bolus contrast injection. Subsequent parametric perfusion maps were calculated using RAPID software. CONTRAST:  90 mL Isovue 370 COMPARISON:  Noncontrast head CT 1048 hours today. FINDINGS: CT Brain Perfusion Findings: CBF (<30%) Volume: 40 mL (using CBF less than 30%). Perfusion (Tmax>6.0s) volume: 185 mL (T-max greater than 6 seconds). Mismatch Volume: 146 mL (mismatch  ratio 4.6). Infarction Location:Right MCA Perfusion findings were reviewed in person with Dr. Ritta Slot at 1059 hours. CTA NECK Skeleton: No acute osseous abnormality identified. Age concordant degenerative changes in the cervical spine. Intermittent carious dentition. Upper chest: Negative; no superior mediastinal lymphadenopathy. There is calcified coronary artery  atherosclerosis. Other neck: Negative thyroid, larynx, pharynx, parapharyngeal spaces, retropharyngeal space, sublingual space, submandibular glands and parotid glands. No cervical lymphadenopathy. Aortic arch: Moderate soft and atherosclerotic arch atherosclerosis. Three vessel arch configuration. Right carotid system: No brachiocephalic artery or right CCA origin stenosis. The right carotid bifurcation is patent, but the right ICA origin is occluded abruptly 9 mm beyond its origin (series 10, image 39). The right ICA remains occluded to the skull base. Left carotid system: No left CCA origin stenosis. Medial soft plaque throughout the left CCA at the level of the thyroid, not hemodynamically significant. Soft and calcified plaque at the left ICA origin and bulb, not hemodynamically significant. Mid and distal cervical left ICA air negative. Vertebral arteries:No proximal right subclavian artery stenosis. Normal right vertebral artery origin. Negative cervical right vertebral artery. No proximal left subclavian artery stenosis despite soft plaque. Soft plaque at the left vertebral artery origin and V1 segment with only mild stenosis. Otherwise negative cervical left vertebral artery. CTA HEAD Posterior circulation: Codominant distal vertebral arteries without stenosis. Patent PICA origins. Patent vertebrobasilar junction. No basilar stenosis. Normal SCA and PCA origins. Posterior communicating arteries are diminutive or absent. Mild bilateral P1 and P2 segment irregularity, no PCA stenosis identified. Anterior circulation: Left ICA siphon is patent with mild calcified plaque and no stenosis. Left ICA terminus, left MCA and left ACA origins are normal. Right ICA siphon is  occluded through the cavernous segment. Beginning at the anterior genu there is reconstitution of the right ICA siphon, and the right ICA terminus is patent. The right MCA origin is patent but the mid M1 segment is occluded about 11 mm from the  origin. There is minimal reconstituted enhancement in the proximal M2 segments. The right ACA origin is patent but diminutive and appears somewhat stenotic (series 8, image 89). The left A1 segment may be dominant. A patent anterior communicating artery is identified. The bilateral ACA A2 segments and distal ACA branches appear symmetric and within normal limits. Venous sinuses: Patent. Anatomic variants: Possibly dominant left ACA A1 segment. Review of the MIP images confirms the above findings IMPRESSION: 1. Positive for emergent large vessel occlusion, and favorable CT perfusion findings for endovascular re-perfusion. 2. Occlusion of the Right ICA just beyond its origin. Reconstitution of the Right ICA terminus beginning at the anterior genu (perhaps via the right ophthalmic artery). But superimposed Right MCA mid M1 segment occlusion. 3. CT perfusion indicates a core infarct of 40 mL in the Right MCA territory and right hemisphere penumbra of 146 mL. 4. The above was relayed to Dr. Ritta Slot beginning at 1059 hours. 5. Stenosis of the right ACA origin, but the right A1 segment may be non dominant. Right A2 and distal ACA enhancement is within normal limits. Normal left MCA and left ACA. 6. Aortic arch and bilateral carotid atherosclerosis without additional stenosis. 7. Mild left vertebral artery origin stenosis due to soft plaque. Otherwise negative posterior circulation. 8. Calcified coronary artery atherosclerosis. Electronically Signed   By: Odessa Fleming M.D.   On: 03/31/2017 11:22   Dg Chest Port 1 View  Result Date: 04/11/2017 CLINICAL DATA:  Leukocytosis. EXAM: PORTABLE CHEST 1 VIEW COMPARISON:  Chest radiograph 04/09/2017. FINDINGS: Low lung volumes persist. Unchanged bibasilar opacities. Unchanged heart size and mediastinal contours. No pulmonary edema or pneumothorax. IMPRESSION: Unchanged bibasilar opacities which may be atelectasis, pneumonia or aspiration given distribution. Possible small  pleural effusions. Electronically Signed   By: Rubye Oaks M.D.   On: 04/11/2017 21:44   Dg Chest Port 1 View  Result Date: 04/09/2017 CLINICAL DATA:  Pleural effusion on the right.  Fever. EXAM: PORTABLE CHEST 1 VIEW COMPARISON:  04/02/2017 FINDINGS: Removed nasogastric tube. Haziness of the bilateral lower chest without air bronchogram or Kerley line. Normal heart size and mediastinal contours. IMPRESSION: Symmetric opacities at the bases which could be atelectasis or pneumonia. If present, pleural effusions would be small. Electronically Signed   By: Marnee Spring M.D.   On: 04/09/2017 18:06   Dg Chest Port 1 View  Result Date: 04/02/2017 CLINICAL DATA:  Acute cerebral infarction.  LEFT-sided weakness. EXAM: PORTABLE CHEST 1 VIEW COMPARISON:  04/01/2017. FINDINGS: Support tubes and lines stable. Bibasilar atelectasis, worse on the LEFT. Increasing small LEFT effusion. No pneumothorax. IMPRESSION: Slight worsening aeration. Electronically Signed   By: Elsie Stain M.D.   On: 04/02/2017 07:29   Dg Chest Port 1 View  Result Date: 04/01/2017 CLINICAL DATA:  Continued surveillance.  Stroke. EXAM: PORTABLE CHEST 1 VIEW COMPARISON:  03/31/2017. FINDINGS: Normal cardiomediastinal silhouette. ET tube good position. Early bibasilar atelectasis. No consolidation or frank edema. No pneumothorax. Enteric tube. IMPRESSION: Early bibasilar atelectasis similar to priors.  ETT stable. Electronically Signed   By: Elsie Stain M.D.   On: 04/01/2017 08:11   Dg Chest Port 1 View  Result Date: 03/31/2017 CLINICAL DATA:  Acute respiratory failure EXAM: PORTABLE CHEST 1 VIEW COMPARISON:  06/02/2013 FINDINGS: Endotracheal tube tip just below the clavicular heads. An orogastric tube reaches the stomach, with side port near the GE junction. Low volume chest with mild atelectasis. No edema, effusion, or pneumothorax. EKG leads create artifact over the chest. IMPRESSION: 1. Unremarkable positioning of endotracheal and  orogastric tubes. 2. Low volumes with mild atelectasis. Electronically Signed   By: Marnee Spring M.D.   On: 03/31/2017 16:42   Dg Swallowing Func-speech Pathology  Result Date: 04/04/2017 Objective Swallowing Evaluation: Type of Study: MBS-Modified Barium Swallow Study Patient Details Name: ADREYAN CARBAJAL MRN: [id number] Date of Birth: [birth year] Today's Date: 04/04/2017 Time: SLP Start Time (ACUTE ONLY): 0950-SLP Stop Time (ACUTE ONLY): 1017 SLP Time Calculation (min) (ACUTE ONLY): 27 min Past Medical History: Past Medical History: Diagnosis Date . No pertinent past medical history  Past Surgical History: Past Surgical History: Procedure Laterality Date . APPENDECTOMY   . IR ANGIO INTRA EXTRACRAN SEL COM CAROTID INNOMINATE UNI L MOD SED  03/31/2017 . IR INTRAVSC STENT CERV CAROTID W/O EMB-PROT MOD SED INC ANGIO  03/31/2017 . IR PERCUTANEOUS ART THROMBECTOMY/INFUSION INTRACRANIAL INC DIAG ANGIO  03/31/2017 . IR US GUIDE VASC ACCESS RIGHT  03/31/2017 . LAPAROSCOPIC APPENDECTOMY  06/21/2012  Procedure: APPENDECTOMY LAPAROSCOPIC;  Surgeon: Kandis Cocking, MD;  Location: [facility] ORS;  Service: General;  Laterality: N/A; . NO PAST SURGERIES   . RADIOLOGY WITH ANESTHESIA N/A 03/31/2017  Procedure: RADIOLOGY WITH ANESTHESIA;  Surgeon: Gilmer Mor, DO;  Location: [facility] OR;  Service: Anesthesiology;  Laterality: N/A; HPI: Mr.Vinay J Bramlettis a 76 y.o.malewith no significant past medical historypresenting with left hemiparesis.Hedidnot receive IV t-PA due to late presentation. Dx with hemorrhagic right MCA infarct. Status post IR thrombectomy and stenting 4/27, remained intubated post procedure. Extubated 4/29. Subjective: pt awake in chair Assessment / Plan / Recommendation CHL IP CLINICAL IMPRESSIONS 04/04/2017 Clinical Impression Pt presents with moderately severe oropharyngeal dysphagia with sensorimotor deficits. Pt presents with decreased oral control resulting in delayed oral transiting and decreased bolus cohesion  with compromise propulsion.  Pharyngeal swallow characterized by hypomotility allowing severe residuals without awareness and laryngeal penetration of thin and nectar to vocal folds. Various postures including head turn left, chin tuck, both combined, cues for effortful swallow did not fully clear residuals. Pt's cough is weak at this time which will decrease airway protection with po. Fortunately, pt can swallow on command and is motivated to improve.  Recommend NPO except single SMALL ice chips allowed to fully melt. If goal is to discharge hospital within the next few days, pt may benefit from consideration for longer term alternative means of nutrition as his level of dysphagia currently prevents adequate po intake. Using teach back, live video, educated pt to findings and goals of improved vallecular clearance (via "hock") and strengthening cough/voice to improve swallow function/airway protection.  SLP Visit Diagnosis Dysphagia, oropharyngeal phase (R13.12) Attention and concentration deficit following -- Frontal lobe and executive function deficit following -- Impact on safety and function Severe aspiration risk;Risk for inadequate nutrition/hydration;Moderate aspiration risk   CHL IP TREATMENT RECOMMENDATION 04/04/2017 Treatment Recommendations Therapy as outlined in treatment plan below   Prognosis 04/04/2017 Prognosis for Safe Diet Advancement Fair Barriers to Reach Goals Severity of deficits Barriers/Prognosis Comment -- CHL IP DIET RECOMMENDATION 04/04/2017 SLP Diet Recommendations NPO;Ice chips PRN after oral care Liquid Administration via -- Medication Administration Via alternative means Compensations Slow rate;Small sips/bites;Follow solids with liquid;Other (Comment);Effortful swallow Postural Changes Seated upright at 90 degrees   CHL IP OTHER RECOMMENDATIONS 04/04/2017 Recommended  Consults -- Oral Care Recommendations Oral care QID Other Recommendations Have oral suction available   CHL IP FOLLOW UP  RECOMMENDATIONS 04/04/2017 Follow up Recommendations [facility] F[facility]QU[facility] D[facility] 04/04/2017 Speech Therapy Frequency (ACUTE ONLY) min 2x/week Treatment Duration 2 weeks      CHL IP ORAL PHASE 04/04/2017 Oral Phase Impaired Oral - Pudding Teaspoon -- Oral - Pudding Cup -- Oral - Honey Teaspoon Weak lingual manipulation;Reduced posterior propulsion;Decreased bolus cohesion;Premature spillage Oral - Honey Cup Weak lingual manipulation;Reduced posterior propulsion;Premature spillage;Decreased bolus cohesion Oral - Nectar Teaspoon Weak lingual manipulation;Decreased bolus cohesion;Premature spillage;Reduced posterior propulsion Oral - Nectar Cup Weak lingual manipulation;Reduced posterior propulsion;Premature spillage;Decreased bolus cohesion Oral - Nectar Straw Weak lingual manipulation;Reduced posterior propulsion;Decreased bolus cohesion;Delayed oral transit Oral - Thin Teaspoon Weak lingual manipulation;Reduced posterior propulsion;Decreased bolus cohesion;Premature spillage Oral - Thin Cup Weak lingual manipulation;Reduced posterior propulsion;Premature spillage;Decreased bolus cohesion Oral - Thin Straw -- Oral - Puree Weak lingual manipulation;Decreased bolus cohesion;Reduced posterior propulsion Oral - Mech Soft -- Oral - Regular Weak lingual manipulation;Reduced posterior propulsion;Premature spillage;Decreased bolus cohesion Oral - Multi-Consistency -- Oral - Pill -- Oral Phase - Comment --  CHL IP PHARYNGEAL PHASE 04/04/2017 Pharyngeal Phase Impaired Pharyngeal- Pudding Teaspoon -- Pharyngeal -- Pharyngeal- Pudding Cup -- Pharyngeal -- Pharyngeal- Honey Teaspoon Reduced epiglottic inversion;Reduced pharyngeal peristalsis;Reduced anterior laryngeal mobility;Reduced laryngeal elevation;Pharyngeal residue - valleculae;Pharyngeal residue - pyriform;Reduced tongue base retraction Pharyngeal -- Pharyngeal- Honey Cup Reduced pharyngeal peristalsis;Reduced epiglottic inversion;Reduced anterior  laryngeal mobility;Reduced laryngeal elevation;Reduced airway/laryngeal closure;Reduced tongue base retraction;Pharyngeal residue - valleculae;Pharyngeal residue - pyriform Pharyngeal -- Pharyngeal- Nectar Teaspoon Reduced pharyngeal peristalsis;Reduced epiglottic inversion;Reduced anterior laryngeal mobility;Reduced laryngeal elevation;Reduced airway/laryngeal closure;Reduced tongue base retraction;Pharyngeal residue - valleculae;Pharyngeal residue - pyriform Pharyngeal -- Pharyngeal- Nectar Cup Reduced laryngeal elevation;Reduced pharyngeal peristalsis;Reduced epiglottic inversion;Reduced anterior laryngeal mobility;Reduced airway/laryngeal closure;Reduced tongue base retraction;Pharyngeal residue - valleculae;Pharyngeal residue - pyriform;Penetration/Aspiration during swallow Pharyngeal Material enters airway, remains ABOVE vocal cords and not ejected out Pharyngeal- Nectar Straw Reduced laryngeal elevation;Reduced anterior laryngeal mobility;Reduced epiglottic inversion;Reduced pharyngeal peristalsis;Reduced airway/laryngeal closure;Reduced tongue base retraction;Penetration/Aspiration during swallow Pharyngeal Material enters airway, CONTACTS cords and not ejected out Pharyngeal- Thin Teaspoon Reduced pharyngeal peristalsis;Reduced epiglottic inversion;Reduced anterior laryngeal mobility;Reduced laryngeal elevation;Reduced airway/laryngeal closure;Reduced tongue base retraction;Pharyngeal residue - valleculae;Pharyngeal residue - pyriform Pharyngeal -- Pharyngeal- Thin Cup Reduced pharyngeal peristalsis;Reduced epiglottic inversion;Reduced anterior laryngeal mobility;Reduced laryngeal elevation;Reduced airway/laryngeal closure;Penetration/Aspiration during swallow;Reduced tongue base retraction;Pharyngeal residue - valleculae;Pharyngeal residue - pyriform Pharyngeal Material enters airway, CONTACTS cords and not ejected out Pharyngeal- Thin Straw -- Pharyngeal -- Pharyngeal- Puree Reduced pharyngeal  peristalsis;Reduced epiglottic inversion;Reduced anterior laryngeal mobility;Reduced airway/laryngeal closure;Reduced tongue base retraction;Pharyngeal residue - valleculae Pharyngeal -- Pharyngeal- Mechanical Soft -- Pharyngeal -- Pharyngeal- Regular Reduced pharyngeal peristalsis;Reduced epiglottic inversion;Reduced anterior laryngeal mobility;Reduced laryngeal elevation;Reduced airway/laryngeal closure;Reduced tongue base retraction;Pharyngeal residue - valleculae Pharyngeal -- Pharyngeal- Multi-consistency -- Pharyngeal -- Pharyngeal- Pill -- Pharyngeal -- Pharyngeal Comment chin tuck, head turn left with and without chin tuck did not clear residuals, cues to hock not effective to clear, effortful dry swallow inconsistently effective  CHL IP CERVICAL ESOPHAGEAL PHASE 04/04/2017 Cervical Esophageal Phase WFL Pudding Teaspoon -- Pudding Cup -- Honey Teaspoon -- Honey Cup -- Nectar Teaspoon -- Nectar Cup -- Nectar Straw -- Thin Teaspoon -- Thin Cup -- Thin Straw -- Puree -- Mechanical Soft -- Regular -- Multi-consistency -- Pill -- Cervical Esophageal Comment -- No flowsheet data found. Donavan Burnetamara Kimball, MS [facility] SLP [phone]  Ir Percutaneous Art Thrombectomy/infusion Intracranial Inc Diag Angio  Result Date: 04/01/2017 INDICATION: 76 year old male with a history of right MCA syndrome, with imaging workup revealing acute right ICA occlusion and right MCA occlusion (tandem occlusion), with increased NIH stroke scale, and aspects of 9. EXAM: ULTRASOUND GUIDED ACCESS RIGHT COMMON FEMORAL ARTERY. CEREBRAL ANGIOGRAM. REVASCULARIZATION OF OCCLUDED RIGHT INTERNAL CAROTID ARTERY AT THE BIFURCATION WITH BALLOON ANGIOPLASTY AND EMERGENT STENTING MECHANICAL THROMBECTOMY RIGHT MCA OCCLUSION DEPLOYMENT OF CLOSURE DEVICE FOR HEMOSTASIS COMPARISON:  CT 03/31/2017, CT angiogram 03/31/2017 MEDICATIONS: 2.0 g Ancef. The antibiotic was administered within 1 hour of the procedure ANESTHESIA/SEDATION: General anesthesia  CONTRAST:  180 cc Isovue FLUOROSCOPY TIME:  Fluoroscopy Time: 44 minutes 18 seconds (3,731 mGy). COMPLICATIONS: None TECHNIQUE: Emergent informed written consent was obtained after discussion with the patient who confirmed he has no family available and no friends to speak on his behalf. Angiogram and thrombectomy was performed as the standard of care for a patient presentation such as his, after a discussion with the patient regarding risks and benefits. He did acknowledge wishing to proceed to be helped. Specific risks discussed include: Bleeding, infection, contrast reaction, kidney injury/failure, need for further procedure/surgery, arterial injury or dissection, embolization to new territory, intracranial hemorrhage (10-15% risk), neurologic deterioration, cardiopulmonary collapse, death. Maximal Sterile Barrier Technique was utilized including caps, mask, sterile gowns, sterile gloves, sterile drape, hand hygiene and skin antiseptic. A timeout was performed prior to the initiation of the procedure. PROCEDURE: Maximal Sterile Barrier Technique was utilized including during the procedure including caps, mask, sterile gowns, sterile gloves, sterile drape, hand hygiene and skin antiseptic. A timeout was performed prior to the initiation of the procedure. Ultrasound survey of the right inguinal region was performed with images stored and sent to PACs. A micropuncture needle was used access the right common femoral artery under ultrasound. With excellent arterial blood flow returned, an .018 micro wire was passed through the needle, observed to enter the abdominal aorta under fluoroscopy. The needle was removed, and a micropuncture sheath was placed over the wire. The inner dilator and wire were removed, and an 035 Bentson wire was advanced under fluoroscopy into the abdominal aorta. The sheath was removed and a standard 5 [place] vascular sheath was placed. The dilator was removed and the sheath was flushed. A 80F  JB-1 diagnostic catheter was advanced over the wire to the proximal descending thoracic aorta. Wire was then removed. Double flush of the catheter was performed. Catheter was then used to select the innominate artery. Angiogram was performed. Catheter was then advanced into the right common carotid artery. Formal angiogram was performed. Exchange length Rosen wire was then passed through the diagnostic catheter to the distal common carotid artery and the diagnostic catheter was removed. The 5 French sheath was removed and exchanged for 8 French 55 centimeter BrightTip sheath. Sheath was flushed and attached to pressurized and heparinized saline bag for constant forward flow. Then a NeuroMax 80cm catheter was then advanced over the wire, positioned into the distal common carotid artery. Copious back flush was performed and the balloon catheter was attached to heparinized and pressurized saline bag for forward flow. . With the tip of the neuro on max position in the distal common carotid artery, micro wire and microcatheter were selected in attempt to cross the lesion at the bifurcation. A soft tipped Transcend wire and rapid transit microcatheter were selected. After the wire was navigated across the lesion, the microcatheter encountered significant resistance. The tension in the system caused the  neuro on max catheter to buckle towards the aortic arch. A tandem wire using a roadrunner 035 wire was then placed adjacent to the microcatheter system with the tip of the roadrunner into the external carotid artery. With the buddy wire in place, the microcatheter was pushed across the lesion in the cervical carotid. After successfully crossing the lesion at the bifurcation, the wire and microcatheter were position in the distal cervical segment of the ICA. The wire was removed and contrast was infused to confirm intraluminal location. Exchange length Transcend wire was then placed through the microcatheter terminating at  the skullbase. The microcatheter was removed, and a 4 mm by 30 mm rapid exchange system Viatrak angioplasty balloon was selected for the initial dilation. Buddy wire remained in place for pushing the balloon across the lesion. Once the balloon was position at the lesion. Inflation to a full 8 atmospheric and nominal diameter was accomplished. A second balloon angioplasty was performed. Balloon was removed from the micro wire, and the roadrunner wire was removed. Angiogram was performed. Given the appearance of the angioplasty site and poor antegrade flow through the cervical carotid, we elected to place a stent prior to thrombectomy of the MCA. A tapered Abbott Xact 26mm/6mm x 40mm carotid stent was selected and deployed at the lesion. Repeat angiogram demonstrated improved flow with evidence of dissection continuing above the stented segment. Given there was adequate antegrade flow, we elected to proceed with mechanical thrombectomy. In intermediate CAT 6 catheter was then passed over the Transcend wire to the skullbase, with copious backflow performed. The hub of the rotating hemostatic valve was attached to pressurized heparinized saline back. Pro view 18 catheter was then selected and advanced over the exchange wire to the skullbase. The exchange wire was then removed and synchro soft micro wire was selected to navigate into the intracranial ICA. Angiogram through the intermediate catheter was performed at the skullbase, and the microcatheter and micro wire were advanced across the proximal M1 occlusion under roadmap angiogram. While the microcatheter was being advanced through the occlusion over the wire, the intermediate catheter was observed to travel retrograde in the cervical carotid. At this point, we recognized that the neuro on max catheter had withdrawn into the aortic arch, and the entire system needed to be reduced in order to remove the redundancy of the telescoping system. Once the redundancy was  removed, NeuronMax was positioned again in the distal common carotid artery, intermediate catheter at the skullbase, and the microcatheter beyond the M1 occlusion, the synchro soft was removed and small injection through the microcatheter confirmed intraluminal location within insular branch location. A rotating hemostatic valve was then attached to the back end of the microcatheter, and a pressurized and heparinized saline bag was attached to the catheter. 4 x 40 solitaire device was then selected. Back flush was achieved at the rotating hemostatic valve, and then the device was gently advanced through the microcatheter to the distal end. The retriever was then unsheathed by withdrawing the microcatheter under fluoroscopy. Once the retriever was completely unsheathed, control angiogram was performed from the intermediate catheter. 3 minute time clock was observed. Constant aspiration was then performed at the tip of the intermediate catheter as the retriever was gently and slowly withdrawn with fluoroscopic observation. Once the retriever was entirely removed from the system, free aspiration was confirmed at the hub of the intermediate catheter, with free blood return confirmed. Control angiogram was then performed.  TICI 2b Second pass was then attempted. Microcatheter in the  micro wire were then advanced through the intermediate catheter, navigated into the insular branches of the right MCA territory under roadmap angiogram. Once the microcatheter was passed distally into the selected branch, the wire was removed and aspiration at the hub of the macro catheter was performed to confirm return of blood. Gentle contrast injection confirmed intraluminal location within the selected insular branch. The same 4 x 40 solitaire device was then used. Back flush was achieved at the rotating hemostatic valve on the microcatheter, and then the device was gently advanced through the microcatheter to the distal end. The  retriever was then unsheathed by withdrawing the microcatheter under fluoroscopy. Once the retriever was completely unsheathed, control angiogram was performed from the intermediate catheter. 3 minute time clock was again observed. Constant aspiration was then performed at the tip of the intermediate catheter as the retriever was gently and slowly withdrawn with fluoroscopic observation. Once the retriever was entirely removed from the system, free aspiration was confirmed at the hub of the intermediate catheter, with free blood return confirmed. Control angiogram was performed with TICI 3 flow. Control angiogram from the NeuronMax within the common carotid artery was performed, demonstrating occlusion at the stent site. 035 Rosen wire was advanced through the intermediate catheter to the skullbase, a rotating hemostatic valve was placed on the back end of the intermediate catheter, an the intermediate catheter was withdrawn slowly under fluoroscopic observation with gentle contrast injection to identified the distal site of the thrombus in the cervical segment of the internal carotid artery. This site was just beyond the stent at the bifurcation. Intermediate catheter was then advanced to the skullbase over the [employer], Rosen wire was removed. An the exchange length Transcend wire was placed into the internal carotid artery. Intermediate catheter was withdrawn. A non tapered Acculink carotid stent 6 mm x 30mm was selected to match the distal 6 mm diameter of the previously placed stent. This stent was deployed under fluoroscopic road map, with slight distal migration, with the proximal stent margin just beyond the distal stent margin of the first stent. Delivery device was removed an a control angiogram was performed demonstrating hemodynamically significant stenosis at the short interval between the stents. A third stent was then deployed at the interval, with an Abbott Xact, 7mm x 20mm stent selected. Delivery  device was removed. Control angiogram was performed at the cervical region confirming no flow limiting stenosis with excellent flow through the stent system. Control angiogram was performed of the intracranial vasculature demonstrating resolution of the flow limiting stenosis at the stent system with continued full reperfusion of the right hemisphere. NeuronMax was then removed. Diagnostic angiogram of the left carotid system was then performed using JB 1 catheter. All catheters wires were removed an and the 8 [place] by 55 cm bright tip sheath was exchanged for a short 8 [place] sheath. Control angiogram was performed at the right common femoral artery access site. Exoseal was deployed for hemostasis. Patient remained hemodynamically stable throughout. No complications were encountered and no significant blood loss encountered. FINDINGS: Ultrasound survey of the right inguinal region demonstrates widely patent common femoral artery with no significant plaque. Right common carotid artery: Initial angiogram of the right common carotid artery demonstrates normal course caliber and contour with no significant atherosclerotic changes. Right external carotid artery: Patent with antegrade flow. Right internal carotid artery: Occlusion of the right internal carotid artery just beyond the bifurcation with no string sign. Angiogram after balloon angioplasty of the carotid occlusion demonstrates hemodynamically  significant stenosis with irregular lumen and spiral Ing filling defect extending from the proximal ICA nearly to the skullbase. There is associated luminal thrombus in the distal cervical segment. After the initial balloon angioplasty, there is tubular tubular and spiraling filling defect of the cavernous segment extending through the supraclinoid segment to the carotid terminus. After treatment of the tandem occlusion and placement of stent system at the bifurcation extending into the right cervical ICA, there is  adequate treatment of presumed dissection at the occlusion, as well as the associated luminal thrombus, with no hemodynamically significant stenosis and excellent flow through the stent system. After mechanical thrombectomy, the distal cervical ICA and the intracranial ICA to the carotid terminus demonstrate resolution of filling defect, which was presumed thrombus. Right MCA: Initial angiogram demonstrates proximal M1 occlusion with no flow. The distal MCA branches cross fill through anterior cerebral circulation and leptomeningeal collaterals. After the first pass with solitaire 4 x 40 and local aspiration there is TICI 2b flow established. Residual filling defect at the distal M1 extending into M2 branches, as well as residual filling defect in the carotid siphon. After the second pass with solitaire 4 x 40 and local aspiration there is complete restoration of flow within the right MCA compatible with TICI 3 flow. No residual filling defect in the carotid siphon. Before the stent system was completed at the carotid bifurcation, the intracranial injection demonstrated no flow within the right A1 segment, which was likely related to competing cross perfusion from the left. After stent system was completed at the carotid bifurcation, intracranial control angiogram demonstrates antegrade flow through the carotid siphon, right MCA, right ACA, with maintained TICI 3 flow. Right ACA: Initial injection demonstrates patency of the right A1 segment, with perfusion of the right ACA territory. There is cross filling through patent anterior communicating artery, with some perfusion of the left ACA territory. Before the completion of the right carotid stent system, hemodynamically significant stenosis precluded adequate filling of the A1 segment secondary to competing cross flow from the left. After completion of the stent system, this flow phenomenon resolved, with complete filling of the right A1 and A2 branches. Left common  carotid artery:  Normal course caliber and contour. Left ICA: Normal course caliber and contour with no significant atherosclerotic changes. Patent 81 segment and anterior communicating artery with cross flow to the right. No filling defect or intracranial stenosis. Left external carotid artery: Unremarkable course caliber and contour and remains patent. Right common femoral artery: Adequate puncture site of the common femoral artery. 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 Deployment of Exoseal for hemostasis. Signed, Yvone Neu. Loreta Ave DO [facility] Radiology PLAN: Patient is stable to CT department for head CT Admit to neuro ICU. First 24 hours target systolic blood pressure of 120-140. Right hip straight for 6 hours status post removal of 8 French sheath. Electronically Signed   By: Gilmer Mor D.O.   On: 04/01/2017 11:23   Ct Head Code Stroke W/o Cm  Result Date: 03/31/2017 CLINICAL DATA:  Code stroke. 76 year old male with left side weakness. EXAM: CT HEAD WITHOUT CONTRAST TECHNIQUE: Contiguous axial images were obtained from the base of the skull through the vertex without intravenous contrast. COMPARISON:  Head CT  without contrast 12/13/2009 FINDINGS: Brain: Hypodensity at the level of the right insula and external capsule (series 3, image 17). No acute intracranial hemorrhage identified. No midline shift, mass effect, or evidence of intracranial mass lesion. Elsewhere gray-white matter differentiation appears stable since 2011. No ventriculomegaly. Vascular: Hyperdense distal right MCA M1 segment. Skull: No acute osseous abnormality identified. Sinuses/Orbits: Chronic paranasal sinus mucosal  thickening, mildly progressed. Tympanic cavities and mastoids are clear. Other: No acute orbit or scalp soft tissue findings. ASPECTS [facility] Stroke Program Early CT Score) - Ganglionic level infarction (caudate, lentiform nuclei, internal capsule, insula, M1-M3 cortex): 6 (minus 1 for right insula) - Supraganglionic infarction (M4-M6 cortex): 3 Total score (0-10 with 10 being normal): 9 IMPRESSION: 1. Emergent right MCA large vessel occlusion suspected. No associated hemorrhage or mass effect. 2. ASPECTS is 9. 3. The above was relayed via text pager to Dr. Ritta Slot 1052 hours. Electronically Signed   By: Odessa Fleming M.D.   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 EXAM: ULTRASOUND GUIDED ACCESS RIGHT COMMON FEMORAL ARTERY. CEREBRAL ANGIOGRAM. REVASCULARIZATION OF OCCLUDED RIGHT INTERNAL CAROTID ARTERY AT THE BIFURCATION WITH BALLOON ANGIOPLASTY AND EMERGENT STENTING MECHANICAL THROMBECTOMY RIGHT MCA OCCLUSION DEPLOYMENT OF CLOSURE DEVICE FOR HEMOSTASIS COMPARISON:  CT 03/31/2017, CT angiogram 03/31/2017 MEDICATIONS: 2.0 g Ancef. The antibiotic was administered within 1 hour of the procedure ANESTHESIA/SEDATION: General anesthesia CONTRAST:  180 cc Isovue FLUOROSCOPY TIME:  Fluoroscopy Time: 44 minutes 18 seconds (3,731 mGy). COMPLICATIONS: None TECHNIQUE: Emergent informed written consent was obtained after discussion with the patient who confirmed he has no family available and no friends to speak on his behalf. Angiogram and thrombectomy was performed as the standard of care for a patient presentation such as his, after a discussion with the patient regarding risks and benefits. He did acknowledge wishing to proceed  to be helped. Specific risks discussed include: Bleeding, infection, contrast reaction, kidney injury/failure, need for further procedure/surgery, arterial injury or dissection, embolization to new territory, intracranial hemorrhage (10-15% risk), neurologic deterioration, cardiopulmonary collapse, death. Maximal Sterile Barrier Technique was utilized including caps, mask, sterile gowns, sterile gloves, sterile drape, hand hygiene and skin antiseptic. A timeout was performed prior to the initiation of the procedure. PROCEDURE: Maximal Sterile Barrier Technique was utilized including during the procedure including caps, mask, sterile gowns, sterile gloves, sterile drape, hand hygiene and skin antiseptic. A timeout was performed prior to the initiation of the procedure. Ultrasound survey of the right inguinal region was performed with images stored and sent to PACs. A micropuncture needle was used access the right common femoral artery under ultrasound. With excellent arterial blood flow returned, an .018 micro wire was passed through the needle, observed to enter the abdominal aorta under fluoroscopy. The needle was removed, and a micropuncture sheath was placed over the wire. The inner dilator and wire were removed, and an 035 Bentson wire was advanced under fluoroscopy into the abdominal aorta. The sheath was removed and a standard 5 [place] vascular sheath was placed. The dilator was removed and the sheath was flushed. A 54F JB-1 diagnostic catheter was advanced over the wire to the proximal descending thoracic aorta. Wire was then removed. Double flush of the catheter was performed. Catheter was then used to  select the innominate artery. Angiogram was performed. Catheter was then advanced into the right common carotid artery. Formal angiogram was performed. Exchange length Rosen wire was then passed through the diagnostic catheter to the distal common carotid artery and the diagnostic catheter was removed. The 5  French sheath was removed and exchanged for 8 French 55 centimeter BrightTip sheath. Sheath was flushed and attached to pressurized and heparinized saline bag for constant forward flow. Then a NeuroMax 80cm catheter was then advanced over the wire, positioned into the distal common carotid artery. Copious back flush was performed and the balloon catheter was attached to heparinized and pressurized saline bag for forward flow. . With the tip of the neuro on max position in the distal common carotid artery, micro wire and microcatheter were selected in attempt to cross the lesion at the bifurcation. A soft tipped Transcend wire and rapid transit microcatheter were selected. After the wire was navigated across the lesion, the microcatheter encountered significant resistance. The tension in the system caused the neuro on max catheter to buckle towards the aortic arch. A tandem wire using a roadrunner 035 wire was then placed adjacent to the microcatheter system with the tip of the roadrunner into the external carotid artery. With the buddy wire in place, the microcatheter was pushed across the lesion in the cervical carotid. After successfully crossing the lesion at the bifurcation, the wire and microcatheter were position in the distal cervical segment of the ICA. The wire was removed and contrast was infused to confirm intraluminal location. Exchange length Transcend wire was then placed through the microcatheter terminating at the skullbase. The microcatheter was removed, and a 4 mm by 30 mm rapid exchange system Viatrak angioplasty balloon was selected for the initial dilation. Buddy wire remained in place for pushing the balloon across the lesion. Once the balloon was position at the lesion. Inflation to a full 8 atmospheric and nominal diameter was accomplished. A second balloon angioplasty was performed. Balloon was removed from the micro wire, and the roadrunner wire was removed. Angiogram was performed. Given  the appearance of the angioplasty site and poor antegrade flow through the cervical carotid, we elected to place a stent prior to thrombectomy of the MCA. A tapered Abbott Xact 53mm/6mm x 40mm carotid stent was selected and deployed at the lesion. Repeat angiogram demonstrated improved flow with evidence of dissection continuing above the stented segment. Given there was adequate antegrade flow, we elected to proceed with mechanical thrombectomy. In intermediate CAT 6 catheter was then passed over the Transcend wire to the skullbase, with copious backflow performed. The hub of the rotating hemostatic valve was attached to pressurized heparinized saline back. Pro view 18 catheter was then selected and advanced over the exchange wire to the skullbase. The exchange wire was then removed and synchro soft micro wire was selected to navigate into the intracranial ICA. Angiogram through the intermediate catheter was performed at the skullbase, and the microcatheter and micro wire were advanced across the proximal M1 occlusion under roadmap angiogram. While the microcatheter was being advanced through the occlusion over the wire, the intermediate catheter was observed to travel retrograde in the cervical carotid. At this point, we recognized that the neuro on max catheter had withdrawn into the aortic arch, and the entire system needed to be reduced in order to remove the redundancy of the telescoping system. Once the redundancy was removed, NeuronMax was positioned again in the distal common carotid artery, intermediate catheter at the skullbase, and the microcatheter  beyond the M1 occlusion, the synchro soft was removed and small injection through the microcatheter confirmed intraluminal location within insular branch location. A rotating hemostatic valve was then attached to the back end of the microcatheter, and a pressurized and heparinized saline bag was attached to the catheter. 4 x 40 solitaire device was then  selected. Back flush was achieved at the rotating hemostatic valve, and then the device was gently advanced through the microcatheter to the distal end. The retriever was then unsheathed by withdrawing the microcatheter under fluoroscopy. Once the retriever was completely unsheathed, control angiogram was performed from the intermediate catheter. 3 minute time clock was observed. Constant aspiration was then performed at the tip of the intermediate catheter as the retriever was gently and slowly withdrawn with fluoroscopic observation. Once the retriever was entirely removed from the system, free aspiration was confirmed at the hub of the intermediate catheter, with free blood return confirmed. Control angiogram was then performed.  TICI 2b Second pass was then attempted. Microcatheter in the micro wire were then advanced through the intermediate catheter, navigated into the insular branches of the right MCA territory under roadmap angiogram. Once the microcatheter was passed distally into the selected branch, the wire was removed and aspiration at the hub of the macro catheter was performed to confirm return of blood. Gentle contrast injection confirmed intraluminal location within the selected insular branch. The same 4 x 40 solitaire device was then used. Back flush was achieved at the rotating hemostatic valve on the microcatheter, and then the device was gently advanced through the microcatheter to the distal end. The retriever was then unsheathed by withdrawing the microcatheter under fluoroscopy. Once the retriever was completely unsheathed, control angiogram was performed from the intermediate catheter. 3 minute time clock was again observed. Constant aspiration was then performed at the tip of the intermediate catheter as the retriever was gently and slowly withdrawn with fluoroscopic observation. Once the retriever was entirely removed from the system, free aspiration was confirmed at the hub of the  intermediate catheter, with free blood return confirmed. Control angiogram was performed with TICI 3 flow. Control angiogram from the NeuronMax within the common carotid artery was performed, demonstrating occlusion at the stent site. 035 Rosen wire was advanced through the intermediate catheter to the skullbase, a rotating hemostatic valve was placed on the back end of the intermediate catheter, an the intermediate catheter was withdrawn slowly under fluoroscopic observation with gentle contrast injection to identified the distal site of the thrombus in the cervical segment of the internal carotid artery. This site was just beyond the stent at the bifurcation. Intermediate catheter was then advanced to the skullbase over the [employer], Rosen wire was removed. An the exchange length Transcend wire was placed into the internal carotid artery. Intermediate catheter was withdrawn. A non tapered Acculink carotid stent 6 mm x 30mm was selected to match the distal 6 mm diameter of the previously placed stent. This stent was deployed under fluoroscopic road map, with slight distal migration, with the proximal stent margin just beyond the distal stent margin of the first stent. Delivery device was removed an a control angiogram was performed demonstrating hemodynamically significant stenosis at the short interval between the stents. A third stent was then deployed at the interval, with an Abbott Xact, 7mm x 20mm stent selected. Delivery device was removed. Control angiogram was performed at the cervical region confirming no flow limiting stenosis with excellent flow through the stent system. Control angiogram was performed of  the intracranial vasculature demonstrating resolution of the flow limiting stenosis at the stent system with continued full reperfusion of the right hemisphere. NeuronMax was then removed. Diagnostic angiogram of the left carotid system was then performed using JB 1 catheter. All catheters wires were  removed an and the 8 [place] by 55 cm bright tip sheath was exchanged for a short 8 [place] sheath. Control angiogram was performed at the right common femoral artery access site. Exoseal was deployed for hemostasis. Patient remained hemodynamically stable throughout. No complications were encountered and no significant blood loss encountered. FINDINGS: Ultrasound survey of the right inguinal region demonstrates widely patent common femoral artery with no significant plaque. Right common carotid artery: Initial angiogram of the right common carotid artery demonstrates normal course caliber and contour with no significant atherosclerotic changes. Right external carotid artery: Patent with antegrade flow. Right internal carotid artery: Occlusion of the right internal carotid artery just beyond the bifurcation with no string sign. Angiogram after balloon angioplasty of the carotid occlusion demonstrates hemodynamically significant stenosis with irregular lumen and spiral Ing filling defect extending from the proximal ICA nearly to the skullbase. There is associated luminal thrombus in the distal cervical segment. After the initial balloon angioplasty, there is tubular tubular and spiraling filling defect of the cavernous segment extending through the supraclinoid segment to the carotid terminus. After treatment of the tandem occlusion and placement of stent system at the bifurcation extending into the right cervical ICA, there is adequate treatment of presumed dissection at the occlusion, as well as the associated luminal thrombus, with no hemodynamically significant stenosis and excellent flow through the stent system. After mechanical thrombectomy, the distal cervical ICA and the intracranial ICA to the carotid terminus demonstrate resolution of filling defect, which was presumed thrombus. Right MCA: Initial angiogram demonstrates proximal M1 occlusion with no flow. The distal MCA branches cross fill through anterior  cerebral circulation and leptomeningeal collaterals. After the first pass with solitaire 4 x 40 and local aspiration there is TICI 2b flow established. Residual filling defect at the distal M1 extending into M2 branches, as well as residual filling defect in the carotid siphon. After the second pass with solitaire 4 x 40 and local aspiration there is complete restoration of flow within the right MCA compatible with TICI 3 flow. No residual filling defect in the carotid siphon. Before the stent system was completed at the carotid bifurcation, the intracranial injection demonstrated no flow within the right A1 segment, which was likely related to competing cross perfusion from the left. After stent system was completed at the carotid bifurcation, intracranial control angiogram demonstrates antegrade flow through the carotid siphon, right MCA, right ACA, with maintained TICI 3 flow. Right ACA: Initial injection demonstrates patency of the right A1 segment, with perfusion of the right ACA territory. There is cross filling through patent anterior communicating artery, with some perfusion of the left ACA territory. Before the completion of the right carotid stent system, hemodynamically significant stenosis precluded adequate filling of the A1 segment secondary to competing cross flow from the left. After completion of the stent system, this flow phenomenon resolved, with complete filling of the right A1 and A2 branches. Left common carotid artery:  Normal course caliber and contour. Left ICA: Normal course caliber and contour with no significant atherosclerotic changes. Patent 81 segment and anterior communicating artery with cross flow to the right. No filling defect or intracranial stenosis. Left external carotid artery: Unremarkable course caliber and contour and remains patent. Right common femoral  artery: Adequate puncture site of the common femoral artery. IMPRESSION: Status post cerebral angiogram and treatment of  tandem occlusion of the right ICA and right M1, with emergent stent system of the right ICA for treatment of dissection and intraluminal thrombus, and mechanical thrombectomy of the right MCA and carotid siphon, with restoration of TICI 3 flow. Deployment of Exoseal for hemostasis. Signed, Yvone Neu. Loreta Ave DO [facility] Radiology PLAN: Patient is stable to CT department for head CT Admit to neuro ICU. First 24 hours target systolic blood pressure of 120-140. Right hip straight for 6 hours status post removal of 8 French sheath. Electronically Signed   By: Gilmer Mor D.O.   On: 04/01/2017 11:23    Assessment/Plan  Generalized weakness Will have patient work with PT/OT as tolerated to regain strength and restore function.  Fall precautions are in place.  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 Will continue plavix 75 mg daily, aspirin 325 mg daily and lipitor 40 mg daily. Pending cardiology f/u for cardiac event monitor to rule out arrhythmia. Continue amantadine 200 mg bid to help with his alertness. Will need neurology follow up. To work with PT, OT, SLP.   Right ICA stenosis s/p stent placement. f/u with neurology. Continue aspirin, plavix and statin  Left hemiplegia Residual effect of CVA. Will have him work with physical therapy and occupational therapy team to help with gait training and muscle strengthening exercises.fall precautions. Skin care. Encourage to be out of bed. Continue baclofen 10 mg tid. PMR consult.   Dysphagia s/p peg tube. Continue tube feeding. Aspiration precautions. SLP and RD to follow  Leukocytosis With his UTI, monitor cbc with diff  Proteus UTI Urine culture has resulted. Currently pn augmentin bid until 04/18/17. Reviewed sensitivity report. Discontinue augmentin. Start ampicillin 500 mg qid x 1 week and monitor. Perineal hygiene to be maintained  HTN Not on any antihypertensives. Monitor BP tid for now.    Hyperlipidemia Continue lipitor 40 mg daily  Prediabetes Lab Results  Component Value Date   HGBA1C 6.2 (H) 04/01/2017   Continue tube feed with novolog 8 u q4h for now and monitor. To hold novolog if tube feed held or discontinued.   gerd Continue famotidine  ckd stage 2 Monitor bmp   Goals of care: short term rehabilitation   Labs/tests ordered: cbc, cmp 04/17/17  Family/ staff Communication: reviewed care plan with patient and nursing supervisor    Oneal Grout, MD Internal Medicine [facility] [address] [place], [place] [address] Cell Phone (Monday-Friday 8 am - 5 pm): [phone] On Call: [phone] and follow prompts after 5 pm and on weekends Office Phone: [phone] Office Fax: [phone]

## 2017-05-31 ENCOUNTER — Ambulatory Visit (INDEPENDENT_AMBULATORY_CARE_PROVIDER_SITE_OTHER): Payer: Medicare HMO | Admitting: Diagnostic Neuroimaging

## 2017-05-31 ENCOUNTER — Encounter: Payer: Self-pay | Admitting: Diagnostic Neuroimaging

## 2017-05-31 VITALS — BP 136/88 | HR 117 | Ht 68.0 in | Wt 187.0 lb

## 2017-05-31 DIAGNOSIS — R7303 Prediabetes: Secondary | ICD-10-CM | POA: Diagnosis not present

## 2017-05-31 DIAGNOSIS — I10 Essential (primary) hypertension: Secondary | ICD-10-CM | POA: Diagnosis not present

## 2017-05-31 DIAGNOSIS — I63511 Cerebral infarction due to unspecified occlusion or stenosis of right middle cerebral artery: Secondary | ICD-10-CM | POA: Diagnosis not present

## 2017-05-31 DIAGNOSIS — I6521 Occlusion and stenosis of right carotid artery: Secondary | ICD-10-CM | POA: Diagnosis not present

## 2017-05-31 NOTE — Progress Notes (Signed)
[facility]  PATIENT: Matthew Meza DOB: August 02, 1941  REFERRING CLINICIAN: Roda Shutters / stroke team / hospital HISTORY FROM: patient  REASON FOR VISIT: new consult    HISTORICAL  CHIEF COMPLAINT:  Chief Complaint  Patient presents with  . Cerebrovascular Accident    rm 7, New Pt, hospital FU, residing at [facility], "unable to use left side"    HISTORY OF PRESENT ILLNESS:   NEW HPI (05/31/17): 76 year old male here for possible stroke discharge follow-up, status post right MCA stroke due to right ICA occlusion, status post intervention right ICA stent. Patient now living at [facility] rehabilitation. Patient still has significant deficits on left side. No recurrent neurologic symptoms. Patient tolerating medications. No other concerns at this time.  PRIOR HPI (Dr. Roda Shutters, 04/12/17): "76 y.o.malewith no significant past medical historypresenting with left hemiparesis.Hedidnot receive IV t-PA due to late presentation. Status post IR thrombectomy and R ICA rescue stenting"   REVIEW OF SYSTEMS: Full 14 system review of systems performed and negative with exception of: none.  ALLERGIES: Allergies  Allergen Reactions  . No Known Allergies     HOME MEDICATIONS: Outpatient Medications Prior to Visit  Medication Sig Dispense Refill  . aspirin 325 MG tablet Place 1 tablet (325 mg total) into feeding tube daily.    Marland Kitchen atorvastatin (LIPITOR) 40 MG tablet Place 1 tablet (40 mg total) into feeding tube daily at 6 PM.    . baclofen (LIORESAL) 10 mg/mL SUSP Take 1 mL (10 mg total) by mouth 3 (three) times daily.    . clopidogrel (PLAVIX) 75 MG tablet Place 1 tablet (75 mg total) into feeding tube daily.    . famotidine (PEPCID) 20 MG tablet Place 1 tablet (20 mg total) into feeding tube 2 (two) times daily.    . insulin aspart (NOVOLOG) 100 UNIT/ML injection Inject 8 Units into the skin every 4 (four) hours. 10 mL 11  . ipratropium-albuterol (DUONEB) 0.5-2.5 (3)  MG/3ML SOLN Take 3 mLs by nebulization every 4 (four) hours as needed. 360 mL   . ipratropium-albuterol (DUONEB) 0.5-2.5 (3) MG/3ML SOLN Take 3 mLs by nebulization 3 (three) times daily. 360 mL   . Nutritional Supplements (FEEDING SUPPLEMENT, JEVITY 1.2 CAL,) LIQD Place 1,000 mLs into feeding tube continuous.  0  . amantadine (SYMMETREL) 50 MG/5ML solution Place 20 mLs (200 mg total) into feeding tube 2 (two) times daily. 140 mL 0  . insulin aspart (NOVOLOG) 100 UNIT/ML injection Inject 0-15 Units into the skin every 4 (four) hours. (Patient not taking: Reported on 05/31/2017) 10 mL 11   No facility-administered medications prior to visit.     PAST MEDICAL HISTORY: Past Medical History:  Diagnosis Date  . Diabetes mellitus without complication (HCC)    type 2  . GERD (gastroesophageal reflux disease)   . Hyperlipidemia   . Hypertension   . No pertinent past medical history   . Stroke (HCC) 04/12/2017    PAST SURGICAL HISTORY: Past Surgical History:  Procedure Laterality Date  . APPENDECTOMY    . ESOPHAGOGASTRODUODENOSCOPY N/A 04/07/2017   Procedure: ESOPHAGOGASTRODUODENOSCOPY (EGD);  Surgeon: Violeta Gelinas, MD;  Location: [facility] ENDOSCOPY;  Service: Endoscopy;  Laterality: N/A;  . IR ANGIO INTRA EXTRACRAN SEL COM CAROTID INNOMINATE UNI L MOD SED  03/31/2017  . IR INTRAVSC STENT CERV CAROTID W/O EMB-PROT MOD SED INC ANGIO  03/31/2017  . IR PERCUTANEOUS ART THROMBECTOMY/INFUSION INTRACRANIAL INC DIAG ANGIO  03/31/2017  . IR US GUIDE VASC ACCESS RIGHT  03/31/2017  .  LAPAROSCOPIC APPENDECTOMY  06/21/2012   Procedure: APPENDECTOMY LAPAROSCOPIC;  Surgeon: Kandis Cockingavid H Newman, MD;  Location: [facility] ORS;  Service: General;  Laterality: N/A;  . NO PAST SURGERIES    . PEG PLACEMENT N/A 04/07/2017   Procedure: PERCUTANEOUS ENDOSCOPIC GASTROSTOMY (PEG) PLACEMENT;  Surgeon: Violeta Gelinashompson, Burke, MD;  [facility];  Service: Endoscopy;  Laterality: N/A;  . RADIOLOGY WITH ANESTHESIA N/A 03/31/2017   Procedure:  RADIOLOGY WITH ANESTHESIA;  Surgeon: Gilmer MorJaime Wagner, DO;  Location: [facility] OR;  Service: Anesthesiology;  Laterality: N/A;    FAMILY HISTORY: Family History  Problem Relation Age of Onset  . Stroke Mother     SOCIAL HISTORY:  Social History   Social History  . Marital status: Widowed    Spouse name: N/A  . Number of children: 1  . Years of education: GED   Occupational History  .      retired   Social History Main Topics  . Smoking status: Former Smoker    Years: 20.00    Quit date: 06/22/1995  . Smokeless tobacco: Never Used  . Alcohol use No  . Drug use: No  . Sexual activity: Not on file   Other Topics Concern  . Not on file   Social History Narrative   [facility]31/17 [facility]     PHYSICAL EXAM  GENERAL EXAM/CONSTITUTIONAL: Vitals:  Vitals:   05/31/17 1044  BP: 136/88  Pulse: (!) 117  Weight: 187 lb (84.8 kg)  Height: 5\' 8"  (1.727 m)     Body mass index is 28.43 kg/m.  Vision Screening Comments: 05/31/17 unable to stand  Patient is in no distress; well developed, nourished and groomed; neck is supple  CARDIOVASCULAR:  Examination of carotid arteries is normal; no carotid bruits  Regular rate and rhythm, no murmurs  Examination of peripheral vascular system by observation and palpation is normal  EYES:  Ophthalmoscopic exam of optic discs and posterior segments is normal; no papilledema or hemorrhages  MUSCULOSKELETAL:  Gait, strength, tone, movements noted in Neurologic exam below  NEUROLOGIC: MENTAL STATUS:  No flowsheet data found.  awake, alert, oriented to person, place and time  recent and remote memory intact  normal attention and concentration  language fluent, comprehension intact, naming intact,   fund of knowledge appropriate  CRANIAL NERVE:   2nd - no papilledema on fundoscopic exam  2nd, 3rd, 4th, 6th - pupils equal and reactive to light, visual fields full to confrontation, extraocular muscles intact,  no nystagmus  5th - facial sensation symmetric  7th - facial strength --> DECR LEFT NL FOLD AND LOWER FACIAL STRENGTH  8th - hearing intact  9th - palate elevates symmetrically, uvula midline  11th - shoulder shrug symmetric  12th - tongue protrusion midline  HOARSE VOICE  MILD DROOLING FROM LEFT SIDE  MOTOR:   normal bulk and tone, full strength in the RUE, RLE  DENSE LEFT HEMIPLEGIA WITH INCREASED TONE  SENSORY:   normal and symmetric to light touch, temperature, vibration  INTACT ON LEFT SIDE BUT SLIGHT REDUCED  COORDINATION:   finger-nose-finger, fine finger movements NORMAL ON RIGHT  REFLEXES:   deep tendon reflexes BRISK IN LEFT ARM AND LEG  GAIT/STATION:   IN WHEELCHAIR; CANNOT STAND    DIAGNOSTIC DATA (LABS, IMAGING, TESTING) - I reviewed patient records, labs, notes, testing and imaging myself where available.  Lab Results  Component Value Date   WBC 14.8 (H) 04/12/2017   HGB 11.8 (L) 04/12/2017   HCT 35.4 (L)  04/12/2017   MCV 90.5 04/12/2017   PLT 236 04/12/2017      Component Value Date/Time   NA 135 04/12/2017 0417   K 3.8 04/12/2017 0417   CL 100 (L) 04/12/2017 0417   CO2 26 04/12/2017 0417   GLUCOSE 246 (H) 04/12/2017 0417   BUN 18 04/12/2017 0417   CREATININE 1.17 04/12/2017 0417   CREATININE 1.16 08/10/2016 1324   CALCIUM 8.2 (L) 04/12/2017 0417   PROT 6.8 03/31/2017 1040   ALBUMIN 4.0 03/31/2017 1040   AST 31 03/31/2017 1040   ALT 29 03/31/2017 1040   ALKPHOS 61 03/31/2017 1040   BILITOT 0.9 03/31/2017 1040   GFRNONAA 59 (L) 04/12/2017 0417   GFRAA >60 04/12/2017 0417   Lab Results  Component Value Date   CHOL 148 04/01/2017   HDL 27 (L) 04/01/2017   LDLCALC 99 04/01/2017   TRIG 109 04/01/2017   CHOLHDL 5.5 04/01/2017   Lab Results  Component Value Date   HGBA1C 6.2 (H) 04/01/2017   Lab Results  Component Value Date   VITAMINB12 461 04/02/2015   Lab Results  Component Value Date   TSH 4.113 04/02/2015     03/31/17 CT head [I reviewed images myself and agree with interpretation. -VRP]  1. Emergent right MCA large vessel occlusion suspected. No associated hemorrhage or mass effect. 2. ASPECTS is 9. 3. The above was relayed via text pager to Dr. Ritta Slot 1052 hours.  04/01/17 MRI brain [I reviewed images myself and agree with interpretation. -VRP]  - Acute large RIGHT MCA territory infarct with similar petechial hemorrhage. - Otherwise negative noncontrast MRI head for age.  04/02/17 TTE - Normal LV function; trace MR and TR; mildly elevated pulmonary pressure.    ASSESSMENT AND PLAN  76 y.o. year old male here with:   Dx: Thromboembolic stroke [facility]) -  right MCA (April 2018) due to right ICA occlusion s/p intervention and right ICA stent  1. Cerebrovascular accident (CVA) due to occlusion of right middle cerebral artery (HCC)   2. Prediabetes   3. Essential hypertension   4. ICAO (internal carotid artery occlusion), right      PLAN: - setup cardiac event monitor - continue aspirin + plavix - continue statin and diabetes control - continue therapy evaluations  Orders Placed This Encounter  Procedures  . Cardiac event monitor   Return in about 6 months (around 11/30/2017) for with Dr. Roda Shutters (stroke clinic).    Suanne Marker, MD 05/31/2017, 11:18 AM Certified in Neurology, Neurophysiology and Neuroimaging  [facility] [address], [address] [place], [place] [address] [phone]

## 2017-06-02 DIAGNOSIS — R4182 Altered mental status, unspecified: Secondary | ICD-10-CM | POA: Diagnosis not present

## 2017-06-03 ENCOUNTER — Emergency Department (HOSPITAL_COMMUNITY): Payer: Medicare HMO

## 2017-06-03 ENCOUNTER — Inpatient Hospital Stay (HOSPITAL_COMMUNITY)
Admission: EM | Admit: 2017-06-03 | Discharge: 2017-06-06 | DRG: 871 | Disposition: A | Discharge status: Skilled Nursing Facility | Payer: Medicare HMO | Attending: Internal Medicine | Admitting: Internal Medicine

## 2017-06-03 ENCOUNTER — Encounter (HOSPITAL_COMMUNITY): Payer: Self-pay | Admitting: Emergency Medicine

## 2017-06-03 DIAGNOSIS — E785 Hyperlipidemia, unspecified: Secondary | ICD-10-CM | POA: Diagnosis present

## 2017-06-03 DIAGNOSIS — N3000 Acute cystitis without hematuria: Secondary | ICD-10-CM | POA: Diagnosis present

## 2017-06-03 DIAGNOSIS — I69354 Hemiplegia and hemiparesis following cerebral infarction affecting left non-dominant side: Secondary | ICD-10-CM | POA: Diagnosis not present

## 2017-06-03 DIAGNOSIS — Z7982 Long term (current) use of aspirin: Secondary | ICD-10-CM | POA: Diagnosis not present

## 2017-06-03 DIAGNOSIS — R7303 Prediabetes: Secondary | ICD-10-CM | POA: Diagnosis present

## 2017-06-03 DIAGNOSIS — Z79899 Other long term (current) drug therapy: Secondary | ICD-10-CM | POA: Diagnosis not present

## 2017-06-03 DIAGNOSIS — K219 Gastro-esophageal reflux disease without esophagitis: Secondary | ICD-10-CM | POA: Diagnosis present

## 2017-06-03 DIAGNOSIS — N390 Urinary tract infection, site not specified: Secondary | ICD-10-CM | POA: Diagnosis present

## 2017-06-03 DIAGNOSIS — R000 Tachycardia, unspecified: Secondary | ICD-10-CM | POA: Diagnosis not present

## 2017-06-03 DIAGNOSIS — A419 Sepsis, unspecified organism: Principal | ICD-10-CM | POA: Diagnosis present

## 2017-06-03 DIAGNOSIS — G92 Toxic encephalopathy: Secondary | ICD-10-CM | POA: Diagnosis present

## 2017-06-03 DIAGNOSIS — G20 Parkinson's disease: Secondary | ICD-10-CM | POA: Diagnosis present

## 2017-06-03 DIAGNOSIS — I69391 Dysphagia following cerebral infarction: Secondary | ICD-10-CM | POA: Diagnosis not present

## 2017-06-03 DIAGNOSIS — Z7902 Long term (current) use of antithrombotics/antiplatelets: Secondary | ICD-10-CM

## 2017-06-03 DIAGNOSIS — Z794 Long term (current) use of insulin: Secondary | ICD-10-CM

## 2017-06-03 DIAGNOSIS — E1122 Type 2 diabetes mellitus with diabetic chronic kidney disease: Secondary | ICD-10-CM | POA: Diagnosis present

## 2017-06-03 DIAGNOSIS — Z823 Family history of stroke: Secondary | ICD-10-CM

## 2017-06-03 DIAGNOSIS — Z431 Encounter for attention to gastrostomy: Secondary | ICD-10-CM

## 2017-06-03 DIAGNOSIS — I10 Essential (primary) hypertension: Secondary | ICD-10-CM | POA: Diagnosis not present

## 2017-06-03 DIAGNOSIS — I639 Cerebral infarction, unspecified: Secondary | ICD-10-CM | POA: Diagnosis present

## 2017-06-03 DIAGNOSIS — I129 Hypertensive chronic kidney disease with stage 1 through stage 4 chronic kidney disease, or unspecified chronic kidney disease: Secondary | ICD-10-CM | POA: Diagnosis present

## 2017-06-03 DIAGNOSIS — N182 Chronic kidney disease, stage 2 (mild): Secondary | ICD-10-CM | POA: Diagnosis present

## 2017-06-03 DIAGNOSIS — Z87891 Personal history of nicotine dependence: Secondary | ICD-10-CM

## 2017-06-03 LAB — BRAIN NATRIURETIC PEPTIDE: B Natriuretic Peptide: 58.9 pg/mL (ref 0.0–100.0)

## 2017-06-03 LAB — CBC WITH DIFFERENTIAL/PLATELET
BASOS PCT: 0 %
Basophils Absolute: 0 10*3/uL (ref 0.0–0.1)
EOS ABS: 0 10*3/uL (ref 0.0–0.7)
Eosinophils Relative: 0 %
HEMATOCRIT: 42.6 % (ref 39.0–52.0)
Hemoglobin: 14.2 g/dL (ref 13.0–17.0)
Lymphocytes Relative: 5 %
Lymphs Abs: 0.8 10*3/uL (ref 0.7–4.0)
MCH: 30.4 pg (ref 26.0–34.0)
MCHC: 33.3 g/dL (ref 30.0–36.0)
MCV: 91.2 fL (ref 78.0–100.0)
MONO ABS: 1.1 10*3/uL — AB (ref 0.1–1.0)
MONOS PCT: 6 %
NEUTROS ABS: 15.6 10*3/uL — AB (ref 1.7–7.7)
Neutrophils Relative %: 89 %
Platelets: 155 10*3/uL (ref 150–400)
RBC: 4.67 MIL/uL (ref 4.22–5.81)
RDW: 12.8 % (ref 11.5–15.5)
WBC: 17.5 10*3/uL — ABNORMAL HIGH (ref 4.0–10.5)

## 2017-06-03 LAB — COMPREHENSIVE METABOLIC PANEL
ALBUMIN: 2.8 g/dL — AB (ref 3.5–5.0)
ALK PHOS: 110 U/L (ref 38–126)
ALT: 28 U/L (ref 17–63)
AST: 26 U/L (ref 15–41)
Anion gap: 10 (ref 5–15)
BILIRUBIN TOTAL: 1.5 mg/dL — AB (ref 0.3–1.2)
BUN: 15 mg/dL (ref 6–20)
CALCIUM: 8.6 mg/dL — AB (ref 8.9–10.3)
CO2: 23 mmol/L (ref 22–32)
Chloride: 102 mmol/L (ref 101–111)
Creatinine, Ser: 1.03 mg/dL (ref 0.61–1.24)
GFR calc Af Amer: >60 mL/min (ref >60)
GFR calc non Af Amer: >60 mL/min (ref >60)
GLUCOSE: 172 mg/dL — AB (ref 65–99)
Potassium: 4.1 mmol/L (ref 3.5–5.1)
Sodium: 135 mmol/L (ref 135–145)
TOTAL PROTEIN: 6.8 g/dL (ref 6.5–8.1)

## 2017-06-03 LAB — URINALYSIS, ROUTINE W REFLEX MICROSCOPIC
Bilirubin Urine: NEGATIVE
GLUCOSE, UA: NEGATIVE mg/dL
Ketones, ur: 5 mg/dL — AB
Nitrite: NEGATIVE
PROTEIN: 100 mg/dL — AB
Specific Gravity, Urine: 1.019 (ref 1.005–1.030)
pH: 6 (ref 5.0–8.0)

## 2017-06-03 LAB — PROTIME-INR
INR: 1.17
Prothrombin Time: 15 seconds (ref 11.4–15.2)

## 2017-06-03 LAB — LACTIC ACID, PLASMA: Lactic Acid, Venous: 1.9 mmol/L (ref 0.5–1.9)

## 2017-06-03 LAB — I-STAT CG4 LACTIC ACID, ED
LACTIC ACID, VENOUS: 1.61 mmol/L (ref 0.5–1.9)
Lactic Acid, Venous: 1.93 mmol/L (ref 0.5–1.9)

## 2017-06-03 LAB — GLUCOSE, CAPILLARY: GLUCOSE-CAPILLARY: 143 mg/dL — AB (ref 65–99)

## 2017-06-03 MED ORDER — DEXTROSE 5 % IV SOLN
1.0000 g | INTRAVENOUS | Status: DC
  Administered 2017-06-03 – 2017-06-05 (×3): 1 g via INTRAVENOUS
  Filled 2017-06-03 (×4): qty 10

## 2017-06-03 MED ORDER — BACLOFEN 1 MG/ML ORAL SUSPENSION
10.0000 mg | Freq: Three times a day (TID) | ORAL | Status: DC
  Filled 2017-06-03 (×2): qty 1

## 2017-06-03 MED ORDER — SODIUM CHLORIDE 0.9 % IV BOLUS (SEPSIS)
1000.0000 mL | Freq: Once | INTRAVENOUS | Status: AC
  Administered 2017-06-03: 1000 mL via INTRAVENOUS

## 2017-06-03 MED ORDER — IPRATROPIUM-ALBUTEROL 0.5-2.5 (3) MG/3ML IN SOLN
3.0000 mL | Freq: Three times a day (TID) | RESPIRATORY_TRACT | Status: DC
  Administered 2017-06-03 – 2017-06-05 (×5): 3 mL via RESPIRATORY_TRACT
  Filled 2017-06-03 (×5): qty 3

## 2017-06-03 MED ORDER — ACETAMINOPHEN 325 MG PO TABS
650.0000 mg | ORAL_TABLET | Freq: Four times a day (QID) | ORAL | Status: DC | PRN
  Administered 2017-06-04 – 2017-06-06 (×3): 650 mg via ORAL
  Filled 2017-06-03 (×3): qty 2

## 2017-06-03 MED ORDER — PIPERACILLIN-TAZOBACTAM 3.375 G IVPB 30 MIN
3.3750 g | Freq: Once | INTRAVENOUS | Status: AC
  Administered 2017-06-03: 3.375 g via INTRAVENOUS
  Filled 2017-06-03: qty 50

## 2017-06-03 MED ORDER — IPRATROPIUM-ALBUTEROL 0.5-2.5 (3) MG/3ML IN SOLN: 3.0000 mL | RESPIRATORY_TRACT | Status: DC | PRN

## 2017-06-03 MED ORDER — JEVITY 1.2 CAL PO LIQD
1000.0000 mL | ORAL | Status: DC
  Filled 2017-06-03 (×2): qty 1000

## 2017-06-03 MED ORDER — ATORVASTATIN CALCIUM 40 MG PO TABS: 40.0000 mg | ORAL_TABLET | Freq: Every day | ORAL | Status: DC

## 2017-06-03 MED ORDER — VANCOMYCIN HCL IN DEXTROSE 1-5 GM/200ML-% IV SOLN
1000.0000 mg | Freq: Once | INTRAVENOUS | Status: DC
  Administered 2017-06-03: 1000 mg via INTRAVENOUS
  Filled 2017-06-03: qty 200

## 2017-06-03 MED ORDER — ONDANSETRON HCL 4 MG/2ML IJ SOLN: 4.0000 mg | Freq: Four times a day (QID) | INTRAMUSCULAR | Status: DC | PRN

## 2017-06-03 MED ORDER — ONDANSETRON HCL 4 MG/2ML IJ SOLN
4.0000 mg | Freq: Once | INTRAMUSCULAR | Status: AC
  Administered 2017-06-03: 4 mg via INTRAVENOUS
  Filled 2017-06-03: qty 2

## 2017-06-03 MED ORDER — PIPERACILLIN-TAZOBACTAM 3.375 G IVPB: 3.3750 g | Freq: Three times a day (TID) | INTRAVENOUS | Status: DC

## 2017-06-03 MED ORDER — VANCOMYCIN HCL 500 MG IV SOLR
500.0000 mg | Freq: Once | INTRAVENOUS | Status: DC
  Administered 2017-06-03: 500 mg via INTRAVENOUS
  Filled 2017-06-03: qty 500

## 2017-06-03 MED ORDER — FAMOTIDINE IN NACL 20-0.9 MG/50ML-% IV SOLN
20.0000 mg | Freq: Two times a day (BID) | INTRAVENOUS | Status: DC
  Administered 2017-06-03 – 2017-06-04 (×2): 20 mg via INTRAVENOUS
  Filled 2017-06-03 (×2): qty 50

## 2017-06-03 MED ORDER — ENOXAPARIN SODIUM 40 MG/0.4ML ~~LOC~~ SOLN
40.0000 mg | SUBCUTANEOUS | Status: DC
  Administered 2017-06-04 – 2017-06-06 (×3): 40 mg via SUBCUTANEOUS
  Filled 2017-06-03 (×3): qty 0.4

## 2017-06-03 MED ORDER — VANCOMYCIN HCL 10 G IV SOLR
1500.0000 mg | Freq: Once | INTRAVENOUS | Status: DC
  Filled 2017-06-03: qty 1500

## 2017-06-03 MED ORDER — SODIUM CHLORIDE 0.9 % IV SOLN
INTRAVENOUS | Status: DC
  Administered 2017-06-03 – 2017-06-06 (×8): via INTRAVENOUS

## 2017-06-03 MED ORDER — ACETAMINOPHEN 650 MG RE SUPP: 650.0000 mg | Freq: Four times a day (QID) | RECTAL | Status: DC | PRN

## 2017-06-03 MED ORDER — VANCOMYCIN HCL IN DEXTROSE 1-5 GM/200ML-% IV SOLN: 1000.0000 mg | Freq: Two times a day (BID) | INTRAVENOUS | Status: DC

## 2017-06-03 MED ORDER — IBUPROFEN 100 MG/5ML PO SUSP
400.0000 mg | Freq: Once | ORAL | Status: AC
  Administered 2017-06-03: 400 mg
  Filled 2017-06-03: qty 20

## 2017-06-03 MED ORDER — ASPIRIN 325 MG PO TABS: 325.0000 mg | ORAL_TABLET | Freq: Every day | ORAL | Status: DC

## 2017-06-03 MED ORDER — INSULIN ASPART 100 UNIT/ML ~~LOC~~ SOLN
0.0000 [IU] | SUBCUTANEOUS | Status: DC
  Administered 2017-06-04: 8 [IU] via SUBCUTANEOUS
  Administered 2017-06-04: 2 [IU] via SUBCUTANEOUS
  Administered 2017-06-04: 5 [IU] via SUBCUTANEOUS
  Administered 2017-06-05 (×5): 2 [IU] via SUBCUTANEOUS
  Administered 2017-06-06: 5 [IU] via SUBCUTANEOUS
  Administered 2017-06-06: 3 [IU] via SUBCUTANEOUS

## 2017-06-03 MED ORDER — ONDANSETRON HCL 4 MG PO TABS: 4.0000 mg | ORAL_TABLET | Freq: Four times a day (QID) | ORAL | Status: DC | PRN

## 2017-06-03 MED ORDER — ACETAMINOPHEN 650 MG RE SUPP
650.0000 mg | Freq: Once | RECTAL | Status: AC
  Administered 2017-06-03: 650 mg via RECTAL
  Filled 2017-06-03: qty 1

## 2017-06-03 MED ORDER — JEVITY 1.2 CAL PO LIQD
1000.0000 mL | ORAL | Status: DC
  Filled 2017-06-03 (×5): qty 1000

## 2017-06-03 MED ORDER — CLOPIDOGREL BISULFATE 75 MG PO TABS
75.0000 mg | ORAL_TABLET | Freq: Every day | ORAL | Status: DC
  Filled 2017-06-03: qty 1

## 2017-06-03 MED ORDER — FAMOTIDINE 20 MG PO TABS: 20.0000 mg | ORAL_TABLET | Freq: Two times a day (BID) | ORAL | Status: DC

## 2017-06-03 NOTE — ED Provider Notes (Signed)
Emergency Department Provider Note   I have reviewed the triage vital signs and the nursing notes.  Level 5 caveat: Prior CVA and confusion with sepsis.   HISTORY  Chief Complaint Code Sepsis   HPI Matthew Meza is a 76 y.o. male with PMH of DM, GERD, HLD, HTN, and prior CVA presents to the emergency department for evaluation of acute mental status change. Patient is currently at a skilled nursing facility with prior history of stroke. He arrives to the emergency department with fever and tachycardia. Deficits on the patient's left side are at baseline per EMS. No family at bedside for additional history.   The patient has stroke and has some confusion with likely ongoing infection and so HPI and ROS are significantly limited by these factors.   Past Medical History:  Diagnosis Date  . Diabetes mellitus without complication (HCC)    type 2  . GERD (gastroesophageal reflux disease)   . Hyperlipidemia   . Hypertension   . No pertinent past medical history   . Stroke [facility]) 04/12/2017    Patient Active Problem List   Diagnosis Date Noted  . Sepsis (HCC) 06/03/2017  . Intracerebral hemorrhage (HCC) - post neuro intervention 04/12/2017  . 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 Hyperlipidemia 04/12/2017  . Pneumonia, Probable 04/12/2017  . Urinary tract infection 04/12/2017  . Obesity 04/12/2017  . Family history of stroke 04/12/2017  . Lethargy 04/12/2017  . Pressure injury of skin 04/10/2017  . Dysarthria, post-stroke   . Dysphagia, post-stroke   . Tachypnea   . Prediabetes   . Leukocytosis   . Hypophosphatemia   . Stage 2 chronic kidney disease   . 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 Electrolyte imbalance 04/01/2017  . 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 Essential hypertension   . Acute encephalopathy   . Seasonal  allergic rhinitis 10/30/2012    Past Surgical History:  Procedure Laterality Date  . APPENDECTOMY    . ESOPHAGOGASTRODUODENOSCOPY N/A 04/07/2017   Procedure: ESOPHAGOGASTRODUODENOSCOPY (EGD);  Surgeon: Violeta Gelinas, MD;  Location: [facility] ENDOSCOPY;  Service: Endoscopy;  Laterality: N/A;  . IR ANGIO INTRA EXTRACRAN SEL COM CAROTID INNOMINATE UNI L MOD SED  03/31/2017  . IR INTRAVSC STENT CERV CAROTID W/O EMB-PROT MOD SED INC ANGIO  03/31/2017  . IR PERCUTANEOUS ART THROMBECTOMY/INFUSION INTRACRANIAL INC DIAG ANGIO  03/31/2017  . IR US GUIDE VASC ACCESS RIGHT  03/31/2017  . LAPAROSCOPIC APPENDECTOMY  06/21/2012   Procedure: APPENDECTOMY LAPAROSCOPIC;  Surgeon: Kandis Cocking, MD;  Location: [facility] ORS;  Service: General;  Laterality: N/A;  . NO PAST SURGERIES    . PEG PLACEMENT N/A 04/07/2017   Procedure: PERCUTANEOUS ENDOSCOPIC GASTROSTOMY (PEG) PLACEMENT;  Surgeon: Violeta Gelinas, MD;  Location: [facility] ENDOSCOPY;  Service: Endoscopy;  Laterality: N/A;  . RADIOLOGY WITH ANESTHESIA N/A 03/31/2017   Procedure: RADIOLOGY WITH ANESTHESIA;  Surgeon: Gilmer Mor, DO;  Location: [facility] OR;  Service: Anesthesiology;  Laterality: N/A;    Current Outpatient Rx  . Order #: [id number] Class: No Print  . Order #: [id number] Class: No Print  . Order #: [id number] Class: No Print  . Order #: [id number] Class: No Print  . Order #: [id number] Class: No Print  . Order #: [id number] Class: No Print  . Order #: [id number] Class: No Print  . Order #: [id number] Class: No Print  . Order #: [id number] Class: No  Print  . Order #: [id number] Class: No Print  . Order #: [id number] Class: No Print    Allergies No known allergies  Family History  Problem Relation Age of Onset  . Stroke Mother     Social History Social History  Substance Use Topics  . Smoking status: Former Smoker    Years: 20.00    Quit date: 06/22/1995  . Smokeless tobacco: Never Used  . Alcohol use No    Review of Systems  Level 5 caveat: CVA and AMS 2/2 sepsis.    ____________________________________________   PHYSICAL EXAM:  VITAL SIGNS: ED Triage Vitals [06/03/17 1200]  Enc Vitals Group     BP (!) 166/99     Pulse Rate (!) 129     Resp 18     Temp (!) 102.1 F (38.9 C)     Temp Source Rectal     SpO2 98 %   Constitutional: Alert but not communicating verbally. Eyes: Conjunctivae are normal. Right gaze preference.  Head: Atraumatic. Nose: No congestion/rhinnorhea. Mouth/Throat: Mucous membranes are dry.  Neck: No stridor.   Cardiovascular: Sinus tachycardia. Good peripheral circulation. Grossly normal heart sounds.   Respiratory: Normal respiratory effort.  No retractions. Lungs CTAB. Gastrointestinal: Soft and nontender. No distention.  Musculoskeletal: No lower extremity tenderness nor edema. No gross deformities of extremities. Neurologic: Baseline left sided weakness in the upper and lower extremities. Right gaze preference that will not cross midline. Will follow motor commands with the right upper and lower extremities.    Skin:  Skin is warm, dry and intact. No rash noted.  ____________________________________________   LABS (all labs ordered are listed, but only abnormal results are displayed)  Labs Reviewed  COMPREHENSIVE METABOLIC PANEL - Abnormal; Notable for the following:       Result Value   Glucose, Bld 172 (*)    Calcium 8.6 (*)    Albumin 2.8 (*)    Total Bilirubin 1.5 (*)    All other components within normal limits  CBC WITH DIFFERENTIAL/PLATELET - Abnormal; Notable for the following:    WBC 17.5 (*)    Neutro Abs 15.6 (*)    Monocytes Absolute 1.1 (*)    All other components within normal limits  URINALYSIS, ROUTINE W REFLEX MICROSCOPIC - Abnormal; Notable for the following:    APPearance HAZY (*)    Hgb urine dipstick SMALL (*)    Ketones, ur 5 (*)    Protein, ur 100 (*)    Leukocytes, UA MODERATE (*)    Bacteria, UA MANY (*)    Squamous Epithelial / LPF 0-5 (*)    All other components within  normal limits  I-STAT CG4 LACTIC ACID, ED - Abnormal; Notable for the following:    Lactic Acid, Venous 1.93 (*)    All other components within normal limits  CULTURE, BLOOD (ROUTINE X 2)  CULTURE, BLOOD (ROUTINE X 2)  PROTIME-INR  BRAIN NATRIURETIC PEPTIDE  I-STAT CG4 LACTIC ACID, ED   ____________________________________________  EKG   EKG Interpretation  Date/Time:  Saturday June 03 2017 12:08:29 EDT Ventricular Rate:  124 PR Interval:    QRS Duration: 125 QT Interval:  360 QTC Calculation: 518 R Axis:   40 Text Interpretation:  Sinus tachycardia Ventricular premature complex Right bundle branch block No STEMI. Similar to prior.  Confirmed by Alona Bene [phone]) on 06/03/2017 1:03:57 PM       ____________________________________________  RADIOLOGY  Ct Head Wo Contrast  Result Date: 06/03/2017 CLINICAL DATA:  Sudden onset altered  mental status, sepsis, history hypertension, diabetes mellitus, stroke EXAM: CT HEAD WITHOUT CONTRAST TECHNIQUE: Contiguous axial images were obtained from the base of the skull through the vertex without intravenous contrast. COMPARISON:  04/08/2017 FINDINGS: Brain: Generalized atrophy. Small vessel chronic ischemic changes of deep cerebral white matter. Normal ventricular morphology. RIGHT MCA territory infarcts which demonstrate evolution since previous exam. Old posterior RIGHT parietal white matter infarcts. Again identified lacunar infarcts at RIGHT basal ganglia and RIGHT thalamus. No intracranial hemorrhage, mass lesion, or extra-axial fluid collection. No new infarcts since previous exam. Vascular: Minimal atherosclerotic calcification at the internal carotid arteries bilaterally Skull: Intact Sinuses/Orbits: Scattered mucosal thickening in ethmoid air cells and maxillary sinuses. Orbits clear. Other: N/A IMPRESSION: Atrophy with small vessel chronic ischemic changes of deep cerebral white matter. Evolution of previously identified RIGHT MCA  territory infarcts. No new intracranial abnormalities. Electronically Signed   By: Ulyses SouthwardMark  Boles M.D.   On: 06/03/2017 13:54   Dg Chest Portable 1 View  Result Date: 06/03/2017 CLINICAL DATA:  Sepsis. EXAM: PORTABLE CHEST 1 VIEW COMPARISON:  04/11/2017 FINDINGS: Cardiomediastinal silhouette is normal for portable technique. There is no evidence of pleural effusion or pneumothorax. Low lung volumes. Unchanged bibasilar streaky peribronchial opacities. Osseous structures are without acute abnormality. Soft tissues are grossly normal. IMPRESSION: Unchanged bibasilar streaky peribronchial opacities. Electronically Signed   By: Ted Mcalpineobrinka  Dimitrova M.D.   On: 06/03/2017 13:17    ____________________________________________   PROCEDURES  Procedure(s) performed:   Procedures  CRITICAL CARE Performed by: Maia PlanJoshua G Natali Lavallee Total critical care time: 45 minutes Critical care time was exclusive of separately billable procedures and treating other patients. Critical care was necessary to treat or prevent imminent or life-threatening deterioration. Critical care was time spent personally by me on the following activities: development of treatment plan with patient and/or surrogate as well as nursing, discussions with consultants, evaluation of patient's response to treatment, examination of patient, obtaining history from patient or surrogate, ordering and performing treatments and interventions, ordering and review of laboratory studies, ordering and review of radiographic studies, pulse oximetry and re-evaluation of patient's condition.  Alona BeneJoshua Eldra Word, MD Emergency Medicine  ____________________________________________   INITIAL IMPRESSION / ASSESSMENT AND PLAN / ED COURSE  Pertinent labs & imaging results that were available during my care of the patient were reviewed by me and considered in my medical decision making (see chart for details).  Patient resents to the emergency department for evaluation  of acute onset altered mental status. He is from a skilled nursing facility and febrile and tachycardic. Blood pressure normal. Hypoxemia. Patient is unable to verbalize and provide additional history. No areas of cellulitis or ulcers to suggest a source. Sepsis protocol initiated. I started broad-spectrum antibiotics with unclear source at the time of ED presentation. We'll follow closely. No evidence of septic shock so 30 mL per KG bolus not administered but patient did receive 1L NS.   Patient with UTI and presentation consistent with urosepsis. IVF started along with broad spectrum abx. Occasional RUE repetitive movement. Could be rigors vs focal seizure but patient is following commands during the event on my re-evaluation. Favor rigors.   Discussed patient's case with Hospitalist, Dr. Adrian BlackwaterStinson. Patient and family (if present) updated with plan. Care transferred to Hospitalist service.  I reviewed all nursing notes, vitals, pertinent old records, EKGs, labs, imaging (as available).  ____________________________________________  FINAL CLINICAL IMPRESSION(S) / ED DIAGNOSES  Final diagnoses:  Acute cystitis without hematuria  Sepsis, due to unspecified organism (HCC)  MEDICATIONS GIVEN DURING THIS VISIT:  Medications  cefTRIAXone (ROCEPHIN) 1 g in dextrose 5 % 50 mL IVPB (1 g Intravenous New Bag/Given 06/03/17 1835)  sodium chloride 0.9 % bolus 1,000 mL (0 mLs Intravenous Stopped 06/03/17 1742)    Followed by  0.9 %  sodium chloride infusion ( Intravenous New Bag/Given 06/03/17 1523)  acetaminophen (TYLENOL) suppository 650 mg (650 mg Rectal Given 06/03/17 1243)  ondansetron (ZOFRAN) injection 4 mg (4 mg Intravenous Given 06/03/17 1243)  piperacillin-tazobactam (ZOSYN) IVPB 3.375 g (0 g Intravenous Stopped 06/03/17 1313)  sodium chloride 0.9 % bolus 1,000 mL (0 mLs Intravenous Stopped 06/03/17 1451)  ibuprofen (ADVIL,MOTRIN) 100 MG/5ML suspension 400 mg (400 mg Per Tube Given 06/03/17 1627)      NEW OUTPATIENT MEDICATIONS STARTED DURING THIS VISIT:  None   Note:  This document was prepared using Dragon voice recognition software and may include unintentional dictation errors.  Alona Bene, MD Emergency Medicine   Paz Winsett, Arlyss Repress, MD 06/03/17 [phone]

## 2017-06-03 NOTE — ED Notes (Signed)
Patient transported to CT 

## 2017-06-03 NOTE — ED Notes (Signed)
Pt peg tube leaking at site. Dressing removed; Gauze applied.

## 2017-06-03 NOTE — ED Notes (Signed)
Asher MuirJamie, pt daughter updated.

## 2017-06-03 NOTE — H&P (Addendum)
History and Physical  Matthew Meza WUJ:[id number] DOB: 1941/12/02 DOA: 06/03/2017  Referring physician: Dr. Jacqulyn Bathlong, ED physician PCP: Patient, No Pcp Per  Outpatient Specialists: None  Patient Coming From: Skilled nursing facility  Chief Complaint: Fever  HPI: Matthew Meza is a 76 y.o. male with a history of type 2 diabetes, GERD, hypertension, recent stroke with residual left-sided deficits. Patient is unable to communicate effectively and appears more confused than his baseline and is unable to provide history. He is brought to the hospital for evaluation of acute mental status change and fever. No palliating or provoking factors. Using mainly placed on code sepsis, received 1 liter of IV fluids. He was started on broad-spectrum antibiotics. Dr. evaluation shows a white count of 17.5 with 89% neutrophils. His lactic acid was 1.93. Urinalysis shows many bacteria with moderate leukocytes.  Review of Systems:  Patient unable to provide   Past Medical History:  Diagnosis Date  . Diabetes mellitus without complication (HCC)    type 2  . GERD (gastroesophageal reflux disease)   . Hyperlipidemia   . Hypertension   . No pertinent past medical history   . Stroke [facility](HCC) 04/12/2017   Past Surgical History:  Procedure Laterality Date  . APPENDECTOMY    . ESOPHAGOGASTRODUODENOSCOPY N/A 04/07/2017   Procedure: ESOPHAGOGASTRODUODENOSCOPY (EGD);  Surgeon: Violeta Gelinashompson, Burke, MD;  [facility];  Service: Endoscopy;  Laterality: N/A;  . IR ANGIO INTRA EXTRACRAN SEL COM CAROTID INNOMINATE UNI L MOD SED  03/31/2017  . IR INTRAVSC STENT CERV CAROTID W/O EMB-PROT MOD SED INC ANGIO  03/31/2017  . IR PERCUTANEOUS ART THROMBECTOMY/INFUSION INTRACRANIAL INC DIAG ANGIO  03/31/2017  . IR US GUIDE VASC ACCESS RIGHT  03/31/2017  . LAPAROSCOPIC APPENDECTOMY  06/21/2012   Procedure: APPENDECTOMY LAPAROSCOPIC;  Surgeon: Kandis Cockingavid H Newman, MD;  Location: [facility] ORS;  Service: General;  Laterality: N/A;  . NO  PAST SURGERIES    . PEG PLACEMENT N/A 04/07/2017   Procedure: PERCUTANEOUS ENDOSCOPIC GASTROSTOMY (PEG) PLACEMENT;  Surgeon: Violeta Gelinashompson, Burke, MD;  [facility];  Service: Endoscopy;  Laterality: N/A;  . RADIOLOGY WITH ANESTHESIA N/A 03/31/2017   Procedure: RADIOLOGY WITH ANESTHESIA;  Surgeon: Gilmer MorJaime Wagner, DO;  Location: [facility] OR;  Service: Anesthesiology;  Laterality: N/A;   Social History:  reports that he quit smoking about 21 years ago. He quit after 20.00 years of use. He has never used smokeless tobacco. He reports that he does not drink alcohol or use drugs. Patient lives at Skilled nursing facility  Allergies  Allergen Reactions  . No Known Allergies     Family History  Problem Relation Age of Onset  . Stroke Mother      Prior to Admission medications   Medication Sig Start Date End Date Taking? Authorizing Provider  amantadine (SYMMETREL) 50 MG/5ML solution Place 20 mLs (200 mg total) into feeding tube 2 (two) times daily. 04/12/17 04/17/17  Layne BentonBiby, Sharon L, NP  aspirin 325 MG tablet Place 1 tablet (325 mg total) into feeding tube daily. 04/13/17   Layne BentonBiby, Sharon L, NP  atorvastatin (LIPITOR) 40 MG tablet Place 1 tablet (40 mg total) into feeding tube daily at 6 PM. 04/12/17   Layne BentonBiby, Sharon L, NP  baclofen (LIORESAL) 10 mg/mL SUSP Take 1 mL (10 mg total) by mouth 3 (three) times daily. 04/12/17   Layne BentonBiby, Sharon L, NP  clopidogrel (PLAVIX) 75 MG tablet Place 1 tablet (75 mg total) into feeding tube daily. 04/13/17   Layne BentonBiby, Sharon L, NP  famotidine (PEPCID)  20 MG tablet Place 1 tablet (20 mg total) into feeding tube 2 (two) times daily. 04/12/17   Layne Benton, NP  insulin aspart (NOVOLOG) 100 UNIT/ML injection Inject 0-15 Units into the skin every 4 (four) hours. Patient not taking: Reported on 05/31/2017 04/12/17   Layne Benton, NP  insulin aspart (NOVOLOG) 100 UNIT/ML injection Inject 8 Units into the skin every 4 (four) hours. 04/12/17   Layne Benton, NP  ipratropium-albuterol (DUONEB)  0.5-2.5 (3) MG/3ML SOLN Take 3 mLs by nebulization every 4 (four) hours as needed. 04/12/17   Layne Benton, NP  ipratropium-albuterol (DUONEB) 0.5-2.5 (3) MG/3ML SOLN Take 3 mLs by nebulization 3 (three) times daily. 04/12/17   Layne Benton, NP  Nutritional Supplements (FEEDING SUPPLEMENT, JEVITY 1.2 CAL,) LIQD Place 1,000 mLs into feeding tube continuous. 04/12/17   Layne Benton, NP    Physical Exam: BP 126/73   Pulse (!) 114   Temp (!) 102.1 F (38.9 C) (Rectal)   Resp (!) 27   SpO2 98%   General: Elderly Caucasian male. Awake and alert. Appears confused No acute cardiopulmonary distress.  HEENT: Normocephalic atraumatic.  Right and left ears normal in appearance.  Pupils equal, round, reactive to light. Extraocular muscles are intact. Sclerae anicteric and noninjected.  Dry mucosal membranes. No mucosal lesions.  Neck: Neck supple without lymphadenopathy. No carotid bruits. No masses palpated.  Cardiovascular: Regular rate with normal S1-S2 sounds. No murmurs, rubs, gallops auscultated. No JVD.  Respiratory: Good respiratory effort with no wheezes, rales, rhonchi. Lungs clear to auscultation bilaterally.  No accessory muscle use. Abdomen: Soft, nontender, nondistended. Active bowel sounds. No masses or hepatosplenomegaly  Skin: No rashes, lesions, or ulcerations.  Dry, warm to touch. 2+ dorsalis pedis and radial pulses. Musculoskeletal: No calf or leg pain. All major joints not erythematous nontender.  No upper or lower joint deformation.  Good ROM.  No contractures  Psychiatric: Unable to determine Neurologic: Unable to follow many commands, although appears to have left-sided deficit which appears to be his baseline.          Sepsis - Repeat Assessment  Performed at:    1400  Vitals     Blood pressure 126/73, pulse (!) 114, temperature (!) 102.1 F (38.9 C), temperature source Rectal, resp. rate (!) 27, SpO2 98 %.  Heart:     Tachycardic  Lungs:    CTA  Capillary Refill:   >  2 sec  Peripheral Pulse:   Radial pulse palpable, Dorsalis pedis pulse  palpable and Posterior tibialis pulse  palpable  Skin:     Pale and Dry     Labs on Admission: I have personally reviewed following labs and imaging studies  CBC:  Recent Labs Lab 06/03/17 1215  WBC 17.5*  NEUTROABS 15.6*  HGB 14.2  HCT 42.6  MCV 91.2  PLT 155   Basic Metabolic Panel:  Recent Labs Lab 06/03/17 1215  NA 135  K 4.1  CL 102  CO2 23  GLUCOSE 172*  BUN 15  CREATININE 1.03  CALCIUM 8.6*   GFR: Estimated Creatinine Clearance: 65.7 mL/min (by C-G formula based on SCr of 1.03 mg/dL). Liver Function Tests:  Recent Labs Lab 06/03/17 1215  AST 26  ALT 28  ALKPHOS 110  BILITOT 1.5*  PROT 6.8  ALBUMIN 2.8*   No results for input(s): LIPASE, AMYLASE in the last 168 hours. No results for input(s): AMMONIA in the last 168 hours. Coagulation Profile:  Recent Labs Lab  06/03/17 1215  INR 1.17   Cardiac Enzymes: No results for input(s): CKTOTAL, CKMB, CKMBINDEX, TROPONINI in the last 168 hours. BNP (last 3 results) No results for input(s): PROBNP in the last 8760 hours. HbA1C: No results for input(s): HGBA1C in the last 72 hours. CBG: No results for input(s): GLUCAP in the last 168 hours. Lipid Profile: No results for input(s): CHOL, HDL, LDLCALC, TRIG, CHOLHDL, LDLDIRECT in the last 72 hours. Thyroid Function Tests: No results for input(s): TSH, T4TOTAL, FREET4, T3FREE, THYROIDAB in the last 72 hours. Anemia Panel: No results for input(s): VITAMINB12, FOLATE, FERRITIN, TIBC, IRON, RETICCTPCT in the last 72 hours. Urine analysis:    Component Value Date/Time   COLORURINE YELLOW 06/03/2017 1300   APPEARANCEUR HAZY (A) 06/03/2017 1300   LABSPEC 1.019 06/03/2017 1300   PHURINE 6.0 06/03/2017 1300   GLUCOSEU NEGATIVE 06/03/2017 1300   HGBUR SMALL (A) 06/03/2017 1300   BILIRUBINUR NEGATIVE 06/03/2017 1300   BILIRUBINUR neg 04/02/2015 1432   KETONESUR 5 (A) 06/03/2017  1300   PROTEINUR 100 (A) 06/03/2017 1300   UROBILINOGEN 1.0 04/02/2015 1432   UROBILINOGEN 0.2 04/23/2014 1238   NITRITE NEGATIVE 06/03/2017 1300   LEUKOCYTESUR MODERATE (A) 06/03/2017 1300   Sepsis Labs: @LABRCNTIP (procalcitonin:4,lacticidven:4) )No results found for this or any previous visit (from the past 240 hour(s)).   Radiological Exams on Admission: Ct Head Wo Contrast  Result Date: 06/03/2017 CLINICAL DATA:  Sudden onset altered mental status, sepsis, history hypertension, diabetes mellitus, stroke EXAM: CT HEAD WITHOUT CONTRAST TECHNIQUE: Contiguous axial images were obtained from the base of the skull through the vertex without intravenous contrast. COMPARISON:  04/08/2017 FINDINGS: Brain: Generalized atrophy. Small vessel chronic ischemic changes of deep cerebral white matter. Normal ventricular morphology. RIGHT MCA territory infarcts which demonstrate evolution since previous exam. Old posterior RIGHT parietal white matter infarcts. Again identified lacunar infarcts at RIGHT basal ganglia and RIGHT thalamus. No intracranial hemorrhage, mass lesion, or extra-axial fluid collection. No new infarcts since previous exam. Vascular: Minimal atherosclerotic calcification at the internal carotid arteries bilaterally Skull: Intact Sinuses/Orbits: Scattered mucosal thickening in ethmoid air cells and maxillary sinuses. Orbits clear. Other: N/A IMPRESSION: Atrophy with small vessel chronic ischemic changes of deep cerebral white matter. Evolution of previously identified RIGHT MCA territory infarcts. No new intracranial abnormalities. Electronically Signed   By: Ulyses Southward M.D.   On: 06/03/2017 13:54   Dg Chest Portable 1 View  Result Date: 06/03/2017 CLINICAL DATA:  Sepsis. EXAM: PORTABLE CHEST 1 VIEW COMPARISON:  04/11/2017 FINDINGS: Cardiomediastinal silhouette is normal for portable technique. There is no evidence of pleural effusion or pneumothorax. Low lung volumes. Unchanged bibasilar  streaky peribronchial opacities. Osseous structures are without acute abnormality. Soft tissues are grossly normal. IMPRESSION: Unchanged bibasilar streaky peribronchial opacities. Electronically Signed   By: Ted Mcalpine M.D.   On: 06/03/2017 13:17    EKG: Independently reviewed. Tachycardic with PVC. Right bundle branch block. No acute ST changes.  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 R ICA stent   Essential hypertension   Prediabetes   Stage 2 chronic kidney disease   Urinary tract infection    This patient was discussed with the ED physician, including pertinent vitals, physical exam findings, labs, and imaging.  We also discussed care given by the ED provider.  #1 sepsis  Admit to stepdown  Sepsis recheck as documented above  As the patient is still  tachycardic, will give the patient another liter fluid bolus and start him on continuous IV fluids at 125 mL per hour  Blood cultures and urine culture obtained prior to start of antibiotics  Transition patient from vancomycin and Zosyn to ceftriaxone #2 UTI  Urine culture done  Ceftriaxone  Check CBC in the morning  His previous urine culture 2 months ago showed Proteus that was sensitive to multiple antibiotics, except for Macrobid #3 diabetes  Sliding scale insulin every 4 hours  CBGs every 4 hours #4 hypertension  Stable #5 stage II chronic kidney disease  Appears stable #6 recent history of stroke  Continued deficits, no evidence of progression  Continue aspirin and Plavix  DVT prophylaxis: Lovenox Consultants: None Code Status: Presumed full code Family Communication: Unable to contact family  Disposition Plan: Admission to stepdown   Levie Heritage, DO [facility] Pager [phone]  If 7PM-7AM, please contact night-coverage [email or web address] Password [email or web address]

## 2017-06-03 NOTE — Progress Notes (Signed)
Pharmacy Antibiotic Note  Kem BoroughsRaymond J Haber is a 76 y.o. male admitted on 06/03/2017 with sepsis.  Pharmacy has been consulted for vancomycin and zosyn dosing. Tmax is 102.1 and WBC is elevated at 17.5. SCr is WNL at 1.03. Lactic acid is <2.   Plan: Vancomycin 1500mg  IV x 1 then 1gm IV Q12h Zosyn 3.375gm IV Q8H (4 hr inf) F/u renal fxn, C&S, clinical status and trough at SS     Temp (24hrs), Avg:102.1 F (38.9 C), Min:102.1 F (38.9 C), Max:102.1 F (38.9 C)   Recent Labs Lab 06/03/17 1215 06/03/17 1241  WBC 17.5*  --   CREATININE 1.03  --   LATICACIDVEN  --  1.93*    Estimated Creatinine Clearance: 65.7 mL/min (by C-G formula based on SCr of 1.03 mg/dL).    Allergies  Allergen Reactions  . No Known Allergies     Antimicrobials this admission: Vanc 6/30>> Zosyn 6/30>>  Dose adjustments this admission: N/A  Microbiology results: Pending  Thank you for allowing pharmacy to be a part of this patient's care.  Mariann Palo, Drake LeachRachel Lynn 06/03/2017 1:22 PM

## 2017-06-03 NOTE — ED Notes (Signed)
Pt having rigors to rt arm. Pt following commands; and trying to speak, too soft to hear at this time. EDP and Dr.Stinson made aware; ibuprofen ordered.

## 2017-06-04 DIAGNOSIS — Z431 Encounter for attention to gastrostomy: Secondary | ICD-10-CM

## 2017-06-04 DIAGNOSIS — N182 Chronic kidney disease, stage 2 (mild): Secondary | ICD-10-CM

## 2017-06-04 DIAGNOSIS — A419 Sepsis, unspecified organism: Principal | ICD-10-CM

## 2017-06-04 DIAGNOSIS — I69391 Dysphagia following cerebral infarction: Secondary | ICD-10-CM

## 2017-06-04 DIAGNOSIS — N3000 Acute cystitis without hematuria: Secondary | ICD-10-CM

## 2017-06-04 DIAGNOSIS — I10 Essential (primary) hypertension: Secondary | ICD-10-CM

## 2017-06-04 DIAGNOSIS — I639 Cerebral infarction, unspecified: Secondary | ICD-10-CM

## 2017-06-04 LAB — BASIC METABOLIC PANEL
Anion gap: 7 (ref 5–15)
BUN: 13 mg/dL (ref 6–20)
CALCIUM: 8.1 mg/dL — AB (ref 8.9–10.3)
CO2: 25 mmol/L (ref 22–32)
CREATININE: 0.9 mg/dL (ref 0.61–1.24)
Chloride: 107 mmol/L (ref 101–111)
GFR calc Af Amer: >60 mL/min (ref >60)
GLUCOSE: 156 mg/dL — AB (ref 65–99)
Potassium: 3.5 mmol/L (ref 3.5–5.1)
Sodium: 139 mmol/L (ref 135–145)

## 2017-06-04 LAB — GLUCOSE, CAPILLARY
GLUCOSE-CAPILLARY: 132 mg/dL — AB (ref 65–99)
GLUCOSE-CAPILLARY: 249 mg/dL — AB (ref 65–99)
Glucose-Capillary: 133 mg/dL — ABNORMAL HIGH (ref 65–99)
Glucose-Capillary: 160 mg/dL — ABNORMAL HIGH (ref 65–99)
Glucose-Capillary: 111 mg/dL — ABNORMAL HIGH (ref 65–99)

## 2017-06-04 LAB — BLOOD CULTURE ID PANEL (REFLEXED)
ACINETOBACTER BAUMANNII: NOT DETECTED
CANDIDA PARAPSILOSIS: NOT DETECTED
CANDIDA TROPICALIS: NOT DETECTED
Candida albicans: NOT DETECTED
Candida glabrata: NOT DETECTED
Candida krusei: NOT DETECTED
ENTEROBACTERIACEAE SPECIES: NOT DETECTED
Enterobacter cloacae complex: NOT DETECTED
Enterococcus species: NOT DETECTED
Escherichia coli: NOT DETECTED
HAEMOPHILUS INFLUENZAE: NOT DETECTED
KLEBSIELLA OXYTOCA: NOT DETECTED
KLEBSIELLA PNEUMONIAE: NOT DETECTED
Listeria monocytogenes: NOT DETECTED
NEISSERIA MENINGITIDIS: NOT DETECTED
PROTEUS SPECIES: NOT DETECTED
Pseudomonas aeruginosa: NOT DETECTED
SERRATIA MARCESCENS: NOT DETECTED
STAPHYLOCOCCUS AUREUS BCID: NOT DETECTED
STAPHYLOCOCCUS SPECIES: NOT DETECTED
STREPTOCOCCUS AGALACTIAE: NOT DETECTED
STREPTOCOCCUS SPECIES: NOT DETECTED
Streptococcus pneumoniae: NOT DETECTED
Streptococcus pyogenes: NOT DETECTED

## 2017-06-04 LAB — CBC
HCT: 39.1 % (ref 39.0–52.0)
Hemoglobin: 12.7 g/dL — ABNORMAL LOW (ref 13.0–17.0)
MCH: 30 pg (ref 26.0–34.0)
MCHC: 32.5 g/dL (ref 30.0–36.0)
MCV: 92.4 fL (ref 78.0–100.0)
PLATELETS: 137 10*3/uL — AB (ref 150–400)
RBC: 4.23 MIL/uL (ref 4.22–5.81)
RDW: 12.9 % (ref 11.5–15.5)
WBC: 10.8 10*3/uL — AB (ref 4.0–10.5)

## 2017-06-04 LAB — MRSA PCR SCREENING: MRSA by PCR: NEGATIVE

## 2017-06-04 LAB — LACTIC ACID, PLASMA: Lactic Acid, Venous: 1.9 mmol/L (ref 0.5–1.9)

## 2017-06-04 MED ORDER — FAMOTIDINE 20 MG PO TABS
20.0000 mg | ORAL_TABLET | Freq: Two times a day (BID) | ORAL | Status: DC
  Administered 2017-06-04 – 2017-06-06 (×4): 20 mg via ORAL
  Filled 2017-06-04 (×4): qty 1

## 2017-06-04 MED ORDER — ASPIRIN 325 MG PO TABS
325.0000 mg | ORAL_TABLET | Freq: Every day | ORAL | Status: DC
  Administered 2017-06-05 – 2017-06-06 (×2): 325 mg via ORAL
  Filled 2017-06-04 (×2): qty 1

## 2017-06-04 MED ORDER — ATORVASTATIN CALCIUM 40 MG PO TABS
40.0000 mg | ORAL_TABLET | Freq: Every day | ORAL | Status: DC
  Administered 2017-06-04 – 2017-06-06 (×3): 40 mg via ORAL
  Filled 2017-06-04 (×3): qty 1

## 2017-06-04 MED ORDER — BACLOFEN 10 MG PO TABS
10.0000 mg | ORAL_TABLET | Freq: Three times a day (TID) | ORAL | Status: DC
  Administered 2017-06-04 – 2017-06-06 (×7): 10 mg via ORAL
  Filled 2017-06-04 (×7): qty 1

## 2017-06-04 MED ORDER — CLOPIDOGREL BISULFATE 75 MG PO TABS
75.0000 mg | ORAL_TABLET | Freq: Every day | ORAL | Status: DC
  Administered 2017-06-05 – 2017-06-06 (×2): 75 mg via ORAL
  Filled 2017-06-04 (×2): qty 1

## 2017-06-04 NOTE — Progress Notes (Signed)
PEG tube removed at bedside by Dr Doylene Canardonner with surgery. Dry gauze applied to site. Daughter called and updated.

## 2017-06-04 NOTE — Consult Note (Signed)
Surgical Consultation Requesting provider: Dr. Gwenlyn Perking  CC: dislodged g tube  HPI: This is a 76 year old gentleman who sustained a significant stroke with residual left-sided deficits several months ago. Due to dysphasia he had a PEG placed on May 4 of this year by Dr. Janee Morn. He is subsequently gone to rehabilitation and apparently has been consuming a regular diet there with minimal use of the PEG. He was readmitted recently for sepsis from UTI and it was noted by nursing that anything instilled within the G-tube was leaking out in the skin and causing swelling in the skin surrounding the site.  He has been seen by speech today and they have cleared him for regular diet with plans to get a confirmatory swallow tomorrow. We are consulted due to problems with the feeding tube.  Allergies  Allergen Reactions  . No Known Allergies     Past Medical History:  Diagnosis Date  . Diabetes mellitus without complication (HCC)    type 2  . GERD (gastroesophageal reflux disease)   . Hyperlipidemia   . Hypertension   . No pertinent past medical history   . Stroke [facility]) 04/12/2017    Past Surgical History:  Procedure Laterality Date  . APPENDECTOMY    . ESOPHAGOGASTRODUODENOSCOPY N/A 04/07/2017   Procedure: ESOPHAGOGASTRODUODENOSCOPY (EGD);  Surgeon: Violeta Gelinas, MD;  Location: [facility] ENDOSCOPY;  Service: Endoscopy;  Laterality: N/A;  . IR ANGIO INTRA EXTRACRAN SEL COM CAROTID INNOMINATE UNI L MOD SED  03/31/2017  . IR INTRAVSC STENT CERV CAROTID W/O EMB-PROT MOD SED INC ANGIO  03/31/2017  . IR PERCUTANEOUS ART THROMBECTOMY/INFUSION INTRACRANIAL INC DIAG ANGIO  03/31/2017  . IR US GUIDE VASC ACCESS RIGHT  03/31/2017  . LAPAROSCOPIC APPENDECTOMY  06/21/2012   Procedure: APPENDECTOMY LAPAROSCOPIC;  Surgeon: Kandis Cocking, MD;  Location: [facility] ORS;  Service: General;  Laterality: N/A;  . NO PAST SURGERIES    . PEG PLACEMENT N/A 04/07/2017   Procedure: PERCUTANEOUS ENDOSCOPIC GASTROSTOMY (PEG) PLACEMENT;   Surgeon: Violeta Gelinas, MD;  Location: [facility] ENDOSCOPY;  Service: Endoscopy;  Laterality: N/A;  . RADIOLOGY WITH ANESTHESIA N/A 03/31/2017   Procedure: RADIOLOGY WITH ANESTHESIA;  Surgeon: Gilmer Mor, DO;  Location: [facility] OR;  Service: Anesthesiology;  Laterality: N/A;    Family History  Problem Relation Age of Onset  . Stroke Mother     Social History   Social History  . Marital status: Widowed    Spouse name: N/A  . Number of children: 1  . Years of education: GED   Occupational History  .      retired   Social History Main Topics  . Smoking status: Former Smoker    Years: 20.00    Quit date: 06/22/1995  . Smokeless tobacco: Never Used  . Alcohol use No  . Drug use: No  . Sexual activity: Not Asked   Other Topics Concern  . None   Social History Narrative   05/31/17 residing at [facility]    No current facility-administered medications on file prior to encounter.    Current Outpatient Prescriptions on File Prior to Encounter  Medication Sig Dispense Refill  . amantadine (SYMMETREL) 50 MG/5ML solution Place 20 mLs (200 mg total) into feeding tube 2 (two) times daily. 140 mL 0  . aspirin 325 MG tablet Place 1 tablet (325 mg total) into feeding tube daily.    Marland Kitchen atorvastatin (LIPITOR) 40 MG tablet Place 1 tablet (40 mg total) into feeding tube daily at 6 PM.    .  baclofen (LIORESAL) 10 mg/mL SUSP Take 1 mL (10 mg total) by mouth 3 (three) times daily.    . clopidogrel (PLAVIX) 75 MG tablet Place 1 tablet (75 mg total) into feeding tube daily.    . famotidine (PEPCID) 20 MG tablet Place 1 tablet (20 mg total) into feeding tube 2 (two) times daily.    . insulin aspart (NOVOLOG) 100 UNIT/ML injection Inject 0-15 Units into the skin every 4 (four) hours. 10 mL 11  . insulin aspart (NOVOLOG) 100 UNIT/ML injection Inject 8 Units into the skin every 4 (four) hours. 10 mL 11  . ipratropium-albuterol (DUONEB) 0.5-2.5 (3) MG/3ML SOLN Take 3 mLs by nebulization every 4 (four)  hours as needed. 360 mL   . ipratropium-albuterol (DUONEB) 0.5-2.5 (3) MG/3ML SOLN Take 3 mLs by nebulization 3 (three) times daily. 360 mL   . Nutritional Supplements (FEEDING SUPPLEMENT, JEVITY 1.2 CAL,) LIQD Place 1,000 mLs into feeding tube continuous. (Patient not taking: Reported on 06/03/2017)  0    Review of Systems: a complete, 10pt review of systems was completed with pertinent positives and negatives as documented in the HPI.   Physical Exam: Vitals:   06/04/17 0700 06/04/17 1155  BP: 116/70 122/70  Pulse:  92  Resp:  (!) 21  Temp: 97.6 F (36.4 C) 97.7 F (36.5 C)   Gen: Alert and answering questions Head: normocephalic, atraumatic, pupils equally round and reactive, anicteric.  Neck: supple without mass or thyromegaly Chest: unlabored respirations, symmetrical air entry Cardiovascular: RRR with palpable distal pulses Abdomen: Soft, nontender nondistended. The PEG tube site is presently clean and dry without signs of infection however the PEG is out to 1cm the skin whereas it appears by the bumper to have previously been at 3 cm. The tubing does not advance at all with gentle pressure. Extremities: warm, without edema, no deformities Neuro: Residual left-sided weakness Skin warm and dry  CBC Latest Ref Rng & Units 06/04/2017 06/03/2017 04/12/2017  WBC 4.0 - 10.5 K/uL 10.8(H) 17.5(H) 14.8(H)  Hemoglobin 13.0 - 17.0 g/dL 12.7(L) 14.2 11.8(L)  Hematocrit 39.0 - 52.0 % 39.1 42.6 35.4(L)  Platelets 150 - 400 K/uL 137(L) 155 236    CMP Latest Ref Rng & Units 06/04/2017 06/03/2017 04/12/2017  Glucose 65 - 99 mg/dL 161(W156(H) 960(A172(H) 540(J246(H)  BUN 6 - 20 mg/dL 13 15 18   Creatinine 0.61 - 1.24 mg/dL 8.110.90 9.141.03 7.821.17  Sodium 135 - 145 mmol/L 139 135 135  Potassium 3.5 - 5.1 mmol/L 3.5 4.1 3.8  Chloride 101 - 111 mmol/L 107 102 100(L)  CO2 22 - 32 mmol/L 25 23 26   Calcium 8.9 - 10.3 mg/dL 8.1(L) 8.6(L) 8.2(L)  Total Protein 6.5 - 8.1 g/dL - 6.8 -  Total Bilirubin 0.3 - 1.2 mg/dL - 1.5(H) -   Alkaline Phos 38 - 126 U/L - 110 -  AST 15 - 41 U/L - 26 -  ALT 17 - 63 U/L - 28 -    Lab Results  Component Value Date   INR 1.17 06/03/2017   INR 1.07 03/31/2017    Imaging: none  A/P: PEG placed 2 months ago, it is now dislodged with the mushroom just under the skin. Unclear how long that may have been the care. He has not been using it. I removed at the bedside today. Keep a dry dressing over the wound which will close in the day or 2. Please contact surgery with any questions or concerns.   Phylliss Blakeshelsea Addis Bennie, MD [facility], [place]  Pager [phone]

## 2017-06-04 NOTE — Progress Notes (Signed)
Spoke with pt's daughter Donnella BiDani Kirt, who lives in [place]. She [facility] and peg tube is not used regularly. She states she has been pushing to have peg tube removed per MD's discretion. I paged MD to ask for Speech evaluation.

## 2017-06-04 NOTE — Progress Notes (Signed)
Pt arrived to the unit tx to the bed from the stretcher. Pt assessed. When the peg tube and site was assess there was leaking noted at the site and a hard lump when flushed. MD made aware and gave order to not give anything through the peg tube at this time. Will continue to monitor pt.

## 2017-06-04 NOTE — Progress Notes (Signed)
[facility] PROGRESS NOTE  Matthew Meza ZOX:[id number] DOB: 1941-10-01 DOA: 06/03/2017 PCP: Patient, No Pcp Per  Interim summary and HPI 76 y.o. male with a history of type 2 diabetes, GERD, hypertension, recent stroke with residual left-sided deficits and dysphagia. Patient is unable to communicate effectively and appears more confused than his baseline and is unable to provide history. He is brought to the hospital for evaluation of acute mental status change and fever. No palliating or provoking factors. Found with sepsis due to UTI.  Assessment/Plan: 1-Sepsis due to UTI -sepsis features resolving -will continue IV antibiotics and supportive care -will follow culture results -patient stable to be moved to telemetry bed  2-diabetes: mainly due to tube feedings -will continue SSI -patient with TF malfunctioning  -will follow CBG's  3-dysphagia and TF dependent: due to residual deficit from stroke -having trouble with PEG -IR and surgery contacted for assistance with tube -feedings on hold for now   4-stage 2 CKD: -stable and at baseline -will monitor trend  -continue IVF's  5-recent hx of stroke -plan is to continue ASA and Plavix as soon as PEG tube function restored  6-HTN -stable overall w/o medications -will monitor  7-HLD -continue statins when able to use PEG again  8-hx of parkinson's -will continue symmetrel when PEG tube working again.  9-acute toxic encephalopathy -mentation is essentially back to baseline -will continue treatment and management of infection.  Code Status: Full Family Communication: no family at bedside Disposition Plan: remains inpatient, continue IV antibiotics, follow rec's from IR regarding PEG tube. Ok to move to telemetry bed.   Consultants:  IR (to help with evaluation of PEG malfunction)  Procedures:  See below for x-ray reports   Antibiotics:  Rocephin   HPI/Subjective: Afebrile currently; but spiked high  grade fever overnight, no CP, no nausea, no vomiting. Overall feeling better.  Objective: Vitals:   06/04/17 0306 06/04/17 0700  BP: 126/70 116/70  Pulse: 84   Resp: 18   Temp: 97.4 F (36.3 C) 97.6 F (36.4 C)    Intake/Output Summary (Last 24 hours) at 06/04/17 1003 Last data filed at 06/04/17 0900  Gross per 24 hour  Intake          4652.08 ml  Output             1400 ml  Net          3252.08 ml   Filed Weights   06/03/17 2100  Weight: 81.4 kg (179 lb 7.3 oz)    Exam:   General:  Spiked fever overnight, no CP and no SOB. Patient with dysarthria (at baseline) and also unchanged left hemiparesis. No abd pain.  Cardiovascular: NSR, S1 and S2 appreciated, no rubs, no gallops, no JVD appreciated.  Respiratory: scattered rhonchi, no wheezing, no crackles; no using accessory muscles. Good O2 sat on 2L; slightly tachypneic.  Abdomen: soft, PEG in place, positive BS, no distensions. Mild serosanguineous drainage around peg tube appreciated; no erythema.  Musculoskeletal: no cyanosis or clubbing; trace edema bilaterally (L > R)  Data Reviewed: Basic Metabolic Panel:  Recent Labs Lab 06/03/17 1215 06/04/17 0235  NA 135 139  K 4.1 3.5  CL 102 107  CO2 23 25  GLUCOSE 172* 156*  BUN 15 13  CREATININE 1.03 0.90  CALCIUM 8.6* 8.1*   Liver Function Tests:  Recent Labs Lab 06/03/17 1215  AST 26  ALT 28  ALKPHOS 110  BILITOT 1.5*  PROT 6.8  ALBUMIN 2.8*  CBC:  Recent Labs Lab 06/03/17 1215 06/04/17 0235  WBC 17.5* 10.8*  NEUTROABS 15.6*  --   HGB 14.2 12.7*  HCT 42.6 39.1  MCV 91.2 92.4  PLT 155 137*   BNP (last 3 results)  Recent Labs  06/03/17 1245  BNP 58.9   CBG:  Recent Labs Lab 06/03/17 2302 06/04/17 0304 06/04/17 0744  GLUCAP 143* 133* 160*    Recent Results (from the past 240 hour(s))  MRSA PCR Screening     Status: None   Collection Time: 06/03/17 11:20 PM  Result Value Ref Range Status   MRSA by PCR NEGATIVE NEGATIVE Final     Comment:        The GeneXpert MRSA Assay (FDA approved for NASAL specimens only), is one component of a comprehensive MRSA colonization surveillance program. It is not intended to diagnose MRSA infection nor to guide or monitor treatment for MRSA infections.      Studies: Ct Head Wo Contrast  Result Date: 06/03/2017 CLINICAL DATA:  Sudden onset altered mental status, sepsis, history hypertension, diabetes mellitus, stroke EXAM: CT HEAD WITHOUT CONTRAST TECHNIQUE: Contiguous axial images were obtained from the base of the skull through the vertex without intravenous contrast. COMPARISON:  04/08/2017 FINDINGS: Brain: Generalized atrophy. Small vessel chronic ischemic changes of deep cerebral white matter. Normal ventricular morphology. RIGHT MCA territory infarcts which demonstrate evolution since previous exam. Old posterior RIGHT parietal white matter infarcts. Again identified lacunar infarcts at RIGHT basal ganglia and RIGHT thalamus. No intracranial hemorrhage, mass lesion, or extra-axial fluid collection. No new infarcts since previous exam. Vascular: Minimal atherosclerotic calcification at the internal carotid arteries bilaterally Skull: Intact Sinuses/Orbits: Scattered mucosal thickening in ethmoid air cells and maxillary sinuses. Orbits clear. Other: N/A IMPRESSION: Atrophy with small vessel chronic ischemic changes of deep cerebral white matter. Evolution of previously identified RIGHT MCA territory infarcts. No new intracranial abnormalities. Electronically Signed   By: Ulyses SouthwardMark  Boles M.D.   On: 06/03/2017 13:54   Dg Chest Portable 1 View  Result Date: 06/03/2017 CLINICAL DATA:  Sepsis. EXAM: PORTABLE CHEST 1 VIEW COMPARISON:  04/11/2017 FINDINGS: Cardiomediastinal silhouette is normal for portable technique. There is no evidence of pleural effusion or pneumothorax. Low lung volumes. Unchanged bibasilar streaky peribronchial opacities. Osseous structures are without acute abnormality.  Soft tissues are grossly normal. IMPRESSION: Unchanged bibasilar streaky peribronchial opacities. Electronically Signed   By: Ted Mcalpineobrinka  Dimitrova M.D.   On: 06/03/2017 13:17    Scheduled Meds: . aspirin  325 mg Per Tube Daily  . atorvastatin  40 mg Per Tube q1800  . baclofen  10 mg Oral TID  . clopidogrel  75 mg Per Tube Daily  . enoxaparin (LOVENOX) injection  40 mg Subcutaneous Q24H  . insulin aspart  0-15 Units Subcutaneous Q4H  . ipratropium-albuterol  3 mL Nebulization TID   Continuous Infusions: . sodium chloride 125 mL/hr (06/04/17 0900)  . cefTRIAXone (ROCEPHIN)  IV Stopped (06/03/17 1905)  . famotidine (PEPCID) IV Stopped (06/04/17 0014)  . 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 If 7PM-7AM, please contact night-coverage at [email or web address], password [email or web address] 06/04/2017, 10:03 AM  LOS: 1 day

## 2017-06-04 NOTE — Evaluation (Signed)
Clinical/Bedside Swallow Evaluation Patient Details  Name: Matthew Meza MRN: [id number] Date of Birth: 1941/03/10  Today's Date: 06/04/2017 Time: SLP Start Time (ACUTE ONLY): 1236 SLP Stop Time (ACUTE ONLY): 1255 SLP Time Calculation (min) (ACUTE ONLY): 19 min  Past Medical History:  Past Medical History:  Diagnosis Date  . Diabetes mellitus without complication (HCC)    type 2  . GERD (gastroesophageal reflux disease)   . Hyperlipidemia   . Hypertension   . No pertinent past medical history   . Stroke [facility]) 04/12/2017   Past Surgical History:  Past Surgical History:  Procedure Laterality Date  . APPENDECTOMY    . ESOPHAGOGASTRODUODENOSCOPY N/A 04/07/2017   Procedure: ESOPHAGOGASTRODUODENOSCOPY (EGD);  Surgeon: Violeta Gelinas, MD;  Location: [facility] ENDOSCOPY;  Service: Endoscopy;  Laterality: N/A;  . IR ANGIO INTRA EXTRACRAN SEL COM CAROTID INNOMINATE UNI L MOD SED  03/31/2017  . IR INTRAVSC STENT CERV CAROTID W/O EMB-PROT MOD SED INC ANGIO  03/31/2017  . IR PERCUTANEOUS ART THROMBECTOMY/INFUSION INTRACRANIAL INC DIAG ANGIO  03/31/2017  . IR US GUIDE VASC ACCESS RIGHT  03/31/2017  . LAPAROSCOPIC APPENDECTOMY  06/21/2012   Procedure: APPENDECTOMY LAPAROSCOPIC;  Surgeon: Kandis Cocking, MD;  Location: [facility] ORS;  Service: General;  Laterality: N/A;  . NO PAST SURGERIES    . PEG PLACEMENT N/A 04/07/2017   Procedure: PERCUTANEOUS ENDOSCOPIC GASTROSTOMY (PEG) PLACEMENT;  Surgeon: Violeta Gelinas, MD;  Location: [facility] ENDOSCOPY;  Service: Endoscopy;  Laterality: N/A;  . RADIOLOGY WITH ANESTHESIA N/A 03/31/2017   Procedure: RADIOLOGY WITH ANESTHESIA;  Surgeon: Gilmer Mor, DO;  Location: [facility] OR;  Service: Anesthesiology;  Laterality: N/A;   HPI:  76 y.o.malewith a history of type 2 diabetes, GERD, hypertension, recent stroke with residual left-sided deficits and dysphagia. Patient is unable to communicate effectively and appears more confused than his baseline and is unable to provide history. He  is brought to the hospital for evaluation of acute mental status change and fever. No palliating or provoking factors. Found with sepsis due to UTI. Pt has a history of CVA, has a PEG tube but also consumes a regualr diet and thin liquids per family and has wanted PEG tube removed. CXR shows unchaged beribronchial opacities from last CXR in May 2018 when pt was admitted for CVA, at that time characterized as bibasilar opacities which may be atelectasis, pneumonia or aspiration given distribution. Possible small pleural effusions.  Last MBS from 04/2017 showed severe oropharyngeal dysphagia with unsensed residuals, weakness. Pt lethargic during therapy attempts and PEG placed prior to d/c.    Assessment / Plan / Recommendation Clinical Impression  Pt demonstrates no signs of aspiration or impaired swallowing under subjective observation. Per pt and daughter, he has been consuming a regular diet and thin liquids at SNF. Given recent history of severe dysphagia and question of need to remove PEG tube, will proceed with a f/u MBS for objective assessment of swallowing to help pt and daughter make a plan for PO intake. They are in agreement. Pt may resume a regular diet and thin liquids for now.  SLP Visit Diagnosis: Dysphagia, oropharyngeal phase (R13.12)    Aspiration Risk  Mild aspiration risk    Diet Recommendation Regular;Thin liquid   Liquid Administration via: Cup;Straw Medication Administration: Whole meds with liquid Supervision: Staff to assist with self feeding Compensations: Slow rate;Small sips/bites;Lingual sweep for clearance of pocketing Postural Changes: Seated upright at 90 degrees    Other  Recommendations Oral Care Recommendations: Oral care BID   Follow up  Recommendations Skilled Nursing facility      Frequency and Duration            Prognosis        Swallow Study   General HPI: 76 y.o.malewith a history of type 2 diabetes, GERD, hypertension, recent stroke with  residual left-sided deficits and dysphagia. Patient is unable to communicate effectively and appears more confused than his baseline and is unable to provide history. He is brought to the hospital for evaluation of acute mental status change and fever. No palliating or provoking factors. Found with sepsis due to UTI. Pt has a history of CVA, has a PEG tube but also consumes a regualr diet and thin liquids per family and has wanted PEG tube removed. CXR shows unchaged beribronchial opacities from last CXR in May 2018 when pt was admitted for CVA, at that time characterized as bibasilar opacities which may be atelectasis, pneumonia or aspiration given distribution. Possible small pleural effusions.  Last MBS from 04/2017 showed severe oropharyngeal dysphagia with unsensed residuals, weakness. Pt lethargic during therapy attempts and PEG placed prior to d/c.  Type of Study: Bedside Swallow Evaluation Previous Swallow Assessment: see  Diet Prior to this Study: NPO Temperature Spikes Noted: No Respiratory Status: Room air History of Recent Intubation: No Behavior/Cognition: Alert;Cooperative;Pleasant mood Oral Cavity Assessment: Within Functional Limits Oral Care Completed by SLP: No Oral Cavity - Dentition: Adequate natural dentition Vision: Functional for self-feeding Self-Feeding Abilities: Able to feed self;Needs assist Patient Positioning: Upright in bed Baseline Vocal Quality: Low vocal intensity Volitional Cough: Strong Volitional Swallow: Able to elicit    Oral/Motor/Sensory Function Overall Oral Motor/Sensory Function: Mild impairment Facial ROM: Reduced left;Suspected CN VII (facial) dysfunction Facial Symmetry: Abnormal symmetry left;Suspected CN VII (facial) dysfunction Facial Strength: Reduced left Lingual ROM: Within Functional Limits Lingual Symmetry: Within Functional Limits Lingual Strength: Within Functional Limits Lingual Sensation: Within Functional Limits Velum: Within  Functional Limits Mandible: Within Functional Limits   Ice Chips     Thin Liquid Thin Liquid: Within functional limits Presentation: Cup;Straw;Self Fed    Nectar Thick Nectar Thick Liquid: Not tested   Honey Thick Honey Thick Liquid: Not tested   Puree Puree: Within functional limits   Solid   GO   Solid: Within functional limits Presentation: Self Fed       Harlon DittyBonnie Sarinity Dicicco, MA CCC-SLP [phone]  Claudine MoutonDeBlois, Brandalynn Ofallon Caroline 06/04/2017,1:02 PM

## 2017-06-04 NOTE — Progress Notes (Signed)
Report called to 5W, pt will be transferred in the bed on room air.

## 2017-06-04 NOTE — Progress Notes (Signed)
PHARMACY - PHYSICIAN COMMUNICATION CRITICAL VALUE ALERT - BLOOD CULTURE IDENTIFICATION (BCID)  Results for orders placed or performed during the hospital encounter of 06/03/17  Blood Culture ID Panel (Reflexed) (Collected: 06/03/2017 12:33 PM)  Result Value Ref Range   Enterococcus species NOT DETECTED NOT DETECTED   Listeria monocytogenes NOT DETECTED NOT DETECTED   Staphylococcus species NOT DETECTED NOT DETECTED   Staphylococcus aureus NOT DETECTED NOT DETECTED   Streptococcus species NOT DETECTED NOT DETECTED   Streptococcus agalactiae NOT DETECTED NOT DETECTED   Streptococcus pneumoniae NOT DETECTED NOT DETECTED   Streptococcus pyogenes NOT DETECTED NOT DETECTED   Acinetobacter baumannii NOT DETECTED NOT DETECTED   Enterobacteriaceae species NOT DETECTED NOT DETECTED   Enterobacter cloacae complex NOT DETECTED NOT DETECTED   Escherichia coli NOT DETECTED NOT DETECTED   Klebsiella oxytoca NOT DETECTED NOT DETECTED   Klebsiella pneumoniae NOT DETECTED NOT DETECTED   Proteus species NOT DETECTED NOT DETECTED   Serratia marcescens NOT DETECTED NOT DETECTED   Haemophilus influenzae NOT DETECTED NOT DETECTED   Neisseria meningitidis NOT DETECTED NOT DETECTED   Pseudomonas aeruginosa NOT DETECTED NOT DETECTED   Candida albicans NOT DETECTED NOT DETECTED   Candida glabrata NOT DETECTED NOT DETECTED   Candida krusei NOT DETECTED NOT DETECTED   Candida parapsilosis NOT DETECTED NOT DETECTED   Candida tropicalis NOT DETECTED NOT DETECTED    Name of physician (or Provider) Contacted: [place]   Changes to prescribed antibiotics required: BCID negative, GPC in aerobic bottle only (likely contaminant)  Matthew Meza, Matthew Meza 06/04/2017  5:40 PM

## 2017-06-05 ENCOUNTER — Inpatient Hospital Stay (HOSPITAL_COMMUNITY): Payer: Medicare HMO

## 2017-06-05 ENCOUNTER — Encounter (HOSPITAL_COMMUNITY): Payer: Self-pay | Admitting: *Deleted

## 2017-06-05 HISTORY — PX: IR REPLC GASTRO/COLONIC TUBE PERCUT W/FLUORO: IMG2333

## 2017-06-05 LAB — GLUCOSE, CAPILLARY
GLUCOSE-CAPILLARY: 150 mg/dL — AB (ref 65–99)
GLUCOSE-CAPILLARY: 139 mg/dL — AB (ref 65–99)
GLUCOSE-CAPILLARY: 136 mg/dL — AB (ref 65–99)
Glucose-Capillary: 168 mg/dL — ABNORMAL HIGH (ref 65–99)
Glucose-Capillary: 123 mg/dL — ABNORMAL HIGH (ref 65–99)
Glucose-Capillary: 120 mg/dL — ABNORMAL HIGH (ref 65–99)
Glucose-Capillary: 140 mg/dL — ABNORMAL HIGH (ref 65–99)

## 2017-06-05 MED ORDER — ENSURE ENLIVE PO LIQD
237.0000 mL | Freq: Two times a day (BID) | ORAL | Status: DC
  Administered 2017-06-06 (×2): 237 mL via ORAL

## 2017-06-05 MED ORDER — LIDOCAINE VISCOUS 2 % MT SOLN
OROMUCOSAL | Status: AC
  Filled 2017-06-05: qty 15

## 2017-06-05 MED ORDER — LIDOCAINE VISCOUS 2 % MT SOLN
OROMUCOSAL | Status: DC | PRN
  Administered 2017-06-05: 15 mL via OROMUCOSAL

## 2017-06-05 MED ORDER — ADULT MULTIVITAMIN W/MINERALS CH
1.0000 | ORAL_TABLET | Freq: Every day | ORAL | Status: DC
  Administered 2017-06-05 – 2017-06-06 (×2): 1 via ORAL
  Filled 2017-06-05 (×2): qty 1

## 2017-06-05 MED ORDER — IOPAMIDOL (ISOVUE-300) INJECTION 61%
INTRAVENOUS | Status: AC
  Filled 2017-06-05: qty 50

## 2017-06-05 NOTE — Progress Notes (Addendum)
Initial Nutrition Assessment  DOCUMENTATION CODES:   Not applicable  INTERVENTION:   -D/c Jevity 1.2 @ 75 ml/hr, due to no feeding access -MVI daily -Ensure Enlive po BID, each supplement provides 350 kcal and 20 grams of protein  NUTRITION DIAGNOSIS:   Inadequate oral intake related to lethargy/confusion as evidenced by meal completion < 50%.  GOAL:   Patient will meet greater than or equal to 90% of their needs  MONITOR:   PO intake, Supplement acceptance, Labs, Weight trends, Skin, I & O's  REASON FOR ASSESSMENT:    (TF)    ASSESSMENT:   Matthew Meza is a 76 y.o. male with a history of type 2 diabetes, GERD, hypertension, recent stroke with residual left-sided deficits. Patient is unable to communicate effectively and appears more confused than his baseline and is unable to provide history. He is brought to the hospital for evaluation of acute mental status change and fever. No palliating or provoking factors. Using mainly placed on code sepsis, received 1 liter of IV fluids. He was started on broad-spectrum antibiotics. Dr. evaluation shows a white count of 17.5 with 89% neutrophils. His lactic acid was 1.93. Urinalysis shows many bacteria with moderate leukocytes.  RD drawn to chart due to TF being ordered.   Pt sleeping soundly at time of visit. Lunch tray just arrived and has been unattempted. Meal completion 30%.   Reviewed records from SNF; pertinent orders include a regular diet and 120 ml Medpass BID. Pt was not using PEG PTA. Noted PEG was removed by general surgery on 06/04/17.   SLP evaluated on 71/18 and was advanced to a regular diet with thin liquids. Pt refused MBSS this AM.   Reviewed wt hx. No wt changes significant for time frame.   Nutrition-Focused physical exam completed. Findings are no fat depletion, no muscle depletion, and no edema.   Labs reviewed: CBGS: 111-150.   Diet Order:  Diet regular Room service appropriate? Yes; Fluid  consistency: Thin  Skin:  Wound (see comment) (DPTI coccyx, st I sacrum)  Last BM:  06/05/17  Height:   Ht Readings from Last 1 Encounters:  06/04/17 5\' 7"  (1.702 m)    Weight:   Wt Readings from Last 1 Encounters:  06/04/17 189 lb 13.1 oz (86.1 kg)    Ideal Body Weight:  61.8 kg  BMI:  Body mass index is 29.73 kg/m.  Estimated Nutritional Needs:   Kcal:  1700-1900  Protein:  85-100 grams  Fluid:  1.7-1.9 L  EDUCATION NEEDS:   Education needs addressed  Matthew Meza, RD, LDN, CDE Pager: [phone] After hours Pager: [phone]

## 2017-06-05 NOTE — Progress Notes (Signed)
  Speech Language Pathology Treatment: Dysphagia  Patient Details Name: Kem BoroughsRaymond J Boccio MRN: [id number] DOB: 10-Jan-1941 Today's Date: 06/05/2017 Time: [id number] SLP Time Calculation (min) (ACUTE ONLY): 11 min  Assessment / Plan / Recommendation Clinical Impression  Pt scheduled for MBS this am following bedside swallow assessment yesterday. No s/s aspiration were present however given history of severe dysphagia 04/2017, PEG placement (now removed) MBS was recommended to fully assess safety with regular texture/thin liquids prior to returning to SNF. Pt refusing transport to xray department. SLP educated pt to reasoning for MBS although he appears confused (mild during session). ST observed pt with thin water and regular graham cracker solid. No oral difficulty or s/s aspiration. He politely stated he did not want to have MBS (no family at bedside). Will see once more with greater volume po's.    HPI HPI: 76 y.o.malewith a history of type 2 diabetes, GERD, hypertension, recent stroke with residual left-sided deficits and dysphagia. Patient is unable to communicate effectively and appears more confused than his baseline and is unable to provide history. He is brought to the hospital for evaluation of acute mental status change and fever. No palliating or provoking factors. Found with sepsis due to UTI. Pt has a history of CVA, has a PEG tube but also consumes a regualr diet and thin liquids per family and has wanted PEG tube removed. CXR shows unchaged beribronchial opacities from last CXR in May 2018 when pt was admitted for CVA, at that time characterized as bibasilar opacities which may be atelectasis, pneumonia or aspiration given distribution. Possible small pleural effusions.  Last MBS from 04/2017 showed severe oropharyngeal dysphagia with unsensed residuals, weakness. Pt lethargic during therapy attempts and PEG placed prior to d/c.       SLP Plan  Continue with current plan of care        Recommendations  Diet recommendations: Thin liquid;Regular Liquids provided via: Cup;Straw Medication Administration: Whole meds with puree Supervision: Patient able to self feed;Intermittent supervision to cue for compensatory strategies Compensations: Slow rate;Small sips/bites;Lingual sweep for clearance of pocketing Postural Changes and/or Swallow Maneuvers: Seated upright 90 degrees                Oral Care Recommendations: Oral care BID Follow up Recommendations: Skilled Nursing facility SLP Visit Diagnosis: Dysphagia, oropharyngeal phase (R13.12) Plan: Continue with current plan of care       GO                Royce MacadamiaLitaker, Chrisoula Zegarra Willis 06/05/2017, 11:26 AM  Breck CoonsLisa Willis Lonell FaceLitaker M.Ed [employer]-SLP Pager [phone]

## 2017-06-05 NOTE — Clinical Social Work Note (Signed)
Clinical Social Work Assessment  Patient Details  Name: Matthew Meza MRN: [id number] Date of Birth: 02-26-1941  Date of referral:  06/05/17               Reason for consult:  Facility Placement, Discharge Planning                Permission sought to share information with:  Facility Medical sales representativeContact Representative, Family Supports Permission granted to share information::  Yes, Verbal Permission Granted  Name::     Pension scheme managerDani  Agency::  SNF  Relationship::  Daughter  Contact Information:     Housing/Transportation Living arrangements for the past 2 months:  Skilled Building surveyorursing Facility Source of Information:  Adult Children Patient Interpreter Needed:  None Criminal Activity/Legal Involvement Pertinent to Current Situation/Hospitalization:  No - Comment as needed Significant Relationships:  Adult Children Lives with:  Self, Facility Resident Do you feel safe going back to the place where you live?  Yes Need for family participation in patient care:  Yes (Comment)  Care giving concerns:  Patient has [facility] the [facility] of [facility], and per daughter, has no concerns about level of care received there.   Social Worker assessment / plan:  CSW introduced self to patient's daughter over telephone Valentino Saxon(Dani, [phone]). CSW confirmed with patient's daughter that [facility] had [facility], and whether or not the plan was for the patient to return when ready to leave the hospital. Patient's daughter explained level of satisfaction with the care received, and how the nurse had even taken the time to call her from her personal cell phone on the evening that the patient had been brought to the hospital to keep her informed of his situation. CSW explained role in discharge planning, and process for getting patient back to [facility] when medically ready. CSW sent updated information [facility] to [facility], and will follow to facilitate discharge.  Employment status:  Retired Glass blower/designernsurance  information:  Managed Medicare PT Recommendations:  Not assessed at this time Information / Referral to community resources:  Skilled Nursing Facility  Patient/Family's Response to care:  Patient's response unable to be assessed due to not being fully oriented. Patient's daughter is agreeable to [facility].  Patient/Family's Understanding of and Emotional Response to Diagnosis, Current Treatment, and Prognosis:  Patient's emotional understanding unable to be assessed due to not being fully oriented. Patient's daughter expressed how she didn't like that she had to have the patient placed in a nursing home, but that she was satisfied [facility] the [facility]. Patient's daughter indicated understanding of CSW role in discharge planning.  Emotional Assessment Appearance:  Appears stated age Attitude/Demeanor/Rapport:  Unable to Assess Affect (typically observed):  Unable to Assess Orientation:  Oriented to Self, Oriented to  Time Alcohol / Substance use:  Not Applicable Psych involvement (Current and /or in the community):  No (Comment)  Discharge Needs  Concerns to be addressed:  Care Coordination, Discharge Planning Concerns Readmission within the last 30 days:  No Current discharge risk:  Physical Impairment Barriers to Discharge:  English as a second language teachernsurance Authorization, Continued Medical Work up   Dollar GeneralElizabeth M Everett Ricciardelli, LCSW 06/05/2017, 4:20 PM

## 2017-06-05 NOTE — Progress Notes (Signed)
[facility] PROGRESS NOTE  WILFRID HYSER UEA:[id number] DOB: [birth year] DOA: 06/03/2017 PCP: Patient, No Pcp Per  Interim summary and HPI 76 y.o. male with a history of type 2 diabetes, GERD, hypertension, recent stroke with residual left-sided deficits and dysphagia. Patient is unable to communicate effectively and appears more confused than his baseline and is unable to provide history. He is brought to the hospital for evaluation of acute mental status change and fever. No palliating or provoking factors. Found with sepsis due to UTI.  Assessment/Plan: 1-Sepsis due to UTI -patient improved and in no distress -sepsis features resolving/resolved -will continue IV antibiotics and follow culture report -continue supportive care, PRN analgesics and antiemetics -hopefully discharge in 1-2 days.  2-diabetes: mainly due to tube feedings -continue SSI -follow CBG's  3-dysphagia and TF dependent: due to residual deficit from stroke -patient PEG was complete dislodge -general surgery contacted and tube removed -patient evaluated by SPL and tolerating oral diet -will monitor  4-stage 2 CKD: -continue IVF's -condition is stable and at baseline  -will monitor trend   5-recent hx of stroke: with residual left hemiparesis  -no new deficit appreciated -will continue ASA and Plavix. -back to SNF for rehabilitation and care  6-HTN -BP remains stable -will monitor -he was off meds prior to admission -continue heart healthy diet   7-HLD -continue statins   8-hx of parkinson's -will continue Symmetrel -condition stable  9-acute toxic encephalopathy -mentation is essentially back to baseline -continue treatment for infection. -continue supportive care   Code Status: Full Family Communication: no family at bedside Disposition Plan: remains inpatient, continue IV antibiotics, follow culture results. Overall improved and sepsis features  resolving.   Consultants:  General surgery   Procedures:  See below for x-ray reports   Antibiotics:  Rocephin   HPI/Subjective: Feeling much better overall. No CP or SOB. denies abd pain, nausea and vomiting. Patient tolerating PO's and has remained afebrile.  Objective: Vitals:   06/05/17 0519 06/05/17 1759  BP: (!) 151/72 (!) 149/87  Pulse: (!) 104 85  Resp: 20 17  Temp: 97.6 F (36.4 C) 98.4 F (36.9 C)    Intake/Output Summary (Last 24 hours) at 06/05/17 1849 Last data filed at 06/05/17 1600  Gross per 24 hour  Intake              120 ml  Output              800 ml  Net             -680 ml   Filed Weights   06/03/17 2100 06/04/17 1831  Weight: 81.4 kg (179 lb 7.3 oz) 86.1 kg (189 lb 13.1 oz)    Exam:   General:  Afebrile, no CP, no SOB, no nausea, no vomiting. Tolerating oral intake w/o problems and denying acute complaints. Patient with dysarthria and residual left hemiparesis from previous stroke (unchanged).  Cardiovascular: no rubs, no gallops, no JVD, S1 and S2 appreciated. On exam.  Respiratory: good air movement; no requiring oxygen supplementation. No wheezing, no crackles  Abdomen: soft, NT, ND, positive BS. PEG tube removed by general surgery service.  Musculoskeletal: no cyanosis, no clubbing. Positive trace edema bilaterally appreciated.   Data Reviewed: Basic Metabolic Panel:  Recent Labs Lab 06/03/17 1215 06/04/17 0235  NA 135 139  K 4.1 3.5  CL 102 107  CO2 23 25  GLUCOSE 172* 156*  BUN 15 13  CREATININE 1.03 0.90  CALCIUM 8.6* 8.1*   Liver  Function Tests:  Recent Labs Lab 06/03/17 1215  AST 26  ALT 28  ALKPHOS 110  BILITOT 1.5*  PROT 6.8  ALBUMIN 2.8*   CBC:  Recent Labs Lab 06/03/17 1215 06/04/17 0235  WBC 17.5* 10.8*  NEUTROABS 15.6*  --   HGB 14.2 12.7*  HCT 42.6 39.1  MCV 91.2 92.4  PLT 155 137*   BNP (last 3 results)  Recent Labs  06/03/17 1245  BNP 58.9   CBG:  Recent Labs Lab  06/05/17 0118 06/05/17 0518 06/05/17 0822 06/05/17 1223 06/05/17 1734  GLUCAP 150* 123* 120* 139* 136*    Recent Results (from the past 240 hour(s))  Culture, blood (Routine x 2)     Status: None (Preliminary result)   Collection Time: 06/03/17 12:19 PM  Result Value Ref Range Status   Specimen Description BLOOD RIGHT ANTECUBITAL  Final   Special Requests   Final    IN PEDIATRIC BOTTLE Blood Culture results may not be optimal due to an excessive volume of blood received in culture bottles   Culture NO GROWTH 2 DAYS  Final   Report Status PENDING  Incomplete  Culture, blood (Routine x 2)     Status: Abnormal (Preliminary result)   Collection Time: 06/03/17 12:33 PM  Result Value Ref Range Status   Specimen Description BLOOD RIGHT HAND  Final   Special Requests   Final    BOTTLES DRAWN AEROBIC AND ANAEROBIC Blood Culture adequate volume   Culture  Setup Time   Final    GRAM POSITIVE COCCI IN CLUSTERS IN BOTH AEROBIC AND ANAEROBIC BOTTLES CRITICAL RESULT CALLED TO, READ BACK BY AND VERIFIED WITH: R RUMBARGER,PHARMD AT 1738 06/04/17 BY L BENFIELD    Culture (A)  Final    STAPHYLOCOCCUS SPECIES (COAGULASE NEGATIVE) THE SIGNIFICANCE OF ISOLATING THIS ORGANISM FROM A SINGLE SET OF BLOOD CULTURES WHEN MULTIPLE SETS ARE DRAWN IS UNCERTAIN. PLEASE NOTIFY THE MICROBIOLOGY DEPARTMENT WITHIN ONE WEEK IF SPECIATION AND SENSITIVITIES ARE REQUIRED.    Report Status PENDING  Incomplete  Blood Culture ID Panel (Reflexed)     Status: None   Collection Time: 06/03/17 12:33 PM  Result Value Ref Range Status   Enterococcus species NOT DETECTED NOT DETECTED Final   Listeria monocytogenes NOT DETECTED NOT DETECTED Final   Staphylococcus species NOT DETECTED NOT DETECTED Final   Staphylococcus aureus NOT DETECTED NOT DETECTED Final   Streptococcus species NOT DETECTED NOT DETECTED Final   Streptococcus agalactiae NOT DETECTED NOT DETECTED Final   Streptococcus pneumoniae NOT DETECTED NOT DETECTED  Final   Streptococcus pyogenes NOT DETECTED NOT DETECTED Final   Acinetobacter baumannii NOT DETECTED NOT DETECTED Final   Enterobacteriaceae species NOT DETECTED NOT DETECTED Final   Enterobacter cloacae complex NOT DETECTED NOT DETECTED Final   Escherichia coli NOT DETECTED NOT DETECTED Final   Klebsiella oxytoca NOT DETECTED NOT DETECTED Final   Klebsiella pneumoniae NOT DETECTED NOT DETECTED Final   Proteus species NOT DETECTED NOT DETECTED Final   Serratia marcescens NOT DETECTED NOT DETECTED Final   Haemophilus influenzae NOT DETECTED NOT DETECTED Final   Neisseria meningitidis NOT DETECTED NOT DETECTED Final   Pseudomonas aeruginosa NOT DETECTED NOT DETECTED Final   Candida albicans NOT DETECTED NOT DETECTED Final   Candida glabrata NOT DETECTED NOT DETECTED Final   Candida krusei NOT DETECTED NOT DETECTED Final   Candida parapsilosis NOT DETECTED NOT DETECTED Final   Candida tropicalis NOT DETECTED NOT DETECTED Final  MRSA PCR Screening     Status:  None   Collection Time: 06/03/17 11:20 PM  Result Value Ref Range Status   MRSA by PCR NEGATIVE NEGATIVE Final    Comment:        The GeneXpert MRSA Assay (FDA approved for NASAL specimens only), is one component of a comprehensive MRSA colonization surveillance program. It is not intended to diagnose MRSA infection nor to guide or monitor treatment for MRSA infections.      Studies: No results found.  Scheduled Meds: . aspirin  325 mg Oral Daily  . atorvastatin  40 mg Oral q1800  . baclofen  10 mg Oral TID  . clopidogrel  75 mg Oral Daily  . enoxaparin (LOVENOX) injection  40 mg Subcutaneous Q24H  . famotidine  20 mg Oral BID  . feeding supplement (ENSURE ENLIVE)  237 mL Oral BID BM  . insulin aspart  0-15 Units Subcutaneous Q4H  . iopamidol      . lidocaine      . multivitamin with minerals  1 tablet Oral Daily   Continuous Infusions: . sodium chloride 125 mL/hr at 06/05/17 1427  . 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 If 7PM-7AM, please contact night-coverage at [email or web address], password [email or web address] 06/05/2017, 6:49 PM  LOS: 2 days

## 2017-06-05 NOTE — NC FL2 (Signed)
[place] MEDICAID FL2 LEVEL OF CARE SCREENING TOOL     IDENTIFICATION  Patient Name: Matthew Meza Birthdate: 1941-06-10 Sex: male Admission Date (Current Location): 06/03/2017  [facility] [place]   Facility and Address:  [facility], [address], [place], [place] [address]      Provider Number: [id number]  Attending Physician Name and Address:  Vassie LollMadera, Carlos, MD  Relative Name and Phone Number:       Current Level of Care: Hospital Recommended Level of Care: Skilled Nursing Facility Prior Approval Number:    Date Approved/Denied:   PASRR Number: [id number]  Discharge Plan: SNF    Current Diagnoses: Patient Active Problem List   Diagnosis Date Noted  . Sepsis (HCC) 06/03/2017  . Intracerebral hemorrhage (HCC) - post neuro intervention 04/12/2017  . 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 Hyperlipidemia 04/12/2017  . Pneumonia, Probable 04/12/2017  . Urinary tract infection 04/12/2017  . Obesity 04/12/2017  . Family history of stroke 04/12/2017  . Lethargy 04/12/2017  . Pressure injury of skin 04/10/2017  . Dysarthria, post-stroke   . Dysphagia, post-stroke   . Tachypnea   . Prediabetes   . Leukocytosis   . Hypophosphatemia   . Stage 2 chronic kidney disease   . 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 Electrolyte imbalance 04/01/2017  . 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 Essential hypertension   . Acute encephalopathy   . Seasonal allergic rhinitis 10/30/2012    Orientation RESPIRATION BLADDER Height & Weight     Self, Time  Normal Incontinent, External catheter Weight: 189 lb 13.1 oz (86.1 kg) Height:  5\' 7"  (170.2 cm)  BEHAVIORAL SYMPTOMS/MOOD NEUROLOGICAL BOWEL NUTRITION STATUS      Continent    AMBULATORY STATUS COMMUNICATION OF NEEDS Skin   Extensive  Assist Verbally PU Stage and Appropriate Care PU Stage 1 Dressing:  (every 3 days)                     Personal Care Assistance Level of Assistance  Bathing, Dressing Bathing Assistance: Maximum assistance   Dressing Assistance: Maximum assistance     Functional Limitations Info             SPECIAL CARE FACTORS FREQUENCY  PT (By licensed PT), OT (By licensed OT)     PT Frequency: will assess OT Frequency: will assess            Contractures      Additional Factors Info  Code Status, Allergies, Insulin Sliding Scale Code Status Info: full Allergies Info: nka   Insulin Sliding Scale Info: every 4 hours       Current Medications (06/05/2017):  This is the current hospital active medication list Current Facility-Administered Medications  Medication Dose Route Frequency Provider Last Rate Last Dose  . 0.9 %  sodium chloride infusion   Intravenous Continuous Levie HeritageStinson, Jacob J, DO 125 mL/hr at 06/05/17 1427    . acetaminophen (TYLENOL) tablet 650 mg  650 mg Oral Q6H PRN Levie HeritageStinson, Jacob J, DO   650 mg at 06/04/17 1300   Or  . acetaminophen (TYLENOL) suppository 650 mg  650 mg Rectal Q6H PRN Levie HeritageStinson, Jacob J, DO      . aspirin tablet 325 mg  325 mg Oral Daily Vassie LollMadera, Carlos, MD   325 mg  at 06/05/17 0911  . atorvastatin (LIPITOR) tablet 40 mg  40 mg Oral q1800 Vassie Loll, MD   40 mg at 06/04/17 1633  . baclofen (LIORESAL) tablet 10 mg  10 mg Oral TID Vassie Loll, MD   10 mg at 06/05/17 0911  . cefTRIAXone (ROCEPHIN) 1 g in dextrose 5 % 50 mL IVPB  1 g Intravenous Q24H Levie Heritage, DO   Stopped at 06/04/17 1702  . clopidogrel (PLAVIX) tablet 75 mg  75 mg Oral Daily Vassie Loll, MD   75 mg at 06/05/17 0911  . enoxaparin (LOVENOX) injection 40 mg  40 mg Subcutaneous Q24H Levie Heritage, DO   40 mg at 06/05/17 9604  . famotidine (PEPCID) tablet 20 mg  20 mg Oral BID Vassie Loll, MD   20 mg at 06/05/17 0911  . feeding supplement (ENSURE ENLIVE) (ENSURE  ENLIVE) liquid 237 mL  237 mL Oral BID BM Vassie Loll, MD      . insulin aspart (novoLOG) injection 0-15 Units  0-15 Units Subcutaneous Q4H Levie Heritage, DO   2 Units at 06/05/17 1242  . iopamidol (ISOVUE-300) 61 % injection           . ipratropium-albuterol (DUONEB) 0.5-2.5 (3) MG/3ML nebulizer solution 3 mL  3 mL Nebulization Q4H PRN Levie Heritage, DO      . lidocaine (XYLOCAINE) 2 % viscous mouth solution           . multivitamin with minerals tablet 1 tablet  1 tablet Oral Daily Vassie Loll, MD      . ondansetron [facility]) tablet 4 mg  4 mg Oral Q6H PRN Levie Heritage, DO       Or  . ondansetron [facility]) injection 4 mg  4 mg Intravenous Q6H PRN Levie Heritage, DO         Discharge Medications: Please see discharge summary for a list of discharge medications.  Relevant Imaging Results:  Relevant Lab Results:   Additional Information SS#: [id number]  Baldemar Lenis, LCSW

## 2017-06-06 ENCOUNTER — Encounter (HOSPITAL_COMMUNITY): Payer: Self-pay | Admitting: Interventional Radiology

## 2017-06-06 DIAGNOSIS — I69354 Hemiplegia and hemiparesis following cerebral infarction affecting left non-dominant side: Secondary | ICD-10-CM

## 2017-06-06 DIAGNOSIS — R7303 Prediabetes: Secondary | ICD-10-CM

## 2017-06-06 LAB — CULTURE, BLOOD (ROUTINE X 2): SPECIAL REQUESTS: ADEQUATE

## 2017-06-06 LAB — CBC
HEMATOCRIT: 35.2 % — AB (ref 39.0–52.0)
HEMOGLOBIN: 12.2 g/dL — AB (ref 13.0–17.0)
MCH: 30.9 pg (ref 26.0–34.0)
MCHC: 34.7 g/dL (ref 30.0–36.0)
MCV: 89.1 fL (ref 78.0–100.0)
Platelets: 156 10*3/uL (ref 150–400)
RBC: 3.95 MIL/uL — ABNORMAL LOW (ref 4.22–5.81)
RDW: 12.8 % (ref 11.5–15.5)
WBC: 6.3 10*3/uL (ref 4.0–10.5)

## 2017-06-06 LAB — BASIC METABOLIC PANEL
Anion gap: 8 (ref 5–15)
BUN: 10 mg/dL (ref 6–20)
CALCIUM: 8.1 mg/dL — AB (ref 8.9–10.3)
CO2: 24 mmol/L (ref 22–32)
CREATININE: 0.75 mg/dL (ref 0.61–1.24)
Chloride: 107 mmol/L (ref 101–111)
GFR calc non Af Amer: >60 mL/min (ref >60)
Glucose, Bld: 115 mg/dL — ABNORMAL HIGH (ref 65–99)
Potassium: 3.2 mmol/L — ABNORMAL LOW (ref 3.5–5.1)
SODIUM: 139 mmol/L (ref 135–145)

## 2017-06-06 LAB — GLUCOSE, CAPILLARY
GLUCOSE-CAPILLARY: 110 mg/dL — AB (ref 65–99)
GLUCOSE-CAPILLARY: 115 mg/dL — AB (ref 65–99)
Glucose-Capillary: 120 mg/dL — ABNORMAL HIGH (ref 65–99)
Glucose-Capillary: 202 mg/dL — ABNORMAL HIGH (ref 65–99)
Glucose-Capillary: 170 mg/dL — ABNORMAL HIGH (ref 65–99)

## 2017-06-06 MED ORDER — ENSURE ENLIVE PO LIQD: 237.0000 mL | Freq: Two times a day (BID) | ORAL | Status: DC

## 2017-06-06 MED ORDER — CEFUROXIME AXETIL 500 MG PO TABS: 500.0000 mg | ORAL_TABLET | Freq: Two times a day (BID) | ORAL | Status: AC

## 2017-06-06 MED ORDER — POTASSIUM CHLORIDE CRYS ER 20 MEQ PO TBCR
40.0000 meq | EXTENDED_RELEASE_TABLET | Freq: Once | ORAL | Status: AC
  Administered 2017-06-06: 40 meq via ORAL
  Filled 2017-06-06: qty 2

## 2017-06-06 NOTE — Progress Notes (Signed)
Patient will DC to: [facility] Anticipated DC date: 06/06/17 Family notified: Daughter Transport by: Sharin MonsPTAR   Per MD [facility]. RN, patient, patient's family, and facility notified of DC. Discharge Summary sent to facility. RN given number for report [phone]). DC packet on chart. Ambulance transport requested for patient.   CSW signing off.  Cristobal GoldmannNadia Caramia Boutin, [place]CSWA Clinical Social Worker [phone]

## 2017-06-06 NOTE — Progress Notes (Signed)
Attempted report x 2 

## 2017-06-06 NOTE — Discharge Summary (Signed)
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 Discharge back to SNF for further care and rehabilitation.   Diet recommendation: heart healthy and modified carbohydrates   Filed Weights   06/03/17 2100 06/04/17 1831  Weight: 81.4 kg (179 lb 7.3 oz) 86.1 kg (189 lb 13.1 oz)    Brief History of present illness:  76 y.o.malewith a history of type 2 diabetes, GERD, hypertension, recent stroke with residual left-sided deficits and dysphagia. Patient is unable to communicate effectively and appears more confused than his baseline and is unable to provide history. He is brought to the hospital for evaluation of acute mental status change and fever. No palliating or provoking factors. Found with sepsis due to UTI.  Hospital Course:  1-Sepsis due to UTI -patient improved and in no distress -no culture growth to direct antibiotics -empirically has responded well to use of ceftriaxone -prior urine culture positive for Proteus Mirabilis sensitive to ceftriaxone -sepsis features resolved -will switch antibiotics to PO and complete 7 more days at discharge (Ceftin 500mg  BID was the antibiotic of choice) -advise to maintain adequate hydration.   2-diabetes: mainly due to tube feedings -continue SSI -continue modified carbohydrates    3-dysphagia and TF dependent: due to residual deficit from stroke -patient PEG was complete dislodge and out of place on admission. -general surgery contacted and PEG tube removed -patient evaluated by SPL and tolerating oral diet (regular consistency and thin liquids) -follow wound care (per surgery service rec's, just dry dressing changes).  4-stage 2 CKD: -condition is stable and at baseline  -will recommend BMET after discharge to follow on renal function trend   5-recent hx of stroke: with residual left hemiparesis  -no new deficit appreciated -will continue ASA and Plavix. -back to SNF for further rehabilitation and care  6-HTN -BP remains stable -continue monitoring VS and follow heart healthy diet  -he was off meds prior to admission  7-HLD -continue statins   8-hx of parkinson's -will continue Symmetrel -condition stable overall.  9-acute toxic encephalopathy -mentation is essentially back to baseline -will continue treatment for infection. -continue supportive care  -concerns for parkinson's/vascular dementia at baseline; patient expressed that after stroke he has been more forgetful than ever.  Procedures:  See below for x-ray reports   Consultations:  General surgery   Discharge Exam: Vitals:   06/05/17 2025 06/06/17 0427  BP: (!) 149/78 (!) 151/80  Pulse: 84 79  Resp: 18 18  Temp: 98.3 F (36.8 C) 97.6 F (36.4 C)    General:  Afebrile, no CP, no SOB, no nausea, no vomiting. Tolerating oral intake w/o problems and denying acute complaints. Patient with baseline dysarthria and residual left hemiparesis from previous stroke (unchanged). AAOX2.  Cardiovascular: no rubs, no gallops, no JVD, S1 and S2 appreciated. On exam.  Respiratory: good air movement; no requiring oxygen supplementation. No wheezing,  no crackles  Abdomen: soft, NT, ND, positive BS. PEG tube removed by general surgery service; clean dressings in place.  Musculoskeletal:  no cyanosis, no clubbing. Positive trace edema bilaterally appreciated.    Discharge Instructions   Discharge Instructions    Diet - low sodium heart healthy    Complete by:  As directed    Discharge instructions    Complete by:  As directed    Maintain adequate hydration Repeat BMET in 5 days to follow electrolytes and renal function  Take antibiotics as instructed (7 more days pending at discharge; last dose 06/13/17) Follow heart healthy and modified carbohydrates diet Resume rehabilitation as per SNF protocol     Current Discharge Medication List    START taking these medications   Details  cefUROXime (CEFTIN) 500 MG tablet Take 1 tablet (500 mg total) by mouth 2 (two) times daily.      CONTINUE these medications which have CHANGED   Details  feeding supplement, ENSURE ENLIVE, (ENSURE ENLIVE) LIQD Take 237 mLs by mouth 2 (two) times daily between meals.      CONTINUE these medications which have NOT CHANGED   Details  amantadine (SYMMETREL) 50 MG/5ML solution Place 20 mLs (200 mg total) into feeding tube 2 (two) times daily. Qty: 140 mL, Refills: 0    aspirin 325 MG tablet Place 1 tablet (325 mg total) into feeding tube daily.    atorvastatin (LIPITOR) 40 MG tablet Place 1 tablet (40 mg total) into feeding tube daily at 6 PM.    baclofen (LIORESAL) 10 mg/mL SUSP Take 1 mL (10 mg total) by mouth 3 (three) times daily.    clopidogrel (PLAVIX) 75 MG tablet Place 1 tablet (75 mg total) into feeding tube daily.    famotidine (PEPCID) 20 MG tablet Place 1 tablet (20 mg total) into feeding tube 2 (two) times daily.    !! insulin aspart (NOVOLOG) 100 UNIT/ML injection Inject 0-15 Units into the skin every 4 (four) hours. Qty: 10 mL, Refills: 11    !! insulin aspart (NOVOLOG) 100 UNIT/ML injection Inject 8 Units into the skin every 4 (four) hours. Qty: 10 mL, Refills: 11    !! ipratropium-albuterol (DUONEB) 0.5-2.5 (3) MG/3ML SOLN Take 3 mLs by nebulization every 4 (four)  hours as needed. Qty: 360 mL    !! ipratropium-albuterol (DUONEB) 0.5-2.5 (3) MG/3ML SOLN Take 3 mLs by nebulization 3 (three) times daily. Qty: 360 mL     !! - Potential duplicate medications found. Please discuss with provider.     Allergies  Allergen Reactions  . No Known Allergies       The results of significant diagnostics from this hospitalization (including imaging, microbiology, ancillary and laboratory) are listed below for reference.    Significant Diagnostic Studies: Ct Head Wo Contrast  Result Date: 06/03/2017 CLINICAL DATA:  Sudden onset altered mental status, sepsis, history hypertension, diabetes mellitus, stroke EXAM: CT HEAD WITHOUT CONTRAST TECHNIQUE: Contiguous axial images were obtained from the base of the skull through the vertex without intravenous contrast. COMPARISON:  04/08/2017 FINDINGS: Brain: Generalized atrophy. Small vessel chronic ischemic changes of deep cerebral white matter. Normal ventricular morphology. RIGHT MCA territory infarcts which demonstrate evolution since previous exam. Old posterior RIGHT parietal white matter infarcts. Again identified lacunar infarcts at RIGHT basal ganglia and RIGHT thalamus. No intracranial hemorrhage, mass lesion, or extra-axial fluid collection. No new infarcts since previous exam. Vascular: Minimal atherosclerotic calcification at the internal carotid arteries bilaterally Skull: Intact Sinuses/Orbits: Scattered mucosal thickening in  ethmoid air cells and maxillary sinuses. Orbits clear. Other: N/A IMPRESSION: Atrophy with small vessel chronic ischemic changes of deep cerebral white matter. Evolution of previously identified RIGHT MCA territory infarcts. No new intracranial abnormalities. Electronically Signed   By: Ulyses SouthwardMark  Boles M.D.   On: 06/03/2017 13:54   Ir Replc Gastro/colonic Tube Percut W/fluoro  Result Date: 06/06/2017 INDICATION: Surgical placed feeding tube, inadvertently removed. EXAM: GASTROSTOMY CATHETER  REPLACEMENT MEDICATIONS: Viscous lidocaine topically FLUOROSCOPY TIME:  Less than 6 seconds COMPLICATIONS: None immediate. PROCEDURE: Informed written consent was obtained from the patient after a thorough discussion of the procedural risks, benefits and alternatives. All questions were addressed. A timeout was performed prior to the initiation of the procedure. Viscous lidocaine was placed at the gastrostomy site. A 20 French replacement to would not advance easily. Contrast injection confirmed the tract patent to the stomach without extravasation. A 5 [place] Kumpe catheter was negotiated through the tract into the gastric lumen. This was exchanged over an Amplatz wire for dilator which facilitated placement of a 20 French balloon retention gastrostomy catheter. The balloon was inflated with 10 mL sterile saline in the external bumper applied. Contrast injection confirms appropriate positioning and patency. IMPRESSION: Technically successful replacement of 20 French balloon retention gastrostomy catheter Electronically Signed   By: Corlis Leak  Hassell M.D.   On: 06/06/2017 09:53   Dg Chest Portable 1 View  Result Date: 06/03/2017 CLINICAL DATA:  Sepsis. EXAM: PORTABLE CHEST 1 VIEW COMPARISON:  04/11/2017 FINDINGS: Cardiomediastinal silhouette is normal for portable technique. There is no evidence of pleural effusion or pneumothorax. Low lung volumes. Unchanged bibasilar streaky peribronchial opacities. Osseous structures are without acute abnormality. Soft tissues are grossly normal. IMPRESSION: Unchanged bibasilar streaky peribronchial opacities. Electronically Signed   By: Ted Mcalpineobrinka  Dimitrova M.D.   On: 06/03/2017 13:17    Microbiology: Recent Results (from the past 240 hour(s))  Culture, blood (Routine x 2)     Status: None (Preliminary result)   Collection Time: 06/03/17 12:19 PM  Result Value Ref Range Status   Specimen Description BLOOD RIGHT ANTECUBITAL  Final   Special Requests   Final    IN PEDIATRIC  BOTTLE Blood Culture results may not be optimal due to an excessive volume of blood received in culture bottles   Culture NO GROWTH 3 DAYS  Final   Report Status PENDING  Incomplete  Culture, blood (Routine x 2)     Status: Abnormal   Collection Time: 06/03/17 12:33 PM  Result Value Ref Range Status   Specimen Description BLOOD RIGHT HAND  Final   Special Requests   Final    BOTTLES DRAWN AEROBIC AND ANAEROBIC Blood Culture adequate volume   Culture  Setup Time   Final    GRAM POSITIVE COCCI IN CLUSTERS IN BOTH AEROBIC AND ANAEROBIC BOTTLES CRITICAL RESULT CALLED TO, READ BACK BY AND VERIFIED WITH: R RUMBARGER,PHARMD AT 1738 06/04/17 BY L BENFIELD    Culture (A)  Final    STAPHYLOCOCCUS SPECIES (COAGULASE NEGATIVE) THE SIGNIFICANCE OF ISOLATING THIS ORGANISM FROM A SINGLE SET OF BLOOD CULTURES WHEN MULTIPLE SETS ARE DRAWN IS UNCERTAIN. PLEASE NOTIFY THE MICROBIOLOGY DEPARTMENT WITHIN ONE WEEK IF SPECIATION AND SENSITIVITIES ARE REQUIRED.    Report Status 06/06/2017 FINAL  Final  Blood Culture ID Panel (Reflexed)     Status: None   Collection Time: 06/03/17 12:33 PM  Result Value Ref Range Status   Enterococcus species NOT DETECTED NOT DETECTED Final   Listeria monocytogenes NOT DETECTED NOT DETECTED Final  Staphylococcus species NOT DETECTED NOT DETECTED Final   Staphylococcus aureus NOT DETECTED NOT DETECTED Final   Streptococcus species NOT DETECTED NOT DETECTED Final   Streptococcus agalactiae NOT DETECTED NOT DETECTED Final   Streptococcus pneumoniae NOT DETECTED NOT DETECTED Final   Streptococcus pyogenes NOT DETECTED NOT DETECTED Final   Acinetobacter baumannii NOT DETECTED NOT DETECTED Final   Enterobacteriaceae species NOT DETECTED NOT DETECTED Final   Enterobacter cloacae complex NOT DETECTED NOT DETECTED Final   Escherichia coli NOT DETECTED NOT DETECTED Final   Klebsiella oxytoca NOT DETECTED NOT DETECTED Final   Klebsiella pneumoniae NOT DETECTED NOT DETECTED Final    Proteus species NOT DETECTED NOT DETECTED Final   Serratia marcescens NOT DETECTED NOT DETECTED Final   Haemophilus influenzae NOT DETECTED NOT DETECTED Final   Neisseria meningitidis NOT DETECTED NOT DETECTED Final   Pseudomonas aeruginosa NOT DETECTED NOT DETECTED Final   Candida albicans NOT DETECTED NOT DETECTED Final   Candida glabrata NOT DETECTED NOT DETECTED Final   Candida krusei NOT DETECTED NOT DETECTED Final   Candida parapsilosis NOT DETECTED NOT DETECTED Final   Candida tropicalis NOT DETECTED NOT DETECTED Final  MRSA PCR Screening     Status: None   Collection Time: 06/03/17 11:20 PM  Result Value Ref Range Status   MRSA by PCR NEGATIVE NEGATIVE Final    Comment:        The GeneXpert MRSA Assay (FDA approved for NASAL specimens only), is one component of a comprehensive MRSA colonization surveillance program. It is not intended to diagnose MRSA infection nor to guide or monitor treatment for MRSA infections.      Labs: Basic Metabolic Panel:  Recent Labs Lab 06/03/17 1215 06/04/17 0235 06/06/17 0346  NA 135 139 139  K 4.1 3.5 3.2*  CL 102 107 107  CO2 23 25 24   GLUCOSE 172* 156* 115*  BUN 15 13 10   CREATININE 1.03 0.90 0.75  CALCIUM 8.6* 8.1* 8.1*   Liver Function Tests:  Recent Labs Lab 06/03/17 1215  AST 26  ALT 28  ALKPHOS 110  BILITOT 1.5*  PROT 6.8  ALBUMIN 2.8*   CBC:  Recent Labs Lab 06/03/17 1215 06/04/17 0235 06/06/17 0346  WBC 17.5* 10.8* 6.3  NEUTROABS 15.6*  --   --   HGB 14.2 12.7* 12.2*  HCT 42.6 39.1 35.2*  MCV 91.2 92.4 89.1  PLT 155 137* 156   BNP: BNP (last 3 results)  Recent Labs  06/03/17 1245  BNP 58.9   CBG:  Recent Labs Lab 06/05/17 2023 06/06/17 0016 06/06/17 0422 06/06/17 0754 06/06/17 1155  GLUCAP 140* 110* 115* 120* 202*    Signed:  Vassie Loll MD.  [facility] 06/06/2017, 3:11 PM

## 2017-06-06 NOTE — Progress Notes (Signed)
Attempted report x3 to [facility]

## 2017-06-06 NOTE — Progress Notes (Signed)
Attempted report x1 to [facility]

## 2017-06-06 NOTE — Progress Notes (Signed)
  Speech Language Pathology Treatment: Dysphagia  Patient Details Name: Matthew BoroughsRaymond J Roel MRN: [id number] DOB: 23-Aug-1941 Today's Date: 06/06/2017 Time: [id number] SLP Time Calculation (min) (ACUTE ONLY): 11 min  Assessment / Plan / Recommendation Clinical Impression  Pt refused MBS yesterday and ST continuing to observe/treat/educate re: safe swallow function. RN tech reported pt did not demonstrate outward difficulty during breakfast this am. Mild left anterior residue with graham cracker solid and removed with verbal cues. Delayed cough (one) during session. He is afebrile, lung sounds unchanged (clear;diminimshed). CXR 6/30 unchanged bibasilar streaky peribronchial opacities. Pt continues to deny swallow deficits. Does he need MBS?; would he participate or continue to refuse?   HPI HPI: 76 y.o.malewith a history of type 2 diabetes, GERD, hypertension, recent stroke with residual left-sided deficits and dysphagia. Patient is unable to communicate effectively and appears more confused than his baseline and is unable to provide history. He is brought to the hospital for evaluation of acute mental status change and fever. No palliating or provoking factors. Found with sepsis due to UTI. Pt has a history of CVA, has a PEG tube but also consumes a regualr diet and thin liquids per family and has wanted PEG tube removed. CXR shows unchaged beribronchial opacities from last CXR in May 2018 when pt was admitted for CVA, at that time characterized as bibasilar opacities which may be atelectasis, pneumonia or aspiration given distribution. Possible small pleural effusions.  Last MBS from 04/2017 showed severe oropharyngeal dysphagia with unsensed residuals, weakness. Pt lethargic during therapy attempts and PEG placed prior to d/c.       SLP Plan  Continue with current plan of care       Recommendations  Diet recommendations: Regular;Thin liquid Liquids provided via: Cup;Straw Medication  Administration: Whole meds with puree Supervision: Patient able to self feed;Intermittent supervision to cue for compensatory strategies Compensations: Slow rate;Small sips/bites Postural Changes and/or Swallow Maneuvers: Seated upright 90 degrees                Oral Care Recommendations: Oral care BID Follow up Recommendations:  (TBD) SLP Visit Diagnosis: Dysphagia, oropharyngeal phase (R13.12) Plan: Continue with current plan of care       GO                Royce MacadamiaLitaker, Aidaly Cordner Willis 06/06/2017, 12:10 PM  Breck CoonsLisa Willis Lonell FaceLitaker M.Ed [employer]SLP Pager [phone]

## 2017-06-06 NOTE — Progress Notes (Signed)
PT Cancellation Note  Patient Details Name: Kem BoroughsRaymond J Tanton MRN: [id number] DOB: 1941-10-30   Cancelled Treatment:    Reason Eval/Treat Not Completed: PT screened, no needs identified, will sign off;Other (comment).  Per CSW, pt imminently d/c back to SNF and PT assessment not needed to do so.  PT will defer further therapy to SNF at this time.   Thanks,   Rollene Rotundaebecca B. Akeyla Molden, PT, DPT [phone]   06/06/2017, 5:37 PM

## 2017-06-08 ENCOUNTER — Telehealth: Payer: Self-pay | Admitting: Diagnostic Neuroimaging

## 2017-06-08 LAB — CULTURE, BLOOD (ROUTINE X 2): Culture: NO GROWTH

## 2017-06-08 NOTE — Telephone Encounter (Signed)
Our office had tried to reach patient by phone his number is invalid.  I am unable to schedule the monitor appt. At this time.  Please have patient to call our office at [phone] to schedule the monitor appt.   Thank you  Lamar LaundrySonya

## 2017-06-08 NOTE — Telephone Encounter (Addendum)
I see from EPIC he was admitted for UTI-Sepsis and then [facility] (Rm [phone])  [phone].  I called and verified he was there.  Will let CV office know relating to cardiac event monitor.  Spoke to [employer]onya Pegram [facility].   [facility].  I will call them and give them [phone] [facility] for monitor.  I attempted to call and will call later as nursing line did not pick up.

## 2017-06-09 DIAGNOSIS — E876 Hypokalemia: Secondary | ICD-10-CM | POA: Diagnosis not present

## 2017-06-13 NOTE — Telephone Encounter (Signed)
I spoke to HaworthJessica in [facility] and gave her the phone number to Mervin KungSonya Pegram [facility] to set up for cardiac event monitor.  She stated she would call and get set up.

## 2017-06-16 DIAGNOSIS — Z8619 Personal history of other infectious and parasitic diseases: Secondary | ICD-10-CM | POA: Diagnosis not present

## 2017-07-05 ENCOUNTER — Encounter: Payer: Self-pay | Admitting: *Deleted

## 2017-07-18 ENCOUNTER — Telehealth: Payer: Self-pay | Admitting: Diagnostic Neuroimaging

## 2017-07-18 NOTE — Telephone Encounter (Signed)
I have reached out to [facility] scheduler to get pt scheduled for the monitor appt with no return call back. Please see the notes below. Order has been removed.   07/04/2017 lmom for the scheduler at [facility] to call and schedule pt monitor appt. stpegram 06/09/2017 [facility] stpegram 06/08/2017 pt phone number is not in service. will send a message to the order providers office to let them know we can not contact pt. stpegram 07/05/17 UNABLE TO REACH,VM NOT SET

## 2017-07-24 ENCOUNTER — Other Ambulatory Visit: Payer: Self-pay

## 2017-07-25 ENCOUNTER — Other Ambulatory Visit: Payer: Self-pay

## 2017-07-26 ENCOUNTER — Other Ambulatory Visit: Payer: Self-pay

## 2017-07-26 NOTE — Patient Outreach (Signed)
Outreach (attempt #3) to obtain mRs.  I called [facility] and I stated who I was and where I was calling from.  I asked to speak to the patient or his Nurse.  I was transferred and once transferred there was no answer and no voice mail.  I also called the home number listed and a recording picked up.  The recording stated that there was no voicemail sit up for this person.  This has been the case with all (3) call attempts.  mRs = 7

## 2017-07-27 DIAGNOSIS — I639 Cerebral infarction, unspecified: Secondary | ICD-10-CM | POA: Diagnosis not present

## 2017-07-27 DIAGNOSIS — Z0279 Encounter for issue of other medical certificate: Secondary | ICD-10-CM | POA: Diagnosis not present

## 2017-07-27 DIAGNOSIS — F0390 Unspecified dementia without behavioral disturbance: Secondary | ICD-10-CM | POA: Diagnosis not present

## 2017-07-27 NOTE — Addendum Note (Signed)
Addendum  created 07/27/17 1343 by Eshaan Titzer, MD   Sign clinical note    

## 2017-12-11 ENCOUNTER — Ambulatory Visit: Payer: Medicare HMO | Admitting: Neurology

## 2017-12-11 ENCOUNTER — Telehealth: Payer: Self-pay

## 2017-12-11 NOTE — Telephone Encounter (Signed)
Patient no show for appt today. 

## 2017-12-12 ENCOUNTER — Encounter: Payer: Self-pay | Admitting: Neurology

## 2018-02-09 ENCOUNTER — Other Ambulatory Visit (HOSPITAL_COMMUNITY): Payer: Self-pay | Admitting: Family Medicine

## 2018-02-09 DIAGNOSIS — R1310 Dysphagia, unspecified: Secondary | ICD-10-CM

## 2018-02-14 ENCOUNTER — Encounter (HOSPITAL_COMMUNITY): Payer: Self-pay

## 2018-02-14 ENCOUNTER — Ambulatory Visit (HOSPITAL_COMMUNITY): Payer: Medicare HMO

## 2018-02-23 ENCOUNTER — Inpatient Hospital Stay (HOSPITAL_COMMUNITY): Admission: RE | Admit: 2018-02-23 | Payer: Medicare HMO | Source: Ambulatory Visit

## 2018-03-13 IMAGING — MR MR HEAD W/O CM
9 of 10 series · 34 of 48 positions shown · non-contrast
Comparison: CT HEAD April 01, 2017 at 1002 hours

CLINICAL DATA: Follow-up RIGHT MCA stroke.

EXAM:
MRI HEAD WITHOUT CONTRAST
TECHNIQUE: Multiplanar, multiecho pulse sequences of the brain and surrounding
structures were obtained without intravenous contrast.

[Series 3: FLAIR · sagittal · 5.0mm · 0.47mm/px · 2 of 25 slices shown (1 of 2)]
[im 1/25]
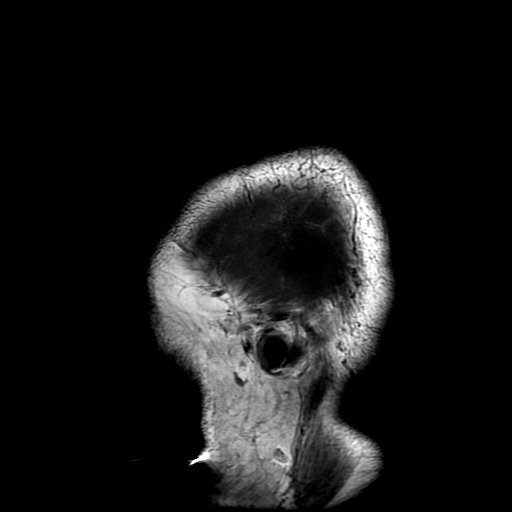
[im 25/25]
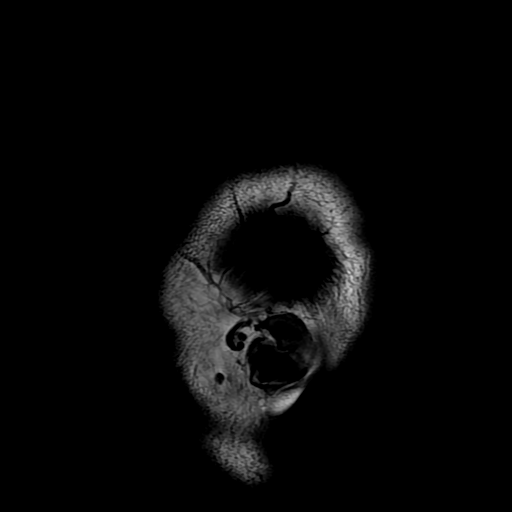

[Series 5: DWI · axial · 3.0mm · 0.94mm/px · z∈[-37,+109]mm · 8 of 100 slices shown (1 of 2)]
[im 1/100]
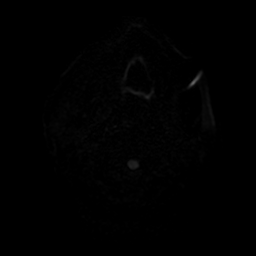
[im 12/100]
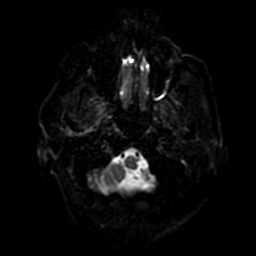
[im 34/100]
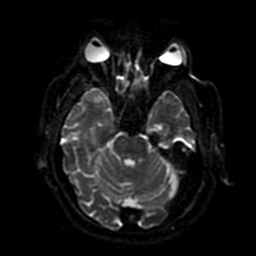
[im 45/100]
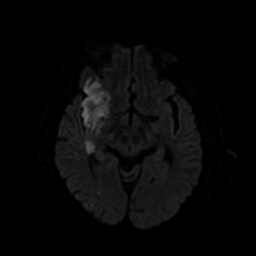
[im 56/100]
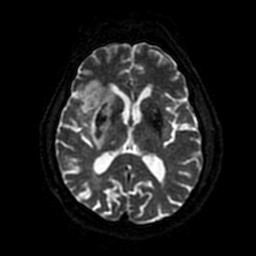
[im 67/100]
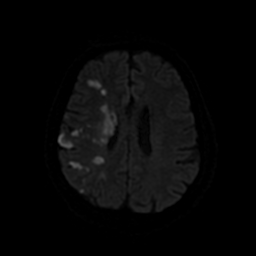
[im 89/100]
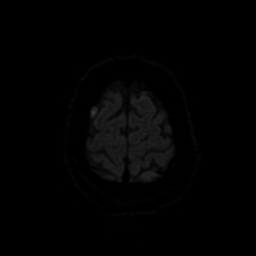
[im 100/100]
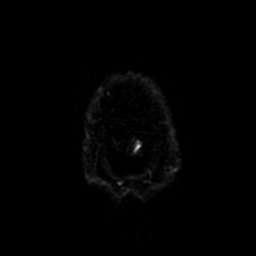

[Series 7: T2 · axial · 5.0mm · 0.47mm/px · z∈[-36,+107]mm · 2 of 25 slices shown (1 of 2)]
[im 1/25]
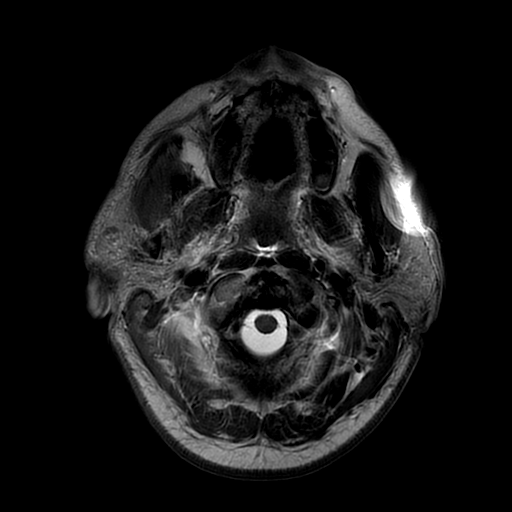
[im 25/25]
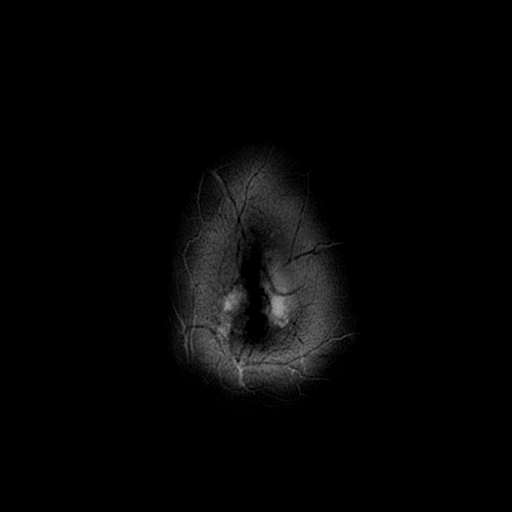

[Series 8: FLAIR · axial · 5.0mm · 0.47mm/px · z∈[-36,+107]mm · 2 of 25 slices shown (2 of 2)]
[im 1/25]
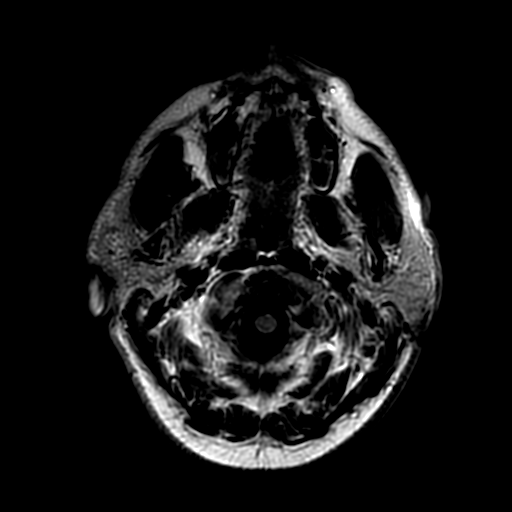
[im 25/25]
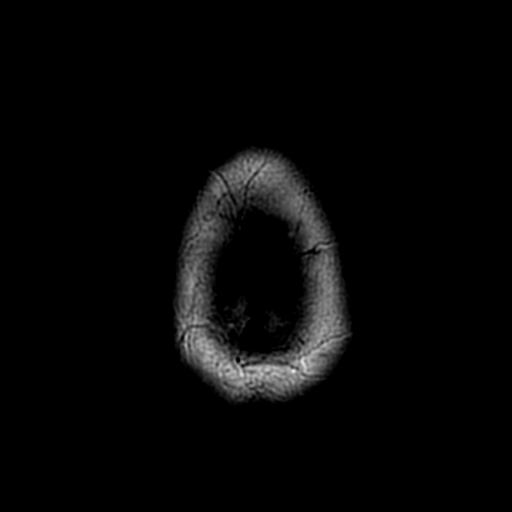

[Series 9: DWI · coronal · 4.0mm · 0.94mm/px · 6 of 68 slices shown (2 of 2)]
[im 1/68]
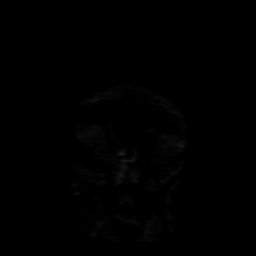
[im 14/68]
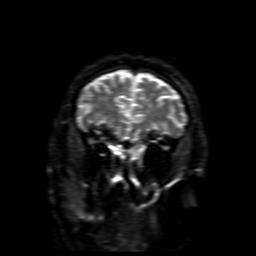
[im 27/68]
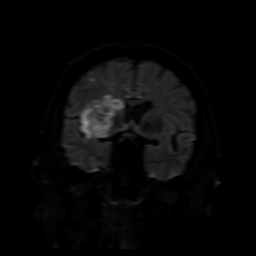
[im 41/68]
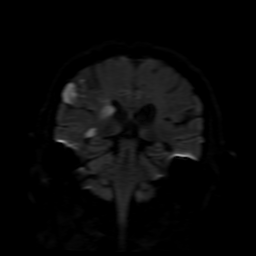
[im 54/68]
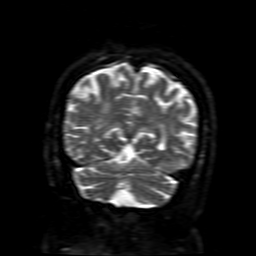
[im 68/68]
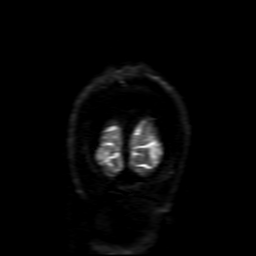

[Series 10: (person_name) · axial · 3.0mm · 0.47mm/px · z∈[-37,+12]mm · 3 of 100 slices shown]
[im 1/100]
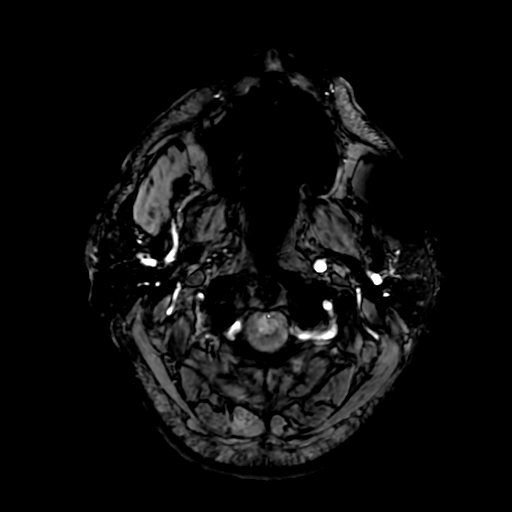
[im 12/100]
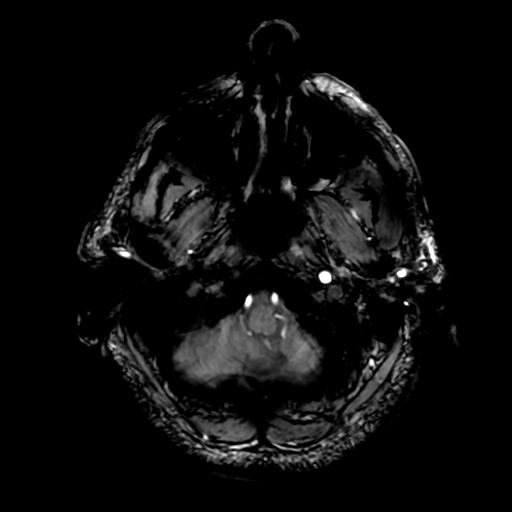
[im 34/100]
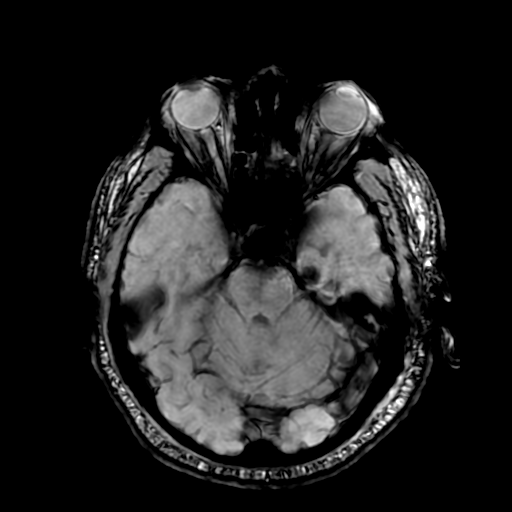

[Series 12: T2 · coronal · 5.0mm · 0.47mm/px · 3 of 28 slices shown (2 of 2)]
[im 1/28]
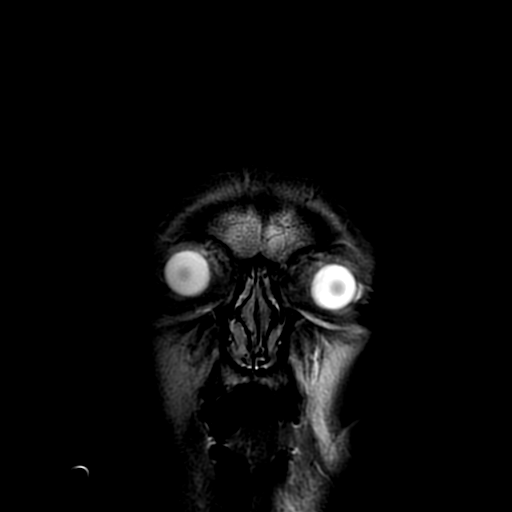
[im 14/28]
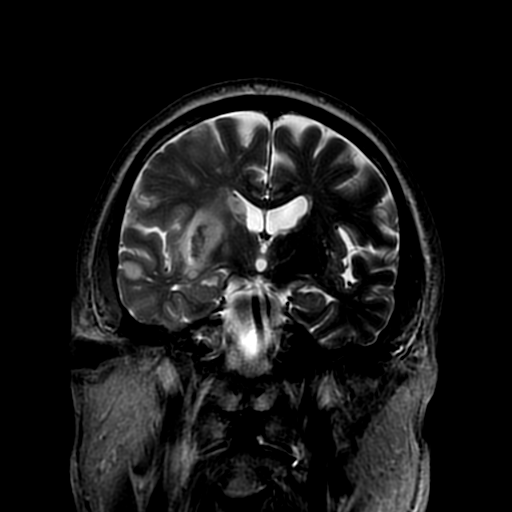
[im 28/28]
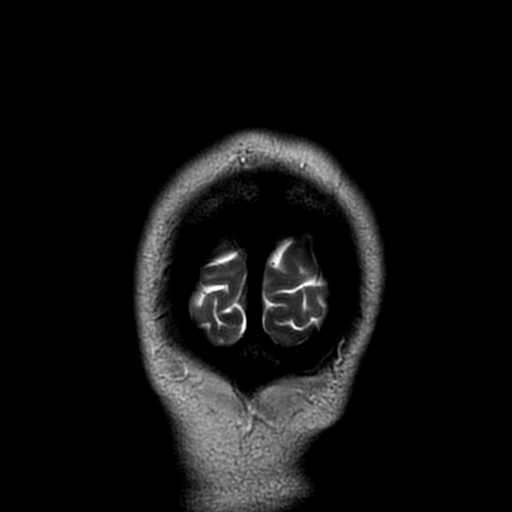

[Series 550: ADC · axial · 3.0mm · 0.94mm/px · z∈[-37,+109]mm · 5 of 50 slices shown (1 of 2)]
[im 1/50]
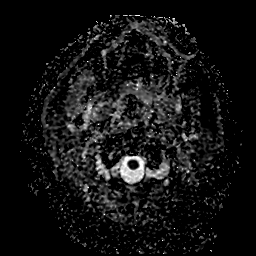
[im 13/50]
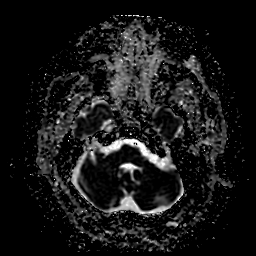
[im 25/50]
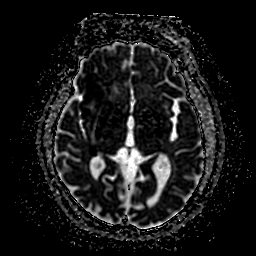
[im 37/50]
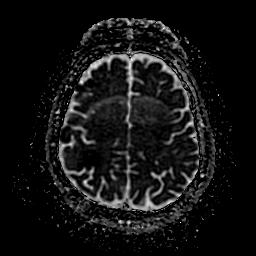
[im 50/50]
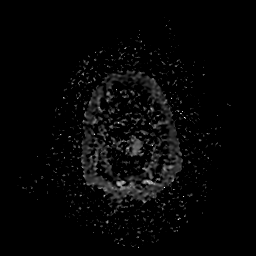

[Series 950: ADC · coronal · 4.0mm · 0.94mm/px · 3 of 34 slices shown (2 of 2)]
[im 1/34]
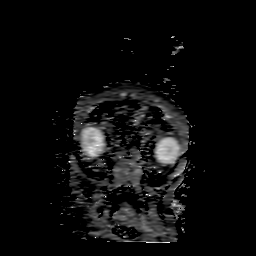
[im 17/34]
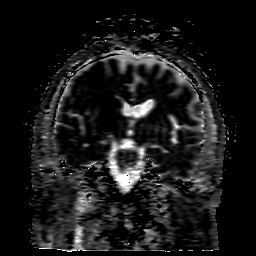
[im 34/34]
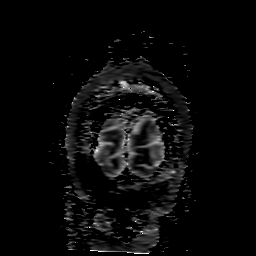

[34 of 48 positions shown; findings below may reference images not displayed]

FINDINGS: BRAIN: Confluent reduced diffusion RIGHT frontal lobes with smaller
foci of reduced diffusion RIGHT parietal and temporal lobe,
intermediate to low ADC values. Susceptibility artifact RIGHT basal
ganglia and insula corresponding to CT findings without further
propagation by MRI. FLAIR T2 hyperintense signal within areas of
acute infarction with regional mass effect, no significant midline
shift. Partial effacement of the RIGHT lateral ventricle without
hydrocephalus. Patchy additional supratentorial white matter FLAIR
T2 hyperintensities compatible with mild chronic small vessel
ischemic disease. No abnormal extra-axial fluid collections.

VASCULAR: Normal major intracranial vascular flow voids present at
skull base.

SKULL AND UPPER CERVICAL SPINE: No abnormal sellar expansion. No
suspicious calvarial bone marrow signal. Craniocervical junction
maintained.

SINUSES/ORBITS: Moderate paranasal sinus mucosal thickening. Mastoid
air cells are well aerated. The included ocular globes and orbital
contents are non-suspicious.

OTHER: Life-support lines in place.
IMPRESSION: Acute large RIGHT MCA territory infarct with similar petechial
hemorrhage.

Otherwise negative noncontrast MRI head for age.

## 2018-03-14 IMAGING — CR DG CHEST 1V PORT
1 series · 1 of 1 positions shown · non-contrast
Comparison: 04/01/2017.

CLINICAL DATA: Acute cerebral infarction.  LEFT-sided weakness.

EXAM:
PORTABLE CHEST 1 VIEW

[AP]
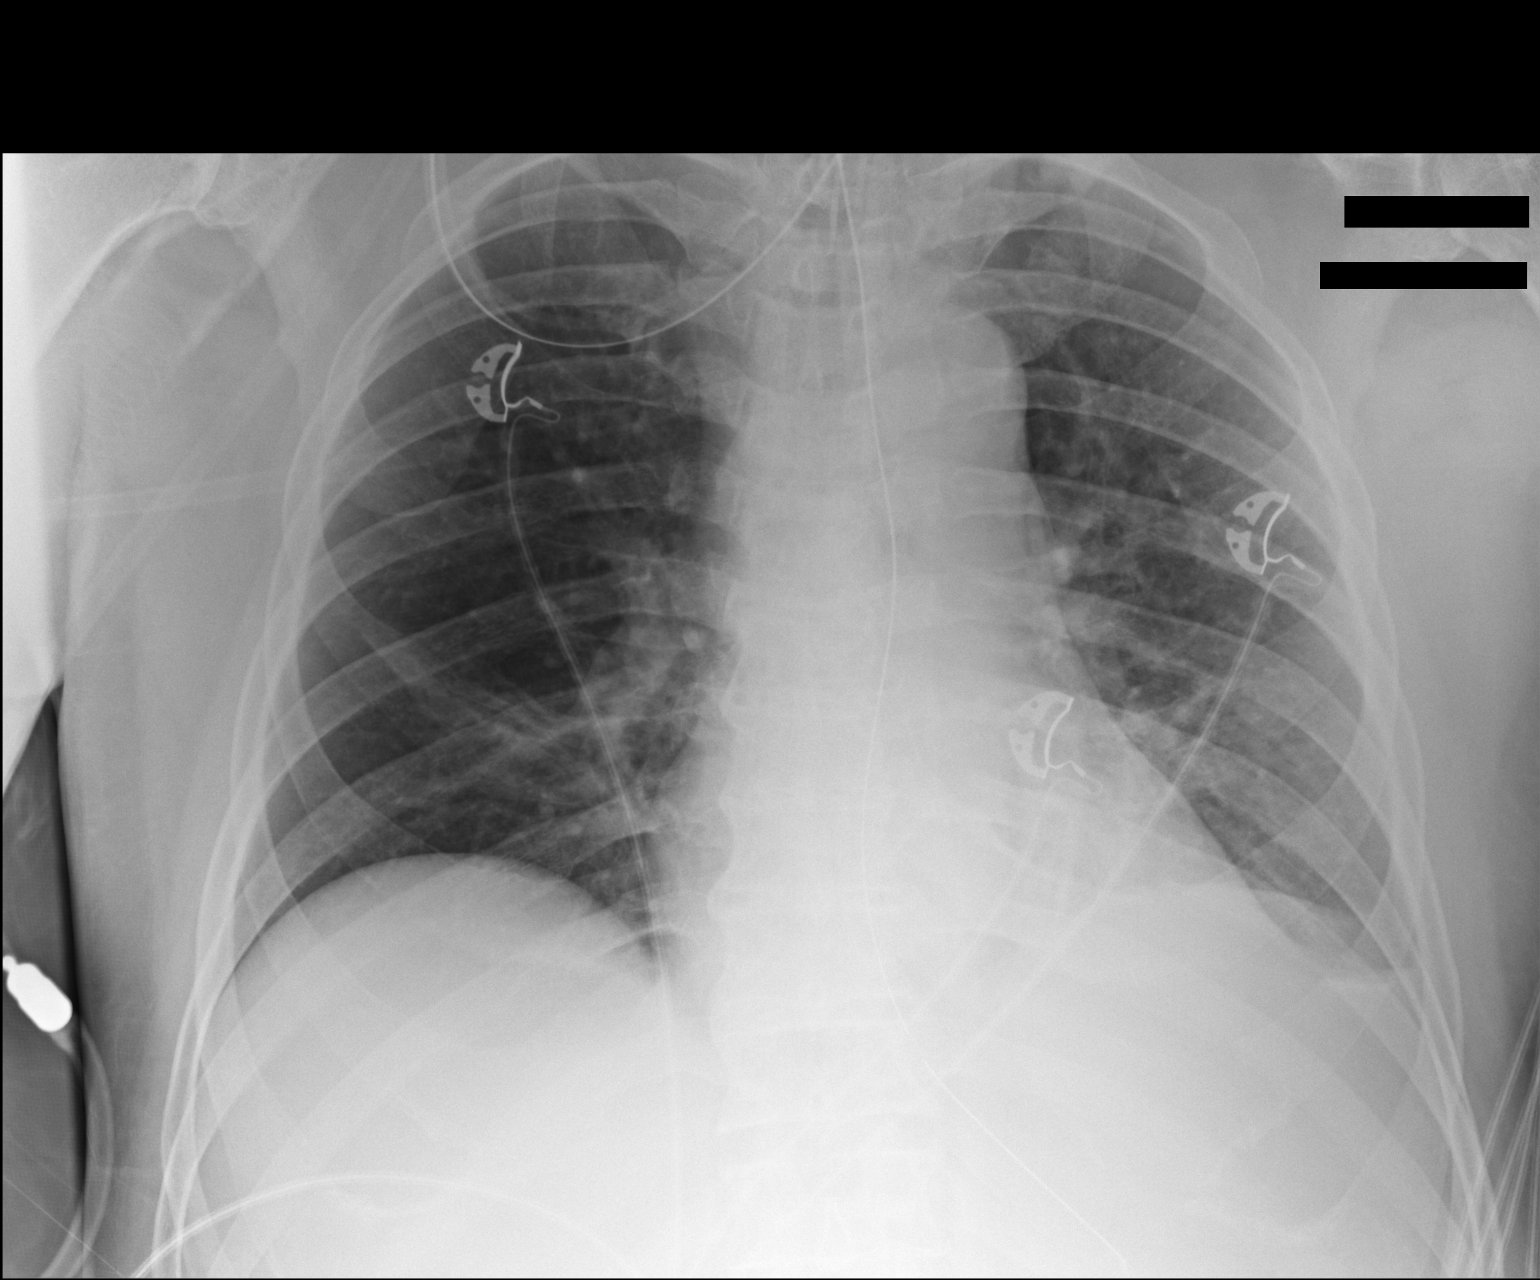

[1 of 1 positions shown; findings below may reference images not displayed]

FINDINGS: Support tubes and lines stable. Bibasilar atelectasis, worse on the
LEFT. Increasing small LEFT effusion. No pneumothorax.
IMPRESSION: Slight worsening aeration.

## 2018-03-21 IMAGING — CR DG CHEST 1V PORT
1 series · 1 of 1 positions shown · non-contrast
Comparison: 04/02/2017

CLINICAL DATA: Pleural effusion on the right.  Fever.

EXAM:
PORTABLE CHEST 1 VIEW

[AP]
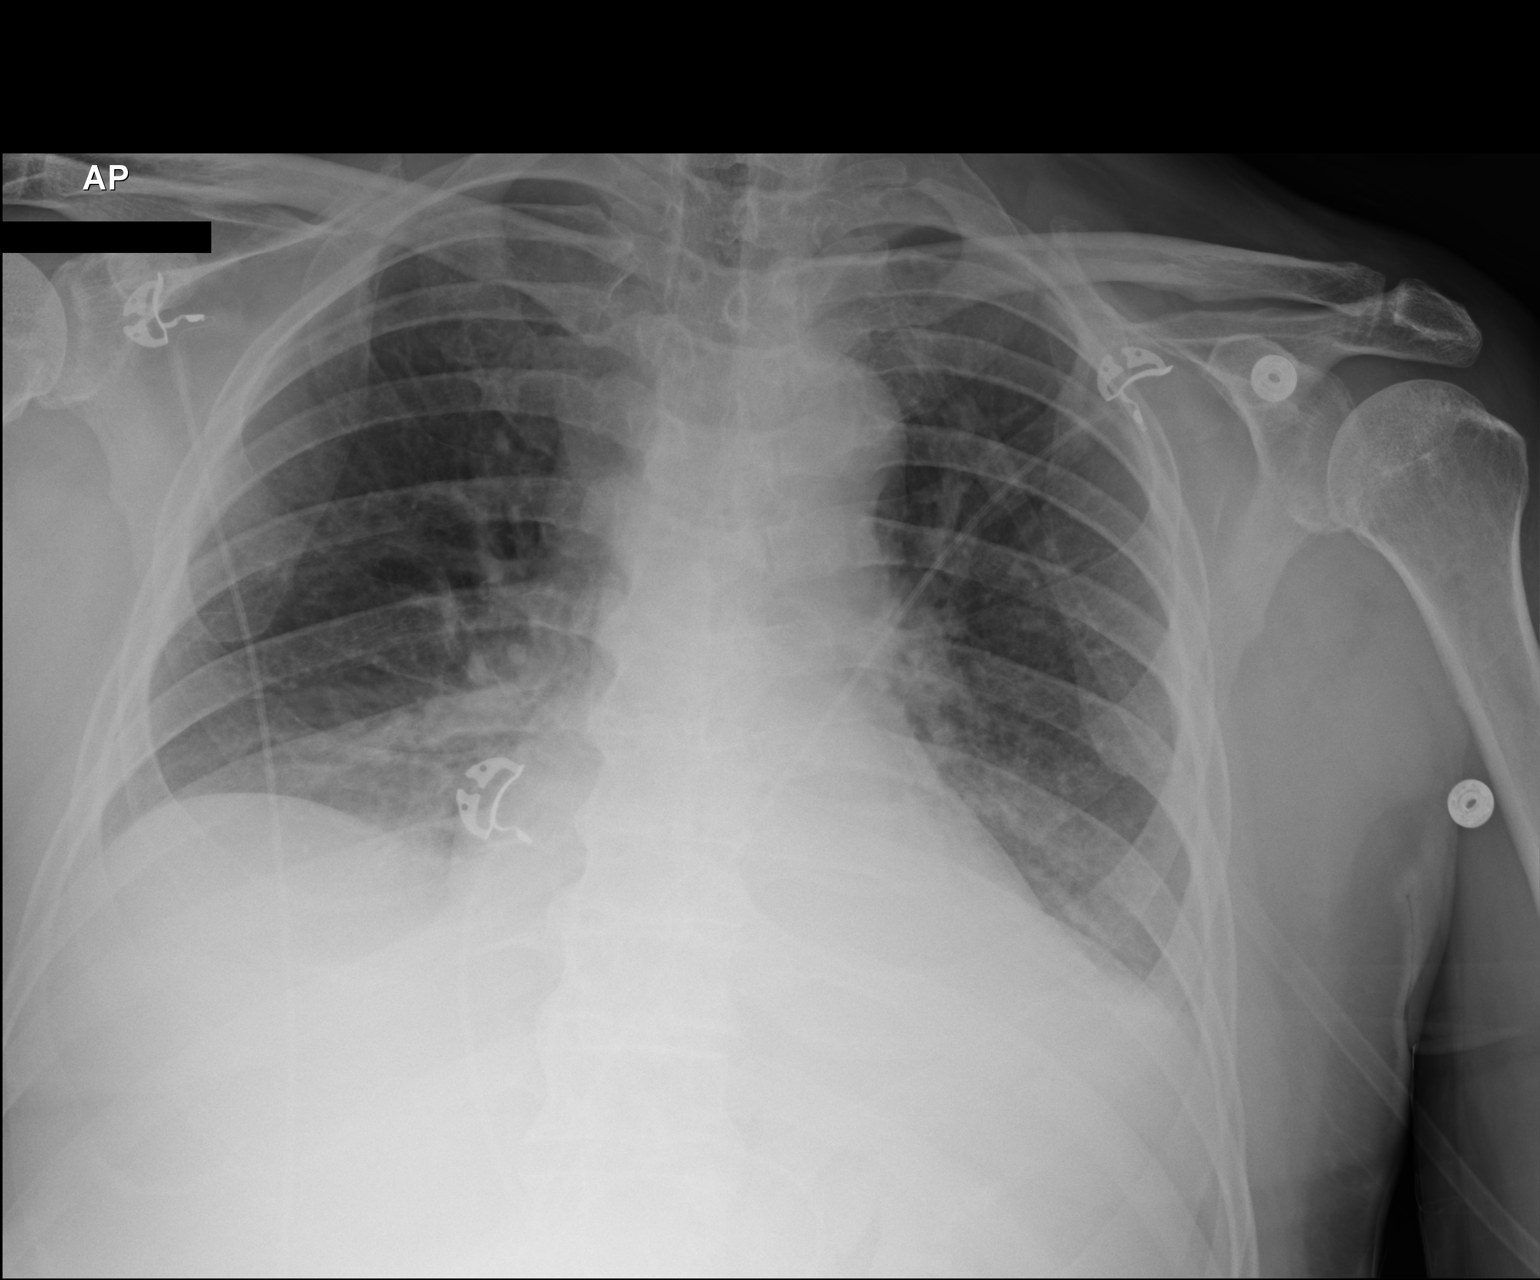

[1 of 1 positions shown; findings below may reference images not displayed]

FINDINGS: Removed nasogastric tube.

Haziness of the bilateral lower chest without air bronchogram or
Kerley line. Normal heart size and mediastinal contours.
IMPRESSION: Symmetric opacities at the bases which could be atelectasis or
pneumonia. If present, pleural effusions would be small.

## 2018-03-23 IMAGING — CR DG CHEST 1V PORT
1 series · 1 of 1 positions shown · non-contrast
Comparison: Chest radiograph 04/09/2017.

CLINICAL DATA: Leukocytosis.

EXAM:
PORTABLE CHEST 1 VIEW

[AP]
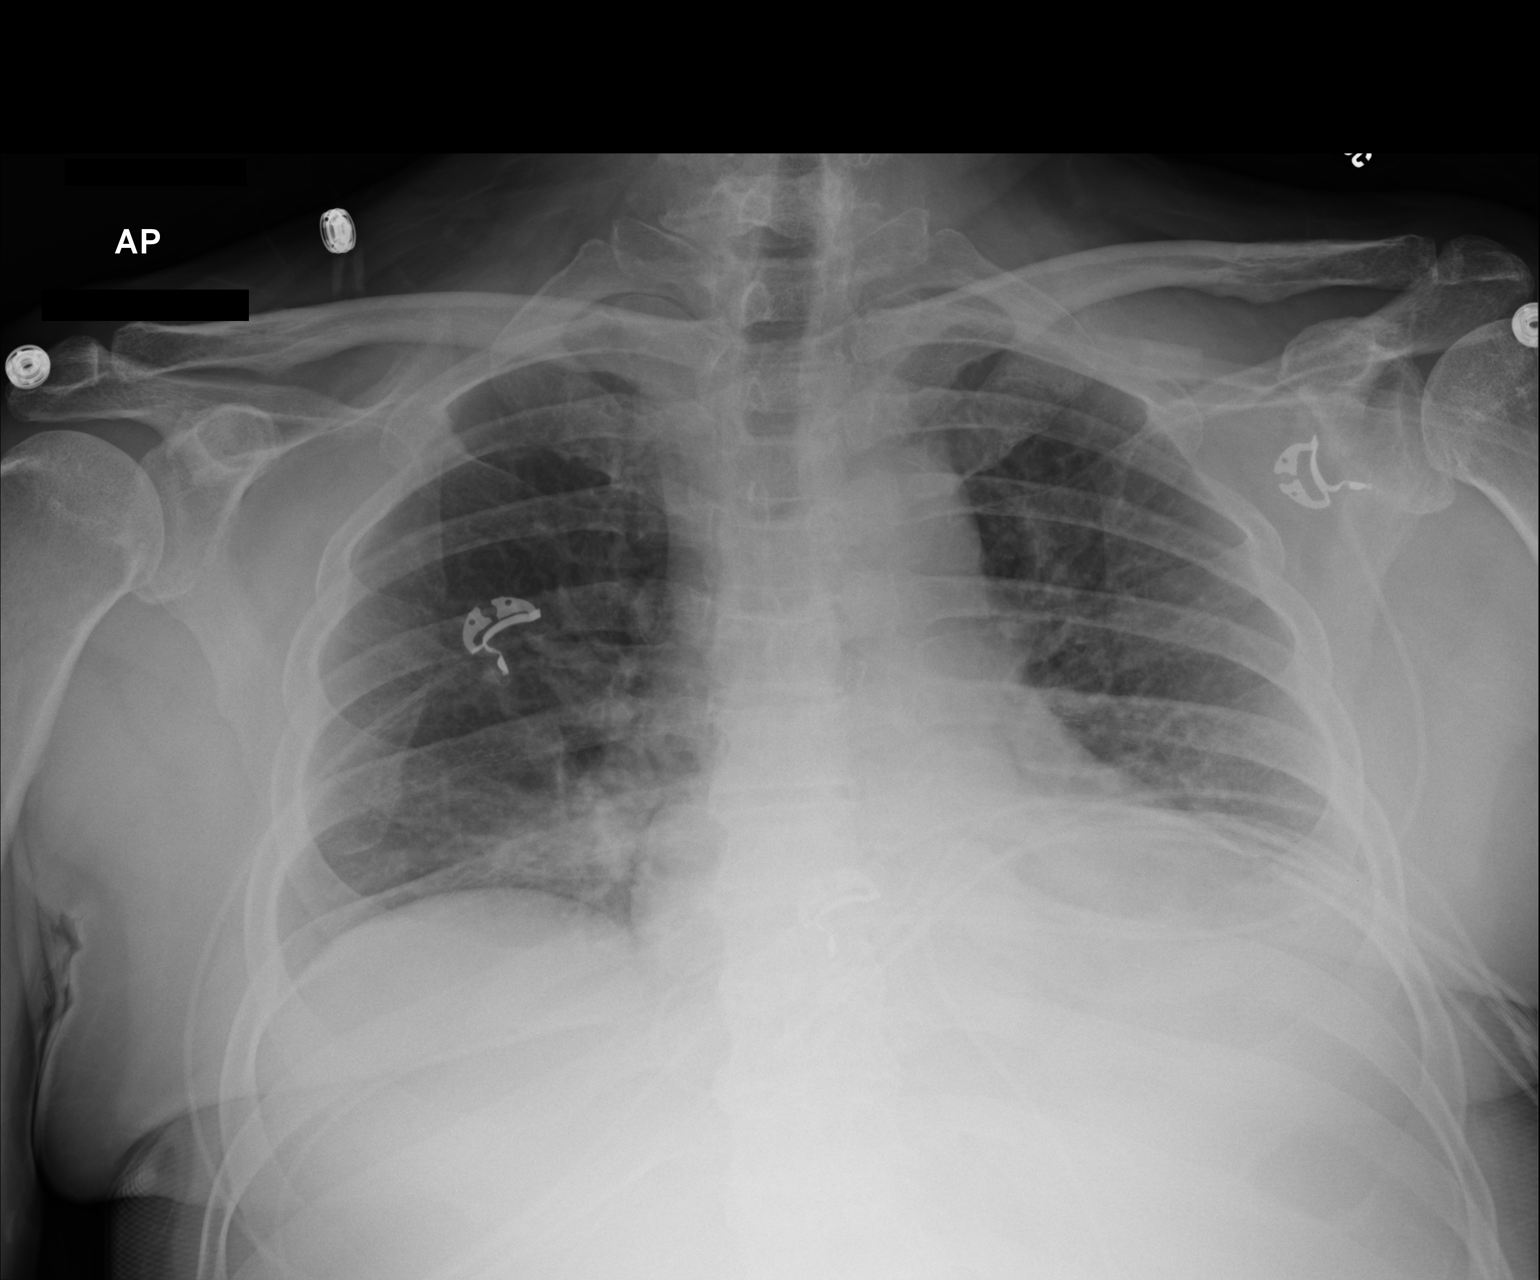

[1 of 1 positions shown; findings below may reference images not displayed]

FINDINGS: Low lung volumes persist. Unchanged bibasilar opacities. Unchanged
heart size and mediastinal contours. No pulmonary edema or
pneumothorax.
IMPRESSION: Unchanged bibasilar opacities which may be atelectasis, pneumonia or
aspiration given distribution. Possible small pleural effusions.

## 2018-04-17 ENCOUNTER — Inpatient Hospital Stay (HOSPITAL_COMMUNITY): Payer: Medicare HMO | Admitting: Certified Registered Nurse Anesthetist

## 2018-04-17 ENCOUNTER — Emergency Department (HOSPITAL_COMMUNITY): Payer: Medicare HMO

## 2018-04-17 ENCOUNTER — Inpatient Hospital Stay (HOSPITAL_COMMUNITY)
Admission: EM | Admit: 2018-04-17 | Discharge: 2018-05-01 | DRG: 854 | Disposition: A | Discharge status: Hospice | Payer: Medicare HMO | Attending: Internal Medicine | Admitting: Internal Medicine

## 2018-04-17 ENCOUNTER — Inpatient Hospital Stay (HOSPITAL_COMMUNITY): Payer: Medicare HMO

## 2018-04-17 ENCOUNTER — Encounter (HOSPITAL_COMMUNITY)
Admission: EM | Disposition: A | Discharge status: Hospice | Payer: Self-pay | Source: Home / Self Care | Attending: Internal Medicine

## 2018-04-17 ENCOUNTER — Encounter (HOSPITAL_COMMUNITY): Payer: Self-pay | Admitting: Emergency Medicine

## 2018-04-17 DIAGNOSIS — I639 Cerebral infarction, unspecified: Secondary | ICD-10-CM | POA: Diagnosis not present

## 2018-04-17 DIAGNOSIS — K56600 Partial intestinal obstruction, unspecified as to cause: Secondary | ICD-10-CM

## 2018-04-17 DIAGNOSIS — Z4659 Encounter for fitting and adjustment of other gastrointestinal appliance and device: Secondary | ICD-10-CM | POA: Diagnosis not present

## 2018-04-17 DIAGNOSIS — F209 Schizophrenia, unspecified: Secondary | ICD-10-CM | POA: Diagnosis present

## 2018-04-17 DIAGNOSIS — I10 Essential (primary) hypertension: Secondary | ICD-10-CM | POA: Diagnosis not present

## 2018-04-17 DIAGNOSIS — G20 Parkinson's disease: Secondary | ICD-10-CM | POA: Diagnosis not present

## 2018-04-17 DIAGNOSIS — I69354 Hemiplegia and hemiparesis following cerebral infarction affecting left non-dominant side: Secondary | ICD-10-CM | POA: Diagnosis not present

## 2018-04-17 DIAGNOSIS — H9190 Unspecified hearing loss, unspecified ear: Secondary | ICD-10-CM | POA: Diagnosis present

## 2018-04-17 DIAGNOSIS — R1312 Dysphagia, oropharyngeal phase: Secondary | ICD-10-CM | POA: Diagnosis not present

## 2018-04-17 DIAGNOSIS — Z87891 Personal history of nicotine dependence: Secondary | ICD-10-CM

## 2018-04-17 DIAGNOSIS — E118 Type 2 diabetes mellitus with unspecified complications: Secondary | ICD-10-CM | POA: Diagnosis not present

## 2018-04-17 DIAGNOSIS — Z7902 Long term (current) use of antithrombotics/antiplatelets: Secondary | ICD-10-CM

## 2018-04-17 DIAGNOSIS — Z515 Encounter for palliative care: Secondary | ICD-10-CM | POA: Diagnosis not present

## 2018-04-17 DIAGNOSIS — E876 Hypokalemia: Secondary | ICD-10-CM | POA: Diagnosis not present

## 2018-04-17 DIAGNOSIS — G20A1 Parkinson's disease without dyskinesia, without mention of fluctuations: Secondary | ICD-10-CM | POA: Diagnosis present

## 2018-04-17 DIAGNOSIS — A419 Sepsis, unspecified organism: Principal | ICD-10-CM | POA: Diagnosis present

## 2018-04-17 DIAGNOSIS — I69398 Other sequelae of cerebral infarction: Secondary | ICD-10-CM | POA: Diagnosis not present

## 2018-04-17 DIAGNOSIS — K920 Hematemesis: Secondary | ICD-10-CM | POA: Diagnosis present

## 2018-04-17 DIAGNOSIS — K219 Gastro-esophageal reflux disease without esophagitis: Secondary | ICD-10-CM | POA: Diagnosis present

## 2018-04-17 DIAGNOSIS — R066 Hiccough: Secondary | ICD-10-CM | POA: Diagnosis not present

## 2018-04-17 DIAGNOSIS — Q433 Congenital malformations of intestinal fixation: Secondary | ICD-10-CM

## 2018-04-17 DIAGNOSIS — F0280 Dementia in other diseases classified elsewhere without behavioral disturbance: Secondary | ICD-10-CM | POA: Diagnosis present

## 2018-04-17 DIAGNOSIS — Z789 Other specified health status: Secondary | ICD-10-CM | POA: Diagnosis not present

## 2018-04-17 DIAGNOSIS — Z66 Do not resuscitate: Secondary | ICD-10-CM | POA: Diagnosis not present

## 2018-04-17 DIAGNOSIS — R6520 Severe sepsis without septic shock: Secondary | ICD-10-CM | POA: Diagnosis not present

## 2018-04-17 DIAGNOSIS — Z452 Encounter for adjustment and management of vascular access device: Secondary | ICD-10-CM

## 2018-04-17 DIAGNOSIS — G43A0 Cyclical vomiting, not intractable: Secondary | ICD-10-CM | POA: Diagnosis not present

## 2018-04-17 DIAGNOSIS — R945 Abnormal results of liver function studies: Secondary | ICD-10-CM

## 2018-04-17 DIAGNOSIS — E860 Dehydration: Secondary | ICD-10-CM | POA: Diagnosis present

## 2018-04-17 DIAGNOSIS — Z79899 Other long term (current) drug therapy: Secondary | ICD-10-CM

## 2018-04-17 DIAGNOSIS — E119 Type 2 diabetes mellitus without complications: Secondary | ICD-10-CM | POA: Diagnosis present

## 2018-04-17 DIAGNOSIS — Z7189 Other specified counseling: Secondary | ICD-10-CM | POA: Diagnosis not present

## 2018-04-17 DIAGNOSIS — E872 Acidosis: Secondary | ICD-10-CM | POA: Diagnosis present

## 2018-04-17 DIAGNOSIS — R7989 Other specified abnormal findings of blood chemistry: Secondary | ICD-10-CM

## 2018-04-17 DIAGNOSIS — K567 Ileus, unspecified: Secondary | ICD-10-CM | POA: Diagnosis not present

## 2018-04-17 DIAGNOSIS — Z794 Long term (current) use of insulin: Secondary | ICD-10-CM

## 2018-04-17 DIAGNOSIS — E785 Hyperlipidemia, unspecified: Secondary | ICD-10-CM | POA: Diagnosis present

## 2018-04-17 DIAGNOSIS — R112 Nausea with vomiting, unspecified: Secondary | ICD-10-CM | POA: Diagnosis not present

## 2018-04-17 HISTORY — DX: Hemiplegia, unspecified affecting right dominant side: G81.91

## 2018-04-17 HISTORY — PX: LAPAROTOMY: SHX154

## 2018-04-17 HISTORY — PX: LAPAROSCOPY: SHX197

## 2018-04-17 HISTORY — DX: Parkinson's disease: G20

## 2018-04-17 HISTORY — DX: Parkinson's disease without dyskinesia, without mention of fluctuations: G20.A1

## 2018-04-17 LAB — CBC
HCT: 37.1 % — ABNORMAL LOW (ref 39.0–52.0)
Hemoglobin: 12 g/dL — ABNORMAL LOW (ref 13.0–17.0)
MCH: 31.1 pg (ref 26.0–34.0)
MCHC: 32.3 g/dL (ref 30.0–36.0)
MCV: 96.1 fL (ref 78.0–100.0)
Platelets: 159 10*3/uL (ref 150–400)
RBC: 3.86 MIL/uL — ABNORMAL LOW (ref 4.22–5.81)
RDW: 13.9 % (ref 11.5–15.5)
WBC: 14.4 10*3/uL — ABNORMAL HIGH (ref 4.0–10.5)

## 2018-04-17 LAB — COMPREHENSIVE METABOLIC PANEL
ALBUMIN: 4.2 g/dL (ref 3.5–5.0)
ALK PHOS: 147 U/L — AB (ref 38–126)
ALT: 87 U/L — AB (ref 17–63)
ANION GAP: 14 (ref 5–15)
AST: 46 U/L — ABNORMAL HIGH (ref 15–41)
BUN: 14 mg/dL (ref 6–20)
CALCIUM: 9.6 mg/dL (ref 8.9–10.3)
CO2: 24 mmol/L (ref 22–32)
Chloride: 103 mmol/L (ref 101–111)
Creatinine, Ser: 1.05 mg/dL (ref 0.61–1.24)
GFR calc non Af Amer: >60 mL/min (ref >60)
GLUCOSE: 166 mg/dL — AB (ref 65–99)
Potassium: 4.4 mmol/L (ref 3.5–5.1)
SODIUM: 141 mmol/L (ref 135–145)
Total Bilirubin: 1.4 mg/dL — ABNORMAL HIGH (ref 0.3–1.2)
Total Protein: 7.5 g/dL (ref 6.5–8.1)

## 2018-04-17 LAB — URINALYSIS, ROUTINE W REFLEX MICROSCOPIC
Bilirubin Urine: NEGATIVE
GLUCOSE, UA: NEGATIVE mg/dL
Ketones, ur: 20 mg/dL — AB
Leukocytes, UA: NEGATIVE
NITRITE: NEGATIVE
PH: 5 (ref 5.0–8.0)
PROTEIN: 100 mg/dL — AB
Specific Gravity, Urine: 1.032 — ABNORMAL HIGH (ref 1.005–1.030)

## 2018-04-17 LAB — ABO/RH: ABO/RH(D): O POS

## 2018-04-17 LAB — CBC WITH DIFFERENTIAL/PLATELET
BASOS ABS: 0 10*3/uL (ref 0.0–0.1)
Basophils Relative: 0 %
EOS ABS: 0 10*3/uL (ref 0.0–0.7)
Eosinophils Relative: 0 %
HCT: 56.2 % — ABNORMAL HIGH (ref 39.0–52.0)
HEMOGLOBIN: 19.2 g/dL — AB (ref 13.0–17.0)
LYMPHS PCT: 4 %
Lymphs Abs: 1.1 10*3/uL (ref 0.7–4.0)
MCH: 32.7 pg (ref 26.0–34.0)
MCHC: 34.2 g/dL (ref 30.0–36.0)
MCV: 95.7 fL (ref 78.0–100.0)
MONO ABS: 1.7 10*3/uL — AB (ref 0.1–1.0)
Monocytes Relative: 6 %
NEUTROS PCT: 90 %
Neutro Abs: 25 10*3/uL — ABNORMAL HIGH (ref 1.7–7.7)
PLATELETS: 241 10*3/uL (ref 150–400)
RBC: 5.87 MIL/uL — ABNORMAL HIGH (ref 4.22–5.81)
RDW: 14.3 % (ref 11.5–15.5)
WBC: 27.8 10*3/uL — AB (ref 4.0–10.5)

## 2018-04-17 LAB — TYPE AND SCREEN
ABO/RH(D): O POS
Antibody Screen: NEGATIVE

## 2018-04-17 LAB — LACTIC ACID, PLASMA
LACTIC ACID, VENOUS: 4.9 mmol/L — AB (ref 0.5–1.9)
Lactic Acid, Venous: 7 mmol/L (ref 0.5–1.9)
Lactic Acid, Venous: 5.8 mmol/L (ref 0.5–1.9)

## 2018-04-17 LAB — CBG MONITORING, ED
GLUCOSE-CAPILLARY: 161 mg/dL — AB (ref 65–99)
Glucose-Capillary: 181 mg/dL — ABNORMAL HIGH (ref 65–99)
Glucose-Capillary: 183 mg/dL — ABNORMAL HIGH (ref 65–99)

## 2018-04-17 LAB — GLUCOSE, CAPILLARY
Glucose-Capillary: 168 mg/dL — ABNORMAL HIGH (ref 65–99)
Glucose-Capillary: 141 mg/dL — ABNORMAL HIGH (ref 65–99)

## 2018-04-17 LAB — LIPASE, BLOOD: Lipase: 25 U/L (ref 11–51)

## 2018-04-17 LAB — POC OCCULT BLOOD, ED: Fecal Occult Bld: NEGATIVE

## 2018-04-17 LAB — I-STAT CG4 LACTIC ACID, ED
Lactic Acid, Venous: 4.39 mmol/L (ref 0.5–1.9)
Lactic Acid, Venous: 4.96 mmol/L (ref 0.5–1.9)

## 2018-04-17 SURGERY — LAPAROSCOPY, DIAGNOSTIC
Anesthesia: General

## 2018-04-17 MED ORDER — SODIUM CHLORIDE 0.9 % IV SOLN
INTRAVENOUS | Status: DC
  Administered 2018-04-17 – 2018-04-19 (×5): via INTRAVENOUS

## 2018-04-17 MED ORDER — KETOROLAC TROMETHAMINE 30 MG/ML IJ SOLN
30.0000 mg | Freq: Once | INTRAMUSCULAR | Status: AC | PRN
  Administered 2018-04-17: 30 mg via INTRAVENOUS

## 2018-04-17 MED ORDER — SUGAMMADEX SODIUM 200 MG/2ML IV SOLN
INTRAVENOUS | Status: DC | PRN
  Administered 2018-04-17: 200 mg via INTRAVENOUS

## 2018-04-17 MED ORDER — LACTATED RINGERS IV SOLN
INTRAVENOUS | Status: DC | PRN
  Administered 2018-04-17: 15:00:00 via INTRAVENOUS

## 2018-04-17 MED ORDER — ATORVASTATIN CALCIUM 40 MG PO TABS
40.0000 mg | ORAL_TABLET | Freq: Every day | ORAL | Status: DC
  Filled 2018-04-17: qty 1

## 2018-04-17 MED ORDER — IOHEXOL 300 MG/ML  SOLN
100.0000 mL | Freq: Once | INTRAMUSCULAR | Status: AC | PRN
  Administered 2018-04-17: 100 mL via INTRAVENOUS

## 2018-04-17 MED ORDER — ONDANSETRON HCL 4 MG/2ML IJ SOLN
4.0000 mg | Freq: Four times a day (QID) | INTRAMUSCULAR | Status: DC | PRN
  Administered 2018-04-23 – 2018-05-01 (×3): 4 mg via INTRAVENOUS
  Filled 2018-04-17 (×3): qty 2

## 2018-04-17 MED ORDER — ONDANSETRON HCL 4 MG PO TABS: 4.0000 mg | ORAL_TABLET | Freq: Four times a day (QID) | ORAL | Status: DC | PRN

## 2018-04-17 MED ORDER — ALBUMIN HUMAN 5 % IV SOLN
INTRAVENOUS | Status: DC | PRN
  Administered 2018-04-17 (×2): via INTRAVENOUS

## 2018-04-17 MED ORDER — PHENYLEPHRINE HCL 10 MG/ML IJ SOLN
INTRAVENOUS | Status: DC | PRN
  Administered 2018-04-17: 60 ug/min via INTRAVENOUS

## 2018-04-17 MED ORDER — MORPHINE SULFATE (PF) 2 MG/ML IV SOLN
1.0000 mg | INTRAVENOUS | Status: DC | PRN
  Administered 2018-04-18 – 2018-04-30 (×5): 2 mg via INTRAVENOUS
  Filled 2018-04-17 (×5): qty 1

## 2018-04-17 MED ORDER — SODIUM CHLORIDE 0.9 % IV BOLUS
1000.0000 mL | Freq: Once | INTRAVENOUS | Status: AC
  Administered 2018-04-17: 1000 mL via INTRAVENOUS

## 2018-04-17 MED ORDER — SODIUM CHLORIDE 0.9 % IV BOLUS (SEPSIS)
1000.0000 mL | Freq: Once | INTRAVENOUS | Status: AC
  Administered 2018-04-17: 1000 mL via INTRAVENOUS

## 2018-04-17 MED ORDER — SUCCINYLCHOLINE CHLORIDE 20 MG/ML IJ SOLN
INTRAMUSCULAR | Status: DC | PRN
  Administered 2018-04-17: 120 mg via INTRAVENOUS

## 2018-04-17 MED ORDER — ROCURONIUM BROMIDE 100 MG/10ML IV SOLN
INTRAVENOUS | Status: DC | PRN
  Administered 2018-04-17: 40 mg via INTRAVENOUS
  Administered 2018-04-17: 10 mg via INTRAVENOUS

## 2018-04-17 MED ORDER — LACTATED RINGERS IV SOLN
INTRAVENOUS | Status: DC | PRN
  Administered 2018-04-17: 16:00:00 via INTRAVENOUS

## 2018-04-17 MED ORDER — PROMETHAZINE HCL 25 MG/ML IJ SOLN: 6.2500 mg | INTRAMUSCULAR | Status: DC | PRN

## 2018-04-17 MED ORDER — PIPERACILLIN-TAZOBACTAM 3.375 G IVPB: 3.3750 g | Freq: Three times a day (TID) | INTRAVENOUS | Status: DC

## 2018-04-17 MED ORDER — LACTATED RINGERS IV SOLN: INTRAVENOUS | Status: DC | PRN

## 2018-04-17 MED ORDER — SODIUM CHLORIDE 0.9% FLUSH
10.0000 mL | INTRAVENOUS | Status: DC | PRN
  Administered 2018-04-17: 10 mL
  Administered 2018-04-19: 20 mL
  Filled 2018-04-17 (×2): qty 40

## 2018-04-17 MED ORDER — LIDOCAINE 2% (20 MG/ML) 5 ML SYRINGE
INTRAMUSCULAR | Status: AC
  Filled 2018-04-17: qty 5

## 2018-04-17 MED ORDER — PIPERACILLIN-TAZOBACTAM 3.375 G IVPB 30 MIN
3.3750 g | Freq: Once | INTRAVENOUS | Status: AC
  Administered 2018-04-17: 3.375 g via INTRAVENOUS
  Filled 2018-04-17: qty 50

## 2018-04-17 MED ORDER — LIDOCAINE HCL 1 % IJ SOLN
INTRAMUSCULAR | Status: AC
  Filled 2018-04-17: qty 20

## 2018-04-17 MED ORDER — ACETAMINOPHEN 10 MG/ML IV SOLN
INTRAVENOUS | Status: AC
  Filled 2018-04-17: qty 100

## 2018-04-17 MED ORDER — FAMOTIDINE IN NACL 20-0.9 MG/50ML-% IV SOLN
20.0000 mg | Freq: Two times a day (BID) | INTRAVENOUS | Status: DC
  Administered 2018-04-17 – 2018-04-23 (×13): 20 mg via INTRAVENOUS
  Filled 2018-04-17 (×13): qty 50

## 2018-04-17 MED ORDER — KETOROLAC TROMETHAMINE 30 MG/ML IJ SOLN
INTRAMUSCULAR | Status: AC
  Administered 2018-04-17: 30 mg via INTRAVENOUS
  Filled 2018-04-17: qty 1

## 2018-04-17 MED ORDER — PHENYLEPHRINE 40 MCG/ML (10ML) SYRINGE FOR IV PUSH (FOR BLOOD PRESSURE SUPPORT)
PREFILLED_SYRINGE | INTRAVENOUS | Status: DC | PRN
  Administered 2018-04-17 (×2): 80 ug via INTRAVENOUS

## 2018-04-17 MED ORDER — 0.9 % SODIUM CHLORIDE (POUR BTL) OPTIME
TOPICAL | Status: DC | PRN
  Administered 2018-04-17: 1000 mL

## 2018-04-17 MED ORDER — PIPERACILLIN-TAZOBACTAM 3.375 G IVPB
3.3750 g | Freq: Three times a day (TID) | INTRAVENOUS | Status: DC
  Administered 2018-04-17 – 2018-04-26 (×27): 3.375 g via INTRAVENOUS
  Filled 2018-04-17 (×29): qty 50

## 2018-04-17 MED ORDER — PHENYLEPHRINE 40 MCG/ML (10ML) SYRINGE FOR IV PUSH (FOR BLOOD PRESSURE SUPPORT)
PREFILLED_SYRINGE | INTRAVENOUS | Status: AC
  Filled 2018-04-17: qty 10

## 2018-04-17 MED ORDER — ACETAMINOPHEN 10 MG/ML IV SOLN
1000.0000 mg | Freq: Three times a day (TID) | INTRAVENOUS | Status: AC
  Administered 2018-04-17 – 2018-04-18 (×3): 1000 mg via INTRAVENOUS
  Filled 2018-04-17 (×2): qty 100

## 2018-04-17 MED ORDER — LIDOCAINE 2% (20 MG/ML) 5 ML SYRINGE
INTRAMUSCULAR | Status: DC | PRN
  Administered 2018-04-17: 60 mg via INTRAVENOUS

## 2018-04-17 MED ORDER — HYDROMORPHONE HCL 2 MG/ML IJ SOLN
INTRAMUSCULAR | Status: AC
  Administered 2018-04-17: 0.5 mg via INTRAVENOUS
  Filled 2018-04-17: qty 1

## 2018-04-17 MED ORDER — ONDANSETRON HCL 4 MG/2ML IJ SOLN
INTRAMUSCULAR | Status: DC | PRN
  Administered 2018-04-17: 4 mg via INTRAVENOUS

## 2018-04-17 MED ORDER — FAMOTIDINE IN NACL 20-0.9 MG/50ML-% IV SOLN
20.0000 mg | Freq: Once | INTRAVENOUS | Status: AC
  Administered 2018-04-17: 20 mg via INTRAVENOUS
  Filled 2018-04-17: qty 50

## 2018-04-17 MED ORDER — AMANTADINE HCL 50 MG/5ML PO SYRP
200.0000 mg | ORAL_SOLUTION | Freq: Two times a day (BID) | ORAL | Status: DC
  Administered 2018-04-18 – 2018-04-21 (×6): 200 mg
  Filled 2018-04-17 (×11): qty 20

## 2018-04-17 MED ORDER — ENOXAPARIN SODIUM 40 MG/0.4ML ~~LOC~~ SOLN
40.0000 mg | Freq: Every day | SUBCUTANEOUS | Status: DC
  Administered 2018-04-18 – 2018-05-01 (×14): 40 mg via SUBCUTANEOUS
  Filled 2018-04-17 (×14): qty 0.4

## 2018-04-17 MED ORDER — MORPHINE SULFATE (PF) 4 MG/ML IV SOLN: 2.0000 mg | INTRAVENOUS | Status: DC | PRN

## 2018-04-17 MED ORDER — HYDROMORPHONE HCL 2 MG/ML IJ SOLN
0.3000 mg | INTRAMUSCULAR | Status: DC | PRN
  Administered 2018-04-17 (×4): 0.5 mg via INTRAVENOUS

## 2018-04-17 MED ORDER — ENOXAPARIN SODIUM 40 MG/0.4ML ~~LOC~~ SOLN
40.0000 mg | Freq: Every day | SUBCUTANEOUS | Status: DC
  Filled 2018-04-17: qty 0.4

## 2018-04-17 MED ORDER — BACLOFEN 1 MG/ML ORAL SUSPENSION
10.0000 mg | Freq: Three times a day (TID) | ORAL | Status: DC
  Filled 2018-04-17 (×3): qty 1

## 2018-04-17 MED ORDER — ROCURONIUM BROMIDE 50 MG/5ML IV SOLN
INTRAVENOUS | Status: AC
  Filled 2018-04-17: qty 1

## 2018-04-17 MED ORDER — ONDANSETRON HCL 4 MG/2ML IJ SOLN
INTRAMUSCULAR | Status: AC
  Filled 2018-04-17: qty 2

## 2018-04-17 MED ORDER — INSULIN ASPART 100 UNIT/ML ~~LOC~~ SOLN
0.0000 [IU] | Freq: Three times a day (TID) | SUBCUTANEOUS | Status: DC
  Administered 2018-04-17: 2 [IU] via SUBCUTANEOUS
  Administered 2018-04-19: 1 [IU] via SUBCUTANEOUS
  Administered 2018-04-20: 2 [IU] via SUBCUTANEOUS
  Administered 2018-04-20 – 2018-04-23 (×3): 1 [IU] via SUBCUTANEOUS
  Filled 2018-04-17: qty 1

## 2018-04-17 MED ORDER — PROPOFOL 10 MG/ML IV BOLUS
INTRAVENOUS | Status: DC | PRN
  Administered 2018-04-17: 80 mg via INTRAVENOUS

## 2018-04-17 MED ORDER — SODIUM CHLORIDE 0.9 % IV SOLN: Freq: Once | INTRAVENOUS | Status: DC

## 2018-04-17 MED ORDER — SODIUM CHLORIDE 0.9 % IV BOLUS (SEPSIS): 1000.0000 mL | Freq: Once | INTRAVENOUS | Status: DC

## 2018-04-17 MED ORDER — BUPIVACAINE-EPINEPHRINE (PF) 0.25% -1:200000 IJ SOLN
INTRAMUSCULAR | Status: AC
Start: 2018-04-17 — End: ?
  Filled 2018-04-17: qty 30

## 2018-04-17 MED ORDER — SUCCINYLCHOLINE CHLORIDE 200 MG/10ML IV SOSY
PREFILLED_SYRINGE | INTRAVENOUS | Status: AC
  Filled 2018-04-17: qty 10

## 2018-04-17 MED ORDER — FENTANYL CITRATE (PF) 250 MCG/5ML IJ SOLN
INTRAMUSCULAR | Status: AC
  Filled 2018-04-17: qty 5

## 2018-04-17 MED ORDER — FENTANYL CITRATE (PF) 100 MCG/2ML IJ SOLN
INTRAMUSCULAR | Status: DC | PRN
  Administered 2018-04-17: 100 ug via INTRAVENOUS
  Administered 2018-04-17 (×3): 50 ug via INTRAVENOUS

## 2018-04-17 MED ORDER — LACTATED RINGERS IV SOLN
INTRAVENOUS | Status: DC
  Administered 2018-04-18: 18:00:00 via INTRAVENOUS

## 2018-04-17 MED ORDER — SUGAMMADEX SODIUM 200 MG/2ML IV SOLN
INTRAVENOUS | Status: AC
  Filled 2018-04-17: qty 2

## 2018-04-17 MED ORDER — SODIUM CHLORIDE 0.9% FLUSH
10.0000 mL | Freq: Two times a day (BID) | INTRAVENOUS | Status: DC
  Administered 2018-04-18 – 2018-04-26 (×7): 10 mL

## 2018-04-17 MED ORDER — PIPERACILLIN-TAZOBACTAM 3.375 G IVPB 30 MIN: 3.3750 g | Freq: Three times a day (TID) | INTRAVENOUS | Status: DC

## 2018-04-17 MED ORDER — LIDOCAINE HCL 1 % IJ SOLN: INTRAMUSCULAR | Status: DC | PRN

## 2018-04-17 SURGICAL SUPPLY — 61 items
BLADE CLIPPER SURG (BLADE) ×3 IMPLANT
CANISTER SUCT 3000ML PPV (MISCELLANEOUS) ×3 IMPLANT
CELLS DAT CNTRL 66122 CELL SVR (MISCELLANEOUS) ×1 IMPLANT
CHLORAPREP W/TINT 26ML (MISCELLANEOUS) ×3 IMPLANT
COVER SURGICAL LIGHT HANDLE (MISCELLANEOUS) ×3 IMPLANT
DECANTER SPIKE VIAL GLASS SM (MISCELLANEOUS) ×3 IMPLANT
DERMABOND ADVANCED (GAUZE/BANDAGES/DRESSINGS)
DERMABOND ADVANCED .7 DNX12 (GAUZE/BANDAGES/DRESSINGS) IMPLANT
DRAPE LAPAROSCOPIC ABDOMINAL (DRAPES) ×3 IMPLANT
DRAPE WARM FLUID 44X44 (DRAPE) ×3 IMPLANT
DRSG OPSITE POSTOP 4X10 (GAUZE/BANDAGES/DRESSINGS) IMPLANT
DRSG OPSITE POSTOP 4X8 (GAUZE/BANDAGES/DRESSINGS) ×3 IMPLANT
DRSG TEGADERM 2-3/8X2-3/4 SM (GAUZE/BANDAGES/DRESSINGS) ×3 IMPLANT
ELECT BLADE 6.5 EXT (BLADE) IMPLANT
ELECT CAUTERY BLADE 6.4 (BLADE) ×3 IMPLANT
ELECT REM PT RETURN 9FT ADLT (ELECTROSURGICAL) ×3
ELECTRODE REM PT RTRN 9FT ADLT (ELECTROSURGICAL) ×1 IMPLANT
GAUZE SPONGE 2X2 8PLY STRL LF (GAUZE/BANDAGES/DRESSINGS) ×1 IMPLANT
GLOVE BIO SURGEON STRL SZ 6 (GLOVE) ×3 IMPLANT
GLOVE BIOGEL PI IND STRL 7.5 (GLOVE) ×2 IMPLANT
GLOVE BIOGEL PI INDICATOR 7.5 (GLOVE) ×4
GLOVE INDICATOR 6.5 STRL GRN (GLOVE) ×3 IMPLANT
GLOVE SURG SIGNA 7.5 PF LTX (GLOVE) ×3 IMPLANT
GOWN STRL REUS W/ TWL LRG LVL3 (GOWN DISPOSABLE) ×4 IMPLANT
GOWN STRL REUS W/TWL 2XL LVL3 (GOWN DISPOSABLE) ×3 IMPLANT
GOWN STRL REUS W/TWL LRG LVL3 (GOWN DISPOSABLE) ×8
KIT BASIN OR (CUSTOM PROCEDURE TRAY) ×3 IMPLANT
KIT TURNOVER KIT B (KITS) ×3 IMPLANT
L-HOOK LAP DISP 36CM (ELECTROSURGICAL)
LHOOK LAP DISP 36CM (ELECTROSURGICAL) IMPLANT
LIGASURE IMPACT 36 18CM CVD LR (INSTRUMENTS) IMPLANT
NS IRRIG 1000ML POUR BTL (IV SOLUTION) ×6 IMPLANT
PACK GENERAL/GYN (CUSTOM PROCEDURE TRAY) ×3 IMPLANT
PAD ARMBOARD 7.5X6 YLW CONV (MISCELLANEOUS) ×6 IMPLANT
PENCIL BUTTON HOLSTER BLD 10FT (ELECTRODE) IMPLANT
RTRCTR WOUND ALEXIS 18CM MED (MISCELLANEOUS) ×3
SCISSORS ENDO CVD 5DCS (MISCELLANEOUS) ×3 IMPLANT
SCISSORS LAP 5X35 DISP (ENDOMECHANICALS) IMPLANT
SET IRRIG TUBING LAPAROSCOPIC (IRRIGATION / IRRIGATOR) IMPLANT
SLEEVE ENDOPATH XCEL 5M (ENDOMECHANICALS) ×3 IMPLANT
SPECIMEN JAR LARGE (MISCELLANEOUS) IMPLANT
SPONGE GAUZE 2X2 STER 10/PKG (GAUZE/BANDAGES/DRESSINGS) ×2
SPONGE LAP 18X18 X RAY DECT (DISPOSABLE) IMPLANT
STAPLER VISISTAT 35W (STAPLE) ×3 IMPLANT
SUCTION POOLE TIP (SUCTIONS) ×3 IMPLANT
SUT MNCRL AB 4-0 PS2 18 (SUTURE) ×3 IMPLANT
SUT PDS AB 1 TP1 96 (SUTURE) ×6 IMPLANT
SUT PDS II 0 TP-1 LOOPED 60 (SUTURE) IMPLANT
SUT VIC AB 2-0 SH 18 (SUTURE) ×3 IMPLANT
SUT VIC AB 3-0 SH 18 (SUTURE) ×3 IMPLANT
SUT VICRYL 4-0 PS2 18IN ABS (SUTURE) IMPLANT
SUT VICRYL AB 2 0 TIES (SUTURE) ×3 IMPLANT
SUT VICRYL AB 3 0 TIES (SUTURE) ×3 IMPLANT
TOWEL OR 17X24 6PK STRL BLUE (TOWEL DISPOSABLE) ×3 IMPLANT
TRAY FOLEY MTR SLVR 16FR STAT (SET/KITS/TRAYS/PACK) IMPLANT
TRAY LAPAROSCOPIC MC (CUSTOM PROCEDURE TRAY) IMPLANT
TROCAR XCEL BLUNT TIP 100MML (ENDOMECHANICALS) IMPLANT
TROCAR XCEL NON-BLD 11X100MML (ENDOMECHANICALS) IMPLANT
TROCAR XCEL NON-BLD 5MMX100MML (ENDOMECHANICALS) ×3 IMPLANT
TUBING INSUFFLATION (TUBING) ×3 IMPLANT
YANKAUER SUCT BULB TIP NO VENT (SUCTIONS) IMPLANT

## 2018-04-17 NOTE — Anesthesia Procedure Notes (Addendum)
Arterial Line Insertion Start/End5/14/2019 3:05 PM, 04/17/2018 3:09 PM Performed by: Dorie Rank, CRNA, CRNA  Patient location: OR. Preanesthetic checklist: patient identified, IV checked, site marked, risks and benefits discussed, surgical consent, monitors and equipment checked, pre-op evaluation and timeout performed Lidocaine 1% used for infiltration Right, radial was placed Catheter size: 20 G Hand hygiene performed , maximum sterile barriers used  and Seldinger technique used Allen's test indicative of satisfactory collateral circulation Attempts: 2 Procedure performed without using ultrasound guided technique. Following insertion, Biopatch and dressing applied. Post procedure assessment: normal  Patient tolerated the procedure well with no immediate complications.

## 2018-04-17 NOTE — Transfer of Care (Signed)
Immediate Anesthesia Transfer of Care Note  Patient: Matthew Meza  Procedure(s) Performed: LAPAROSCOPY DIAGNOSTIC (N/A ) EXPLORATORY LAPAROTOMY (N/A )  Patient Location: PACU  Anesthesia Type:General  Level of Consciousness: awake, alert  and patient cooperative  Airway & Oxygen Therapy: Patient Spontanous Breathing and Patient connected to nasal cannula oxygen  Post-op Assessment: Report given to RN and Post -op Vital signs reviewed and stable  Post vital signs: Reviewed and stable  Last Vitals:  Vitals Value Taken Time  BP 166/103 04/17/2018  4:49 PM  Temp    Pulse 117 04/17/2018  4:49 PM  Resp 21 04/17/2018  4:49 PM  SpO2 95 % 04/17/2018  4:49 PM  Vitals shown include unvalidated device data.  Last Pain:  Vitals:   04/17/18 1021  TempSrc:   PainSc: 0-No pain         Complications: No apparent anesthesia complications

## 2018-04-17 NOTE — ED Triage Notes (Signed)
Patient arrived with EMS from [facility] nursing home , staff reports bloody emesis x1 this morning generalized abdominal pain and diarrhea . CBG= 179 by EMS , he received Zofran 4 mg IV by EMS .

## 2018-04-17 NOTE — ED Provider Notes (Signed)
[facility] EMERGENCY DEPARTMENT Provider Note   CSN: [id number] Arrival date & time: 04/17/18  0350     History   Chief Complaint Chief Complaint  Patient presents with  . Hematemesis    HPI Matthew Meza is a 77 y.o. male.  HPI  This is a 77 year old male with a history of diabetes, Parkinson's disease, stroke, hypertension, hyperlipidemia who presents from [facility] with reported one episode of bloody emesis and generalized abdominal pain as well as diarrhea.  Patient provides very limited history.  He is a poor historian.  He is oriented to self and place.  Unclear if this is his baseline.  Currently he denies any pain.  He does not recall having any vomiting.  He denies chest pain, shortness of breath.  States "I do not have any abdominal pain now."  Difficult to determine if he had pain previously.  Chart reviewed at the bedside.  Documentation reveals he is a full code.  He is on Plavix.  Past Medical History:  Diagnosis Date  . Diabetes mellitus without complication (HCC)    type 2  . GERD (gastroesophageal reflux disease)   . Hyperlipidemia   . Hypertension   . No pertinent past medical history   . Parkinson's disease (HCC)   . Right hemiparesis (HCC)   . Stroke [facility]) 04/12/2017    Patient Active Problem List   Diagnosis Date Noted  . 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 Sepsis (HCC) 06/03/2017  . Intracerebral hemorrhage (HCC) - post neuro intervention 04/12/2017  . 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 Hyperlipidemia 04/12/2017  . Pneumonia, Probable 04/12/2017  . Urinary tract infection 04/12/2017  . Obesity 04/12/2017  . Family history of stroke 04/12/2017  . Lethargy 04/12/2017  . Pressure injury of skin 04/10/2017  . Dysarthria, post-stroke   . Dysphagia, post-stroke   . Tachypnea   . Prediabetes   . Leukocytosis   . Hypophosphatemia   . Stage  2 chronic kidney disease   . 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 Electrolyte imbalance 04/01/2017  . 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 Essential hypertension   . Acute encephalopathy   . Seasonal allergic rhinitis 10/30/2012    Past Surgical History:  Procedure Laterality Date  . APPENDECTOMY    . ESOPHAGOGASTRODUODENOSCOPY N/A 04/07/2017   Procedure: ESOPHAGOGASTRODUODENOSCOPY (EGD);  Surgeon: Violeta Gelinas, MD;  Location: [facility] ENDOSCOPY;  Service: Endoscopy;  Laterality: N/A;  . IR ANGIO INTRA EXTRACRAN SEL COM CAROTID INNOMINATE UNI L MOD SED  03/31/2017  . IR INTRAVSC STENT CERV CAROTID W/O EMB-PROT MOD SED INC ANGIO  03/31/2017  . IR PERCUTANEOUS ART THROMBECTOMY/INFUSION INTRACRANIAL INC DIAG ANGIO  03/31/2017  . IR REPLC GASTRO/COLONIC TUBE PERCUT W/FLUORO  06/05/2017  . IR US GUIDE VASC ACCESS RIGHT  03/31/2017  . LAPAROSCOPIC APPENDECTOMY  06/21/2012   Procedure: APPENDECTOMY LAPAROSCOPIC;  Surgeon: Kandis Cocking, MD;  Location: [facility] ORS;  Service: General;  Laterality: N/A;  . NO PAST SURGERIES    . PEG PLACEMENT N/A 04/07/2017   Procedure: PERCUTANEOUS ENDOSCOPIC GASTROSTOMY (PEG) PLACEMENT;  Surgeon: Violeta Gelinas, MD;  Location: [facility] ENDOSCOPY;  Service: Endoscopy;  Laterality: N/A;  . RADIOLOGY WITH ANESTHESIA N/A 03/31/2017   Procedure: RADIOLOGY WITH ANESTHESIA;  Surgeon: Gilmer Mor, DO;  Location: [facility]  OR;  Service: Anesthesiology;  Laterality: N/A;        Home Medications    Prior to Admission medications   Medication Sig Start Date End Date Taking? Authorizing Provider  amantadine (SYMMETREL) 50 MG/5ML solution Place 20 mLs (200 mg total) into feeding tube 2 (two) times daily. 04/12/17 04/17/18 Yes Layne Benton, NP  atorvastatin (LIPITOR) 40 MG tablet Place 1 tablet (40 mg total) into feeding tube daily at 6 PM. 04/12/17  Yes Biby, Jani Files, NP    baclofen (LIORESAL) 10 mg/mL SUSP Take 1 mL (10 mg total) by mouth 3 (three) times daily. 04/12/17  Yes Layne Benton, NP  clopidogrel (PLAVIX) 75 MG tablet Place 1 tablet (75 mg total) into feeding tube daily. 04/13/17  Yes Layne Benton, NP  famotidine (PEPCID) 20 MG tablet Place 1 tablet (20 mg total) into feeding tube 2 (two) times daily. 04/12/17  Yes Layne Benton, NP  insulin aspart (NOVOLOG) 100 UNIT/ML injection Inject 8 Units into the skin every 4 (four) hours. Patient taking differently: Inject 8 Units into the skin 4 (four) times daily - after meals and at bedtime.  04/12/17  Yes Layne Benton, NP  ipratropium-albuterol (DUONEB) 0.5-2.5 (3) MG/3ML SOLN Take 3 mLs by nebulization every 4 (four) hours as needed. Patient taking differently: Take 3 mLs by nebulization every 4 (four) hours as needed (SOB).  04/12/17  Yes Layne Benton, NP  polyethylene glycol (MIRALAX / GLYCOLAX) packet Take 17 g by mouth at bedtime.   Yes [provider]  pseudoephedrine-guaifenesin (MUCINEX D) 60-600 MG 12 hr tablet Take 1 tablet by mouth every 12 (twelve) hours.   Yes [provider]  aspirin 325 MG tablet Place 1 tablet (325 mg total) into feeding tube daily. Patient not taking: Reported on 04/17/2018 04/13/17   Layne Benton, NP  feeding supplement, ENSURE ENLIVE, (ENSURE ENLIVE) LIQD Take 237 mLs by mouth 2 (two) times daily between meals. Patient not taking: Reported on 04/17/2018 06/07/17   Vassie Loll, MD    Family History Family History  Problem Relation Age of Onset  . Stroke Mother     Social History Social History   Tobacco Use  . Smoking status: Former Smoker    Years: 20.00    Last attempt to quit: 06/22/1995    Years since quitting: 22.8  . Smokeless tobacco: Never Used  Substance Use Topics  . Alcohol use: No  . Drug use: No     Allergies   No known allergies   Review of Systems Review of Systems  Unable to perform ROS: Acuity of condition  Respiratory:  Negative for shortness of breath.   Cardiovascular: Negative for chest pain.  Gastrointestinal: Positive for nausea and vomiting.     Physical Exam Updated Vital Signs BP (!) 152/114   Pulse (!) 114   Temp 97.9 F (36.6 C) (Rectal)   Resp (!) 22   Ht 6' (1.829 m)   Wt 86.2 kg (190 lb)   SpO2 100%   BMI 25.77 kg/m   Physical Exam  Constitutional:  Chronically ill-appearing, nontoxic, no acute distress  HENT:  Head: Normocephalic and atraumatic.  Dried dark blood around the mouth  Eyes: Pupils are equal, round, and reactive to light.  Pupils 3 mm reactive bilaterally  Cardiovascular: Regular rhythm and normal heart sounds.  No murmur heard. Tachycardia  Pulmonary/Chest: Effort normal and breath sounds normal. No respiratory distress. He has no wheezes.  Abdominal: Soft. Bowel  sounds are normal. There is no tenderness. There is no rebound and no guarding.  Neurological: He is alert.  Oriented x2, generally blank expression, normal symmetric facial expressions, left-sided hemiplegia noted  Skin: Skin is dry.  Mottled, cool to touch  Psychiatric: He has a normal mood and affect.  Nursing note and vitals reviewed.    ED Treatments / Results  Labs (all labs ordered are listed, but only abnormal results are displayed) Labs Reviewed  CBC WITH DIFFERENTIAL/PLATELET - Abnormal; Notable for the following components:      Result Value   WBC 27.8 (*)    RBC 5.87 (*)    Hemoglobin 19.2 (*)    HCT 56.2 (*)    Neutro Abs 25.0 (*)    Monocytes Absolute 1.7 (*)    All other components within normal limits  COMPREHENSIVE METABOLIC PANEL - Abnormal; Notable for the following components:   Glucose, Bld 166 (*)    AST 46 (*)    ALT 87 (*)    Alkaline Phosphatase 147 (*)    Total Bilirubin 1.4 (*)    All other components within normal limits  I-STAT CG4 LACTIC ACID, ED - Abnormal; Notable for the following components:   Lactic Acid, Venous 4.39 (*)    All other components  within normal limits  I-STAT CG4 LACTIC ACID, ED - Abnormal; Notable for the following components:   Lactic Acid, Venous 4.96 (*)    All other components within normal limits  CULTURE, BLOOD (ROUTINE X 2)  CULTURE, BLOOD (ROUTINE X 2)  LIPASE, BLOOD  URINALYSIS, ROUTINE W REFLEX MICROSCOPIC  POC OCCULT BLOOD, ED  TYPE AND SCREEN    EKG EKG Interpretation  Date/Time:  Tuesday Apr 17 2018 04:07:06 EDT Ventricular Rate:  126 PR Interval:    QRS Duration: 123 QT Interval:  292 QTC Calculation: 423 R Axis:   86 Text Interpretation:  Sinus tachycardia Right bundle branch block Inferior infarct, age indeterminate Repol abnrm suggests ischemia, diffuse leads Minimal ST elevation, lateral leads Confirmed by Ross Marcus ([id number]) on 04/17/2018 4:53:39 AM Also confirmed by Ross Marcus ([id number]), editor Elita Quick ([id number])  on 04/17/2018 6:57:20 AM   Radiology Ct Abdomen Pelvis W Contrast  Result Date: 04/17/2018 CLINICAL DATA:  Acute onset of generalized abdominal pain, diarrhea and hematemesis. EXAM: CT ABDOMEN AND PELVIS WITH CONTRAST TECHNIQUE: Multidetector CT imaging of the abdomen and pelvis was performed using the standard protocol following bolus administration of intravenous contrast. CONTRAST:  OMNIPAQUE IOHEXOL 300 MG/ML  SOLN COMPARISON:  CT of the abdomen and pelvis performed 04/23/2014, and abdominal radiograph performed earlier today at 5:48 a.m. FINDINGS: Lower chest: Minimal bibasilar atelectasis is noted. The visualized portions of the mediastinum are unremarkable. Hepatobiliary: Portal venous gas is noted within the liver, of uncertain etiology. The liver is otherwise unremarkable in appearance. The gallbladder is grossly unremarkable. The common bile duct is normal in caliber. Pancreas: The pancreas is within normal limits. Spleen: The spleen is unremarkable in appearance. Adrenals/Urinary Tract: The adrenal glands are unremarkable in appearance. 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 Left renal cysts are also noted. There is no evidence of hydronephrosis. No renal or ureteral stones are identified. Nonspecific perinephric stranding is noted bilaterally. Stomach/Bowel: The stomach is unremarkable in appearance. There is distention of small-bowel loops to 4.0 cm in maximal diameter, with gradual decompression at the right lower quadrant. No focal transition point is seen. This may simply reflect some degree of small  bowel dysmotility. Slight wall thickening is suggested along the mid ileum, which could reflect a mild infectious or inflammatory process. There is no evidence of pneumatosis or bowel ischemia on provided images to explain the patient's portal venous gas. The patient is status post appendectomy. The colon is unremarkable in appearance. Vascular/Lymphatic: Scattered calcification is seen along the abdominal aorta and its branches. The abdominal aorta is otherwise grossly unremarkable. The inferior vena cava is grossly unremarkable. No retroperitoneal lymphadenopathy is seen. No pelvic sidewall lymphadenopathy is identified. Reproductive: The bladder is mildly distended and grossly unremarkable. The prostate is enlarged, measuring 5.9 cm in transverse dimension, with heterogeneity. Other: No additional soft tissue abnormalities are seen. Musculoskeletal: No acute osseous abnormalities are identified. The visualized musculature is unremarkable in appearance. IMPRESSION: 1. Portal venous gas within the liver, of uncertain etiology. No evidence of pneumatosis or bowel ischemia on provided images. 2. Distention of small-bowel loops to 4.0 cm in maximal diameter, with gradual decompression at the right lower quadrant. This may simply reflect some degree of small bowel dysmotility; no evidence of bowel obstruction. 3. Slight wall thickening along the mid ileum could reflect a mild infectious or inflammatory process. 4. Enlarged prostate.   Would correlate with PSA. 5. 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 Aortic Atherosclerosis (ICD10-I70.0). These results were called by telephone at the time of interpretation on 04/17/2018 at 6:42 am to Dr. Ross Marcus , who verbally acknowledged these results. Electronically Signed   By: Roanna Raider M.D.   On: 04/17/2018 06:46   Dg Abdomen Acute W/chest  Result Date: 04/17/2018 CLINICAL DATA:  Acute onset of vomiting. EXAM: DG ABDOMEN ACUTE W/ 1V CHEST COMPARISON:  Chest radiograph performed 06/03/2017, and lumbar spine radiographs performed 06/02/2013 FINDINGS: The lungs are hypoexpanded but appear grossly clear. There is no evidence of focal opacification, pleural effusion or pneumothorax. The cardiomediastinal silhouette is within normal limits. There is distention of small-bowel loops to 4.5 cm in maximal diameter, concerning for some degree of small bowel obstruction. The colon is not well assessed. The stomach is largely filled with air and fluid. No free intra-abdominal air is identified on the provided upright view. No acute osseous abnormalities are seen; the sacroiliac joints are unremarkable in appearance. IMPRESSION: 1. Dilatation of small-bowel loops to 4.5 cm in maximal diameter, concerning for some degree of small bowel obstruction. No free intra-abdominal air seen. 2. Lungs hypoexpanded but grossly clear. These results were called by telephone at the time of interpretation on 04/17/2018 at 6:20 am to Dr. Ross Marcus, who verbally acknowledged these results. Electronically Signed   By: Roanna Raider M.D.   On: 04/17/2018 06:21    Procedures Procedures (including critical care time)  CRITICAL CARE Performed by: Shon Baton   Total critical care time: 45 minutes  Critical care time was exclusive of separately billable procedures and treating other patients.  Critical care was necessary to treat or prevent imminent or life-threatening  deterioration.  Critical care was time spent personally by me on the following activities: development of treatment plan with patient and/or surrogate as well as nursing, discussions with consultants, evaluation of patient's response to treatment, examination of patient, obtaining history from patient or surrogate, ordering and performing treatments and interventions, ordering and review of laboratory studies, ordering and review of radiographic studies, pulse oximetry and re-evaluation of patient's condition.   Medications Ordered in ED Medications  sodium chloride 0.9 % bolus 1,000 mL (1,000 mLs Intravenous New Bag/Given 04/17/18 0536)  And  sodium chloride 0.9 % bolus 1,000 mL (1,000 mLs Intravenous New Bag/Given 04/17/18 0538)    And  sodium chloride 0.9 % bolus 1,000 mL (1,000 mLs Intravenous Not Given 04/17/18 0615)  piperacillin-tazobactam (ZOSYN) IVPB 3.375 g (has no administration in time range)  famotidine (PEPCID) IVPB 20 mg premix (0 mg Intravenous Stopped 04/17/18 0521)  sodium chloride 0.9 % bolus 1,000 mL (0 mLs Intravenous Stopped 04/17/18 0521)  piperacillin-tazobactam (ZOSYN) IVPB 3.375 g (0 g Intravenous Stopped 04/17/18 0633)  iohexol (OMNIPAQUE) 300 MG/ML solution 100 mL (100 mLs Intravenous Contrast Given 04/17/18 0606)     Initial Impression / Assessment and Plan / ED Course  I have reviewed the triage vital signs and the nursing notes.  Pertinent labs & imaging results that were available during my care of the patient were reviewed by me and considered in my medical decision making (see chart for details).     Presents with reported hematemesis and abdominal pain.  He is tachycardic and hypertensive.  He is very difficult to get a history from.  He does have a advanced directives that indicates full resuscitation.  Sepsis work-up was initiated.  He is afebrile.  Lactate was noted to be 4.39.  He was given 30 cc per kilo of fluid and Zofran to cover for abdominal  pathology.  White count is 27.8.  Hemoglobin is 19.  He does appear hemoconcentrated and dry.  Initial x-ray was concerning for obstruction.  CT scan shows portal venous gas without any clear etiology.  The obstructive pattern on x-ray seems to decompress on the CT scan.  NG tube was placed and patient had half a liter of dark output.  After 30 cc/kg, patient's lactate now is 4.9.  Patient was discussed with the admitting hospitalist.  Urinalysis is pending.  Patient is showing signs of severe sepsis likely from intra-abdominal pathology.   Final Clinical Impressions(s) / ED Diagnoses   Final diagnoses:  Hematemesis, presence of nausea not specified  Partial intestinal obstruction, unspecified cause (HCC)  Severe sepsis [facility])    ED Discharge Orders    None       Shon Baton, MD 04/17/18 [phone]

## 2018-04-17 NOTE — Significant Event (Addendum)
Rapid Response Event Note  Overview: "second set of eyes" -  Shock ? Sepsis  Initial Focused Assessment:  While on 5W, RN asked me assess patient. Per RN, patient's skin is mottled, HR in the 110-120s, and lactic acid of 7.0 at 1821. Patient arrived from PACU at 2038 s/p ex-lap.  Patient has a history of dementia and parkinson, so baseline neuro status is unknown, currently, patient opens his eyes to voice and moans to pain, tracks around the room.  Lung sounds are clear, skin - red and very warm to touch in the chest/trunk/back, however all extremities are very cool to touch, dusky/mottled, delayed capillary refill, + pulses (thready but present). SBP 120-130s, MAP 80-90, rectal temp of 98.7, RR 8-12, sats 100% on 2L. Since arrival to 5W, UO 100cc.   Interventions: -- 1L NS bolus (total per [facility] after this will be 3L). 30cc/kg achieved after this bolus (2,580cc fluids needed per 86kg). -- STAT CMP/CBC  Plan of Care (if not transferred): -- I spoke with [facility]CS MD on call at 2250, updated MD on condition of patient, per MD, from surgical perspective, abdomen was clear. I spoke and updated the [facility] NP as well at 2215 and 2255. -- Awaiting completion of fluid bolus and labs. BP cycling Q66mins -- Level of Care - upgraded to SDU from telemetry -- I called for an update at 0100, per RN, UO has been 200cc and SBP 100-115s. Morning AMs (including Lactic) will be drawn at 0200.  -- I came and saw the patient at 0200, patient was more awake, conversant, able to tell where he was and what was going on. Awaiting lab results.  -- Lactic improved, VSS, TRH NP made aware that morning labs had resulted. TRH NP ordered 500cc NS bolus and repeat lactic at 0600.  Event Summary:   at    Start Time 2200 End Time 0300  Andie Mungin R

## 2018-04-17 NOTE — Anesthesia Preprocedure Evaluation (Addendum)
Anesthesia Evaluation  Patient identified by MRN, date of birth, ID band Patient awake    Reviewed: Allergy & Precautions, NPO status , Patient's Chart, lab work & pertinent test results  Airway Mallampati: I  TM Distance: >3 FB Neck ROM: Full    Dental  (+) Teeth Intact, Dental Advisory Given   Pulmonary former smoker,     + decreased breath sounds      Cardiovascular hypertension, + Peripheral Vascular Disease   Rhythm:Regular Rate:Tachycardia     Neuro/Psych PSYCHIATRIC DISORDERS Dementia CVA, Residual Symptoms    GI/Hepatic GERD  Medicated,  Endo/Other  diabetes, Type 2, Insulin Dependent  Renal/GU      Musculoskeletal negative musculoskeletal ROS (+)   Abdominal (+)  Abdomen: tender.    Peds  Hematology negative hematology ROS (+)   Anesthesia Other Findings - Parkinson's   Reproductive/Obstetrics                            Lab Results  Component Value Date   WBC 27.8 (H) 04/17/2018   HGB 19.2 (H) 04/17/2018   HCT 56.2 (H) 04/17/2018   MCV 95.7 04/17/2018   PLT 241 04/17/2018   Lab Results  Component Value Date   CREATININE 1.05 04/17/2018   BUN 14 04/17/2018   NA 141 04/17/2018   K 4.4 04/17/2018   CL 103 04/17/2018   CO2 24 04/17/2018   Lab Results  Component Value Date   INR 1.17 06/03/2017   INR 1.07 03/31/2017   EKG: sinus tachycardia, RBBB.  Anesthesia Physical Anesthesia Plan  ASA: III and emergent  Anesthesia Plan: General   Post-op Pain Management:    Induction: Intravenous  PONV Risk Score and Plan: 3 and Ondansetron and Treatment may vary due to age or medical condition  Airway Management Planned: Oral ETT  Additional Equipment: Arterial line, CVP and Ultrasound Guidance Line Placement  Intra-op Plan:   Post-operative Plan: Possible Post-op intubation/ventilation  Informed Consent: I have reviewed the patients History and Physical, chart,  labs and discussed the procedure including the risks, benefits and alternatives for the proposed anesthesia with the patient or authorized representative who has indicated his/her understanding and acceptance.   Dental advisory given  Plan Discussed with: CRNA  Anesthesia Plan Comments:        Anesthesia Quick Evaluation

## 2018-04-17 NOTE — Anesthesia Procedure Notes (Signed)
Central Venous Catheter Insertion Performed by: Shelton Silvas, MD, anesthesiologist Start/End5/14/2019 3:08 PM, 04/17/2018 3:15 PM Patient location: Pre-op. Preanesthetic checklist: patient identified, IV checked, site marked, risks and benefits discussed, surgical consent, monitors and equipment checked, pre-op evaluation, timeout performed and anesthesia consent Position: Trendelenburg Lidocaine 1% used for infiltration and patient sedated Hand hygiene performed , maximum sterile barriers used  and Seldinger technique used Catheter size: 8 Fr Total catheter length 16. Central line was placed.Double lumen Procedure performed using ultrasound guided technique. Ultrasound Notes:anatomy identified, needle tip was noted to be adjacent to the nerve/plexus identified, no ultrasound evidence of intravascular and/or intraneural injection and image(s) printed for medical record Attempts: 1 Following insertion, dressing applied, line sutured and Biopatch. Post procedure assessment: blood return through all ports  Patient tolerated the procedure well with no immediate complications.

## 2018-04-17 NOTE — Anesthesia Postprocedure Evaluation (Signed)
Anesthesia Post Note  Patient: Matthew Meza  Procedure(s) Performed: LAPAROSCOPY DIAGNOSTIC (N/A ) EXPLORATORY LAPAROTOMY (N/A )     Patient location during evaluation: PACU Anesthesia Type: General Level of consciousness: awake and alert Pain management: pain level controlled Vital Signs Assessment: post-procedure vital signs reviewed and stable Respiratory status: spontaneous breathing, nonlabored ventilation, respiratory function stable and patient connected to nasal cannula oxygen Cardiovascular status: blood pressure returned to baseline and stable Postop Assessment: no apparent nausea or vomiting Anesthetic complications: no    Last Vitals:  Vitals:   04/17/18 1823 04/17/18 1830  BP:  (!) 151/88  Pulse: (!) 117 (!) 115  Resp: 11 (!) 9  Temp:    SpO2: 100% 100%    Last Pain:  Vitals:   04/17/18 1830  TempSrc:   PainSc: Asleep                 Shelton Silvas

## 2018-04-17 NOTE — Progress Notes (Signed)
Pharmacy Antibiotic Note  Matthew Meza is a 77 y.o. male admitted on 04/17/2018 with bloody emesis and abdominal pain.  Pharmacy has been consulted for Zosyn dosing. WBC 27.8, LA 4.4  Plan: Zosyn 3.375g IV q8h (4 hour infusion).     Temp (24hrs), Avg:97.9 F (36.6 C), Min:97.9 F (36.6 C), Max:97.9 F (36.6 C)  Recent Labs  Lab 04/17/18 0423 04/17/18 0459  WBC 27.8*  --   CREATININE 1.05  --   LATICACIDVEN  --  4.39*    CrCl cannot be calculated (Unknown ideal weight.).    Allergies  Allergen Reactions  . No Known Allergies     Thank you for allowing pharmacy to be a part of this patient's care.  Toniann Fail Catheryn Slifer 04/17/2018 5:34 AM

## 2018-04-17 NOTE — Progress Notes (Signed)
Dr. Magnus Ivan on call for Dr. Donell Beers notified of panic lactic acid level. He said to notify Medical MD.

## 2018-04-17 NOTE — Anesthesia Procedure Notes (Signed)
Procedure Name: Intubation Date/Time: 04/17/2018 3:03 PM Performed by: Julian Reil, CRNA Pre-anesthesia Checklist: Patient identified, Emergency Drugs available, Suction available, Patient being monitored and Timeout performed Patient Re-evaluated:Patient Re-evaluated prior to induction Oxygen Delivery Method: Circle system utilized Preoxygenation: Pre-oxygenation with 100% oxygen Induction Type: IV induction, Rapid sequence and Cricoid Pressure applied Laryngoscope Size: Miller and 3 Grade View: Grade I Tube type: Subglottic suction tube Tube size: 7.5 mm Number of attempts: 1 Airway Equipment and Method: Stylet Placement Confirmation: ETT inserted through vocal cords under direct vision,  positive ETCO2 and breath sounds checked- equal and bilateral Secured at: 22 cm Tube secured with: Tape Dental Injury: Teeth and Oropharynx as per pre-operative assessment  Comments: NG suctioned, small amounts brownish liquid then to gravity drain during induction, DL.  1O1W bite block used at end of case.

## 2018-04-17 NOTE — Progress Notes (Signed)
Dr. Clearence Ped notified of panic lactic acid level, orders written.

## 2018-04-17 NOTE — Consult Note (Addendum)
[facility] Consult Note  Matthew Meza [facility] 12/17/1940  [id number].    Requesting MD: Thomes Dinning Chief Complaint/Reason for Consult: portal venous gas  HPI:  Matthew Meza is a 77yo male PMH DM, H/o CVA with residual left sided weakness, Dementia, HTN, HLD, and GERD, who was brought to [facility] earlier today from [place] home with reportedly one episode of emesis. Patient is not a great historian due to dementia and there is no family at bedside, therefore the information in this HPI was taken from the chart. Patient reportedly doing well until last night when he had one episode of coffee ground emesis. He was complaining of abdominal pain.  States that his last BM was yesterday. CT scan showed congenital malrotation, swirling pattern of small bowel mesentery in the right abdomen consistent with volvulus and partial small bowel obstruction as well as portal venous gas within the liver. WBC 27.8, hg 19.2, AST 46, ALT 87, alk phos 147, bilirubin 1.4. NG tube was placed with 1L out in ED. Subsequently he has had about 800cc dark/brown output. General surgery asked to see.  Abdominal surgical history: laparoscopic appendectomy 2013, PEG 2018 subsequently removed Anticoagulants: Plavix Lives at [place]: unable to fully assess ROS due to dementia   Family History  Problem Relation Age of Onset  . Stroke Mother     Past Medical History:  Diagnosis Date  . Diabetes mellitus without complication ([place])    type 2  . GERD (gastroesophageal reflux disease)   . Hyperlipidemia   . Hypertension   . No pertinent past medical history   . Parkinson's disease ([place])   . 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 Stroke [facility]) 04/12/2017    Past Surgical History:  Procedure Laterality Date  . APPENDECTOMY    . ESOPHAGOGASTRODUODENOSCOPY N/A 04/07/2017   Procedure: ESOPHAGOGASTRODUODENOSCOPY (EGD);  Surgeon: Georganna Skeans, MD;  Location: [place];  Service: Endoscopy;   Laterality: N/A;  . IR ANGIO INTRA EXTRACRAN SEL COM CAROTID INNOMINATE UNI L MOD SED  03/31/2017  . IR INTRAVSC STENT CERV CAROTID W/O EMB-PROT MOD SED INC ANGIO  03/31/2017  . IR PERCUTANEOUS ART THROMBECTOMY/INFUSION INTRACRANIAL INC DIAG ANGIO  03/31/2017  . IR REPLC GASTRO/COLONIC TUBE PERCUT W/FLUORO  06/05/2017  . IR US GUIDE VASC ACCESS RIGHT  03/31/2017  . LAPAROSCOPIC APPENDECTOMY  06/21/2012   Procedure: APPENDECTOMY LAPAROSCOPIC;  Surgeon: Shann Medal, MD;  Location: [facility];  Service: General;  Laterality: N/A;  . NO PAST SURGERIES    . PEG PLACEMENT N/A 04/07/2017   Procedure: PERCUTANEOUS ENDOSCOPIC GASTROSTOMY (PEG) PLACEMENT;  Surgeon: Georganna Skeans, MD;  Location: [place];  Service: Endoscopy;  Laterality: N/A;  . RADIOLOGY WITH ANESTHESIA N/A 03/31/2017   Procedure: RADIOLOGY WITH ANESTHESIA;  Surgeon: Corrie Mckusick, DO;  Location: [place];  Service: Anesthesiology;  Laterality: N/A;    Social History:  reports that he quit smoking about 22 years ago. He quit after 20.00 years of use. He has never used smokeless tobacco. He reports that he does not drink alcohol or use drugs.  Allergies:  Allergies  Allergen Reactions  . No Known Allergies      (Not in a hospital admission)  Prior to Admission medications   Medication Sig Start Date End Date Taking? Authorizing Provider  amantadine (SYMMETREL) 50 MG/5ML solution Place 20 mLs (200 mg total) into feeding tube 2 (two) times daily. 04/12/17 04/17/18 Yes Donzetta Starch, NP  atorvastatin (LIPITOR) 40 MG tablet Place  1 tablet (40 mg total) into feeding tube daily at 6 PM. 04/12/17  Yes Biby, Massie Kluver, NP  baclofen (LIORESAL) 10 mg/mL SUSP Take 1 mL (10 mg total) by mouth 3 (three) times daily. 04/12/17  Yes Donzetta Starch, NP  clopidogrel (PLAVIX) 75 MG tablet Place 1 tablet (75 mg total) into feeding tube daily. 04/13/17  Yes Donzetta Starch, NP  famotidine (PEPCID) 20 MG tablet Place 1 tablet (20 mg total) into feeding tube 2 (two)  times daily. 04/12/17  Yes Donzetta Starch, NP  insulin aspart (NOVOLOG) 100 UNIT/ML injection Inject 8 Units into the skin every 4 (four) hours. Patient taking differently: Inject 8 Units into the skin 4 (four) times daily - after meals and at bedtime.  04/12/17  Yes Donzetta Starch, NP  ipratropium-albuterol (DUONEB) 0.5-2.5 (3) MG/3ML SOLN Take 3 mLs by nebulization every 4 (four) hours as needed. Patient taking differently: Take 3 mLs by nebulization every 4 (four) hours as needed (SOB).  04/12/17  Yes Donzetta Starch, NP  polyethylene glycol (MIRALAX / GLYCOLAX) packet Take 17 g by mouth at bedtime.   Yes [provider]  pseudoephedrine-guaifenesin (MUCINEX D) 60-600 MG 12 hr tablet Take 1 tablet by mouth every 12 (twelve) hours.   Yes [provider]  aspirin 325 MG tablet Place 1 tablet (325 mg total) into feeding tube daily. Patient not taking: Reported on 04/17/2018 04/13/17   Donzetta Starch, NP  feeding supplement, ENSURE ENLIVE, (ENSURE ENLIVE) LIQD Take 237 mLs by mouth 2 (two) times daily between meals. Patient not taking: Reported on 04/17/2018 06/07/17   Barton Dubois, MD    Blood pressure (!) 170/113, pulse (!) 109, temperature 97.9 F (36.6 C), temperature source Rectal, resp. rate (!) 22, height 6' (1.829 m), weight 86.2 kg (190 lb), SpO2 96 %. Physical Exam: General: pleasant, elderly, and somewhat confused, WD, WN white male who is laying in bed in NAD HEENT: head is normocephalic, atraumatic.  Sclera are noninjected.  PERRL.  Ears and nose without any masses or lesions.  Mouth is pink and dry Heart: regular but tachycardic.  Normal s1,s2. No obvious murmurs, gallops, or rubs noted.  Palpable radial and pedal pulses bilaterally Lungs: CTAB, no wheezes, rhonchi, or rales noted.  Respiratory effort nonlabored Abd: soft, NT, mild distention, +BS, no masses, hernias, or organomegaly.  NGT in place with about 1800cc of dark brown bilious output so far. MS: all 4 extremities  are symmetrical with no cyanosis, clubbing, or edema.  Flaccid left side Skin: warm and dry with no masses, lesions, or rashes Psych: Alert.  Oriented to person, time, and situation.  Thinks he is at his rehab facility, but talks about things that don't make sense.   Results for orders placed or performed during the hospital encounter of 04/17/18 (from the past 48 hour(s))  CBC with Differential     Status: Abnormal   Collection Time: 04/17/18  4:23 AM  Result Value Ref Range   WBC 27.8 (H) 4.0 - 10.5 K/uL   RBC 5.87 (H) 4.22 - 5.81 MIL/uL   Hemoglobin 19.2 (H) 13.0 - 17.0 g/dL   HCT 56.2 (H) 39.0 - 52.0 %   MCV 95.7 78.0 - 100.0 fL   MCH 32.7 26.0 - 34.0 pg   MCHC 34.2 30.0 - 36.0 g/dL   RDW 14.3 11.5 - 15.5 %   Platelets 241 150 - 400 K/uL   Neutrophils Relative % 90 %   Lymphocytes Relative  4 %   Monocytes Relative 6 %   Eosinophils Relative 0 %   Basophils Relative 0 %   Neutro Abs 25.0 (H) 1.7 - 7.7 K/uL   Lymphs Abs 1.1 0.7 - 4.0 K/uL   Monocytes Absolute 1.7 (H) 0.1 - 1.0 K/uL   Eosinophils Absolute 0.0 0.0 - 0.7 K/uL   Basophils Absolute 0.0 0.0 - 0.1 K/uL   WBC Morphology MILD LEFT SHIFT (1-5% METAS, OCC MYELO, OCC BANDS)     Comment: Performed at [facility] [address], [place], [place] [address]  Comprehensive metabolic panel     Status: Abnormal   Collection Time: 04/17/18  4:23 AM  Result Value Ref Range   Sodium 141 135 - 145 mmol/L   Potassium 4.4 3.5 - 5.1 mmol/L    Comment: SPECIMEN HEMOLYZED. HEMOLYSIS MAY AFFECT INTEGRITY OF RESULTS.   Chloride 103 101 - 111 mmol/L   CO2 24 22 - 32 mmol/L   Glucose, Bld 166 (H) 65 - 99 mg/dL   BUN 14 6 - 20 mg/dL   Creatinine, Ser 1.05 0.61 - 1.24 mg/dL   Calcium 9.6 8.9 - 10.3 mg/dL   Total Protein 7.5 6.5 - 8.1 g/dL   Albumin 4.2 3.5 - 5.0 g/dL   AST 46 (H) 15 - 41 U/L   ALT 87 (H) 17 - 63 U/L   Alkaline Phosphatase 147 (H) 38 - 126 U/L   Total Bilirubin 1.4 (H) 0.3 - 1.2 mg/dL   GFR calc non Af Amer  >60 >60 mL/min   GFR calc Af Amer >60 >60 mL/min    Comment: (NOTE) The eGFR has been calculated using the CKD EPI equation. This calculation has not been validated in all clinical situations. eGFR's persistently <60 mL/min signify possible Chronic Kidney Disease.    Anion gap 14 5 - 15    Comment: Performed at [facility] [address], [place], [place] [address]  Lipase, blood     Status: None   Collection Time: 04/17/18  4:23 AM  Result Value Ref Range   Lipase 25 11 - 51 U/L    Comment: Performed at [facility] [address], [place], [place] [address]  POC occult blood, ED RN will collect     Status: None   Collection Time: 04/17/18  4:23 AM  Result Value Ref Range   Fecal Occult Bld NEGATIVE NEGATIVE  Type and screen [place]     Status: None   Collection Time: 04/17/18  4:40 AM  Result Value Ref Range   ABO/RH(D) O POS    Antibody Screen NEG    Sample Expiration      04/20/2018 Performed at [facility], [place] [address], [place], [place] [address]   ABO/Rh     Status: None   Collection Time: 04/17/18  4:40 AM  Result Value Ref Range   ABO/RH(D)      O POS Performed at [facility] [address], [place], [place] [address]   I-Stat CG4 Lactic Acid, ED     Status: Abnormal   Collection Time: 04/17/18  4:59 AM  Result Value Ref Range   Lactic Acid, Venous 4.39 (HH) 0.5 - 1.9 mmol/L   Comment NOTIFIED PHYSICIAN   Urinalysis, Routine w reflex microscopic     Status: Abnormal   Collection Time: 04/17/18  6:56 AM  Result Value Ref Range   Color, Urine YELLOW YELLOW   APPearance  HAZY (A) CLEAR   Specific Gravity, Urine 1.032 (H) 1.005 - 1.030   pH 5.0 5.0 - 8.0   Glucose, UA NEGATIVE NEGATIVE mg/dL   Hgb urine dipstick SMALL (A) NEGATIVE   Bilirubin Urine NEGATIVE NEGATIVE   Ketones, ur 20 (A) NEGATIVE mg/dL   Protein, ur 100 (A) NEGATIVE mg/dL   Nitrite NEGATIVE NEGATIVE   Leukocytes, UA NEGATIVE NEGATIVE   RBC /  HPF 6-10 0 - 5 RBC/hpf   WBC, UA 0-5 0 - 5 WBC/hpf   Bacteria, UA FEW (A) NONE SEEN   Mucus PRESENT    Hyaline Casts, UA PRESENT    Granular Casts, UA PRESENT     Comment: Performed at [facility] [address], [place], [place] [address]  I-Stat CG4 Lactic Acid, ED     Status: Abnormal   Collection Time: 04/17/18  7:10 AM  Result Value Ref Range   Lactic Acid, Venous 4.96 (HH) 0.5 - 1.9 mmol/L   Comment NOTIFIED PHYSICIAN   CBG monitoring, ED     Status: Abnormal   Collection Time: 04/17/18  8:17 AM  Result Value Ref Range   Glucose-Capillary 161 (H) 65 - 99 mg/dL  CBG monitoring, ED     Status: Abnormal   Collection Time: 04/17/18 12:02 PM  Result Value Ref Range   Glucose-Capillary 181 (H) 65 - 99 mg/dL   Dg Abdomen 1 View  Result Date: 04/17/2018 CLINICAL DATA:  Nasogastric tube placement EXAM: ABDOMEN - 1 VIEW COMPARISON:  CT abdomen and pelvis Apr 17, 2018 FINDINGS: Nasogastric tube tip and side port are in the stomach. There is mild gastric dilatation with air as well as dilatation of multiple loops of small bowel, similar to CT performed earlier in the day. No free air. Lung bases clear. Portal venous air seen on recent CT is not appreciable by radiography. IMPRESSION: Nasogastric tube tip and side port in stomach. Dilated bowel remains. A degree of bowel obstruction must be of concern. No free air evident. Lung bases clear. Electronically Signed   By: Lowella Grip III M.D.   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 Cecum is in normal position in the right lower quadrant. Swirling pattern of small bowel mesentery is seen in the right abdomen, consistent with volvulus and partial small bowel obstruction. Mild wall thickening of dilated small bowel loops is seen as well as  possible mild pneumatosis (in addition to portal venous gas), suspicious for small bowel ischemia. These findings were discussed with Dr. Barry Dienes at the time of this addendum on 04/17/2018 at 1215 hours. Electronically Signed   By: Earle Gell M.D.   On: 04/17/2018 12:22   Result Date: 04/17/2018 CLINICAL DATA:  Acute onset of generalized abdominal pain, diarrhea and hematemesis. EXAM: CT ABDOMEN AND PELVIS WITH CONTRAST TECHNIQUE: Multidetector CT imaging of the abdomen and pelvis was performed using the standard protocol following bolus administration of intravenous contrast. CONTRAST:  134m OMNIPAQUE IOHEXOL 300 MG/ML  SOLN COMPARISON:  CT of the abdomen and pelvis performed 04/23/2014, and abdominal radiograph performed earlier today at 5:48 a.m. FINDINGS: Lower chest: Minimal bibasilar atelectasis is noted. The visualized portions of the mediastinum are unremarkable. Hepatobiliary: Portal venous gas is noted within the liver, of uncertain etiology. The liver is otherwise unremarkable in appearance. The gallbladder  is grossly unremarkable. The common bile duct is normal in caliber. Pancreas: The pancreas is within normal limits. Spleen: The spleen is unremarkable in appearance. Adrenals/Urinary Tract: The adrenal glands are unremarkable in appearance. 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 Left renal cysts are also noted. There is no evidence of hydronephrosis. No renal or ureteral stones are identified. Nonspecific perinephric stranding is noted bilaterally. Stomach/Bowel: The stomach is unremarkable in appearance. There is distention of small-bowel loops to 4.0 cm in maximal diameter, with gradual decompression at the right lower quadrant. No focal transition point is seen. This may simply reflect some degree of small bowel dysmotility. Slight wall thickening is suggested along the mid ileum, which could reflect a mild infectious or inflammatory process. There is no evidence of  pneumatosis or bowel ischemia on provided images to explain the patient's portal venous gas. The patient is status post appendectomy. The colon is unremarkable in appearance. Vascular/Lymphatic: Scattered calcification is seen along the abdominal aorta and its branches. The abdominal aorta is otherwise grossly unremarkable. The inferior vena cava is grossly unremarkable. No retroperitoneal lymphadenopathy is seen. No pelvic sidewall lymphadenopathy is identified. Reproductive: The bladder is mildly distended and grossly unremarkable. The prostate is enlarged, measuring 5.9 cm in transverse dimension, with heterogeneity. Other: No additional soft tissue abnormalities are seen. Musculoskeletal: No acute osseous abnormalities are identified. The visualized musculature is unremarkable in appearance. IMPRESSION: 1. Portal venous gas within the liver, of uncertain etiology. No evidence of pneumatosis or bowel ischemia on provided images. 2. Distention of small-bowel loops to 4.0 cm in maximal diameter, with gradual decompression at the right lower quadrant. This may simply reflect some degree of small bowel dysmotility; no evidence of bowel obstruction. 3. Slight wall thickening along the mid ileum could reflect a mild infectious or inflammatory process. 4. Enlarged prostate.  Would correlate with PSA. 5. 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 Aortic Atherosclerosis (ICD10-I70.0). These results were called by telephone at the time of interpretation on 04/17/2018 at 6:42 am to Dr. Thayer Jew , who verbally acknowledged these results. Electronically Signed: By: Garald Balding M.D. On: 04/17/2018 06:46   Dg Abdomen Acute W/chest  Result Date: 04/17/2018 CLINICAL DATA:  Acute onset of vomiting. EXAM: DG ABDOMEN ACUTE W/ 1V CHEST COMPARISON:  Chest radiograph performed 06/03/2017, and lumbar spine radiographs performed 06/02/2013 FINDINGS: The lungs are hypoexpanded but appear grossly clear. There  is no evidence of focal opacification, pleural effusion or pneumothorax. The cardiomediastinal silhouette is within normal limits. There is distention of small-bowel loops to 4.5 cm in maximal diameter, concerning for some degree of small bowel obstruction. The colon is not well assessed. The stomach is largely filled with air and fluid. No free intra-abdominal air is identified on the provided upright view. No acute osseous abnormalities are seen; the sacroiliac joints are unremarkable in appearance. IMPRESSION: 1. Dilatation of small-bowel loops to 4.5 cm in maximal diameter, concerning for some degree of small bowel obstruction. No free intra-abdominal air seen. 2. Lungs hypoexpanded but grossly clear. These results were called by telephone at the time of interpretation on 04/17/2018 at 6:20 am to Dr. Thayer Jew, who verbally acknowledged these results. Electronically Signed   By: Garald Balding M.D.   On: 04/17/2018 06:21   US Abdomen Limited Ruq  Result Date: 04/17/2018 CLINICAL DATA:  77 year old male with abnormal LFTs. Portal venous gas suspected within the liver on CT today. EXAM: ULTRASOUND ABDOMEN LIMITED RIGHT UPPER QUADRANT COMPARISON:  CT Abdomen and Pelvis 0618 hours today. FINDINGS: Gallbladder: No gallstones or wall thickening visualized. No sonographic Murphy sign noted by sonographer. Common bile duct: Diameter: 3 millimeters, within normal limits (images 11 and 12). Liver: Heterogeneous echotexture with shadowing echogenic foci in the anterior left hepatic lobe likely corresponding to the abnormal gas seen by CT. Background liver echogenicity may be abnormally decreased, uncertain. Portal vein is patent on color Doppler imaging with normal direction of blood flow towards the liver. Other findings: No free fluid identified. Grossly negative visible right kidney. IMPRESSION: 1. The gallbladder and CBD appear normal by ultrasound. 2. Abnormal heterogeneous appearance of the liver in part  related to the parenchymal portal venous gas seen by CT earlier today. Possible but uncertain underlying hepatic edema, such as due to a hepatitis. Electronically Signed   By: Genevie Ann M.D.   On: 04/17/2018 09:42   Anti-infectives (From admission, onward)   Start     Dose/Rate Route Frequency Ordered Stop   04/17/18 1330  piperacillin-tazobactam (ZOSYN) IVPB 3.375 g     3.375 g 12.5 mL/hr over 240 Minutes Intravenous Every 8 hours 04/17/18 0815     04/17/18 1200  piperacillin-tazobactam (ZOSYN) IVPB 3.375 g  Status:  Discontinued     3.375 g 12.5 mL/hr over 240 Minutes Intravenous Every 8 hours 04/17/18 0534 04/17/18 0815   04/17/18 0815  piperacillin-tazobactam (ZOSYN) IVPB 3.375 g  Status:  Discontinued     3.375 g 100 mL/hr over 30 Minutes Intravenous Every 8 hours 04/17/18 0801 04/17/18 0814   04/17/18 0515  piperacillin-tazobactam (ZOSYN) IVPB 3.375 g     3.375 g 100 mL/hr over 30 Minutes Intravenous  Once 04/17/18 0505 04/17/18 1518        Assessment/Plan DM H/o CVA with residual left sided weakness - hold plavix Dementia HTN HLD GERD  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   -the daughter has driven in from [place] who is the patient's HCPOA and who has agreed to proceed with surgical intervention.   -he is on plavix.  We will have platelets available on call to the OR incase we have any bleeding complications. -the procedure itself along with possible risks and complications, including but not limited to bleeding, infection, bowel resection, anastomotic leak, intra-abdominal infection, hernia formation, open wound, multiple operations required, CVA, MI, or death.  The daughter understands and is agreeable to proceed.  ID - zosyn 5/14>> VTE - SCDs FEN - IVF, NPO/NGT Foley - none Follow up - TBD  Plan - To OR for dx lap, possible ex lap.  Matthew Cea  PA-C 2:28 PM 04/17/2018  [facility] Pager [phone]  Seen, examined, agree with above.  Pt with portal venous gas and malrotation.  There is also some thickened bowel in the RUQ.  Suspect that he has detorsed given his resolution of pain, but he likely had transient ischemia and obstruction.  Will evaluate laparoscopically to make sure he does not have diffusely necrotic bowel.    Discussed with patient's daughter.  He does have paraplegia s/p CVA and some dementia with h/o schizophrenia, but is normally alert and conversant.  Discussed risks.   Patient also gives assent.    Stark Klein, MD

## 2018-04-17 NOTE — Op Note (Signed)
PRE-OPERATIVE DIAGNOSIS: malrotation with volvulus  POST-OPERATIVE DIAGNOSIS:  Incomplete malrotation  PROCEDURE:  Procedure(s): Diagnostic laparoscopy, exploratory laparotomy  SURGEON:  Surgeon(s): Almond Lint, MD  ASSIST:  Carman Ching, MD  ANESTHESIA:   general  DRAINS: none   LOCAL MEDICATIONS USED:  BUPIVICAINE  and LIDOCAINE   SPECIMEN:  No Specimen  DISPOSITION OF SPECIMEN:  N/A  COUNTS:  YES  DICTATION: .Dragon Dictation  PLAN OF CARE: Admit to inpatient   PATIENT DISPOSITION:  PACU - hemodynamically stable.  FINDINGS:  Incomplete malrotation, but posteriorly fixed with a right sided ligament of treitz.  No evidence of bowel obstruction or bowel ischemia.  Normal lateral gallbladder with no evidence of cholecystoduodenal fistula.  Normal appearing liver.    EBL: minimal  PROCEDURE:  Patient was identified in the holding area and taken to the operating room where he was placed supine on the operating room table.  General endotracheal anesthesia was induced.  A Foley catheter was placed.  His abdomen was prepped and draped in sterile fashion.  A timeout was performed according to the surgical safety checklist.  When all was correct, we continued.  Patient was placed into reverse Trendelenburg position and rotated to the right.  Local anesthetic was administered at the left costal margin.  The 5 mm Optiview trocar was placed under direct visualization.  Pneumoperitoneum was achieved to a pressure of 15 mmHg.  The camera was inserted in the abdomen and there was no evidence of ischemic bowel.  With the #10 blade.  The subcutaneous tissues were divided with the cautery.  The fascia was entered in the midline with the cautery.  A small upper midline incision was made the bowel was diffusely dilated.  There was an area that was a little hyperemic, but no evidence of transition point.  The bowel was run from proximal to distal and then back from the terminal ileum  proximally.  The small bowel appeared to go just to midline and then turned back to the right.  There was a structure similar to the ligament of Treitz fixing the bowel down on the right side.  The small bowel had no fixation points and no evidence of internal hernia.  There was no evidence of vascular compromise to the bowel.  Bowel did have a few areas that were friable along the mesentery due to the patient's Plavix.  There were 2 areas in the mesentery that were oversewn.  The fascia was then closed using running #1 looped PDS suture.  The skin was irrigated and closed with staples.  The wounds were cleaned, dried, and dressed with a honeycomb dressing.  The patient was allowed to emerge from anesthesia and taken to the PACU in stable condition.  Needle, sponge, and instrument counts were correct x2.

## 2018-04-17 NOTE — H&P (Signed)
History and Physical    Matthew Meza GNF:[id number] DOB: 1940/12/07 DOA: 04/17/2018  PCP: Blair Heys, MD   Patient coming from: SNF    Chief Complaint: N/V, portal venous gas  HPI: Matthew Meza is a 77 y.o. male with medical history significant of CVA with chronic left-sided deficits, Parkinson's disease, type 2 diabetes on insulin, dementia, hyperlipidemia who comes in with one episode of coffee-ground emesis and portal venous gas.  Patient can only provide limited history due to his underlying dementia, CVA, Parkinson's.  Per report he was doing well until he had one episode of nonbilious nonbloody emesis which staff at the skilled nursing facility described as coffee-ground.  He had abdominal pain during this time but he cannot describe to me the quality, duration or any exacerbating or alleviating factors.  He does report that he does not have any abdominal pain right now.  He has not passed any gas or had any diarrhea or bowel movement in 1 day.  He has not had any constipation.  Currently he does not have any abdominal pain.  He does feel mildly nauseous.  ED Course: In the ED labs were notable for tachycardia, tachypnea, mild hypertension.  His white count was 27.8.  He was noted to have hemoconcentration.  His lactate was almost 5.  CT abdomen pelvis showed portal venous gas but no acute process.  He had mild elevation of his LFTs.  Patient was given IV sepsis bolus doses.  And IV Zosyn.  NG tube was placed and small volume of coffee-ground emesis was removed  Review of Systems: As per HPI otherwise 10 point review of systems negative.    Past Medical History:  Diagnosis Date  . Diabetes mellitus without complication (HCC)    type 2  . GERD (gastroesophageal reflux disease)   . Hyperlipidemia   . Hypertension   . No pertinent past medical history   . Parkinson's disease (HCC)   . Right hemiparesis (HCC)   . Stroke [facility]) 04/12/2017    Past Surgical History:    Procedure Laterality Date  . APPENDECTOMY    . ESOPHAGOGASTRODUODENOSCOPY N/A 04/07/2017   Procedure: ESOPHAGOGASTRODUODENOSCOPY (EGD);  Surgeon: Violeta Gelinas, MD;  Location: [facility] ENDOSCOPY;  Service: Endoscopy;  Laterality: N/A;  . IR ANGIO INTRA EXTRACRAN SEL COM CAROTID INNOMINATE UNI L MOD SED  03/31/2017  . IR INTRAVSC STENT CERV CAROTID W/O EMB-PROT MOD SED INC ANGIO  03/31/2017  . IR PERCUTANEOUS ART THROMBECTOMY/INFUSION INTRACRANIAL INC DIAG ANGIO  03/31/2017  . IR REPLC GASTRO/COLONIC TUBE PERCUT W/FLUORO  06/05/2017  . IR US GUIDE VASC ACCESS RIGHT  03/31/2017  . LAPAROSCOPIC APPENDECTOMY  06/21/2012   Procedure: APPENDECTOMY LAPAROSCOPIC;  Surgeon: Kandis Cocking, MD;  Location: [facility] ORS;  Service: General;  Laterality: N/A;  . NO PAST SURGERIES    . PEG PLACEMENT N/A 04/07/2017   Procedure: PERCUTANEOUS ENDOSCOPIC GASTROSTOMY (PEG) PLACEMENT;  Surgeon: Violeta Gelinas, MD;  Location: [facility] ENDOSCOPY;  Service: Endoscopy;  Laterality: N/A;  . RADIOLOGY WITH ANESTHESIA N/A 03/31/2017   Procedure: RADIOLOGY WITH ANESTHESIA;  Surgeon: Gilmer Mor, DO;  Location: [facility] OR;  Service: Anesthesiology;  Laterality: N/A;     reports that he quit smoking about 22 years ago. He quit after 20.00 years of use. He has never used smokeless tobacco. He reports that he does not drink alcohol or use drugs.  Allergies  Allergen Reactions  . No Known Allergies     Family History  Problem Relation Age of Onset  .  Stroke Mother    Unacceptable: Noncontributory, unremarkable, or negative. Acceptable: Family history reviewed and not pertinent (If you reviewed it)  Prior to Admission medications   Medication Sig Start Date End Date Taking? Authorizing Provider  amantadine (SYMMETREL) 50 MG/5ML solution Place 20 mLs (200 mg total) into feeding tube 2 (two) times daily. 04/12/17 04/17/18 Yes Layne Benton, NP  atorvastatin (LIPITOR) 40 MG tablet Place 1 tablet (40 mg total) into feeding tube daily at 6 PM. 04/12/17   Yes Biby, Jani Files, NP  baclofen (LIORESAL) 10 mg/mL SUSP Take 1 mL (10 mg total) by mouth 3 (three) times daily. 04/12/17  Yes Layne Benton, NP  clopidogrel (PLAVIX) 75 MG tablet Place 1 tablet (75 mg total) into feeding tube daily. 04/13/17  Yes Layne Benton, NP  famotidine (PEPCID) 20 MG tablet Place 1 tablet (20 mg total) into feeding tube 2 (two) times daily. 04/12/17  Yes Layne Benton, NP  insulin aspart (NOVOLOG) 100 UNIT/ML injection Inject 8 Units into the skin every 4 (four) hours. Patient taking differently: Inject 8 Units into the skin 4 (four) times daily - after meals and at bedtime.  04/12/17  Yes Layne Benton, NP  ipratropium-albuterol (DUONEB) 0.5-2.5 (3) MG/3ML SOLN Take 3 mLs by nebulization every 4 (four) hours as needed. Patient taking differently: Take 3 mLs by nebulization every 4 (four) hours as needed (SOB).  04/12/17  Yes Layne Benton, NP  polyethylene glycol (MIRALAX / GLYCOLAX) packet Take 17 g by mouth at bedtime.   Yes [provider]  pseudoephedrine-guaifenesin (MUCINEX D) 60-600 MG 12 hr tablet Take 1 tablet by mouth every 12 (twelve) hours.   Yes [provider]  aspirin 325 MG tablet Place 1 tablet (325 mg total) into feeding tube daily. Patient not taking: Reported on 04/17/2018 04/13/17   Layne Benton, NP  feeding supplement, ENSURE ENLIVE, (ENSURE ENLIVE) LIQD Take 237 mLs by mouth 2 (two) times daily between meals. Patient not taking: Reported on 04/17/2018 06/07/17   Vassie Loll, MD    Physical Exam: Vitals:   04/17/18 0500 04/17/18 0538 04/17/18 0600 04/17/18 0705  BP: (!) 162/113  (!) 159/99 (!) 152/114  Pulse: (!) 117  (!) 112 (!) 114  Resp: (!) 23  (!) 22 (!) 22  Temp:      TempSrc:      SpO2: 98%  99% 100%  Weight:  86.2 kg (190 lb)    Height:  6' (1.829 m)      Constitutional: NAD, calm, comfortable Vitals:   04/17/18 0500 04/17/18 0538 04/17/18 0600 04/17/18 0705  BP: (!) 162/113  (!) 159/99 (!) 152/114  Pulse: (!) 117   (!) 112 (!) 114  Resp: (!) 23  (!) 22 (!) 22  Temp:      TempSrc:      SpO2: 98%  99% 100%  Weight:  86.2 kg (190 lb)    Height:  6' (1.829 m)     Eyes: Pinpoint pupils, anicteric sclera ENMT: Dry mucous membranes, NG tube in place Neck: normal, supple, no masses, no thyromegaly Respiratory: clear to auscultation bilaterally, no wheezing, no crackles. Normal respiratory effort. No accessory muscle use.  Cardiovascular: Distant heart sounds, regular rate and rhythm, no murmurs Abdomen: Mildly distended, no rebound or guarding, diminished bowel sounds, no tenderness Musculoskeletal: No lower extremity edema Skin: no rashes on visible skin, prior PEG tube site is clean dry intact Neurologic: Dense left-sided hemiparesis, extremely slow to respond, left-sided  tremor noted Psychiatric: Unable to assess due to underlying dementia, stroke   Labs on Admission: I have personally reviewed following labs and imaging studies  CBC: Recent Labs  Lab 04/17/18 0423  WBC 27.8*  NEUTROABS 25.0*  HGB 19.2*  HCT 56.2*  MCV 95.7  PLT 241   Basic Metabolic Panel: Recent Labs  Lab 04/17/18 0423  NA 141  K 4.4  CL 103  CO2 24  GLUCOSE 166*  BUN 14  CREATININE 1.05  CALCIUM 9.6   GFR: Estimated Creatinine Clearance: 65.7 mL/min (by C-G formula based on SCr of 1.05 mg/dL). Liver Function Tests: Recent Labs  Lab 04/17/18 0423  AST 46*  ALT 87*  ALKPHOS 147*  BILITOT 1.4*  PROT 7.5  ALBUMIN 4.2   Recent Labs  Lab 04/17/18 0423  LIPASE 25   No results for input(s): AMMONIA in the last 168 hours. Coagulation Profile: No results for input(s): INR, PROTIME in the last 168 hours. Cardiac Enzymes: No results for input(s): CKTOTAL, CKMB, CKMBINDEX, TROPONINI in the last 168 hours. BNP (last 3 results) No results for input(s): PROBNP in the last 8760 hours. HbA1C: No results for input(s): HGBA1C in the last 72 hours. CBG: No results for input(s): GLUCAP in the last 168  hours. Lipid Profile: No results for input(s): CHOL, HDL, LDLCALC, TRIG, CHOLHDL, LDLDIRECT in the last 72 hours. Thyroid Function Tests: No results for input(s): TSH, T4TOTAL, FREET4, T3FREE, THYROIDAB in the last 72 hours. Anemia Panel: No results for input(s): VITAMINB12, FOLATE, FERRITIN, TIBC, IRON, RETICCTPCT in the last 72 hours. Urine analysis:    Component Value Date/Time   COLORURINE YELLOW 06/03/2017 1300   APPEARANCEUR HAZY (A) 06/03/2017 1300   LABSPEC 1.019 06/03/2017 1300   PHURINE 6.0 06/03/2017 1300   GLUCOSEU NEGATIVE 06/03/2017 1300   HGBUR SMALL (A) 06/03/2017 1300   BILIRUBINUR NEGATIVE 06/03/2017 1300   BILIRUBINUR neg 04/02/2015 1432   KETONESUR 5 (A) 06/03/2017 1300   PROTEINUR 100 (A) 06/03/2017 1300   UROBILINOGEN 1.0 04/02/2015 1432   UROBILINOGEN 0.2 04/23/2014 1238   NITRITE NEGATIVE 06/03/2017 1300   LEUKOCYTESUR MODERATE (A) 06/03/2017 1300    Radiological Exams on Admission: Ct Abdomen Pelvis W Contrast  Result Date: 04/17/2018 CLINICAL DATA:  Acute onset of generalized abdominal pain, diarrhea and hematemesis. EXAM: CT ABDOMEN AND PELVIS WITH CONTRAST TECHNIQUE: Multidetector CT imaging of the abdomen and pelvis was performed using the standard protocol following bolus administration of intravenous contrast. CONTRAST:  OMNIPAQUE IOHEXOL 300 MG/ML  SOLN COMPARISON:  CT of the abdomen and pelvis performed 04/23/2014, and abdominal radiograph performed earlier today at 5:48 a.m. FINDINGS: Lower chest: Minimal bibasilar atelectasis is noted. The visualized portions of the mediastinum are unremarkable. Hepatobiliary: Portal venous gas is noted within the liver, of uncertain etiology. The liver is otherwise unremarkable in appearance. The gallbladder is grossly unremarkable. The common bile duct is normal in caliber. Pancreas: The pancreas is within normal limits. Spleen: The spleen is unremarkable in appearance. Adrenals/Urinary Tract: The adrenal glands  are unremarkable in appearance. 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 Left renal cysts are also noted. There is no evidence of hydronephrosis. No renal or ureteral stones are identified. Nonspecific perinephric stranding is noted bilaterally. Stomach/Bowel: The stomach is unremarkable in appearance. There is distention of small-bowel loops to 4.0 cm in maximal diameter, with gradual decompression at the right lower quadrant. No focal transition point is seen. This may simply reflect some degree of small bowel  dysmotility. Slight wall thickening is suggested along the mid ileum, which could reflect a mild infectious or inflammatory process. There is no evidence of pneumatosis or bowel ischemia on provided images to explain the patient's portal venous gas. The patient is status post appendectomy. The colon is unremarkable in appearance. Vascular/Lymphatic: Scattered calcification is seen along the abdominal aorta and its branches. The abdominal aorta is otherwise grossly unremarkable. The inferior vena cava is grossly unremarkable. No retroperitoneal lymphadenopathy is seen. No pelvic sidewall lymphadenopathy is identified. Reproductive: The bladder is mildly distended and grossly unremarkable. The prostate is enlarged, measuring 5.9 cm in transverse dimension, with heterogeneity. Other: No additional soft tissue abnormalities are seen. Musculoskeletal: No acute osseous abnormalities are identified. The visualized musculature is unremarkable in appearance. IMPRESSION: 1. Portal venous gas within the liver, of uncertain etiology. No evidence of pneumatosis or bowel ischemia on provided images. 2. Distention of small-bowel loops to 4.0 cm in maximal diameter, with gradual decompression at the right lower quadrant. This may simply reflect some degree of small bowel dysmotility; no evidence of bowel obstruction. 3. Slight wall thickening along the mid ileum could reflect a mild infectious or  inflammatory process. 4. Enlarged prostate.  Would correlate with PSA. 5. 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 Aortic Atherosclerosis (ICD10-I70.0). These results were called by telephone at the time of interpretation on 04/17/2018 at 6:42 am to Dr. Ross Marcus , who verbally acknowledged these results. Electronically Signed   By: Roanna Raider M.D.   On: 04/17/2018 06:46   Dg Abdomen Acute W/chest  Result Date: 04/17/2018 CLINICAL DATA:  Acute onset of vomiting. EXAM: DG ABDOMEN ACUTE W/ 1V CHEST COMPARISON:  Chest radiograph performed 06/03/2017, and lumbar spine radiographs performed 06/02/2013 FINDINGS: The lungs are hypoexpanded but appear grossly clear. There is no evidence of focal opacification, pleural effusion or pneumothorax. The cardiomediastinal silhouette is within normal limits. There is distention of small-bowel loops to 4.5 cm in maximal diameter, concerning for some degree of small bowel obstruction. The colon is not well assessed. The stomach is largely filled with air and fluid. No free intra-abdominal air is identified on the provided upright view. No acute osseous abnormalities are seen; the sacroiliac joints are unremarkable in appearance. IMPRESSION: 1. Dilatation of small-bowel loops to 4.5 cm in maximal diameter, concerning for some degree of small bowel obstruction. No free intra-abdominal air seen. 2. Lungs hypoexpanded but grossly clear. These results were called by telephone at the time of interpretation on 04/17/2018 at 6:20 am to Dr. Ross Marcus, who verbally acknowledged these results. Electronically Signed   By: Roanna Raider M.D.   On: 04/17/2018 06:21    EKG: Independently reviewed.  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  Certainly he does appear to be dehydrated.  Cannot find a great expiration for this coffee-ground emesis with his portal venous gas on CT scan or exam as  his exam abdominal exam is extremely benign. -N.p.o. -Continue NG -IV Zosyn -Status post IV fluids for sepsis, continue IV hydration -General surgery consultation -IV famotidine  #) Elevated LFTs: Mild, noted to have elevations that is both mixed hepatocellular and cholestatic pattern.  Unclear if contributing to current symptoms. -Right upper quadrant ultrasound -N.p.o. per above -IV Zosyn  #) CVA with dense left-sided deficits: - Hold clopidogrel 75 mg daily -Continue baclofen 3 times daily  #) Parkinson's: Suspect this is most likely related to stroke. -Continue amantadine 200 mg twice daily  #) Type 2 diabetes: -Hold scheduled insulin with meals -Sliding scale insulin every 4 hours  #) Hyperlipidemia: -Continue atorvastatin 40 mg daily   Fluids: Gentle IV fluids Elect lites: Monitor and supplement Nutrition: N.p.o.  Prophylaxis: Enoxaparin  Disposition: Pending return of bowel function  Full code    Delaine Lame MD [facility]   If 7PM-7AM, please contact night-coverage [email or web address] Password [facility]  04/17/2018, 7:36 AM

## 2018-04-17 NOTE — Progress Notes (Signed)
Return call from Dr. Bradd Canary Hospitalist, he said to page Dr. Clearence Ped, who I paged first but no return call hence I paged Dr. Clyde Lundborg. Will paged Dr. Clearence Ped again.

## 2018-04-18 ENCOUNTER — Inpatient Hospital Stay (HOSPITAL_COMMUNITY): Payer: Medicare HMO

## 2018-04-18 ENCOUNTER — Encounter (HOSPITAL_COMMUNITY): Payer: Self-pay | Admitting: General Surgery

## 2018-04-18 DIAGNOSIS — I639 Cerebral infarction, unspecified: Secondary | ICD-10-CM

## 2018-04-18 DIAGNOSIS — E785 Hyperlipidemia, unspecified: Secondary | ICD-10-CM

## 2018-04-18 DIAGNOSIS — A419 Sepsis, unspecified organism: Principal | ICD-10-CM

## 2018-04-18 DIAGNOSIS — I10 Essential (primary) hypertension: Secondary | ICD-10-CM

## 2018-04-18 DIAGNOSIS — I69354 Hemiplegia and hemiparesis following cerebral infarction affecting left non-dominant side: Secondary | ICD-10-CM

## 2018-04-18 DIAGNOSIS — E118 Type 2 diabetes mellitus with unspecified complications: Secondary | ICD-10-CM

## 2018-04-18 LAB — PREPARE PLATELET PHERESIS: UNIT DIVISION: 0

## 2018-04-18 LAB — BASIC METABOLIC PANEL
ANION GAP: 6 (ref 5–15)
BUN: 10 mg/dL (ref 6–20)
CHLORIDE: 111 mmol/L (ref 101–111)
CO2: 24 mmol/L (ref 22–32)
Calcium: 7.7 mg/dL — ABNORMAL LOW (ref 8.9–10.3)
Creatinine, Ser: 0.84 mg/dL (ref 0.61–1.24)
GFR calc Af Amer: >60 mL/min (ref >60)
GFR calc non Af Amer: >60 mL/min (ref >60)
Glucose, Bld: 142 mg/dL — ABNORMAL HIGH (ref 65–99)
POTASSIUM: 4 mmol/L (ref 3.5–5.1)
SODIUM: 141 mmol/L (ref 135–145)

## 2018-04-18 LAB — HEPATIC FUNCTION PANEL
ALK PHOS: 67 U/L (ref 38–126)
ALT: 36 U/L (ref 17–63)
AST: 21 U/L (ref 15–41)
Albumin: 2.9 g/dL — ABNORMAL LOW (ref 3.5–5.0)
BILIRUBIN DIRECT: 0.3 mg/dL (ref 0.1–0.5)
Indirect Bilirubin: 0.9 mg/dL (ref 0.3–0.9)
Total Bilirubin: 1.2 mg/dL (ref 0.3–1.2)
Total Protein: 4.9 g/dL — ABNORMAL LOW (ref 6.5–8.1)

## 2018-04-18 LAB — MRSA PCR SCREENING: MRSA BY PCR: NEGATIVE

## 2018-04-18 LAB — GLUCOSE, CAPILLARY
Glucose-Capillary: 130 mg/dL — ABNORMAL HIGH (ref 65–99)
Glucose-Capillary: 108 mg/dL — ABNORMAL HIGH (ref 65–99)
Glucose-Capillary: 92 mg/dL (ref 65–99)
Glucose-Capillary: 99 mg/dL (ref 65–99)
Glucose-Capillary: 114 mg/dL — ABNORMAL HIGH (ref 65–99)

## 2018-04-18 LAB — COMPREHENSIVE METABOLIC PANEL
ALT: 36 U/L (ref 17–63)
AST: 23 U/L (ref 15–41)
Albumin: 2.8 g/dL — ABNORMAL LOW (ref 3.5–5.0)
Alkaline Phosphatase: 63 U/L (ref 38–126)
Anion gap: 8 (ref 5–15)
BUN: 9 mg/dL (ref 6–20)
CO2: 22 mmol/L (ref 22–32)
Calcium: 7.1 mg/dL — ABNORMAL LOW (ref 8.9–10.3)
Chloride: 113 mmol/L — ABNORMAL HIGH (ref 101–111)
Creatinine, Ser: 0.95 mg/dL (ref 0.61–1.24)
GFR calc Af Amer: >60 mL/min (ref >60)
GFR calc non Af Amer: >60 mL/min (ref >60)
Glucose, Bld: 125 mg/dL — ABNORMAL HIGH (ref 65–99)
Potassium: 3.8 mmol/L (ref 3.5–5.1)
Sodium: 143 mmol/L (ref 135–145)
Total Bilirubin: 0.9 mg/dL (ref 0.3–1.2)
Total Protein: 4.6 g/dL — ABNORMAL LOW (ref 6.5–8.1)

## 2018-04-18 LAB — CBC
HCT: 37.9 % — ABNORMAL LOW (ref 39.0–52.0)
HEMOGLOBIN: 12.2 g/dL — AB (ref 13.0–17.0)
MCH: 30.6 pg (ref 26.0–34.0)
MCHC: 32.2 g/dL (ref 30.0–36.0)
MCV: 95 fL (ref 78.0–100.0)
PLATELETS: 148 10*3/uL — AB (ref 150–400)
RBC: 3.99 MIL/uL — ABNORMAL LOW (ref 4.22–5.81)
RDW: 13.8 % (ref 11.5–15.5)
WBC: 13.7 10*3/uL — AB (ref 4.0–10.5)

## 2018-04-18 LAB — BPAM PLATELET PHERESIS
Blood Product Expiration Date: 201905162359
Unit Type and Rh: 6200

## 2018-04-18 LAB — LACTIC ACID, PLASMA
LACTIC ACID, VENOUS: 1.6 mmol/L (ref 0.5–1.9)
Lactic Acid, Venous: 2.3 mmol/L (ref 0.5–1.9)
Lactic Acid, Venous: 1.1 mmol/L (ref 0.5–1.9)

## 2018-04-18 MED ORDER — SODIUM CHLORIDE 0.9 % IV BOLUS
500.0000 mL | Freq: Once | INTRAVENOUS | Status: AC
  Administered 2018-04-18: 500 mL via INTRAVENOUS

## 2018-04-18 NOTE — Progress Notes (Signed)
Patient ID: Matthew Meza, male   DOB: Mar 22, 1941, 77 y.o.   MRN: [id number]    1 Day Post-Op  Subjective: Patient won't answer direct questions very well this morning, although he does answer his orientation questions correctly.  He is using his right hand to trace the lines in the ceiling in the air. Unable to tell me if he has passed flatus.  Objective: Vital signs in last 24 hours: Temp:  [97.5 F (36.4 C)-99.7 F (37.6 C)] 98.7 F (37.1 C) (05/15 0427) Pulse Rate:  [98-169] 101 (05/15 0700) Resp:  [7-23] 14 (05/15 0700) BP: (102-181)/(59-117) 103/62 (05/15 0700) SpO2:  [88 %-100 %] 100 % (05/15 0700) Arterial Line BP: (168-205)/(69-112) 168/69 (05/14 2023) Last BM Date: (unknown-pta)  Intake/Output from previous day: 05/14 0701 - 05/15 0700 In: 5675 [I.V.:1775; IV Piggyback:3900] Out: 2625 [Urine:500; Emesis/NG output:900; Blood:25] Intake/Output this shift: No intake/output data recorded.  PE: Abd: soft, hypoactive BS, some distention, appropriately tender, NGT with minimal output this morning, but put out over 2.5L since placement yesterday.  Midline incision is clean and intact.  Some old bloody drainage on honeycomb dressing. Heart: tachy, but regular Lungs: CTAB  Lab Results:  Recent Labs    04/17/18 2305 04/18/18 0213  WBC 14.4* 13.7*  HGB 12.0* 12.2*  HCT 37.1* 37.9*  PLT 159 148*   BMET Recent Labs    04/17/18 2305 04/18/18 0213  NA 143 141  K 3.8 4.0  CL 113* 111  CO2 22 24  GLUCOSE 125* 142*  BUN 9 10  CREATININE 0.95 0.84  CALCIUM 7.1* 7.7*   PT/INR No results for input(s): LABPROT, INR in the last 72 hours. CMP     Component Value Date/Time   NA 141 04/18/2018 0213   K 4.0 04/18/2018 0213   CL 111 04/18/2018 0213   CO2 24 04/18/2018 0213   GLUCOSE 142 (H) 04/18/2018 0213   BUN 10 04/18/2018 0213   CREATININE 0.84 04/18/2018 0213   CREATININE 1.16 08/10/2016 1324   CALCIUM 7.7 (L) 04/18/2018 0213   PROT 4.9 (L) 04/18/2018 0213     ALBUMIN 2.9 (L) 04/18/2018 0213   AST 21 04/18/2018 0213   ALT 36 04/18/2018 0213   ALKPHOS 67 04/18/2018 0213   BILITOT 1.2 04/18/2018 0213   GFRNONAA >60 04/18/2018 0213   GFRAA >60 04/18/2018 0213   Lipase     Component Value Date/Time   LIPASE 25 04/17/2018 0423       Studies/Results: Dg Abdomen 1 View  Result Date: 04/17/2018 CLINICAL DATA:  Nasogastric tube placement EXAM: ABDOMEN - 1 VIEW COMPARISON:  CT abdomen and pelvis Apr 17, 2018 FINDINGS: Nasogastric tube tip and side port are in the stomach. There is mild gastric dilatation with air as well as dilatation of multiple loops of small bowel, similar to CT performed earlier in the day. No free air. Lung bases clear. Portal venous air seen on recent CT is not appreciable by radiography. IMPRESSION: Nasogastric tube tip and side port in stomach. Dilated bowel remains. A degree of bowel obstruction must be of concern. No free air evident. Lung bases clear. Electronically Signed   By: Bretta Bang III M.D.   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 Cecum is in normal position in the right lower quadrant. Swirling pattern  of small bowel mesentery is seen in the right abdomen, consistent with volvulus and partial small bowel obstruction. Mild wall thickening of dilated small bowel loops is seen as well as possible mild pneumatosis (in addition to portal venous gas), suspicious for small bowel ischemia. These findings were discussed with Dr. Donell Beers at the time of this addendum on 04/17/2018 at 1215 hours. Electronically Signed   By: Myles Rosenthal M.D.   On: 04/17/2018 12:22   Result Date: 04/17/2018 CLINICAL DATA:  Acute onset of generalized abdominal pain, diarrhea and hematemesis. EXAM: CT ABDOMEN AND PELVIS WITH CONTRAST TECHNIQUE:  Multidetector CT imaging of the abdomen and pelvis was performed using the standard protocol following bolus administration of intravenous contrast. CONTRAST:  OMNIPAQUE IOHEXOL 300 MG/ML  SOLN COMPARISON:  CT of the abdomen and pelvis performed 04/23/2014, and abdominal radiograph performed earlier today at 5:48 a.m. FINDINGS: Lower chest: Minimal bibasilar atelectasis is noted. The visualized portions of the mediastinum are unremarkable. Hepatobiliary: Portal venous gas is noted within the liver, of uncertain etiology. The liver is otherwise unremarkable in appearance. The gallbladder is grossly unremarkable. The common bile duct is normal in caliber. Pancreas: The pancreas is within normal limits. Spleen: The spleen is unremarkable in appearance. Adrenals/Urinary Tract: The adrenal glands are unremarkable in appearance. 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 Left renal cysts are also noted. There is no evidence of hydronephrosis. No renal or ureteral stones are identified. Nonspecific perinephric stranding is noted bilaterally. Stomach/Bowel: The stomach is unremarkable in appearance. There is distention of small-bowel loops to 4.0 cm in maximal diameter, with gradual decompression at the right lower quadrant. No focal transition point is seen. This may simply reflect some degree of small bowel dysmotility. Slight wall thickening is suggested along the mid ileum, which could reflect a mild infectious or inflammatory process. There is no evidence of pneumatosis or bowel ischemia on provided images to explain the patient's portal venous gas. The patient is status post appendectomy. The colon is unremarkable in appearance. Vascular/Lymphatic: Scattered calcification is seen along the abdominal aorta and its branches. The abdominal aorta is otherwise grossly unremarkable. The inferior vena cava is grossly unremarkable. No retroperitoneal lymphadenopathy is seen. No pelvic sidewall  lymphadenopathy is identified. Reproductive: The bladder is mildly distended and grossly unremarkable. The prostate is enlarged, measuring 5.9 cm in transverse dimension, with heterogeneity. Other: No additional soft tissue abnormalities are seen. Musculoskeletal: No acute osseous abnormalities are identified. The visualized musculature is unremarkable in appearance. IMPRESSION: 1. Portal venous gas within the liver, of uncertain etiology. No evidence of pneumatosis or bowel ischemia on provided images. 2. Distention of small-bowel loops to 4.0 cm in maximal diameter, with gradual decompression at the right lower quadrant. This may simply reflect some degree of small bowel dysmotility; no evidence of bowel obstruction. 3. Slight wall thickening along the mid ileum could reflect a mild infectious or inflammatory process. 4. Enlarged prostate.  Would correlate with PSA. 5. 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 Aortic Atherosclerosis (ICD10-I70.0). These results were called by telephone at the time of interpretation on 04/17/2018 at 6:42 am to Dr. Ross Marcus , who verbally acknowledged these results. Electronically Signed: By: Roanna Raider M.D. On: 04/17/2018 06:46   Dg Chest Port 1 View  Result Date: 04/17/2018 CLINICAL DATA:  Central venous catheter EXAM: PORTABLE CHEST 1 VIEW COMPARISON:  04/17/2018, 06/03/2017 FINDINGS: Left-sided central venous catheter tip overlies the brachiocephalic confluence. No pneumothorax. Low lung volumes. Atelectasis  versus minimal infiltrate at the right base. Probable tiny effusions. Cardiomediastinal silhouette within normal limits. No pneumothorax. Esophageal tube tip below the diaphragm but not included on the image. IMPRESSION: 1. Left-sided central venous catheter tip overlies the brachiocephalic confluence. No pneumothorax. 2. Low lung volumes with probable tiny effusions. Atelectasis versus minimal infiltrate at the right base Electronically Signed    By: Jasmine Pang M.D.   On: 04/17/2018 20:02   Dg Abdomen Acute W/chest  Result Date: 04/17/2018 CLINICAL DATA:  Acute onset of vomiting. EXAM: DG ABDOMEN ACUTE W/ 1V CHEST COMPARISON:  Chest radiograph performed 06/03/2017, and lumbar spine radiographs performed 06/02/2013 FINDINGS: The lungs are hypoexpanded but appear grossly clear. There is no evidence of focal opacification, pleural effusion or pneumothorax. The cardiomediastinal silhouette is within normal limits. There is distention of small-bowel loops to 4.5 cm in maximal diameter, concerning for some degree of small bowel obstruction. The colon is not well assessed. The stomach is largely filled with air and fluid. No free intra-abdominal air is identified on the provided upright view. No acute osseous abnormalities are seen; the sacroiliac joints are unremarkable in appearance. IMPRESSION: 1. Dilatation of small-bowel loops to 4.5 cm in maximal diameter, concerning for some degree of small bowel obstruction. No free intra-abdominal air seen. 2. Lungs hypoexpanded but grossly clear. These results were called by telephone at the time of interpretation on 04/17/2018 at 6:20 am to Dr. Ross Marcus, who verbally acknowledged these results. Electronically Signed   By: Roanna Raider M.D.   On: 04/17/2018 06:21   US Abdomen Limited Ruq  Result Date: 04/17/2018 CLINICAL DATA:  77 year old male with abnormal LFTs. Portal venous gas suspected within the liver on CT today. EXAM: ULTRASOUND ABDOMEN LIMITED RIGHT UPPER QUADRANT COMPARISON:  CT Abdomen and Pelvis 0618 hours today. FINDINGS: Gallbladder: No gallstones or wall thickening visualized. No sonographic Murphy sign noted by sonographer. Common bile duct: Diameter: 3 millimeters, within normal limits (images 11 and 12). Liver: Heterogeneous echotexture with shadowing echogenic foci in the anterior left hepatic lobe likely corresponding to the abnormal gas seen by CT. Background liver echogenicity  may be abnormally decreased, uncertain. Portal vein is patent on color Doppler imaging with normal direction of blood flow towards the liver. Other findings: No free fluid identified. Grossly negative visible right kidney. IMPRESSION: 1. The gallbladder and CBD appear normal by ultrasound. 2. Abnormal heterogeneous appearance of the liver in part related to the parenchymal portal venous gas seen by CT earlier today. Possible but uncertain underlying hepatic edema, such as due to a hepatitis. Electronically Signed   By: Odessa Fleming M.D.   On: 04/17/2018 09:42    Anti-infectives: Anti-infectives (From admission, onward)   Start     Dose/Rate Route Frequency Ordered Stop   04/17/18 1330  piperacillin-tazobactam (ZOSYN) IVPB 3.375 g     3.375 g 12.5 mL/hr over 240 Minutes Intravenous Every 8 hours 04/17/18 0815     04/17/18 1200  piperacillin-tazobactam (ZOSYN) IVPB 3.375 g  Status:  Discontinued     3.375 g 12.5 mL/hr over 240 Minutes Intravenous Every 8 hours 04/17/18 0534 04/17/18 0815   04/17/18 0815  piperacillin-tazobactam (ZOSYN) IVPB 3.375 g  Status:  Discontinued     3.375 g 100 mL/hr over 30 Minutes Intravenous Every 8 hours 04/17/18 0801 04/17/18 0814   04/17/18 0515  piperacillin-tazobactam (ZOSYN) IVPB 3.375 g     3.375 g 100 mL/hr over 30 Minutes Intravenous  Once 04/17/18 0505 04/17/18 1610  Assessment/Plan  DM H/o CVA with residual left sided weakness - hold plavix Dementia HTN HLD GERD  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  He did have some hyperemic appears bowel with dilatation, but no definite malrotation at the time or surgery or compromised bowel. -labs have all improved today. -cont NGT today to await bowel function -mobilize and pulm toilet.  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   FEN - NPO/NGT VTE - Lovenox to start today at  1500/SCDs ID - zosyn; however, no evidence of infection intra-abdominally.  Will defer further abx therapy to primary service.  Dispo - await bowel function, PT eval for mobilization   LOS: 1 day    Letha Cape , [facility] 04/18/2018, 10:00 AM Pager: [phone]

## 2018-04-18 NOTE — Progress Notes (Signed)
Initial Nutrition Assessment  DOCUMENTATION CODES:   Not applicable  INTERVENTION:  Monitor for diet advancement   NUTRITION DIAGNOSIS:   Inadequate oral intake related to inability to eat as evidenced by NPO status  GOAL:   Patient will meet greater than or equal to 90% of their needs  MONITOR:   Diet advancement, Weight trends, I & O's  REASON FOR ASSESSMENT:   Low Braden    ASSESSMENT:   Matthew Meza is a 77 y.o. male with medical history significant of CVA with chronic left-sided deficits, Parkinson's disease, type 2 diabetes on insulin, dementia, hyperlipidemia who comes in with one episode of coffee-ground emesis and portal venous gas. 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  RD drawn to patient for low braden.  Spoke with daughter at bedside. She states patient has schizophrenia and alienated his brothers and sisters, and children, so she does not know much history about the patient. She does state he appears to have lost weight since she last saw him over a year ago. She is unsure how much. She also reports he was receiving good food at [facility], but she is unsure what his PO intake was like. Per chart his weight appears stable from previous admissions. He was 187 pounds 05/2017 and 189 pounds 06/2017. Currently has NG tube to suction. output for last 24 hrs. NFPE appears ok. Monitor for diet advancement.  Labs reviewed Medications reviewed and include:  Insulin NS at 46mL/hr  NUTRITION - FOCUSED PHYSICAL EXAM:    Most Recent Value  Orbital Region  Mild depletion  Upper Arm Region  No depletion  Thoracic and Lumbar Region  Unable to assess  Buccal Region  Mild depletion  Temple Region  Mild depletion  Clavicle Bone Region  No depletion  Clavicle and Acromion Bone Region  No depletion  Scapular Bone Region  Unable to assess  Dorsal Hand  No depletion  Patellar Region  No  depletion  Anterior Thigh Region  No depletion  Posterior Calf Region  No depletion  Edema (RD Assessment)  Mild       Diet Order:   Diet Order           Diet NPO time specified Except for: Sips with Meds, Ice Chips  Diet effective now          EDUCATION NEEDS:   Not appropriate for education at this time  Skin:  Skin Assessment: Reviewed RN Assessment(closed incision to abdomen)  Last BM:  PTA  Height:   Ht Readings from Last 1 Encounters:  04/17/18 6' (1.829 m)    Weight:   Wt Readings from Last 1 Encounters:  04/17/18 190 lb (86.2 kg)    Ideal Body Weight:  80.9 kg  BMI:  Body mass index is 25.77 kg/m.  Estimated Nutritional Needs:   Kcal:  1900-2076 calories (22-24 cal/kg)  Protein:  103-120 grams (1.2-1.4g/kg)  Fluid:  >2L  Matthew Ano. Kayliegh Boyers, MS, RD LDN Inpatient Clinical Dietitian Pager [phone]

## 2018-04-18 NOTE — Evaluation (Signed)
Physical Therapy Evaluation Patient Details Name: Matthew Meza MRN: [id number] DOB: June 22, 1941 Today's Date: 04/18/2018   History of Present Illness  Pt is a 77 y/o male admitted from SNF secondary to bloody emesis. Pt is s/p exploratory laparascopy on 5/14. PMH includes CVA with L sided weakness, HTN, DM, Dementia, and parkinsons.   Clinical Impression  Pt admitted secondary to problem above with deficits below. Unsure of patient's baseline function, as sister did not know and pt with cognitive deficits. Pt with L sided weakness at baseline secondary to CVA. RN requesting to do bed level assessment this session. Noted decreased ROM in LUE/LLE; see below. Required max A for repositioning in bed. Will continue to follow acutely to maximize functional mobility independence and safety.     Follow Up Recommendations SNF    Equipment Recommendations  None recommended by PT    Recommendations for Other Services       Precautions / Restrictions Precautions Precautions: Fall;Other (comment) Precaution Comments: NG tube  Restrictions Weight Bearing Restrictions: No      Mobility  Bed Mobility Overal bed mobility: Needs Assistance Bed Mobility: Rolling Rolling: Max assist         General bed mobility comments: RN requesting to perform bed level eval. Required max A for repositioning hips in bed. Able to assist some with RUE to readjust trunk in midline.   Transfers                    Ambulation/Gait                Stairs            Wheelchair Mobility    Modified Rankin (Stroke Patients Only)       Balance                                             Pertinent Vitals/Pain Pain Assessment: Faces Faces Pain Scale: Hurts a little bit Pain Location: stomach  Pain Descriptors / Indicators: Aching Pain Intervention(s): Limited activity within patient's tolerance;Monitored during session;Repositioned    Home Living  Family/patient expects to be discharged to:: Skilled nursing facility                      Prior Function Level of Independence: Needs assistance   Gait / Transfers Assistance Needed: Sister present during session, however, wasn't sure of previous mobility status and pt unable to assist with PLOF info given cognitive deficits. Sister reports she thinks he was using a WC.            Hand Dominance        Extremity/Trunk Assessment   Upper Extremity Assessment Upper Extremity Assessment: LUE deficits/detail LUE Deficits / Details: L sided weakness at baseline. Limited ROM into elbow flexion and finger extension.     Lower Extremity Assessment Lower Extremity Assessment: LLE deficits/detail LLE Deficits / Details: L sided weakness at baseline. Some tightness noted ankle PF's, however, able to get ankle to neutral.        Communication   Communication: Expressive difficulties;HOH  Cognition Arousal/Alertness: Awake/alert Behavior During Therapy: WFL for tasks assessed/performed Overall Cognitive Status: History of cognitive impairments - at baseline  General Comments General comments (skin integrity, edema, etc.): Pt's sister present throughout session.     Exercises General Exercises - Upper Extremity Shoulder Flexion: PROM;Left;5 reps Elbow Flexion: PROM;Left;10 reps Elbow Extension: PROM;Left;10 reps General Exercises - Lower Extremity Ankle Circles/Pumps: AROM;Right;10 reps Heel Slides: AROM;Right;PROM;Left;10 reps Other Exercises Other Exercises: Passive gastroc stretch in LLE X 2   Assessment/Plan    PT Assessment Patient needs continued PT services  PT Problem List Decreased strength;Decreased range of motion;Decreased balance;Decreased mobility;Decreased knowledge of use of DME;Decreased knowledge of precautions;Decreased cognition;Decreased safety awareness;Pain       PT Treatment  Interventions Functional mobility training;Therapeutic activities;Therapeutic exercise;Balance training;Patient/family education    PT Goals (Current goals can be found in the Care Plan section)  Acute Rehab PT Goals Patient Stated Goal: none stated  PT Goal Formulation: Patient unable to participate in goal setting Time For Goal Achievement: 05/02/18 Potential to Achieve Goals: Fair    Frequency Min 2X/week   Barriers to discharge        Co-evaluation               AM-PAC PT "6 Clicks" Daily Activity  Outcome Measure Difficulty turning over in bed (including adjusting bedclothes, sheets and blankets)?: Unable Difficulty moving from lying on back to sitting on the side of the bed? : Unable Difficulty sitting down on and standing up from a chair with arms (e.g., wheelchair, bedside commode, etc,.)?: Unable Help needed moving to and from a bed to chair (including a wheelchair)?: Total Help needed walking in hospital room?: Total Help needed climbing 3-5 steps with a railing? : Total 6 Click Score: 6    End of Session   Activity Tolerance: Patient tolerated treatment well Patient left: in bed;with call bell/phone within reach;with bed alarm set;with family/visitor present Nurse Communication: Mobility status PT Visit Diagnosis: Other abnormalities of gait and mobility (R26.89);Difficulty in walking, not elsewhere classified (R26.2);Hemiplegia and hemiparesis Hemiplegia - Right/Left: Left Hemiplegia - caused by: Cerebral infarction    Time: 1610-9604 PT Time Calculation (min) (ACUTE ONLY): 21 min   Charges:   PT Evaluation $PT Eval Moderate Complexity: 1 Mod     PT G Codes:        Gladys Damme, PT, DPT  Acute Rehabilitation Services  Pager: [phone]   Le[facility] Prom 04/18/2018, 4:40 PM

## 2018-04-18 NOTE — Progress Notes (Signed)
PROGRESS NOTE    Patient: Matthew Meza     PCP: Blair Heys, MD                    DOB: 1941/07/19            DOA: 04/17/2018 AOZ:[id number]             DOS: 04/18/2018, 3:30 PM   LOS: 1 day   Date of Service: The patient was seen and examined on 04/18/2018  Subjective:  Postop day #1  status post exploratory laparotomy for portal venous gas with partial malrotation of the small bowel. NG tube in place, patient is lethargic, but easily arousable, minimal verbal interaction, not complaining of any pain, reporting a sensation of voiding, but Foley cath in place urine is clear Per nursing staff patient has been stable no issues overnight.   ----------------------------------------------------------------------------------------------------------------------  Brief Narrative:   Matthew Meza is a 77 y.o. male with medical history significant of CVA with chronic left-sided deficits, Parkinson's disease, type 2 diabetes on insulin, dementia, hyperlipidemia who comes in with one episode of coffee-ground emesis and portal venous gas, from skilled nursing facility.  In ED patient was found tachycardic, tachypneic, hypertensive.  With WBC of 27.8, lactic acid was almost 5.  CT of abdomen pelvis reported portal venous gas but no acute process mildly elevated LFTs, Initiated on sepsis protocol of IV fluids and IV antibiotics, NGT was inserted. Neurosurgery was consulted, patient was urgently taken to OR.  Exploratory laparotomy on 04/17/2018. Cover to help portal venous gas with partial malrotation of the small bowel.    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 per above -IV Zosyn  H/o CVA with dense left-sided deficits: - Hold clopidogrel 75 mg daily -Continue baclofen 3 times daily  Parkinson's: Suspect this is most likely related to stroke. -Continue amantadine 200 mg twice daily  Type 2 diabetes: -Hold scheduled insulin with meals -Sliding scale insulin every 4 hours  Hyperlipidemia: -Continue atorvastatin 40 mg daily   Prophylaxis: Enoxaparin  Disposition: Pending return of bowel function  Full code   DVT prophylaxis:   SCDs/compression stockings    Lovenox [place]  Code Status:         Full code  Family Communication:  The above findings and plan of care has been discussed with patient he is mildly lethargic, questionable understanding.  Nursing staff at the bedside was informed of the plan.  . Disposition Plan:   > 3 days            Gerri Spore back to skilled nursing facility Consultants:  General surgery  Procedures:    Status post exploratory laparotomy on 04/17/2018  Antimicrobials:  Anti-infectives (From admission, onward)   Start     Dose/Rate Route Frequency Ordered Stop   04/17/18 1330  piperacillin-tazobactam (ZOSYN) IVPB 3.375 g     3.375 g 12.5 mL/hr over 240 Minutes Intravenous Every 8 hours 04/17/18 0815     04/17/18 1200  piperacillin-tazobactam (ZOSYN) IVPB 3.375 g  Status:  Discontinued      3.375 g 12.5 mL/hr over 240 Minutes Intravenous Every 8 hours 04/17/18 0534 04/17/18 0815   04/17/18 0815  piperacillin-tazobactam (ZOSYN) IVPB 3.375 g  Status:  Discontinued     3.375 g 100 mL/hr over 30 Minutes Intravenous Every 8 hours 04/17/18 0801 04/17/18 0814   04/17/18 0515  piperacillin-tazobactam (ZOSYN) IVPB 3.375 g     3.375 g 100 mL/hr over 30 Minutes Intravenous  Once 04/17/18 0505 04/17/18 0633       Objective: Vitals:   04/18/18 1116 04/18/18 1216 04/18/18 1316 04/18/18 1356  BP: 130/80 129/86 111/84   Pulse: (!) 114 (!) 110 (!) 111   Resp: Temp:    97.8 F (36.6 C)  TempSrc:      SpO2: 95% 100% 98%   Weight:      Height:        Intake/Output Summary (Last 24 hours) at 04/18/2018 1530 Last data filed at 04/18/2018 1420 Gross per 24 hour  Intake 4981.25 ml  Output 975 ml  Net 4006.25 ml   Filed Weights   04/17/18 0538  Weight: 86.2 kg (190 lb)    Examination:  General exam: Lethargic, able to open eyes to command HEENT: NG tube in place, O2 via nasal cannula, otherwise within normal limits Respiratory system: Clear to auscultation. Respiratory effort normal. Cardiovascular system: S1 & S2 heard, RRR. No JVD, murmurs, rubs, gallops or clicks. No pedal edema. Gastrointestinal system: Mildly distended, surgical wound clean nontender not draining, dressing in place, hypoactive bowel sounds, Central nervous system: Mild lethargic, able to be awakened with verbal command, cranial nerves seems to be within normal limits, chronic left-sided hemiplegia Extremities: Sided weakness Skin: No rashes,  Wounds: Periumbilical surgical wound, resting in place negative any erythema edema or drainage  Data Reviewed: I have personally reviewed following labs and imaging studies  CBC: Recent Labs  Lab 04/17/18 0423 04/17/18 2305 04/18/18 0213  WBC 27.8* 14.4* 13.7*  NEUTROABS 25.0*  --   --   HGB 19.2* 12.0* 12.2*  HCT 56.2* 37.1* 37.9*  MCV 95.7  96.1 95.0  PLT 241 159 148*   Basic Metabolic Panel: Recent Labs  Lab 04/17/18 0423 04/17/18 2305 04/18/18 0213  NA 141 143 141  K 4.4 3.8 4.0  CL 103 113* 111  CO2 GLUCOSE 166* 125* 142*  BUN CREATININE 1.05 0.95 0.84  CALCIUM 9.6 7.1* 7.7*   GFR: Estimated Creatinine Clearance: 82.1 mL/min (by C-G formula based on SCr of 0.84 mg/dL). Liver Function Tests: Recent Labs  Lab 04/17/18 0423 04/17/18 2305 04/18/18 0213  AST 46* 23 21  ALT 87* 36 36  ALKPHOS 147* 63 67  BILITOT 1.4* 0.9 1.2  PROT 7.5 4.6* 4.9*  ALBUMIN 4.2 2.8* 2.9*   Recent Labs  Lab 04/17/18 0423  LIPASE 25   No results for input(s): AMMONIA in the last 168 hours. Coagulation Profile: No results for input(s): INR, PROTIME in the last 168 hours. Cardiac Enzymes: No results  for input(s): CKTOTAL, CKMB, CKMBINDEX, TROPONINI in the last 168 hours. BNP (last 3 results) No results for input(s): PROBNP in the last 8760 hours. HbA1C: No results for input(s): HGBA1C in the last 72 hours. CBG: Recent Labs  Lab 04/17/18 1654 04/17/18 2115 04/18/18 0425 04/18/18 0739 04/18/18 1203  GLUCAP 168* 141* 130* 108* 92   Lipid Profile: No results for input(s): CHOL, HDL, LDLCALC, TRIG, CHOLHDL, LDLDIRECT in the last 72 hours. Thyroid Function Tests: No results for input(s): TSH, T4TOTAL, FREET4, T3FREE, THYROIDAB in the last 72 hours. Anemia Panel: No results for input(s): VITAMINB12, FOLATE, FERRITIN, TIBC, IRON, RETICCTPCT in the last 72 hours. Sepsis Labs: Recent Labs  Lab 04/17/18 2122 04/18/18 0213 04/18/18 0905 04/18/18 1153  LATICACIDVEN 5.8* 2.3* 1.6 1.1    Recent Results (from the past 240 hour(s))  Blood Culture (routine x 2)     Status: None (Preliminary result)   Collection Time: 04/17/18  4:25 AM  Result Value Ref Range Status   Specimen Description BLOOD RIGHT WRIST  Final   Special Requests   Final    BOTTLES DRAWN AEROBIC AND ANAEROBIC Blood Culture adequate  volume   Culture   Final    NO GROWTH 1 DAY Performed at [facility], [address] [place], [place] [address]    Report Status PENDING  Incomplete  Blood Culture (routine x 2)     Status: None (Preliminary result)   Collection Time: 04/17/18  5:39 AM  Result Value Ref Range Status   Specimen Description BLOOD RIGHT HAND  Final   Special Requests   Final    BOTTLES DRAWN AEROBIC AND ANAEROBIC Blood Culture adequate volume   Culture   Final    NO GROWTH 1 DAY Performed at [facility], [address] [place], [place] [address]    Report Status PENDING  Incomplete      Radiology Studies: Dg Abdomen 1 View  Result Date: 04/17/2018 CLINICAL DATA:  Nasogastric tube placement EXAM: ABDOMEN - 1 VIEW COMPARISON:  CT abdomen and pelvis Apr 17, 2018 FINDINGS: Nasogastric tube tip and side port are in the stomach. There is mild gastric dilatation with air as well as dilatation of multiple loops of small bowel, similar to CT performed earlier in the day. No free air. Lung bases clear. Portal venous air seen on recent CT is not appreciable by radiography. IMPRESSION: Nasogastric tube tip and side port in stomach. Dilated bowel remains. A degree of bowel obstruction must be of concern. No free air evident. Lung bases clear. Electronically Signed   By: Bretta Bang III M.D.   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 Cecum is in normal position in the right lower quadrant. Swirling pattern of small bowel mesentery is seen in the right abdomen, consistent with volvulus and partial small bowel obstruction. Mild wall thickening of dilated small bowel loops is seen as well as possible mild pneumatosis (in addition to portal venous gas), suspicious for small bowel ischemia. These findings were  discussed with Dr. Donell Beers at the time of this addendum on 04/17/2018 at 1215 hours. Electronically Signed   By: Myles Rosenthal M.D.   On: 04/17/2018 12:22   Result Date: 04/17/2018 CLINICAL DATA:  Acute onset of generalized abdominal pain, diarrhea and hematemesis. EXAM: CT ABDOMEN  AND PELVIS WITH CONTRAST TECHNIQUE: Multidetector CT imaging of the abdomen and pelvis was performed using the standard protocol following bolus administration of intravenous contrast. CONTRAST:  10rta and its branches. The abdominal aorta is otherwise grossly unremarkable. The inferior vena cava is grossly unremarkable. No retroperitoneal lymphadenopathy is seen. No pelvic sidewall lymphadenopathy is identified. Reproductive: The bladder is mildly distended and grossly unremarkable. The prostate is enlarged, measuring 5.9 cm in transverse dimension, with heterogeneity. Other: No additional soft tissue abnormalities are seen. Musculoskeletal: No acute osseous abnormalities are identified. The visualized musculature is unremarkable in appearance. IMPRESSION: 1. Portal venous gas within the liver, of uncertain etiology. No evidence of pneumatosis or bowel ischemia on provided images. 2. Distention of small-bowel loops to 4.0 cm in maximal diameter, with gradual decompression at the right lower quadrant. This may simply reflect some degree of small bowel dysmotility; no evidence of bowel obstruction. 3. Slight wall thickening along the mid ileum could reflect a mild infectious or inflammatory process. 4. Enlarged prostate.  Would correlate with PSA. 5. 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 Aortic Atherosclerosis (ICD10-I70.0). These results were called by telephone at the time of interpretation on 04/17/2018 at 6:42 am to Dr. Ross Marcus , who verbally acknowledged these results. Electronically Signed: By: Roanna Raider M.D. On: 04/17/2018 06:46   Dg Chest Port 1 View  Result Date: 04/17/2018 CLINICAL DATA:   Central venous catheter EXAM: PORTABLE CHEST 1 VIEW COMPARISON:  04/17/2018, 06/03/2017 FINDINGS: Left-sided central venous catheter tip overlies the brachiocephalic confluence. No pneumothorax. Low lung volumes. Atelectasis versus minimal infiltrate at the right base. Probable tiny effusions. Cardiomediastinal silhouette within normal limits. No pneumothorax. Esophageal tube tip below the diaphragm but not included on the image. IMPRESSION: 1. Left-sided central venous catheter tip overlies the brachiocephalic confluence. No pneumothorax. 2. Low lung volumes with probable tiny effusions. Atelectasis versus minimal infiltrate at the right base Electronically Signed   By: Jasmine Pang M.D.   On: 04/17/2018 20:02   Dg Abdomen Acute W/chest  Result Date: 04/17/2018 CLINICAL DATA:  Acute onset of vomiting. EXAM: DG ABDOMEN ACUTE W/ 1V CHEST COMPARISON:  Chest radiograph performed 06/03/2017, and lumbar spine radiographs performed 06/02/2013 FINDINGS: The lungs are hypoexpanded but appear grossly clear. There is no evidence of focal opacification, pleural effusion or pneumothorax. The cardiomediastinal silhouette is within normal limits. There is distention of small-bowel loops to 4.5 cm in maximal diameter, concerning for some degree of small bowel obstruction. The colon is not well assessed. The stomach is largely filled with air and fluid. No free intra-abdominal air is identified on the provided upright view. No acute osseous abnormalities are seen; the sacroiliac joints are unremarkable in appearance. IMPRESSION: 1. Dilatation of small-bowel loops to 4.5 cm in maximal diameter, concerning for some degree of small bowel obstruction. No free intra-abdominal air seen. 2. Lungs hypoexpanded but grossly clear. These results were called by telephone at the time of interpretation on 04/17/2018 at 6:20 am to Dr. Ross Marcus, who verbally acknowledged these results. Electronically Signed   By: Roanna Raider M.D.    On: 04/17/2018 06:21   US Abdomen Limited Ruq  Result Date: 04/17/2018 CLINICAL DATA:  77 year old male with abnormal LFTs. Portal venous gas suspected within the liver on CT today. EXAM: ULTRASOUND ABDOMEN LIMITED RIGHT UPPER QUADRANT COMPARISON:  CT Abdomen and Pelvis 0618 hours today. FINDINGS: Gallbladder: No gallstones or wall thickening visualized. No sonographic Murphy sign noted by sonographer. Common bile duct: Diameter: 3 millimeters, within  normal limits (images 11 and 12). Liver: Heterogeneous echotexture with shadowing echogenic foci in the anterior left hepatic lobe likely corresponding to the abnormal gas seen by CT. Background liver echogenicity may be abnormally decreased, uncertain. Portal vein is patent on color Doppler imaging with normal direction of blood flow towards the liver. Other findings: No free fluid identified. Grossly negative visible right kidney. IMPRESSION: 1. The gallbladder and CBD appear normal by ultrasound. 2. Abnormal heterogeneous appearance of the liver in part related to the parenchymal portal venous gas seen by CT earlier today. Possible but uncertain underlying hepatic edema, such as due to a hepatitis. Electronically Signed   By: Odessa Fleming M.D.   On: 04/17/2018 09:42    Scheduled Meds: . amantadine  200 mg Per Tube BID  . enoxaparin (LOVENOX) injection  40 mg Subcutaneous Daily  . insulin aspart  0-9 Units Subcutaneous TID WC  . sodium chloride flush  10-40 mL Intracatheter Q12H   Continuous Infusions: . sodium chloride 75 mL/hr at 04/18/18 1310  . sodium chloride    . famotidine (PEPCID) IV Stopped (04/18/18 1059)  . lactated ringers    . piperacillin-tazobactam (ZOSYN)  IV 3.375 g (04/18/18 1308)  . sodium chloride      Time spent: >35 minutes  Kendell Bane, MD [facility],  Pager [phone]  If 7PM-7AM, please contact night-coverage [email or web address]   Password [email or web address]  04/18/2018, 3:30 PM

## 2018-04-18 NOTE — Progress Notes (Signed)
At approximately 2135, this nurse found the patient resting comfortably, in no distress, having removed his NG tube.  Notified [facility] provider K. Schorr.  NG tube was replaced and placement confirmed by Xray.  Will continue to monitor.

## 2018-04-19 DIAGNOSIS — F0280 Dementia in other diseases classified elsewhere without behavioral disturbance: Secondary | ICD-10-CM

## 2018-04-19 DIAGNOSIS — G43A0 Cyclical vomiting, not intractable: Secondary | ICD-10-CM

## 2018-04-19 DIAGNOSIS — G20 Parkinson's disease: Secondary | ICD-10-CM

## 2018-04-19 LAB — COMPREHENSIVE METABOLIC PANEL
ALBUMIN: 2.7 g/dL — AB (ref 3.5–5.0)
ALT: 26 U/L (ref 17–63)
AST: 18 U/L (ref 15–41)
Alkaline Phosphatase: 61 U/L (ref 38–126)
Anion gap: 8 (ref 5–15)
BUN: 7 mg/dL (ref 6–20)
CHLORIDE: 107 mmol/L (ref 101–111)
CO2: 27 mmol/L (ref 22–32)
CREATININE: 0.79 mg/dL (ref 0.61–1.24)
Calcium: 8.1 mg/dL — ABNORMAL LOW (ref 8.9–10.3)
GFR calc non Af Amer: >60 mL/min (ref >60)
GLUCOSE: 94 mg/dL (ref 65–99)
Potassium: 3.2 mmol/L — ABNORMAL LOW (ref 3.5–5.1)
SODIUM: 142 mmol/L (ref 135–145)
Total Bilirubin: 1.5 mg/dL — ABNORMAL HIGH (ref 0.3–1.2)
Total Protein: 5 g/dL — ABNORMAL LOW (ref 6.5–8.1)

## 2018-04-19 LAB — CBC
HCT: 35.3 % — ABNORMAL LOW (ref 39.0–52.0)
Hemoglobin: 11.5 g/dL — ABNORMAL LOW (ref 13.0–17.0)
MCH: 30.7 pg (ref 26.0–34.0)
MCHC: 32.6 g/dL (ref 30.0–36.0)
MCV: 94.1 fL (ref 78.0–100.0)
Platelets: 135 10*3/uL — ABNORMAL LOW (ref 150–400)
RBC: 3.75 MIL/uL — AB (ref 4.22–5.81)
RDW: 13.6 % (ref 11.5–15.5)
WBC: 10.7 10*3/uL — ABNORMAL HIGH (ref 4.0–10.5)

## 2018-04-19 LAB — GLUCOSE, CAPILLARY
Glucose-Capillary: 102 mg/dL — ABNORMAL HIGH (ref 65–99)
Glucose-Capillary: 102 mg/dL — ABNORMAL HIGH (ref 65–99)
Glucose-Capillary: 104 mg/dL — ABNORMAL HIGH (ref 65–99)
Glucose-Capillary: 93 mg/dL (ref 65–99)
Glucose-Capillary: 95 mg/dL (ref 65–99)
Glucose-Capillary: 97 mg/dL (ref 65–99)

## 2018-04-19 MED ORDER — CHLORHEXIDINE GLUCONATE 0.12 % MT SOLN
15.0000 mL | Freq: Two times a day (BID) | OROMUCOSAL | Status: DC
  Administered 2018-04-19 – 2018-05-01 (×25): 15 mL via OROMUCOSAL
  Filled 2018-04-19 (×25): qty 15

## 2018-04-19 MED ORDER — ORAL CARE MOUTH RINSE
15.0000 mL | Freq: Two times a day (BID) | OROMUCOSAL | Status: DC
  Administered 2018-04-19 – 2018-04-30 (×20): 15 mL via OROMUCOSAL

## 2018-04-19 MED ORDER — KCL IN DEXTROSE-NACL 20-5-0.45 MEQ/L-%-% IV SOLN
INTRAVENOUS | Status: AC
  Administered 2018-04-19 – 2018-04-23 (×8): via INTRAVENOUS
  Filled 2018-04-19 (×12): qty 1000

## 2018-04-19 NOTE — NC FL2 (Addendum)
[place] MEDICAID FL2 LEVEL OF CARE SCREENING TOOL     IDENTIFICATION  Patient Name: Matthew Meza Birthdate: 1941/11/09 Sex: male Admission Date (Current Location): 04/17/2018  [facility] and [place] Number:  Producer, television/film/video and Address:  [facility] [place], [address] [place], [place] [address]      Provider Number: [id number]  Attending Physician Name and Address:  Kendell Bane, MD  Relative Name and Phone Number:  Valentino Saxon, daughter, [phone]    Current Level of Care: Hospital Recommended Level of Care: Skilled Nursing Facility Prior Approval Number:    Date Approved/Denied:   PASRR Number: [id number]  Discharge Plan: SNF    Current Diagnoses: Patient Active Problem List   Diagnosis Date Noted  . Dementia due to Parkinson's disease without behavioral disturbance (HCC) 04/17/2018  . Type 2 diabetes mellitus (HCC) 04/17/2018  . Nausea and vomiting 04/17/2018  . N&V (nausea and vomiting) 04/17/2018  . 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 Sepsis (HCC) 06/03/2017  . 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 Hyperlipidemia 04/12/2017  . Urinary tract infection 04/12/2017  . Obesity 04/12/2017  . Family history of stroke 04/12/2017  . Lethargy 04/12/2017  . Pressure injury of skin 04/10/2017  . Dysarthria, post-stroke   . Dysphagia, post-stroke   . Tachypnea   . Prediabetes   . Hypophosphatemia   . Stage 2 chronic kidney disease   . 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 Electrolyte imbalance 04/01/2017  . 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 Essential hypertension   . Acute encephalopathy   . Seasonal allergic rhinitis 10/30/2012    Orientation RESPIRATION BLADDER Height & Weight     Self  O2(Nasal cannula 1L) Incontinent,  Indwelling catheter Weight: 78.6 kg (173 lb 4.5 oz) Height:  6' (182.9 cm)  BEHAVIORAL SYMPTOMS/MOOD NEUROLOGICAL BOWEL NUTRITION STATUS      Continent Diet(Please see DC Summary)  AMBULATORY STATUS COMMUNICATION OF NEEDS Skin   Extensive Assist Verbally Surgical wounds(Closed incision on abdomen)                       Personal Care Assistance Level of Assistance  Bathing, Feeding, Dressing Bathing Assistance: Maximum assistance Feeding assistance: Limited assistance Dressing Assistance: Maximum assistance     Functional Limitations Info             SPECIAL CARE FACTORS FREQUENCY  PT (By licensed PT)     PT Frequency: 2x/week              Contractures      Additional Factors Info  Code Status, Allergies, Insulin Sliding Scale Code Status Info: Full Allergies Info: NKA   Insulin Sliding Scale Info: 3x daily with meals       Current Medications (04/19/2018):  This is the current hospital active medication list Current Facility-Administered Medications  Medication Dose Route Frequency Provider Last Rate Last Dose  . 0.9 %  sodium chloride infusion   Intravenous Once Barnetta Chapel, PA-C      . amantadine (SYMMETREL) 50 MG/5ML solution 200 mg  200 mg Per Tube BID Barnetta Chapel, PA-C   200 mg at 04/18/18 2326  . chlorhexidine (PERIDEX) 0.12 % solution 15 mL  15 mL Mouth Rinse BID Kendell Bane, MD  15 mL at 04/19/18 0859  . dextrose 5 % and 0.45 % NaCl with KCl 20 mEq/L infusion   Intravenous Continuous Nevin Bloodgood A, MD 100 mL/hr at 04/19/18 0903    . enoxaparin (LOVENOX) injection 40 mg  40 mg Subcutaneous Daily Barnetta Chapel, PA-C   40 mg at 04/19/18 0859  . famotidine (PEPCID) IVPB 20 mg premix  20 mg Intravenous Q12H Barnetta Chapel, PA-C   Stopped at 04/19/18 1200  . insulin aspart (novoLOG) injection 0-9 Units  0-9 Units Subcutaneous TID WC Barnetta Chapel, PA-C   2 Units at 04/17/18 1258  . lactated ringers infusion   Intravenous Continuous  Barnetta Chapel, PA-C   Stopped at 04/19/18 0831  . MEDLINE mouth rinse  15 mL Mouth Rinse q12n4p Shahmehdi, Seyed A, MD      . morphine 2 MG/ML injection 1-2 mg  1-2 mg Intravenous Q3H PRN Barnetta Chapel, PA-C   2 mg at 04/18/18 1041  . ondansetron (ZOFRAN) tablet 4 mg  4 mg Oral Q6H PRN Barnetta Chapel, PA-C       Or  . ondansetron [facility]) injection 4 mg  4 mg Intravenous Q6H PRN Barnetta Chapel, PA-C      . piperacillin-tazobactam (ZOSYN) IVPB 3.375 g  3.375 g Intravenous Q8H Barnetta Chapel, PA-C   Stopped at 04/19/18 1035  . sodium chloride 0.9 % bolus 1,000 mL  1,000 mL Intravenous Once Barnetta Chapel, PA-C      . sodium chloride flush (NS) 0.9 % injection 10-40 mL  10-40 mL Intracatheter Q12H Purohit, Shrey C, MD   10 mL at 04/18/18 2143  . sodium chloride flush (NS) 0.9 % injection 10-40 mL  10-40 mL Intracatheter PRN Purohit, Salli Quarry, MD   20 mL at 04/19/18 0442     Discharge Medications: Please see discharge summary for a list of discharge medications.  Relevant Imaging Results:  Relevant Lab Results:   Additional Information SS#: [id number]   Hospice to follow at SNF.   Mearl Latin, LCSWA

## 2018-04-19 NOTE — Progress Notes (Signed)
PROGRESS NOTE    Patient: Matthew Meza     PCP: Blair Heys, MD                    DOB: [birth year]            DOA: 04/17/2018 ZOX:[id number]             DOS: 04/19/2018, 12:42 PM   LOS: 2 days   Date of Service: The patient was seen and examined on 04/19/2018  Subjective:  Postop day #1  status post exploratory laparotomy for portal venous gas with partial malrotation of the small bowel. Apparently patient has pulled out his NG tube overnight.  Foley catheter still in place. He is awake alert but confused. Per nursing staff no other issues overnight.  Except for mild agitation  ----------------------------------------------------------------------------------------------------------------------  Brief Narrative:   Matthew Meza is a 77 y.o. male with medical history significant of CVA with chronic left-sided deficits, Parkinson's disease, type 2 diabetes on insulin, dementia, hyperlipidemia who comes in with one episode of coffee-ground emesis and portal venous gas, from skilled nursing facility.  In ED patient was found tachycardic, tachypneic, hypertensive.  With WBC of 27.8, lactic acid was almost 5.  CT of abdomen pelvis reported portal venous gas but no acute process mildly elevated LFTs, Initiated on sepsis protocol of IV fluids and IV antibiotics, NGT was inserted. Neurosurgery was consulted, patient was urgently taken to OR.  Exploratory laparotomy on 04/17/2018. Cover to help portal venous gas with partial malrotation of the small bowel.    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 -Surgery following  Sepsis with nausea, vomiting/portal venous gas: -Nausea vomiting has improved, he is spontaneously has removed his NG tube. -Per nursing staff he has not had any emesis overnight. -we will Continue supportive therapy, IV antibiotics, -Blood pressure stable, afebrile, WBCs of 13.7 -->10.7 -Lactic acid 5.9 -->2.3----->1.1  -IV famotidine -We will change his IV fluid to D5 half-normal saline with potassium. -Mild hypokalemia will replete accordingly.   Elevated LFTs:  -Mild, monitoring LFTs -Right upper quadrant ultrasound -IV Zosyn  H/o CVA with dense left-sided deficits: - Hold clopidogrel 75 mg daily -Continue baclofen 3 times daily  Parkinson's: Suspect this is most likely related to stroke. -Continue amantadine 200 mg twice daily  Type 2 diabetes: -Hold scheduled insulin with meals -Sliding scale insulin every 4 hours  Hyperlipidemia: -Continue atorvastatin 40 mg daily   Prophylaxis: Enoxaparin  Disposition: Pending return of bowel function  Full code   DVT prophylaxis:   SCDs/compression stockings    Lovenox   Code Status:         Full code  Family Communication:  The above findings and plan of care has been discussed with patient he is mildly lethargic, questionable understanding.  Nursing staff at the bedside was informed of the plan.  . Disposition Plan:   >  3 days            Gerri Spore back to skilled nursing facility Consultants:   General surgery  Procedures:    Status post exploratory laparotomy on 04/17/2018  Antimicrobials:  Anti-infectives (From admission, onward)   Start     Dose/Rate Route Frequency Ordered Stop   04/17/18 1330  piperacillin-tazobactam (ZOSYN) IVPB 3.375 g     3.375 g 12.5 mL/hr  over 240 Minutes Intravenous Every 8 hours 04/17/18 0815     04/17/18 1200  piperacillin-tazobactam (ZOSYN) IVPB 3.375 g  Status:  Discontinued     3.375 g 12.5 mL/hr over 240 Minutes Intravenous Every 8 hours 04/17/18 0534 04/17/18 0815   04/17/18 0815  piperacillin-tazobactam (ZOSYN) IVPB 3.375 g  Status:  Discontinued     3.375 g 100 mL/hr over 30 Minutes Intravenous Every 8 hours 04/17/18 0801 04/17/18 0814   04/17/18 0515  piperacillin-tazobactam (ZOSYN) IVPB 3.375 g     3.375 g 100 mL/hr over 30 Minutes Intravenous  Once 04/17/18 0505 04/17/18 0633       Objective: Intake/Output Summary (Last 24 hours) at 04/19/2018 1242 Last data filed at 04/19/2018 1036 Gross per 24 hour  Intake 2645.75 ml  Output 2400 ml  Net 245.75 ml   Filed Weights   04/17/18 0538 04/19/18 0600  Weight: 86.2 kg (190 lb) 78.6 kg (173 lb 4.5 oz)   BP 105/81   Pulse (!) 104   Temp 98.7 F (37.1 C) (Oral)   Resp 17   Ht 6' (1.829 m)   Wt 78.6 kg (173 lb 4.5 oz)   SpO2 98%   BMI 23.50 kg/m    Physical Exam  Constitution:  Alert, cooperative, no distress,  Psychiatric: Currently confused, but awake, alert HEENT: Normocephalic, PERRL, otherwise with in Normal limits  Cardio vascular:  S1/S2, RRR, No murmure, No Rubs or Gallops  Chest/pulmonary: Clear to auscultation bilaterally, respirations unlabored  Chest symmetric Abdomen: Soft, hypoactive bowel sounds, periumbilical surgical wound with dressing Muscular skeletal: Limited exam patient is in bed, left hemiplegia Neuro: CNII-XII intact. ,  History of CVA, left sided hemiplegia, no changes, confused, Extremities: No pitting edema lower extremities, +2 pulses  Skin: Dry, warm to touch, negative for any Rashes, No open wounds  Data Reviewed: I have personally reviewed following labs and imaging studies  CBC: Recent Labs  Lab 04/17/18 0423 04/17/18 2305 04/18/18 0213 04/19/18 0438  WBC 27.8* 14.4* 13.7* 10.7*  NEUTROABS 25.0*  --   --   --    HGB 19.2* 12.0* 12.2* 11.5*  HCT 56.2* 37.1* 37.9* 35.3*  MCV 95.7 96.1 95.0 94.1  PLT 241 159 148* 135*   Basic Metabolic Panel: Recent Labs  Lab 04/17/18 0423 04/17/18 2305 04/18/18 0213 04/19/18 0438  NA 141 143 141 142  K 4.4 3.8 4.0 3.2*  CL 103 113* 111 107  CO2 GLUCOSE 166* 125* 142* 94  BUN CREATININE 1.05 0.95 0.84 0.79  CALCIUM 9.6 7.1* 7.7* 8.1*   GFR: Estimated Creatinine Clearance: 86.2 mL/min (by C-G formula based on SCr of 0.79 mg/dL). Liver Function Tests: Recent Labs  Lab 04/17/18 0423 04/17/18 2305 04/18/18 0213 04/19/18 0438  AST 46* ALT 87* 36 36 26  ALKPHOS 147* 63 67 61  BILITOT 1.4* 0.9 1.2 1.5*  PROT 7.5 4.6* 4.9* 5.0*  ALBUMIN 4.2 2.8* 2.9* 2.7*   Recent Labs  Lab 04/17/18 0423  LIPASE 25  No results for input(s): AMMONIA in the last 168 hours. Coagulation Profile: No results for input(s): INR, PROTIME in the last 168 hours. Cardiac Enzymes: No results for input(s): CKTOTAL, CKMB, CKMBINDEX, TROPONINI in the last 168 hours. BNP (last 3 results) No results for input(s): PROBNP in the last 8760 hours. HbA1C: No results for input(s): HGBA1C in the last 72 hours. CBG: Recent Labs  Lab 04/18/18 2055 04/19/18 0013 04/19/18 0430 04/19/18 0748 04/19/18 1107  GLUCAP 114* 95 93 97 102*   LSepsis Labs: Recent Labs  Lab 04/17/18 2122 04/18/18 0213 04/18/18 0905 04/18/18 1153  LATICACIDVEN 5.8* 2.3* 1.6 1.1    Recent Results (from the past 240 hour(s))  Blood Culture (routine x 2)     Status: None (Preliminary result)   Collection Time: 04/17/18  4:25 AM  Result Value Ref Range Status   Specimen Description BLOOD RIGHT WRIST  Final   Special Requests   Final    BOTTLES DRAWN AEROBIC AND ANAEROBIC Blood Culture adequate volume   Culture   Final    NO GROWTH 2 DAYS Performed at [facility], [address] [place], [place] [address]    Report Status PENDING  Incomplete  Blood  Culture (routine x 2)     Status: None (Preliminary result)   Collection Time: 04/17/18  5:39 AM  Result Value Ref Range Status   Specimen Description BLOOD RIGHT HAND  Final   Special Requests   Final    BOTTLES DRAWN AEROBIC AND ANAEROBIC Blood Culture adequate volume   Culture   Final    NO GROWTH 2 DAYS Performed at [facility], [address], [place], [place] [address]    Report Status PENDING  Incomplete  MRSA PCR Screening     Status: None   Collection Time: 04/18/18  2:05 PM  Result Value Ref Range Status   MRSA by PCR NEGATIVE NEGATIVE Final    Comment:        The GeneXpert MRSA Assay (FDA approved for NASAL specimens only), is one component of a comprehensive MRSA colonization surveillance program. It is not intended to diagnose MRSA infection nor to guide or monitor treatment for MRSA infections. Performed at [facility], [address] [place], [place] [address]       Radiology Studies: Dg Abdomen 1 View  Result Date: 04/17/2018 CLINICAL DATA:  Nasogastric tube placement EXAM: ABDOMEN - 1 VIEW COMPARISON:  CT abdomen and pelvis Apr 17, 2018 FINDINGS: Nasogastric tube tip and side port are in the stomach. There is mild gastric dilatation with air as well as dilatation of multiple loops of small bowel, similar to CT performed earlier in the day. No free air. Lung bases clear. Portal venous air seen on recent CT is not appreciable by radiography. IMPRESSION: Nasogastric tube tip and side port in stomach. Dilated bowel remains. A degree of bowel obstruction must be of concern. No free air evident. Lung bases clear. Electronically Signed   By: Bretta Bang III M.D.   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 Cecum is in normal position in the right lower  quadrant. Swirling pattern of small bowel mesentery is seen in the right abdomen, consistent with volvulus and partial small bowel obstruction. Mild wall thickening of dilated small bowel loops is  seen as well as possible mild pneumatosis (in addition to portal venous gas), suspicious for small bowel ischemia. These findings were discussed with Dr. Donell Beers at the time of this addendum on 04/17/2018 at 1215 hours. Electronically Signed   By: Myles Rosenthal M.D.   On: 04/17/2018 12:22   Result Date: 04/17/2018 CLINICAL DATA:  Acute onset of generalized abdominal pain, diarrhea and hematemesis. EXAM: CT ABDOMEN AND PELVIS WITH CONTRAST TECHNIQUE: Multidetector CT imaging of the abdomen and pelvis was performed using the standard protocol following bolus administration of intravenous contrast. CONTRAST:  10rta and its branches. The abdominal aorta is otherwise grossly unremarkable. The inferior vena cava is grossly unremarkable. No retroperitoneal lymphadenopathy is seen. No pelvic sidewall lymphadenopathy is identified. Reproductive: The bladder is mildly distended and grossly unremarkable. The prostate is enlarged, measuring 5.9 cm in transverse dimension, with heterogeneity. Other: No additional soft tissue abnormalities are seen. Musculoskeletal: No acute osseous abnormalities are identified. The visualized musculature is unremarkable in appearance. IMPRESSION: 1. Portal venous gas within the liver, of uncertain etiology. No evidence of pneumatosis or bowel ischemia on provided images. 2. Distention of small-bowel loops to 4.0 cm in maximal diameter, with gradual decompression at the right lower quadrant. This may simply reflect some degree of small bowel dysmotility; no evidence of bowel obstruction. 3. Slight wall thickening along the mid ileum could reflect a mild infectious or inflammatory process. 4. Enlarged prostate.  Would correlate with PSA. 5. 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 Aortic  Atherosclerosis (ICD10-I70.0). These results were called by telephone at the time of interpretation on 04/17/2018 at 6:42 am to Dr. Ross Marcus , who verbally acknowledged these results. Electronically Signed: By: Roanna Raider M.D. On: 04/17/2018 06:46   Dg Chest Port 1 View  Result Date: 04/17/2018 CLINICAL DATA:  Central venous catheter EXAM: PORTABLE CHEST 1 VIEW COMPARISON:  04/17/2018, 06/03/2017 FINDINGS: Left-sided central venous catheter tip overlies the brachiocephalic confluence. No pneumothorax. Low lung volumes. Atelectasis versus minimal infiltrate at the right base. Probable tiny effusions. Cardiomediastinal silhouette within normal limits. No pneumothorax. Esophageal tube tip below the diaphragm but not included on the image. IMPRESSION: 1. Left-sided central venous catheter tip overlies the brachiocephalic confluence. No pneumothorax. 2. Low lung volumes with probable tiny effusions. Atelectasis versus minimal infiltrate at the right base Electronically Signed   By: Jasmine Pang M.D.   On: 04/17/2018 20:02   Dg Abdomen Acute W/chest  Result Date: 04/17/2018 CLINICAL DATA:  Acute onset of vomiting. EXAM: DG ABDOMEN ACUTE W/ 1V CHEST COMPARISON:  Chest radiograph performed 06/03/2017, and lumbar spine radiographs performed 06/02/2013 FINDINGS: The lungs are hypoexpanded but appear grossly clear. There is no evidence of focal opacification, pleural effusion or pneumothorax. The cardiomediastinal silhouette is within normal limits. There is distention of small-bowel loops to 4.5 cm in maximal diameter, concerning for some degree of small bowel obstruction. The colon is not well assessed. The stomach is largely filled with air and fluid. No free intra-abdominal air is identified on the provided upright view. No acute osseous abnormalities are seen; the sacroiliac joints are unremarkable in appearance. IMPRESSION: 1. Dilatation of small-bowel loops to 4.5 cm in maximal diameter, concerning  for some degree of small bowel obstruction. No free intra-abdominal air seen. 2. Lungs hypoexpanded but grossly clear. These results were called by telephone at the time of interpretation on 04/17/2018 at 6:20 am to Dr. Ross Marcus, who verbally acknowledged these results. Electronically Signed   By: Beryle Beams.D.  On: 04/17/2018 06:21   US Abdomen Limited Ruq  Result Date: 04/17/2018 CLINICAL DATA:  77 year old male with abnormal LFTs. Portal venous gas suspected within the liver on CT today. EXAM: ULTRASOUND ABDOMEN LIMITED RIGHT UPPER QUADRANT COMPARISON:  CT Abdomen and Pelvis 0618 hours today. FINDINGS: Gallbladder: No gallstones or wall thickening visualized. No sonographic Murphy sign noted by sonographer. Common bile duct: Diameter: 3 millimeters, within normal limits (images 11 and 12). Liver: Heterogeneous echotexture with shadowing echogenic foci in the anterior left hepatic lobe likely corresponding to the abnormal gas seen by CT. Background liver echogenicity may be abnormally decreased, uncertain. Portal vein is patent on color Doppler imaging with normal direction of blood flow towards the liver. Other findings: No free fluid identified. Grossly negative visible right kidney. IMPRESSION: 1. The gallbladder and CBD appear normal by ultrasound. 2. Abnormal heterogeneous appearance of the liver in part related to the parenchymal portal venous gas seen by CT earlier today. Possible but uncertain underlying hepatic edema, such as due to a hepatitis. Electronically Signed   By: Odessa Fleming M.D.   On: 04/17/2018 09:42    Scheduled Meds: . amantadine  200 mg Per Tube BID  . chlorhexidine  15 mL Mouth Rinse BID  . enoxaparin (LOVENOX) injection  40 mg Subcutaneous Daily  . insulin aspart  0-9 Units Subcutaneous TID WC  . mouth rinse  15 mL Mouth Rinse q12n4p  . sodium chloride flush  10-40 mL Intracatheter Q12H   Continuous Infusions: . sodium chloride    . dextrose 5 % and 0.45 % NaCl  with KCl 20 mEq/L 100 mL/hr at 04/19/18 0903  . famotidine (PEPCID) IV Stopped (04/19/18 1200)  . lactated ringers Stopped (04/19/18 0831)  . piperacillin-tazobactam (ZOSYN)  IV Stopped (04/19/18 1035)  . sodium chloride      Time spent: >35 minutes  Kendell Bane, MD [facility],  Pager [phone]  If 7PM-7AM, please contact night-coverage [email or web address]   Password [facility]  04/19/2018, 12:42 PM

## 2018-04-19 NOTE — Progress Notes (Signed)
2 Days Post-Op   Subjective/Chief Complaint: Awake, confused Pulled NG out   Objective: Vital signs in last 24 hours: Temp:  [97.6 F (36.4 C)-99.2 F (37.3 C)] 98.1 F (36.7 C) (05/16 0431) Pulse Rate:  [106-125] 109 (05/15 2359) Resp:  [14-19] 19 (05/16 0405) BP: (111-147)/(72-87) 138/82 (05/16 0405) SpO2:  [95 %-100 %] 99 % (05/15 2359) Weight:  [78.6 kg (173 lb 4.5 oz)] 78.6 kg (173 lb 4.5 oz) (05/16 0600) Last BM Date: (PTA)  Intake/Output from previous day: 05/15 0701 - 05/16 0700 In: 2645.8 [I.V.:2395.8; IV Piggyback:250] Out: 1250 [Urine:1150; Emesis/NG output:100] Intake/Output this shift: Total I/O In: -  Out: 400 [Emesis/NG output:400]  Exam: Awake, follows commands, looks comfortable, confused Lungs clear Abdomen a little full, incision clean  Lab Results:  Recent Labs    04/18/18 0213 04/19/18 0438  WBC 13.7* 10.7*  HGB 12.2* 11.5*  HCT 37.9* 35.3*  PLT 148* 135*   BMET Recent Labs    04/18/18 0213 04/19/18 0438  NA 141 142  K 4.0 3.2*  CL 111 107  CO2 24 27  GLUCOSE 142* 94  BUN 10 7  CREATININE 0.84 0.79  CALCIUM 7.7* 8.1*   PT/INR No results for input(s): LABPROT, INR in the last 72 hours. ABG No results for input(s): PHART, HCO3 in the last 72 hours.  Invalid input(s): PCO2, PO2  Studies/Results: Dg Abdomen 1 View  Result Date: 04/17/2018 CLINICAL DATA:  Nasogastric tube placement EXAM: ABDOMEN - 1 VIEW COMPARISON:  CT abdomen and pelvis Apr 17, 2018 FINDINGS: Nasogastric tube tip and side port are in the stomach. There is mild gastric dilatation with air as well as dilatation of multiple loops of small bowel, similar to CT performed earlier in the day. No free air. Lung bases clear. Portal venous air seen on recent CT is not appreciable by radiography. IMPRESSION: Nasogastric tube tip and side port in stomach. Dilated bowel remains. A degree of bowel obstruction must be of concern. No free air evident. Lung bases clear.  Electronically Signed   By: Bretta Bang III M.D.   On: 04/17/2018 07:55   Dg Chest Port 1 View  Result Date: 04/17/2018 CLINICAL DATA:  Central venous catheter EXAM: PORTABLE CHEST 1 VIEW COMPARISON:  04/17/2018, 06/03/2017 FINDINGS: Left-sided central venous catheter tip overlies the brachiocephalic confluence. No pneumothorax. Low lung volumes. Atelectasis versus minimal infiltrate at the right base. Probable tiny effusions. Cardiomediastinal silhouette within normal limits. No pneumothorax. Esophageal tube tip below the diaphragm but not included on the image. IMPRESSION: 1. Left-sided central venous catheter tip overlies the brachiocephalic confluence. No pneumothorax. 2. Low lung volumes with probable tiny effusions. Atelectasis versus minimal infiltrate at the right base Electronically Signed   By: Jasmine Pang M.D.   On: 04/17/2018 20:02   Dg Abd Portable 1v  Result Date: 04/18/2018 CLINICAL DATA:  Nasogastric tube placement EXAM: PORTABLE ABDOMEN - 1 VIEW COMPARISON:  Plain film of the abdomen dated 04/17/2018. FINDINGS: NG tube is well positioned in the stomach. Stomach has been decompressed. The mildly distended gas-filled small bowel loops within the lower abdomen and RIGHT lower quadrant appears stable, perhaps slightly improved. IMPRESSION: Nasogastric tube is adequately positioned in the stomach. Electronically Signed   By: Bary Richard M.D.   On: 04/18/2018 23:09   US Abdomen Limited Ruq  Result Date: 04/17/2018 CLINICAL DATA:  77 year old male with abnormal LFTs. Portal venous gas suspected within the liver on CT today. EXAM: ULTRASOUND ABDOMEN LIMITED RIGHT UPPER QUADRANT COMPARISON:  CT Abdomen and Pelvis 0618 hours today. FINDINGS: Gallbladder: No gallstones or wall thickening visualized. No sonographic Murphy sign noted by sonographer. Common bile duct: Diameter: 3 millimeters, within normal limits (images 11 and 12). Liver: Heterogeneous echotexture with shadowing echogenic  foci in the anterior left hepatic lobe likely corresponding to the abnormal gas seen by CT. Background liver echogenicity may be abnormally decreased, uncertain. Portal vein is patent on color Doppler imaging with normal direction of blood flow towards the liver. Other findings: No free fluid identified. Grossly negative visible right kidney. IMPRESSION: 1. The gallbladder and CBD appear normal by ultrasound. 2. Abnormal heterogeneous appearance of the liver in part related to the parenchymal portal venous gas seen by CT earlier today. Possible but uncertain underlying hepatic edema, such as due to a hepatitis. Electronically Signed   By: Odessa Fleming M.D.   On: 04/17/2018 09:42    Anti-infectives: Anti-infectives (From admission, onward)   Start     Dose/Rate Route Frequency Ordered Stop   04/17/18 1330  piperacillin-tazobactam (ZOSYN) IVPB 3.375 g     3.375 g 12.5 mL/hr over 240 Minutes Intravenous Every 8 hours 04/17/18 0815     04/17/18 1200  piperacillin-tazobactam (ZOSYN) IVPB 3.375 g  Status:  Discontinued     3.375 g 12.5 mL/hr over 240 Minutes Intravenous Every 8 hours 04/17/18 0534 04/17/18 0815   04/17/18 0815  piperacillin-tazobactam (ZOSYN) IVPB 3.375 g  Status:  Discontinued     3.375 g 100 mL/hr over 30 Minutes Intravenous Every 8 hours 04/17/18 0801 04/17/18 0814   04/17/18 0515  piperacillin-tazobactam (ZOSYN) IVPB 3.375 g     3.375 g 100 mL/hr over 30 Minutes Intravenous  Once 04/17/18 0505 04/17/18 4098      Assessment/Plan: s/p Procedure(s): LAPAROSCOPY DIAGNOSTIC (N/A) EXPLORATORY LAPAROTOMY (N/A)  Negative exploratory lap for sepsis, portal venous air  Will leave NG out.  Hold on oral intake until tomorrow Wbc improving Continuing current care  LOS: 2 days    Sharlisa Hollifield A 04/19/2018

## 2018-04-19 NOTE — Progress Notes (Signed)
Informed by nurse tech at [phone] that pt removed NG tube again.  Made two attempts to re-insert tube, both attempts were unsuccessful.  Pt will not allow [place] to continue attempting to place the tube.  Informed provider K. Schorr and the on-coming day shift nurse.  Will continue to monitor.

## 2018-04-19 NOTE — Clinical Social Work Note (Signed)
Clinical Social Work Assessment  Patient Details  Name: Matthew Meza MRN: [id number] Date of Birth: Dec 09, 1940  Date of referral:  04/19/18               Reason for consult:  Discharge Planning                Permission sought to share information with:  Facility Medical sales representative, Family Supports Permission granted to share information::  No  Name::     Pension scheme manager::  [facility]  Relationship::  Daughter  Contact Information:  [phone]  Housing/Transportation Living arrangements for the past 2 months:  Skilled Nursing Facility Source of Information:  Adult Children Patient Interpreter Needed:  None Criminal Activity/Legal Involvement Pertinent to Current Situation/Hospitalization:  No - Comment as needed Significant Relationships:  Adult Children, Siblings Lives with:  Facility Resident Do you feel safe going back to the place where you live?  Yes Need for family participation in patient care:  Yes (Comment)  Care giving concerns:  CSW received consult regarding discharge planning. CSW spoke with patient's daughter. She reported that patient resides at [facility] with long term care. He will return there at discharge. CSW to continue to follow and assist with discharge planning needs.   Social Worker assessment / plan:  CSW spoke with patient's daughter concerning discharge plan when medically stable.   Employment status:  Retired Database administrator PT Recommendations:  Skilled Nursing Facility Information / Referral to community resources:  Skilled Nursing Facility  Patient/Family's Response to care:  Patient's daughter reports agreement with discharge back to [facility]. She requested that a nurse or physician contact her with medical updates. CSW alerted RN.   Patient/Family's Understanding of and Emotional Response to Diagnosis, Current Treatment, and Prognosis:  Patient/family is realistic regarding therapy needs and expressed being hopeful for  return to SNF placement. Patient expressed understanding of CSW role and discharge process as well as medical condition. No questions/concerns about plan or treatment.    Emotional Assessment Appearance:  Appears stated age Attitude/Demeanor/Rapport:  Unable to Assess Affect (typically observed):  Unable to Assess Orientation:  Oriented to Self Alcohol / Substance use:  Not Applicable Psych involvement (Current and /or in the community):  No (Comment)  Discharge Needs  Concerns to be addressed:  Care Coordination Readmission within the last 30 days:  No Current discharge risk:  None Barriers to Discharge:  Continued Medical Work up   [employer], LCSWA 04/19/2018, 11:31 AM

## 2018-04-20 LAB — CBC
HCT: 36.1 % — ABNORMAL LOW (ref 39.0–52.0)
Hemoglobin: 12 g/dL — ABNORMAL LOW (ref 13.0–17.0)
MCH: 30.5 pg (ref 26.0–34.0)
MCHC: 33.2 g/dL (ref 30.0–36.0)
MCV: 91.6 fL (ref 78.0–100.0)
PLATELETS: 144 10*3/uL — AB (ref 150–400)
RBC: 3.94 MIL/uL — ABNORMAL LOW (ref 4.22–5.81)
RDW: 13 % (ref 11.5–15.5)
WBC: 7 10*3/uL (ref 4.0–10.5)

## 2018-04-20 LAB — GLUCOSE, CAPILLARY
Glucose-Capillary: 141 mg/dL — ABNORMAL HIGH (ref 65–99)
Glucose-Capillary: 148 mg/dL — ABNORMAL HIGH (ref 65–99)
Glucose-Capillary: 129 mg/dL — ABNORMAL HIGH (ref 65–99)
Glucose-Capillary: 121 mg/dL — ABNORMAL HIGH (ref 65–99)
Glucose-Capillary: 153 mg/dL — ABNORMAL HIGH (ref 65–99)
Glucose-Capillary: 117 mg/dL — ABNORMAL HIGH (ref 65–99)

## 2018-04-20 LAB — COMPREHENSIVE METABOLIC PANEL
ALBUMIN: 2.8 g/dL — AB (ref 3.5–5.0)
ALK PHOS: 65 U/L (ref 38–126)
ALT: 26 U/L (ref 17–63)
AST: 18 U/L (ref 15–41)
Anion gap: 8 (ref 5–15)
BILIRUBIN TOTAL: 1.2 mg/dL (ref 0.3–1.2)
CALCIUM: 8.3 mg/dL — AB (ref 8.9–10.3)
CO2: 27 mmol/L (ref 22–32)
CREATININE: 0.84 mg/dL (ref 0.61–1.24)
Chloride: 108 mmol/L (ref 101–111)
GFR calc Af Amer: >60 mL/min (ref >60)
GFR calc non Af Amer: >60 mL/min (ref >60)
Glucose, Bld: 136 mg/dL — ABNORMAL HIGH (ref 65–99)
Potassium: 2.9 mmol/L — ABNORMAL LOW (ref 3.5–5.1)
SODIUM: 143 mmol/L (ref 135–145)
Total Protein: 5.2 g/dL — ABNORMAL LOW (ref 6.5–8.1)

## 2018-04-20 LAB — MAGNESIUM: Magnesium: 1.6 mg/dL — ABNORMAL LOW (ref 1.7–2.4)

## 2018-04-20 MED ORDER — MAGNESIUM SULFATE 2 GM/50ML IV SOLN
2.0000 g | Freq: Once | INTRAVENOUS | Status: AC
  Administered 2018-04-20: 2 g via INTRAVENOUS
  Filled 2018-04-20: qty 50

## 2018-04-20 MED ORDER — POTASSIUM CHLORIDE 10 MEQ/100ML IV SOLN
10.0000 meq | INTRAVENOUS | Status: AC
  Administered 2018-04-20 (×2): 10 meq via INTRAVENOUS
  Filled 2018-04-20 (×2): qty 100

## 2018-04-20 NOTE — Care Management Important Message (Signed)
Important Message  Patient Details  Name: Matthew Meza MRN: [id number] Date of Birth: 12/03/1941   Medicare Important Message Given:  Yes   Due to illness pt not able to sign/Unsigned copy left   Kaipo Ardis 04/20/2018, 1:49 PM

## 2018-04-20 NOTE — Care Management Note (Signed)
Case Management Note  Patient Details  Name: Matthew Meza MRN: [id number] Date of Birth: 1941-11-18  Subjective/Objective:  Sepsis, hx of CVA with chronic left-sided deficits, Parkinson's disease, type 2 diabetes on insulin, dementia, hyperlipidemia  Pt from SNF/[facility].            Donnella Bi (Daughter) Etter Sjogren (Sister)    [phone]     PCP: Blair Heys  Action/Plan: Transition back to SNF when medically stable. CSW managing disposition.  Expected Discharge Date:                  Expected Discharge Plan:  Skilled Nursing Facility  In-House Referral:  Clinical Social Work  Discharge planning Services  CM Consult  Post Acute Care Choice:    Choice offered to:     DME Arranged:    DME Agency:     HH Arranged:    HH Agency:     Status of Service:  In process, will continue to follow  If discussed at Long Length of Stay Meetings, dates discussed:    Additional Comments:  Epifanio Lesches, RN 04/20/2018, 9:33 AM

## 2018-04-20 NOTE — Progress Notes (Signed)
Patient ID: Matthew Meza, male   DOB: 01-07-1941, 77 y.o.   MRN: [id number]    3 Days Post-Op  Subjective: Pt is confused.  He can't tell me if he is nauseated or if he has bowel function.  Objective: Vital signs in last 24 hours: Temp:  [97.1 F (36.2 C)-98.8 F (37.1 C)] 98.5 F (36.9 C) (05/17 0727) Pulse Rate:  [88-104] 88 (05/17 0727) Resp:  [17-23] 22 (05/17 0727) BP: (118-148)/(59-87) 129/74 (05/17 0727) SpO2:  [95 %-100 %] 97 % (05/17 0727) Weight:  [76 kg (167 lb 8.8 oz)] 76 kg (167 lb 8.8 oz) (05/17 0531) Last BM Date: (PTA)  Intake/Output from previous day: 05/16 0701 - 05/17 0700 In: 2143.5 [I.V.:1893.5; IV Piggyback:250] Out: 4100 [Urine:3700; Emesis/NG output:400] Intake/Output this shift: No intake/output data recorded.  PE: Abd: softer today, few BS, appropriately tender, midline incision is c/d/i with staples.  Lab Results:  Recent Labs    04/19/18 0438 04/20/18 0338  WBC 10.7* 7.0  HGB 11.5* 12.0*  HCT 35.3* 36.1*  PLT 135* 144*   BMET Recent Labs    04/19/18 0438 04/20/18 0338  NA 142 143  K 3.2* 2.9*  CL 107 108  CO2 27 27  GLUCOSE 94 136*  BUN 7 <5*  CREATININE 0.79 0.84  CALCIUM 8.1* 8.3*   PT/INR No results for input(s): LABPROT, INR in the last 72 hours. CMP     Component Value Date/Time   NA 143 04/20/2018 0338   K 2.9 (L) 04/20/2018 0338   CL 108 04/20/2018 0338   CO2 27 04/20/2018 0338   GLUCOSE 136 (H) 04/20/2018 0338   BUN <5 (L) 04/20/2018 0338   CREATININE 0.84 04/20/2018 0338   CREATININE 1.16 08/10/2016 1324   CALCIUM 8.3 (L) 04/20/2018 0338   PROT 5.2 (L) 04/20/2018 0338   ALBUMIN 2.8 (L) 04/20/2018 0338   AST 18 04/20/2018 0338   ALT 26 04/20/2018 0338   ALKPHOS 65 04/20/2018 0338   BILITOT 1.2 04/20/2018 0338   GFRNONAA >60 04/20/2018 0338   GFRAA >60 04/20/2018 0338   Lipase     Component Value Date/Time   LIPASE 25 04/17/2018 0423       Studies/Results: Dg Abd Portable 1v  Result Date:  04/18/2018 CLINICAL DATA:  Nasogastric tube placement EXAM: PORTABLE ABDOMEN - 1 VIEW COMPARISON:  Plain film of the abdomen dated 04/17/2018. FINDINGS: NG tube is well positioned in the stomach. Stomach has been decompressed. The mildly distended gas-filled small bowel loops within the lower abdomen and RIGHT lower quadrant appears stable, perhaps slightly improved. IMPRESSION: Nasogastric tube is adequately positioned in the stomach. Electronically Signed   By: Bary Richard M.D.   On: 04/18/2018 23:09    Anti-infectives: Anti-infectives (From admission, onward)   Start     Dose/Rate Route Frequency Ordered Stop   04/17/18 1330  piperacillin-tazobactam (ZOSYN) IVPB 3.375 g     3.375 g 12.5 mL/hr over 240 Minutes Intravenous Every 8 hours 04/17/18 0815     04/17/18 1200  piperacillin-tazobactam (ZOSYN) IVPB 3.375 g  Status:  Discontinued     3.375 g 12.5 mL/hr over 240 Minutes Intravenous Every 8 hours 04/17/18 0534 04/17/18 0815   04/17/18 0815  piperacillin-tazobactam (ZOSYN) IVPB 3.375 g  Status:  Discontinued     3.375 g 100 mL/hr over 30 Minutes Intravenous Every 8 hours 04/17/18 0801 04/17/18 0814   04/17/18 0515  piperacillin-tazobactam (ZOSYN) IVPB 3.375 g     3.375 g 100 mL/hr over  30 Minutes Intravenous  Once 04/17/18 0505 04/17/18 4782       Assessment/Plan  DM H/o CVA with residual left sided weakness -holdplavix Dementia HTN HLD GERD  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  He did have some hyperemic appears bowel with dilatation, but no definite malrotation at the time or surgery or compromised bowel. -labs have all improved. -keep NPO x sips of liquids from the floor.  Minimal BS, but less distended -mobilize and pulm toilet.     FEN - NPO, but may have clears from the floor VTE - Lovenox/SCDs/ can resume plavix from our standpoint when  medicine good with it ID - zosyn; however, no evidence of infection intra-abdominally.  Will defer further abx therapy to primary service.  Dispo - await bowel function, PT eval for mobilization    LOS: 3 days    Letha Cape , [facility] 04/20/2018, 11:12 AM Pager: [phone]

## 2018-04-20 NOTE — Progress Notes (Signed)
PROGRESS NOTE    Patient: Matthew Meza     PCP: Blair Heys, MD                    DOB: [birth year]            DOA: 04/17/2018 [id number]             DOS: 04/20/2018, 1:52 PM   LOS: 3 days   Date of Service: The patient was seen and examined on 04/20/2018  Subjective:  Postop day #3 status post exploratory laparotomy for portal venous gas with partial malrotation of the small bowel.  Patient was seen and examined this morning, confused, soft restraint mitten is in place on her right upper extremity. 2 nights ago patient has pulled out the his NG tube, he has been n.p.o., he has not had any nausea or vomiting, he has not had any gas or bowel movements, per nursing staff.  No issues overnight, stable this morning.  ----------------------------------------------------------------------------------------------------------------------  Brief Narrative:   Matthew Meza is a 77 y.o. male with medical history significant of CVA with chronic left-sided deficits, Parkinson's disease, type 2 diabetes on insulin, dementia, hyperlipidemia who comes in with one episode of coffee-ground emesis and portal venous gas, from skilled nursing facility.  In ED patient was found tachycardic, tachypneic, hypertensive.  With WBC of 27.8, lactic acid was almost 5.  CT of abdomen pelvis reported portal venous gas but no acute process mildly elevated LFTs, Initiated on sepsis protocol of IV fluids and IV antibiotics, NGT was inserted. Neurosurgery was consulted, patient was urgently taken to OR.  Exploratory laparotomy on 04/17/2018. Cover to help portal venous gas with partial malrotation of the small bowel.    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 -Surgery following -Remains to be confused, swallowing needs to be evaluated before initiating oral diet.  Sepsis with nausea, vomiting/portal venous gas: -Nausea vomiting has improved, he is spontaneously has removed his NG tube. -Per nursing staff he has not had any emesis overnight. -we will Continue supportive therapy, IV antibiotics, -Blood pressure stable, afebrile, WBCs of 13.7 -->10.7--->7.0 -Lactic acid 5.9 -->2.3----->1.1  -IV famotidine -On IV fluid to D5 half-normal saline with potassium. -Continue IV Zosyn  Hypokalemia -Additional runs of potassium will be given IV -Checking magnesium and depleting accordingly   Elevated LFTs:  -Mild, monitoring LFTs -Right upper quadrant ultrasound: CBD normal,Abnormal heterogeneous appearance of the liver in part related to the parenchymal portal venous gas seen by CT    H/o CVA with dense left-sided deficits: - Hold clopidogrel 75 mg daily -Continue baclofen 3 times daily  Parkinson's: Suspect this is most likely related to stroke. -Continue amantadine 200 mg twice daily  Type 2 diabetes: -Hold scheduled insulin with meals -Sliding scale insulin every 4 hours  Hyperlipidemia: -Continue atorvastatin 40 mg daily  DVT prophylaxis:   SCDs/compression stockings    Lovenox Reeds  Code Status:         Full code  Family Communication:  The above findings and plan of care has been discussed with patient he is mildly lethargic, questionable understanding.  Nursing staff at the bedside was informed of the plan.  . Disposition  Plan:   > 3 days             back to skilled nursing facility Consultants:   General surgery  Procedures:    Status post exploratory laparotomy on 04/17/2018  Antimicrobials:  Anti-infectives (From admission, onward)   Start     Dose/Rate Route Frequency Ordered Stop   04/17/18 1330  piperacillin-tazobactam (ZOSYN) IVPB 3.375 g     3.375 g 12.5 mL/hr over 240 Minutes Intravenous Every 8 hours 04/17/18 0815     04/17/18 1200  piperacillin-tazobactam (ZOSYN) IVPB 3.375 g  Status:  Discontinued     3.375 g 12.5 mL/hr over 240 Minutes Intravenous Every 8 hours 04/17/18 0534 04/17/18 0815   04/17/18 0815  piperacillin-tazobactam (ZOSYN) IVPB 3.375 g  Status:  Discontinued     3.375 g 100 mL/hr over 30 Minutes Intravenous Every 8 hours 04/17/18 0801 04/17/18 0814   04/17/18 0515  piperacillin-tazobactam (ZOSYN) IVPB 3.375 g     3.375 g 100 mL/hr over 30 Minutes Intravenous  Once 04/17/18 0505 04/17/18 0633       Objective:  Intake/Output Summary (Last 24 hours) at 04/20/2018 1352 Last data filed at 04/20/2018 0626 Gross per 24 hour  Intake 2143.5 ml  Output 2950 ml  Net -806.5 ml   Filed Weights   04/17/18 0538 04/19/18 0600 04/20/18 0531  Weight: 86.2 kg (190 lb) 78.6 kg (173 lb 4.5 oz) 76 kg (167 lb 8.8 oz)   BP 125/75   Pulse 73   Temp 98.4 F (36.9 C) (Oral)   Resp 18   Ht 6' (1.829 m)   Wt 76 kg (167 lb 8.8 oz)   SpO2 100%   BMI 22.72 kg/m    Physical Exam  Constitution: Awake but confused, comfortably laying in bed, soft restraints in upper extremity in place Psychiatric: Confused with no agitation HEENT: Normocephalic, PERRL, otherwise with in Normal limits  Cardio vascular:  S1/S2, RRR, No murmure, No Rubs or Gallops  Chest/pulmonary: Clear to auscultation bilaterally, respirations unlabored  Chest symmetric Abdomen: Soft, hypoactive bowel sounds, abdominal surgical wound visible,  muscular skeletal:  Change in left side weakness., moving right extremity  in the bed  neuro: Limited exam as patient is confused, laying comfortably in bed, cranial nerves seems to be intact.  Confused, sided hemiplegia Extremities: Positive pulses bilaterally, +1 pitting edema on the left hand/arm, no edema lower extremities Skin: Dry, warm to touch, abdominal surgical wound dressing in place negative edema erythema     Data Reviewed: I have personally reviewed following labs and imaging studies  CBC: Recent Labs  Lab 04/17/18 0423 04/17/18 2305 04/18/18 0213 04/19/18 0438 04/20/18 0338  WBC 27.8* 14.4* 13.7* 10.7* 7.0  NEUTROABS 25.0*  --   --   --   --   HGB 19.2* 12.0* 12.2* 11.5* 12.0*  HCT 56.2* 37.1* 37.9* 35.3* 36.1*  MCV 95.7 96.1 95.0 94.1 91.6  PLT 241 159 148* 135* 144*   Basic Metabolic Panel: Recent Labs  Lab 04/17/18 0423 04/17/18 2305 04/18/18 0213 04/19/18 0438 04/20/18 0338  NA 141 143 141 142 143  K 4.4 3.8 4.0 3.2*  2.9*  CL 103 113* 111 107 108  CO2 GLUCOSE 166* 125* 142* 94 136*  BUN <5*  CREATININE 1.05 0.95 0.84 0.79 0.84  CALCIUM 9.6 7.1* 7.7* 8.1* 8.3*  MG  --   --   --   --  1.6*   GFR: Estimated Creatinine Clearance: 80.4 mL/min (by C-G formula based on SCr of 0.84 mg/dL). Liver Function Tests: Recent Labs  Lab 04/17/18 0423 04/17/18 2305 04/18/18 0213 04/19/18 0438 04/20/18 0338  AST 46* ALT 87* 36 36 26 26  ALKPHOS 147* 63 67 61 65  BILITOT 1.4* 0.9 1.2 1.5* 1.2  PROT 7.5 4.6* 4.9* 5.0* 5.2*  ALBUMIN 4.2 2.8* 2.9* 2.7* 2.8*   Recent Labs  Lab 04/17/18 0423  LIPASE 25   CBG: Recent Labs  Lab 04/19/18 2041 04/19/18 2348 04/20/18 0530 04/20/18 0837 04/20/18 1217  GLUCAP 102* 104* 148* 129* 121*   LSepsis Labs: Recent Labs  Lab 04/17/18 2122 04/18/18 0213 04/18/18 0905 04/18/18 1153  LATICACIDVEN 5.8* 2.3* 1.6 1.1       Radiology Studies: Dg Abdomen 1 View Result Date: 04/17/2018 CLINICAL DATA:  Acute onset of generalized abdominal pain,  diarrhea and hematemesis. EXAM: CT ABDOMEN AND PELVIS WITH CONTRAST TECHNIQUE: Multidetector CT imaging of the abdomen and pelvis was performed using the standard protocol following bolus administration of intravenous contrast. CONTRAST:  10rta and its branches. The abdominal aorta is otherwise grossly unremarkable. The inferior vena cava is grossly unremarkable. No retroperitoneal lymphadenopathy is seen. No pelvic sidewall lymphadenopathy is identified. Reproductive: The bladder is mildly distended and grossly unremarkable. The prostate is enlarged, measuring 5.9 cm in transverse dimension, with heterogeneity. Other: No additional soft tissue abnormalities are seen. Musculoskeletal: No acute osseous abnormalities are identified. The visualized musculature is unremarkable in appearance. IMPRESSION: 1. Portal venous gas within the liver, of uncertain etiology. No evidence of pneumatosis or bowel ischemia on provided images. 2. Distention of small-bowel loops to 4.0 cm in maximal diameter, with gradual decompression at the right lower quadrant. This may simply reflect some degree of small bowel dysmotility; no evidence of bowel obstruction. 3. Slight wall thickening along the mid ileum could reflect a mild infectious or inflammatory process. 4. Enlarged prostate.  Would correlate with PSA. 5. 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 Aortic Atherosclerosis (ICD10-I70.0). These results were called by telephone at the time of interpretation on 04/17/2018 at 6:42 am to Dr. Ross Marcus , who verbally acknowledged these results. Electronically Signed: By: Roanna Raider M.D. On: 04/17/2018 06:46   Dg Chest Port 1 View  Result Date: 04/17/2018 CLINICAL DATA:  Central venous catheter EXAM: PORTABLE CHEST 1 VIEW COMPARISON:  04/17/2018, 06/03/2017 FINDINGS: Left-sided central venous catheter tip overlies the brachiocephalic confluence. No pneumothorax. Low lung volumes. Atelectasis versus minimal  infiltrate at the right base. Probable tiny effusions. Cardiomediastinal silhouette within normal limits. No pneumothorax. Esophageal tube tip below the diaphragm but not included on the image. IMPRESSION: 1. Left-sided central venous catheter tip overlies the brachiocephalic confluence. No pneumothorax. 2. Low lung volumes with probable tiny effusions. Atelectasis versus minimal infiltrate at the right base Electronically Signed   By: Jasmine Pang M.D.   On: 04/17/2018 20:02   Dg Abdomen Acute W/chest  Result Date: 04/17/2018 CLINICAL DATA:  Acute onset of vomiting. EXAM: DG ABDOMEN ACUTE W/ 1V CHEST COMPARISON:  Chest radiograph performed 06/03/2017, and lumbar spine radiographs performed 06/02/2013 FINDINGS:  The lungs are hypoexpanded but appear grossly clear. There is no evidence of focal opacification, pleural effusion or pneumothorax. The cardiomediastinal silhouette is within normal limits. There is distention of small-bowel loops to 4.5 cm in maximal diameter, concerning for some degree of small bowel obstruction. The colon is not well assessed. The stomach is largely filled with air and fluid. No free intra-abdominal air is identified on the provided upright view. No acute osseous abnormalities are seen; the sacroiliac joints are unremarkable in appearance. IMPRESSION: 1. Dilatation of small-bowel loops to 4.5 cm in maximal diameter, concerning for some degree of small bowel obstruction. No free intra-abdominal air seen. 2. Lungs hypoexpanded but grossly clear. These results were called by telephone at the time of interpretation on 04/17/2018 at 6:20 am to Dr. Ross Marcus, who verbally acknowledged these results. Electronically Signed   By: Roanna Raider M.D.   On: 04/17/2018 06:21   US Abdomen Limited Ruq  Result Date: 04/17/2018 CLINICAL DATA:  77 year old male with abnormal LFTs. Portal venous gas suspected within the liver on CT today. EXAM: ULTRASOUND ABDOMEN LIMITED RIGHT UPPER QUADRANT  COMPARISON:  CT Abdomen and Pelvis 0618 hours today. FINDINGS: Gallbladder: No gallstones or wall thickening visualized. No sonographic Murphy sign noted by sonographer. Common bile duct: Diameter: 3 millimeters, within normal limits (images 11 and 12). Liver: Heterogeneous echotexture with shadowing echogenic foci in the anterior left hepatic lobe likely corresponding to the abnormal gas seen by CT. Background liver echogenicity may be abnormally decreased, uncertain. Portal vein is patent on color Doppler imaging with normal direction of blood flow towards the liver. Other findings: No free fluid identified. Grossly negative visible right kidney. IMPRESSION: 1. The gallbladder and CBD appear normal by ultrasound. 2. Abnormal heterogeneous appearance of the liver in part related to the parenchymal portal venous gas seen by CT earlier today. Possible but uncertain underlying hepatic edema, such as due to a hepatitis. Electronically Signed   By: Odessa Fleming M.D.   On: 04/17/2018 09:42    Scheduled Meds: . amantadine  200 mg Per Tube BID  . chlorhexidine  15 mL Mouth Rinse BID  . enoxaparin (LOVENOX) injection  40 mg Subcutaneous Daily  . insulin aspart  0-9 Units Subcutaneous TID WC  . mouth rinse  15 mL Mouth Rinse q12n4p  . sodium chloride flush  10-40 mL Intracatheter Q12H   Continuous Infusions: . sodium chloride    . dextrose 5 % and 0.45 % NaCl with KCl 20 mEq/L 100 mL/hr at 04/20/18 0728  . famotidine (PEPCID) IV Stopped (04/20/18 1012)  . lactated ringers Stopped (04/19/18 0831)  . piperacillin-tazobactam (ZOSYN)  IV 3.375 g (04/20/18 1248)  . sodium chloride      Time spent: >35 minutes  Kendell Bane, MD [facility],  Pager [phone]  If 7PM-7AM, please contact night-coverage [email or web address]   Password [email or web address]  04/20/2018, 1:52 PM

## 2018-04-20 NOTE — Progress Notes (Signed)
Physical Therapy Treatment Patient Details Name: Matthew Meza MRN: [id number] DOB: [birth year] Today's Date: 04/20/2018    History of Present Illness Pt is a 77 y/o male admitted from SNF secondary to bloody emesis. Pt is s/p exploratory laparascopy on 5/14. PMH includes CVA with L sided weakness, HTN, DM, Dementia, and parkinsons.     PT Comments    Patient seen for activity progression. Required +2 max assist to come to sitting position at EOB, tolerated minimal therapeutic exercises in sitting/supine. Returned to supine and repositioned. Attempted to educate on positioning re: LUE for edema control, poorly receptive. Will continue to see and progress as tolerated.   Follow Up Recommendations  SNF     Equipment Recommendations  None recommended by PT    Recommendations for Other Services       Precautions / Restrictions Precautions Precautions: Fall;Other (comment) Restrictions Weight Bearing Restrictions: No    Mobility  Bed Mobility Overal bed mobility: Needs Assistance Bed Mobility: Rolling;Supine to Sit;Sit to Supine Rolling: Max assist   Supine to sit: +2 for physical assistance;Max assist Sit to supine: +2 for physical assistance;Max assist   General bed mobility comments: +2 max assist for all aspects of bed mobility. Patient was able to utilize RUE to reach for rail when attempting to roll. Upone elevation to upright, patient pushing with RUE requiring manual assist to place RUE in lab and prevent push back  Transfers                 General transfer comment: unable to perform, unsafe to attempt  Ambulation/Gait                 Stairs             Wheelchair Mobility    Modified Rankin (Stroke Patients Only) Modified Rankin (Stroke Patients Only) Pre-Morbid Rankin Score: Severe disability Modified Rankin: Severe disability     Balance Overall balance assessment: Needs assistance Sitting-balance support: Feet  supported Sitting balance-Leahy Scale: Zero Sitting balance - Comments: required moderate to at most times max assist just to sit EOB, could not maintain upright. Heavy posterior bias and pushing RUE Postural control: Posterior lean(significant trunk extensor tone)                                  Cognition Arousal/Alertness: Awake/alert Behavior During Therapy: WFL for tasks assessed/performed Overall Cognitive Status: History of cognitive impairments - at baseline                                        Exercises General Exercises - Lower Extremity Ankle Circles/Pumps: AROM;Right;10 reps Long Arc Quad: Right;Left;10 reps;AROM;AAROM(self assist using RLE to elevate LLE with sitting EOB) Other Exercises Other Exercises: heel cord ROM performed supine     General Comments        Pertinent Vitals/Pain Pain Assessment: Faces Faces Pain Scale: Hurts little more Pain Location: stomach  Pain Descriptors / Indicators: Guarding Pain Intervention(s): Monitored during session    Home Living                      Prior Function            PT Goals (current goals can now be found in the care plan section) Acute Rehab PT Goals Patient Stated Goal: none  stated  PT Goal Formulation: Patient unable to participate in goal setting Time For Goal Achievement: 05/02/18 Potential to Achieve Goals: Fair Progress towards PT goals: Progressing toward goals    Frequency    Min 2X/week      PT Plan Current plan remains appropriate    Co-evaluation              AM-PAC PT "6 Clicks" Daily Activity  Outcome Measure  Difficulty turning over in bed (including adjusting bedclothes, sheets and blankets)?: Unable Difficulty moving from lying on back to sitting on the side of the bed? : Unable Difficulty sitting down on and standing up from a chair with arms (e.g., wheelchair, bedside commode, etc,.)?: Unable Help needed moving to and from a bed  to chair (including a wheelchair)?: Total Help needed walking in hospital room?: Total Help needed climbing 3-5 steps with a railing? : Total 6 Click Score: 6    End of Session   Activity Tolerance: Patient tolerated treatment well Patient left: in bed;with call bell/phone within reach;with bed alarm set;with family/visitor present Nurse Communication: Mobility status PT Visit Diagnosis: Other abnormalities of gait and mobility (R26.89);Difficulty in walking, not elsewhere classified (R26.2);Hemiplegia and hemiparesis Hemiplegia - Right/Left: Left Hemiplegia - caused by: Cerebral infarction     Time: 1040-1058 PT Time Calculation (min) (ACUTE ONLY): 18 min  Charges:  $Therapeutic Activity: 8-22 mins                    G Codes:       Charlotte Crumb, PT DPT  Board Certified Neurologic Specialist [phone]    Fabio Asa 04/20/2018, 11:13 AM

## 2018-04-21 LAB — CBC
HCT: 35.6 % — ABNORMAL LOW (ref 39.0–52.0)
HEMOGLOBIN: 12 g/dL — AB (ref 13.0–17.0)
MCH: 30.9 pg (ref 26.0–34.0)
MCHC: 33.7 g/dL (ref 30.0–36.0)
MCV: 91.8 fL (ref 78.0–100.0)
Platelets: 169 10*3/uL (ref 150–400)
RBC: 3.88 MIL/uL — AB (ref 4.22–5.81)
RDW: 13.2 % (ref 11.5–15.5)
WBC: 6.8 10*3/uL (ref 4.0–10.5)

## 2018-04-21 LAB — COMPREHENSIVE METABOLIC PANEL
ALK PHOS: 60 U/L (ref 38–126)
ALT: 23 U/L (ref 17–63)
AST: 18 U/L (ref 15–41)
Albumin: 2.7 g/dL — ABNORMAL LOW (ref 3.5–5.0)
Anion gap: 7 (ref 5–15)
BILIRUBIN TOTAL: 1.2 mg/dL (ref 0.3–1.2)
CO2: 28 mmol/L (ref 22–32)
CREATININE: 0.81 mg/dL (ref 0.61–1.24)
Calcium: 8.1 mg/dL — ABNORMAL LOW (ref 8.9–10.3)
Chloride: 107 mmol/L (ref 101–111)
GFR calc Af Amer: >60 mL/min (ref >60)
Glucose, Bld: 133 mg/dL — ABNORMAL HIGH (ref 65–99)
POTASSIUM: 3.1 mmol/L — AB (ref 3.5–5.1)
Sodium: 142 mmol/L (ref 135–145)
TOTAL PROTEIN: 5.2 g/dL — AB (ref 6.5–8.1)

## 2018-04-21 LAB — GLUCOSE, CAPILLARY
Glucose-Capillary: 117 mg/dL — ABNORMAL HIGH (ref 65–99)
Glucose-Capillary: 117 mg/dL — ABNORMAL HIGH (ref 65–99)
Glucose-Capillary: 118 mg/dL — ABNORMAL HIGH (ref 65–99)
Glucose-Capillary: 118 mg/dL — ABNORMAL HIGH (ref 65–99)
Glucose-Capillary: 128 mg/dL — ABNORMAL HIGH (ref 65–99)
Glucose-Capillary: 144 mg/dL — ABNORMAL HIGH (ref 65–99)

## 2018-04-21 MED ORDER — MAGNESIUM SULFATE IN D5W 1-5 GM/100ML-% IV SOLN
1.0000 g | Freq: Once | INTRAVENOUS | Status: AC
  Administered 2018-04-21: 1 g via INTRAVENOUS
  Filled 2018-04-21: qty 100

## 2018-04-21 MED ORDER — POTASSIUM CHLORIDE 10 MEQ/100ML IV SOLN
10.0000 meq | INTRAVENOUS | Status: AC
  Administered 2018-04-21 (×4): 10 meq via INTRAVENOUS
  Filled 2018-04-21 (×4): qty 100

## 2018-04-21 NOTE — Progress Notes (Signed)
PROGRESS NOTE    Matthew Meza  ZOX:[id number] DOB: [birth year] DOA: 04/17/2018 PCP: Blair Heys, MD   Brief Narrative:   77 year old with previous history of CVA post chronic left-sided deficit, Parkinson's disease, diabetes mellitus type 2-insulin-dependent, dementia, hyperlipidemia came to the hospital after having coffee-ground emesis and was noted to have portal vein gas.  In the ED he appeared to be septic with elevated WBC and lactate.  CT of the abdomen pelvis showed portal vein gas but no other acute process.  He was started on IV-, antibiotics and NG tube was placed.  General surgery took the patient to the OR for expiratory laparotomy on 5/14.  Since then patient has been slowly improving as her WBC has trended down so related.  His bowel sounds remain hypoactive.  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  Advance diet as tolerated -Getting aggressive repletion for magnesium and  potassium.  Continue IV fluids.  Continue supportive care -General surgery is following  Sepsis with some nausea and vomiting -Likely from intra-abdominal pathology.  Currently slowly improving with current management -We will continue IV Zosyn and gentle hydration.  Aggressively replete his electrolytes  Hypokalemia -Replete electrolytes PRN  Diabetes mellitus type 2 -Accu-Cheks and sliding scale  History of Parkinson's -Continue amantadine  Previous history of CVA with left-sided  deficit -Holding Plavix.  Will restart when okay with surgery -Continue baclofen  DVT prophylaxis: SCDs Code Status: Full code Family Communication: None at bedside Disposition Plan: Maintain inpatient stay  Consultants:   General surgery  Procedures:   Exploratory laparotomy 5/14  Antimicrobials:   IV Zosyn   Subjective: Patient is unable to provide any meaningful history.  He is laying in the bed, alert and awake.  Denies any complaints.  Review of Systems Otherwise negative except as per HPI, including: Unable to get thorough history given his mentation.  Objective: Vitals:   04/21/18 0348 04/21/18 0501 04/21/18 0734 04/21/18 1200  BP:  (!) 149/94    Pulse:  98    Resp:  18    Temp: 97.9 F (36.6 C) 98.6 F (37 C) 98.1 F (36.7 C) 98.5 F (36.9 C)  TempSrc:  Oral Oral Oral  SpO2:  99%    Weight:      Height:        Intake/Output Summary (Last 24 hours) at 04/21/2018 1314 Last data filed at 04/21/2018 1207 Gross per 24 hour  Intake 2761.66 ml  Output 3300 ml  Net -538.34 ml   Filed Weights   04/19/18 0600 04/20/18 0531 04/21/18 0300  Weight: 78.6 kg (173 lb 4.5 oz) 76 kg (167 lb 8.8 oz) 75.5 kg (166 lb 7.2 oz)    Examination:  General exam: Appea patient is awake but he is confused and comfortably laying in the bed.  Elderly frail-appearing Respiratory system: Clear to auscultation. Respiratory effort normal. Cardiovascular system: S1 & S2 heard, RRR. No JVD, murmurs, rubs, gallops or clicks. No  pedal edema. Gastrointestinal system: Abdomen is nondistended, soft and nontender. No organomegaly or masses felt.  Decreased bowel sounds Central nervous system: Alert and oriented x1.  Chronic left-sided weakness noted Extremities: Symmetric 5 x 5 power. Skin: No rashes, lesions or ulcers Psychiatry: Judgement and insight appear normal. Mood & affect appropriate.     Data Reviewed:   CBC: Recent Labs  Lab 04/17/18 0423 04/17/18 2305 04/18/18 0213  04/19/18 0438 04/20/18 0338 04/21/18 0322  WBC 27.8* 14.4* 13.7* 10.7* 7.0 6.8  NEUTROABS 25.0*  --   --   --   --   --   HGB 19.2* 12.0* 12.2* 11.5* 12.0* 12.0*  HCT 56.2* 37.1* 37.9* 35.3* 36.1* 35.6*  MCV 95.7 96.1 95.0 94.1 91.6 91.8  PLT 241 159 148* 135* 144* 169   Basic Metabolic Panel: Recent Labs  Lab 04/17/18 2305 04/18/18 0213 04/19/18 0438 04/20/18 0338 04/21/18 0322  NA 143 141 142 143 142  K 3.8 4.0 3.2* 2.9* 3.1*  CL 113* 111 107 108 107  CO2 GLUCOSE 125* 142* 94 136* 133*  BUN <5* <5*  CREATININE 0.95 0.84 0.79 0.84 0.81  CALCIUM 7.1* 7.7* 8.1* 8.3* 8.1*  MG  --   --   --  1.6*  --    GFR: Estimated Creatinine Clearance: 82.9 mL/min (by C-G formula based on SCr of 0.81 mg/dL). Liver Function Tests: Recent Labs  Lab 04/17/18 2305 04/18/18 0213 04/19/18 0438 04/20/18 0338 04/21/18 0322  AST ALT 36 36 ALKPHOS 63 67 61 65 60  BILITOT 0.9 1.2 1.5* 1.2 1.2  PROT 4.6* 4.9* 5.0* 5.2* 5.2*  ALBUMIN 2.8* 2.9* 2.7* 2.8* 2.7*   Recent Labs  Lab 04/17/18 0423  LIPASE 25   No results for input(s): AMMONIA in the last 168 hours. Coagulation Profile: No results for input(s): INR, PROTIME in the last 168 hours. Cardiac Enzymes: No results for input(s): CKTOTAL, CKMB, CKMBINDEX, TROPONINI in the last 168 hours. BNP (last 3 results) No results for input(s): PROBNP in the last 8760 hours. HbA1C: No results for input(s): HGBA1C in the last 72 hours. CBG: Recent Labs  Lab 04/20/18 2019 04/21/18 0046 04/21/18 0550 04/21/18 0850 04/21/18 1158  GLUCAP 117* 128* 117* 118* 118*   Lipid Profile: No results for input(s): CHOL, HDL, LDLCALC, TRIG, CHOLHDL, LDLDIRECT in the last 72 hours. Thyroid Function Tests: No results for input(s): TSH, T4TOTAL, FREET4, T3FREE, THYROIDAB in the last 72 hours. Anemia Panel: No results for input(s): VITAMINB12, FOLATE, FERRITIN, TIBC, IRON, RETICCTPCT in the last 72  hours. Sepsis Labs: Recent Labs  Lab 04/17/18 2122 04/18/18 0213 04/18/18 0905 04/18/18 1153  LATICACIDVEN 5.8* 2.3* 1.6 1.1    Recent Results (from the past 240 hour(s))  Blood Culture (routine x 2)     Status: None (Preliminary result)   Collection Time: 04/17/18  4:25 AM  Result Value Ref Range Status   Specimen Description BLOOD RIGHT WRIST  Final   Special Requests   Final    BOTTLES DRAWN AEROBIC AND ANAEROBIC Blood Culture adequate volume   Culture   Final    NO GROWTH 4 DAYS Performed at [facility], [address] [place], [place] [address]    Report Status PENDING  Incomplete  Blood Culture (routine x 2)     Status: None (Preliminary result)   Collection Time: 04/17/18  5:39 AM  Result Value  Ref Range Status   Specimen Description BLOOD RIGHT HAND  Final   Special Requests   Final    BOTTLES DRAWN AEROBIC AND ANAEROBIC Blood Culture adequate volume   Culture   Final    NO GROWTH 4 DAYS Performed at [facility], [address], [place], [place] [address]    Report Status PENDING  Incomplete  MRSA PCR Screening     Status: None   Collection Time: 04/18/18  2:05 PM  Result Value Ref Range Status   MRSA by PCR NEGATIVE NEGATIVE Final    Comment:        The GeneXpert MRSA Assay (FDA approved for NASAL specimens only), is one component of a comprehensive MRSA colonization surveillance program. It is not intended to diagnose MRSA infection nor to guide or monitor treatment for MRSA infections. Performed at [facility], [address], [place], [place] [address]          Radiology Studies: No results found.      Scheduled Meds: . amantadine  200 mg Per Tube BID  . chlorhexidine  15 mL Mouth Rinse BID  . enoxaparin (LOVENOX) injection  40 mg Subcutaneous Daily  . insulin aspart  0-9 Units Subcutaneous TID WC  . mouth rinse  15 mL Mouth Rinse q12n4p  . sodium chloride flush  10-40 mL Intracatheter Q12H   Continuous  Infusions: . sodium chloride    . dextrose 5 % and 0.45 % NaCl with KCl 20 mEq/L 100 mL/hr at 04/21/18 0439  . famotidine (PEPCID) IV Stopped (04/21/18 0935)  . lactated ringers Stopped (04/19/18 0831)  . magnesium sulfate 1 - 4 g bolus IVPB 1 g (04/21/18 1300)  . piperacillin-tazobactam (ZOSYN)  IV Stopped (04/21/18 0946)  . sodium chloride       LOS: 4 days    I have spent 35 minutes face to face with the patient and on the ward discussing the patients care, assessment, plan and disposition with other care givers. >50% of the time was devoted counseling the patient about the risks and benefits of treatment and coordinating care.     Tyneshia Stivers Joline Maxcy, MD [facility] Pager [phone]   If 7PM-7AM, please contact night-coverage [email or web address] Password [email or web address] 04/21/2018, 1:14 PM

## 2018-04-21 NOTE — Progress Notes (Signed)
[facility] Progress Note  4 Days Post-Op  Subjective: CC-  No abdominal complaints this morning. Patient awake and alert. Denies abdominal pain, nausea, or vomiting. States that he is not passing flatus nor had a BM.  Objective: Vital signs in last 24 hours: Temp:  [97.9 F (36.6 C)-99.1 F (37.3 C)] 98.1 F (36.7 C) (05/18 0734) Pulse Rate:  [73-102] 98 (05/18 0501) Resp:  [18-20] 18 (05/18 0501) BP: (117-149)/(74-94) 149/94 (05/18 0501) SpO2:  [99 %-100 %] 99 % (05/18 0501) Weight:  [75.5 kg (166 lb 7.2 oz)] 75.5 kg (166 lb 7.2 oz) (05/18 0300) Last BM Date: (PTA)  Intake/Output from previous day: 05/17 0701 - 05/18 0700 In: 2761.7 [I.V.:2511.7; IV Piggyback:250] Out: 1800 [Urine:1800] Intake/Output this shift: No intake/output data recorded.  PE: Gen:  Alert, NAD, pleasant HEENT: EOM's intact, pupils equal and round Pulm:  CTAB, no W/R/R, effort normal Abd: Soft, mild distension, nontender, tinkling BS, midline incision C/D/I with honeycomb dressing in place/some dried blood noted Skin: warm and dry  Lab Results:  Recent Labs    04/20/18 0338 04/21/18 0322  WBC 7.0 6.8  HGB 12.0* 12.0*  HCT 36.1* 35.6*  PLT 144* 169   BMET Recent Labs    04/20/18 0338 04/21/18 0322  NA 143 142  K 2.9* 3.1*  CL 108 107  CO2 27 28  GLUCOSE 136* 133*  BUN <5* <5*  CREATININE 0.84 0.81  CALCIUM 8.3* 8.1*   PT/INR No results for input(s): LABPROT, INR in the last 72 hours. CMP     Component Value Date/Time   NA 142 04/21/2018 0322   K 3.1 (L) 04/21/2018 0322   CL 107 04/21/2018 0322   CO2 28 04/21/2018 0322   GLUCOSE 133 (H) 04/21/2018 0322   BUN <5 (L) 04/21/2018 0322   CREATININE 0.81 04/21/2018 0322   CREATININE 1.16 08/10/2016 1324   CALCIUM 8.1 (L) 04/21/2018 0322   PROT 5.2 (L) 04/21/2018 0322   ALBUMIN 2.7 (L) 04/21/2018 0322   AST 18 04/21/2018 0322   ALT 23 04/21/2018 0322   ALKPHOS 60 04/21/2018 0322   BILITOT 1.2 04/21/2018 0322    GFRNONAA >60 04/21/2018 0322   GFRAA >60 04/21/2018 0322   Lipase     Component Value Date/Time   LIPASE 25 04/17/2018 0423       Studies/Results: No results found.  Anti-infectives: Anti-infectives (From admission, onward)   Start     Dose/Rate Route Frequency Ordered Stop   04/17/18 1330  piperacillin-tazobactam (ZOSYN) IVPB 3.375 g     3.375 g 12.5 mL/hr over 240 Minutes Intravenous Every 8 hours 04/17/18 0815     04/17/18 1200  piperacillin-tazobactam (ZOSYN) IVPB 3.375 g  Status:  Discontinued     3.375 g 12.5 mL/hr over 240 Minutes Intravenous Every 8 hours 04/17/18 0534 04/17/18 0815   04/17/18 0815  piperacillin-tazobactam (ZOSYN) IVPB 3.375 g  Status:  Discontinued     3.375 g 100 mL/hr over 30 Minutes Intravenous Every 8 hours 04/17/18 0801 04/17/18 0814   04/17/18 0515  piperacillin-tazobactam (ZOSYN) IVPB 3.375 g     3.375 g 100 mL/hr over 30 Minutes Intravenous  Once 04/17/18 0505 04/17/18 1610       Assessment/Plan DM H/o CVA with residual left sided weakness Dementia HTN HLD GERD  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 Donell Beers, no definitive findings to explain lactic acidosis, WBC, and portal venous gas. He did have some hyperemic appears  bowel with dilatation, but no definite malrotation at the time or surgery or compromised bowel. -POD4 -no BM or flatus  FEN -NPO, but may have clears from the floor VTE -Lovenox/SCDs/ can resume plavix from our standpoint ID -[email or web address]; however, no evidence of infection intra-abdominally. Will defer further abx therapy to primary service.  Dispo - Continue sips of clears and await bowel function. Continue PT and encourage more OOB/mobilization. Receiving supplemental potassium/magnesium per primary team.   LOS: 4 days    Franne Forts , [facility] 04/21/2018, 11:44 AM Pager: [phone] Consults: [phone] Mon-Fri 7:00 am-4:30  pm Sat-Sun 7:00 am-11:30 am

## 2018-04-22 ENCOUNTER — Inpatient Hospital Stay (HOSPITAL_COMMUNITY): Payer: Medicare HMO

## 2018-04-22 DIAGNOSIS — K567 Ileus, unspecified: Secondary | ICD-10-CM

## 2018-04-22 LAB — CULTURE, BLOOD (ROUTINE X 2)
CULTURE: NO GROWTH
Culture: NO GROWTH
SPECIAL REQUESTS: ADEQUATE
Special Requests: ADEQUATE

## 2018-04-22 LAB — BASIC METABOLIC PANEL
Anion gap: 9 (ref 5–15)
CHLORIDE: 107 mmol/L (ref 101–111)
CO2: 26 mmol/L (ref 22–32)
Calcium: 8.4 mg/dL — ABNORMAL LOW (ref 8.9–10.3)
Creatinine, Ser: 0.8 mg/dL (ref 0.61–1.24)
Glucose, Bld: 116 mg/dL — ABNORMAL HIGH (ref 65–99)
POTASSIUM: 3.3 mmol/L — AB (ref 3.5–5.1)
Sodium: 142 mmol/L (ref 135–145)

## 2018-04-22 LAB — GLUCOSE, CAPILLARY
Glucose-Capillary: 111 mg/dL — ABNORMAL HIGH (ref 65–99)
Glucose-Capillary: 111 mg/dL — ABNORMAL HIGH (ref 65–99)
Glucose-Capillary: 115 mg/dL — ABNORMAL HIGH (ref 65–99)
Glucose-Capillary: 128 mg/dL — ABNORMAL HIGH (ref 65–99)
Glucose-Capillary: 146 mg/dL — ABNORMAL HIGH (ref 65–99)
Glucose-Capillary: 95 mg/dL (ref 65–99)

## 2018-04-22 LAB — CBC
HCT: 38.2 % — ABNORMAL LOW (ref 39.0–52.0)
HEMOGLOBIN: 12.9 g/dL — AB (ref 13.0–17.0)
MCH: 30.9 pg (ref 26.0–34.0)
MCHC: 33.8 g/dL (ref 30.0–36.0)
MCV: 91.4 fL (ref 78.0–100.0)
PLATELETS: 187 10*3/uL (ref 150–400)
RBC: 4.18 MIL/uL — AB (ref 4.22–5.81)
RDW: 13.2 % (ref 11.5–15.5)
WBC: 9.5 10*3/uL (ref 4.0–10.5)

## 2018-04-22 LAB — MAGNESIUM: Magnesium: 1.8 mg/dL (ref 1.7–2.4)

## 2018-04-22 MED ORDER — POTASSIUM CHLORIDE 10 MEQ/100ML IV SOLN
10.0000 meq | INTRAVENOUS | Status: AC
  Administered 2018-04-22 (×2): 10 meq via INTRAVENOUS
  Filled 2018-04-22 (×2): qty 100

## 2018-04-22 MED ORDER — AMANTADINE HCL 100 MG PO CAPS
200.0000 mg | ORAL_CAPSULE | Freq: Two times a day (BID) | ORAL | Status: DC
  Administered 2018-04-22 – 2018-04-26 (×9): 200 mg via ORAL
  Filled 2018-04-22 (×10): qty 2

## 2018-04-22 NOTE — Progress Notes (Signed)
5 Days Post-Op   Subjective/Chief Complaint: Confused Denies abdominal pain   Objective: Vital signs in last 24 hours: Temp:  [97.8 F (36.6 C)-98.8 F (37.1 C)] 98.2 F (36.8 C) (05/19 0800) BP: (134)/(89) 134/89 (05/19 0530) SpO2:  [100 %] 100 % (05/19 0530) Weight:  [76.4 kg (168 lb 6.9 oz)] 76.4 kg (168 lb 6.9 oz) (05/19 0323) Last BM Date: (PTA)  Intake/Output from previous day: 05/18 0701 - 05/19 0700 In: 950 [I.V.:800; IV Piggyback:150] Out: 3375 [Urine:3375] Intake/Output this shift: Total I/O In: -  Out: 1000 [Urine:1000]  Exam: Confused Abdomen distended, tympanitic, non-tender Incision clean Lab Results:  Recent Labs    04/21/18 0322 04/22/18 0416  WBC 6.8 9.5  HGB 12.0* 12.9*  HCT 35.6* 38.2*  PLT 169 187   BMET Recent Labs    04/21/18 0322 04/22/18 0416  NA 142 142  K 3.1* 3.3*  CL 107 107  CO2 28 26  GLUCOSE 133* 116*  BUN <5* <5*  CREATININE 0.81 0.80  CALCIUM 8.1* 8.4*   PT/INR No results for input(s): LABPROT, INR in the last 72 hours. ABG No results for input(s): PHART, HCO3 in the last 72 hours.  Invalid input(s): PCO2, PO2  Studies/Results: No results found.  Anti-infectives: Anti-infectives (From admission, onward)   Start     Dose/Rate Route Frequency Ordered Stop   04/17/18 1330  piperacillin-tazobactam (ZOSYN) IVPB 3.375 g     3.375 g 12.5 mL/hr over 240 Minutes Intravenous Every 8 hours 04/17/18 0815     04/17/18 1200  piperacillin-tazobactam (ZOSYN) IVPB 3.375 g  Status:  Discontinued     3.375 g 12.5 mL/hr over 240 Minutes Intravenous Every 8 hours 04/17/18 0534 04/17/18 0815   04/17/18 0815  piperacillin-tazobactam (ZOSYN) IVPB 3.375 g  Status:  Discontinued     3.375 g 100 mL/hr over 30 Minutes Intravenous Every 8 hours 04/17/18 0801 04/17/18 0814   04/17/18 0515  piperacillin-tazobactam (ZOSYN) IVPB 3.375 g     3.375 g 100 mL/hr over 30 Minutes Intravenous  Once 04/17/18 0505 04/17/18 9604       Assessment/Plan: s/p Procedure(s): LAPAROSCOPY DIAGNOSTIC (N/A) EXPLORATORY LAPAROTOMY (N/A)  Ileus  Continue sips Increase activity when possible  LOS: 5 days    Voris Tigert A 04/22/2018

## 2018-04-22 NOTE — Progress Notes (Signed)
PROGRESS NOTE    Matthew Meza  ZOX:[id number] DOB: July 10, 1941 DOA: 04/17/2018 PCP: Blair Heys, MD   Brief Narrative:   77 year old with previous history of CVA post chronic left-sided deficit, Parkinson's disease, diabetes mellitus type 2-insulin-dependent, dementia, hyperlipidemia came to the hospital after having coffee-ground emesis and was noted to have portal vein gas.  In the ED he appeared to be septic with elevated WBC and lactate.  CT of the abdomen pelvis showed portal vein gas but no other acute process.  He was started on IV-, antibiotics and NG tube was placed.  General surgery took the patient to the OR for expiratory laparotomy on 5/14.  Since then patient has been slowly improving as her WBC has trended down so related.  His bowel sounds remain hypoactive.  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   - He is feeling quite nauseous this morning with diminished bowel sounds and distended abdomen.  Will check abdominal x-ray, patient refuses to have NG tube placed again -Continue aggressive repletion of magnesium and potassium.  IV fluids -Continue IV Zosyn -Out of bed to chair, increase activity -General surgery following - Provide supportive care  Hypokalemia -Replete electrolytes PRN  Diabetes mellitus type 2 -Accu-Cheks and sliding scale  History of Parkinson's with some  behavioral disturbances -Continue amantadine  Previous history of CVA with left-sided deficit -Holding Plavix.  Will restart when okay with surgery -Continue baclofen  DVT prophylaxis: SCDs Code Status: Full code Family Communication: None at bedside Disposition Plan: Maintain inpatient stay  Consultants:   General surgery  Procedures:   Exploratory laparotomy 5/14  Antimicrobials:   IV Zosyn   Subjective: Patient is awake this morning states he was up all night because he felt very nauseous and kept having hiccups.  He is not able to eat or drink much, his abdomen appears little bit more distended therefore I offered him NG tube placement but patient refuses to have it placed despite of me explaining how it will help decompress his abdomen.  Review of Systems Otherwise negative except as per HPI, including: HEENT/EYES = negative for pain, redness, loss of vision, double vision, blurred vision, loss of hearing, sore throat, hoarseness, dysphagia Cardiovascular= negative for chest pain, palpitation, murmurs, lower extremity swelling Respiratory/lungs= negative for shortness of breath, cough, hemoptysis, wheezing, mucus production Gastrointestinal= negative for vomiting, melena, hematemesis Genitourinary= negative for Dysuria, Hematuria, Change in Urinary Frequency MSK = Negative for arthralgia, myalgias, Back Pain, Joint swelling  Neurology= Negative for headache, seizures, numbness, tingling  Psychiatry= Negative for anxiety, depression, suicidal and homocidal ideation Allergy/Immunology= Medication/Food allergy as listed  Skin= Negative for Rash, lesions, ulcers, itching   Objective: Vitals:   04/22/18 0323 04/22/18 0504 04/22/18 0530 04/22/18 0800  BP:   134/89   Pulse:      Resp:      Temp:  98.5 F (36.9 C)  98.2 F (36.8  C)  TempSrc:  Oral  Oral  SpO2:   100%   Weight: 76.4 kg (168 lb 6.9 oz)     Height:        Intake/Output Summary (Last 24 hours) at  04/22/2018 0950 Last data filed at 04/22/2018 0829 Gross per 24 hour  Intake 950 ml  Output 4375 ml  Net -3425 ml   Filed Weights   04/20/18 0531 04/21/18 0300 04/22/18 0323  Weight: 76 kg (167 lb 8.8 oz) 75.5 kg (166 lb 7.2 oz) 76.4 kg (168 lb 6.9 oz)    Examination: Constitutional: Mild distress due to feeling of nausea, elderly frail-appearing Eyes: PERRL, lids and conjunctivae normal ENMT: Mucous membranes are moist. Posterior pharynx clear of any exudate or lesions.Normal dentition.  Neck: normal, supple, no masses, no thyromegaly Respiratory: clear to auscultation bilaterally, no wheezing, no crackles. Normal respiratory effort. No accessory muscle use.  Cardiovascular: Regular rate and rhythm, no murmurs / rubs / gallops. No extremity edema. 2+ pedal pulses. No carotid bruits.  Abdomen: Hypoactive bowel sounds, abdomen is distended Musculoskeletal: no clubbing / cyanosis. No joint deformity upper and lower extremities. Good ROM, no contractures. Normal muscle tone.  Skin: no rashes, lesions, ulcers. No induration Neurologic: CN 2-12 grossly intact. Sensation intact, DTR normal. Strength 5/5 in all 4.  Psychiatric: Poor judgment and insight. Alert and oriented x 2. Normal mood.    Data Reviewed:   CBC: Recent Labs  Lab 04/17/18 0423  04/18/18 0213 04/19/18 0438 04/20/18 0338 04/21/18 0322 04/22/18 0416  WBC 27.8*   < > 13.7* 10.7* 7.0 6.8 9.5  NEUTROABS 25.0*  --   --   --   --   --   --   HGB 19.2*   < > 12.2* 11.5* 12.0* 12.0* 12.9*  HCT 56.2*   < > 37.9* 35.3* 36.1* 35.6* 38.2*  MCV 95.7   < > 95.0 94.1 91.6 91.8 91.4  PLT 241   < > 148* 135* 144* 169 187   < > = values in this interval not displayed.   Basic Metabolic Panel: Recent Labs  Lab 04/18/18 0213 04/19/18 0438 04/20/18 0338 04/21/18 0322 04/22/18 0416  NA 141 142 143 142 142  K 4.0 3.2* 2.9* 3.1* 3.3*  CL 111 107 108 107 107  CO2 GLUCOSE 142* 94 136* 133* 116*  BUN 10 7 <5*  <5* <5*  CREATININE 0.84 0.79 0.84 0.81 0.80  CALCIUM 7.7* 8.1* 8.3* 8.1* 8.4*  MG  --   --  1.6*  --  1.8   GFR: Estimated Creatinine Clearance: 84.9 mL/min (by C-G formula based on SCr of 0.8 mg/dL). Liver Function Tests: Recent Labs  Lab 04/17/18 2305 04/18/18 0213 04/19/18 0438 04/20/18 0338 04/21/18 0322  AST ALT 36 36 ALKPHOS 63 67 61 65 60  BILITOT 0.9 1.2 1.5* 1.2 1.2  PROT 4.6* 4.9* 5.0* 5.2* 5.2*  ALBUMIN 2.8* 2.9* 2.7* 2.8* 2.7*   Recent Labs  Lab 04/17/18 0423  LIPASE 25   No results for input(s): AMMONIA in the last 168 hours. Coagulation Profile: No results for input(s): INR, PROTIME in the last 168 hours. Cardiac Enzymes: No results for input(s): CKTOTAL, CKMB, CKMBINDEX, TROPONINI in the last 168 hours. BNP (last 3 results) No results for input(s): PROBNP in the last 8760 hours. HbA1C: No results for input(s): HGBA1C in the last 72 hours. CBG: Recent Labs  Lab  04/21/18 1810 04/21/18 2046 04/22/18 0018 04/22/18 0502 04/22/18 0758  GLUCAP 117* 144* 128* 95 111*   Lipid Profile: No results for input(s): CHOL, HDL, LDLCALC, TRIG, CHOLHDL, LDLDIRECT in the last 72 hours. Thyroid Function Tests: No results for input(s): TSH, T4TOTAL, FREET4, T3FREE, THYROIDAB in the last 72 hours. Anemia Panel: No results for input(s): VITAMINB12, FOLATE, FERRITIN, TIBC, IRON, RETICCTPCT in the last 72 hours. Sepsis Labs: Recent Labs  Lab 04/17/18 2122 04/18/18 0213 04/18/18 0905 04/18/18 1153  LATICACIDVEN 5.8* 2.3* 1.6 1.1    Recent Results (from the past 240 hour(s))  Blood Culture (routine x 2)     Status: None (Preliminary result)   Collection Time: 04/17/18  4:25 AM  Result Value Ref Range Status   Specimen Description BLOOD RIGHT WRIST  Final   Special Requests   Final    BOTTLES DRAWN AEROBIC AND ANAEROBIC Blood Culture adequate volume   Culture   Final    NO GROWTH 4 DAYS Performed at [facility], [address], [place], [place] [address]    Report Status PENDING  Incomplete  Blood Culture (routine x 2)     Status: None (Preliminary result)   Collection Time: 04/17/18  5:39 AM  Result Value Ref Range Status   Specimen Description BLOOD RIGHT HAND  Final   Special Requests   Final    BOTTLES DRAWN AEROBIC AND ANAEROBIC Blood Culture adequate volume   Culture   Final    NO GROWTH 4 DAYS Performed at [facility], [address] [place], [place] [address]    Report Status PENDING  Incomplete  MRSA PCR Screening     Status: None   Collection Time: 04/18/18  2:05 PM  Result Value Ref Range Status   MRSA by PCR NEGATIVE NEGATIVE Final    Comment:        The GeneXpert MRSA Assay (FDA approved for NASAL specimens only), is one component of a comprehensive MRSA colonization surveillance program. It is not intended to diagnose MRSA infection nor to guide or monitor treatment for MRSA infections. Performed at [facility], [address] [place], [place] [address]          Radiology Studies: Dg Abd Portable 1v  Result Date: 04/22/2018 CLINICAL DATA:  Nausea and vomiting EXAM: PORTABLE ABDOMEN - 1 VIEW COMPARISON:  Radiograph 04/18/2018, CT 04/17/2018 FINDINGS: NG tube not on the film. Newly dilates of small bowel centrally within the abdomen measure up to 4-5 cm. There is gas in the rectum. IMPRESSION: Newly dilated loops of small bowel consistent ileus or obstruction. NG tube appears to have been removed. Electronically Signed   By: Genevive Bi M.D.   On: 04/22/2018 09:34        Scheduled Meds: . amantadine  200 mg Per Tube BID  . chlorhexidine  15 mL Mouth Rinse BID  . enoxaparin (LOVENOX) injection  40 mg Subcutaneous Daily  . insulin aspart  0-9 Units Subcutaneous TID WC  . mouth rinse  15 mL Mouth Rinse q12n4p  . sodium chloride flush  10-40 mL Intracatheter Q12H   Continuous Infusions: . sodium chloride    . dextrose 5 % and 0.45 % NaCl with KCl 20  mEq/L 100 mL/hr at 04/22/18 0528  . famotidine (PEPCID) IV Stopped (04/21/18 2200)  . lactated ringers Stopped (04/19/18 0831)  . piperacillin-tazobactam (ZOSYN)  IV 3.375 g (04/22/18 0529)  . potassium chloride    . sodium chloride  LOS: 5 days    I have spent 35 minutes face to face with the patient and on the ward discussing the patients care, assessment, plan and disposition with other care givers. >50% of the time was devoted counseling the patient about the risks and benefits of treatment and coordinating care.     Bluma Buresh Joline Maxcy, MD [facility] Pager [phone]   If 7PM-7AM, please contact night-coverage [email or web address] Password [email or web address] 04/22/2018, 9:50 AM

## 2018-04-23 ENCOUNTER — Inpatient Hospital Stay (HOSPITAL_COMMUNITY): Payer: Medicare HMO

## 2018-04-23 DIAGNOSIS — Z7189 Other specified counseling: Secondary | ICD-10-CM

## 2018-04-23 LAB — PHOSPHORUS: Phosphorus: 2.6 mg/dL (ref 2.5–4.6)

## 2018-04-23 LAB — MAGNESIUM: MAGNESIUM: 1.7 mg/dL (ref 1.7–2.4)

## 2018-04-23 LAB — CBC
HEMATOCRIT: 41.2 % (ref 39.0–52.0)
HEMOGLOBIN: 13.8 g/dL (ref 13.0–17.0)
MCH: 31.3 pg (ref 26.0–34.0)
MCHC: 33.5 g/dL (ref 30.0–36.0)
MCV: 93.4 fL (ref 78.0–100.0)
Platelets: 217 10*3/uL (ref 150–400)
RBC: 4.41 MIL/uL (ref 4.22–5.81)
RDW: 13.3 % (ref 11.5–15.5)
WBC: 11 10*3/uL — ABNORMAL HIGH (ref 4.0–10.5)

## 2018-04-23 LAB — BASIC METABOLIC PANEL
Anion gap: 10 (ref 5–15)
BUN: <5 mg/dL — ABNORMAL LOW (ref 6–20)
CHLORIDE: 105 mmol/L (ref 101–111)
CO2: 26 mmol/L (ref 22–32)
Calcium: 8.7 mg/dL — ABNORMAL LOW (ref 8.9–10.3)
Creatinine, Ser: 0.9 mg/dL (ref 0.61–1.24)
GFR calc non Af Amer: >60 mL/min (ref >60)
Glucose, Bld: 140 mg/dL — ABNORMAL HIGH (ref 65–99)
POTASSIUM: 3.7 mmol/L (ref 3.5–5.1)
SODIUM: 141 mmol/L (ref 135–145)

## 2018-04-23 LAB — GLUCOSE, CAPILLARY
Glucose-Capillary: 135 mg/dL — ABNORMAL HIGH (ref 65–99)
Glucose-Capillary: 135 mg/dL — ABNORMAL HIGH (ref 65–99)
Glucose-Capillary: 139 mg/dL — ABNORMAL HIGH (ref 65–99)
Glucose-Capillary: 140 mg/dL — ABNORMAL HIGH (ref 65–99)
Glucose-Capillary: 149 mg/dL — ABNORMAL HIGH (ref 65–99)
Glucose-Capillary: 159 mg/dL — ABNORMAL HIGH (ref 65–99)
Glucose-Capillary: 97 mg/dL (ref 65–99)

## 2018-04-23 LAB — HEPATIC FUNCTION PANEL
ALT: 36 U/L (ref 17–63)
AST: 21 U/L (ref 15–41)
Albumin: 2.8 g/dL — ABNORMAL LOW (ref 3.5–5.0)
Alkaline Phosphatase: 75 U/L (ref 38–126)
BILIRUBIN INDIRECT: 0.8 mg/dL (ref 0.3–0.9)
Bilirubin, Direct: 0.5 mg/dL (ref 0.1–0.5)
TOTAL PROTEIN: 6 g/dL — AB (ref 6.5–8.1)
Total Bilirubin: 1.3 mg/dL — ABNORMAL HIGH (ref 0.3–1.2)

## 2018-04-23 LAB — PREALBUMIN: PREALBUMIN: 16.1 mg/dL — AB (ref 18–38)

## 2018-04-23 MED ORDER — INSULIN ASPART 100 UNIT/ML ~~LOC~~ SOLN
0.0000 [IU] | SUBCUTANEOUS | Status: DC
  Administered 2018-04-23: 3 [IU] via SUBCUTANEOUS
  Administered 2018-04-23 – 2018-04-24 (×3): 2 [IU] via SUBCUTANEOUS
  Administered 2018-04-24: 3 [IU] via SUBCUTANEOUS
  Administered 2018-04-24: 2 [IU] via SUBCUTANEOUS
  Administered 2018-04-24: 3 [IU] via SUBCUTANEOUS
  Administered 2018-04-25 (×5): 2 [IU] via SUBCUTANEOUS
  Administered 2018-04-26: 3 [IU] via SUBCUTANEOUS
  Administered 2018-04-26 – 2018-04-27 (×6): 2 [IU] via SUBCUTANEOUS
  Administered 2018-04-28 (×2): 3 [IU] via SUBCUTANEOUS
  Administered 2018-04-28 – 2018-04-30 (×9): 2 [IU] via SUBCUTANEOUS

## 2018-04-23 MED ORDER — MAGNESIUM SULFATE 2 GM/50ML IV SOLN
2.0000 g | Freq: Once | INTRAVENOUS | Status: AC
  Administered 2018-04-23: 2 g via INTRAVENOUS
  Filled 2018-04-23 (×3): qty 50

## 2018-04-23 MED ORDER — POTASSIUM ACETATE 2 MEQ/ML IV SOLN
INTRAVENOUS | Status: DC
Start: 2018-04-23 — End: 2018-04-24
  Filled 2018-04-23: qty 537.6

## 2018-04-23 MED ORDER — POTASSIUM CHLORIDE 2 MEQ/ML IV SOLN
INTRAVENOUS | Status: DC
  Filled 2018-04-23 (×2): qty 1000

## 2018-04-23 MED ORDER — DEXTROSE-NACL 5-0.45 % IV SOLN
INTRAVENOUS | Status: AC
  Administered 2018-04-23: 22:00:00 via INTRAVENOUS

## 2018-04-23 MED ORDER — POTASSIUM CHLORIDE 10 MEQ/100ML IV SOLN
10.0000 meq | INTRAVENOUS | Status: AC
  Administered 2018-04-23 (×3): 10 meq via INTRAVENOUS
  Filled 2018-04-23 (×3): qty 100

## 2018-04-23 NOTE — Progress Notes (Signed)
6 Days Post-Op    CC: nausea and vomiting  Subjective: Has the hiccups this AM, Mittens are off, he has a central line in his neck.  He knows he is at [facility], thinks it's for his stroke, Left hemiparesis.  He has a stool recorded but no BS.  I took down the current waffle dressing and wound OK.    Objective: Vital signs in last 24 hours: Temp:  [97.8 F (36.6 C)-98.8 F (37.1 C)] 98.3 F (36.8 C) (05/20 0749) Pulse Rate:  [93-96] 96 (05/20 0450) Resp:  [15-17] 16 (05/20 0450) BP: (125-160)/(84-100) 125/84 (05/20 0450) SpO2:  [97 %-100 %] 97 % (05/20 0450) Weight:  [77.4 kg (170 lb 10.2 oz)] 77.4 kg (170 lb 10.2 oz) (05/20 0524) Last BM Date: (PTA pt does not recall) NPO 1251 IV 3400 urine Stool x 1 this AM Afebrile, VSS BP up last PM WBC still up some  11K  Intake/Output from previous day: 05/19 0701 - 05/20 0700 In: 1251.7 [I.V.:1101.7; IV Piggyback:150] Out: 3400 [Urine:3400] Intake/Output this shift: Total I/O In: -  Out: 1 [Stool:1]  General appearance: alert, cooperative and no distress Resp: clear to auscultation bilaterally and anterior GI: no BS, abdomen still distended, incision OK  Lab Results:  Recent Labs    04/22/18 0416 04/23/18 0430  WBC 9.5 11.0*  HGB 12.9* 13.8  HCT 38.2* 41.2  PLT 187 217    BMET Recent Labs    04/22/18 0416 04/23/18 0430  NA 142 141  K 3.3* 3.7  CL 107 105  CO2 26 26  GLUCOSE 116* 140*  BUN <5* <5*  CREATININE 0.80 0.90  CALCIUM 8.4* 8.7*   PT/INR No results for input(s): LABPROT, INR in the last 72 hours.  Recent Labs  Lab 04/17/18 2305 04/18/18 0213 04/19/18 0438 04/20/18 0338 04/21/18 0322  AST ALT 36 36 ALKPHOS 63 67 61 65 60  BILITOT 0.9 1.2 1.5* 1.2 1.2  PROT 4.6* 4.9* 5.0* 5.2* 5.2*  ALBUMIN 2.8* 2.9* 2.7* 2.8* 2.7*     Lipase     Component Value Date/Time   LIPASE 25 04/17/2018 0423     Medications: . amantadine  200 mg Oral BID  . chlorhexidine  15 mL Mouth  Rinse BID  . enoxaparin (LOVENOX) injection  40 mg Subcutaneous Daily  . insulin aspart  0-9 Units Subcutaneous TID WC  . mouth rinse  15 mL Mouth Rinse q12n4p  . sodium chloride flush  10-40 mL Intracatheter Q12H   . sodium chloride    . dextrose 5 % and 0.45 % NaCl with KCl 20 mEq/L 100 mL/hr at 04/23/18 0320  . famotidine (PEPCID) IV Stopped (04/23/18 0929)  . lactated ringers Stopped (04/19/18 0831)  . magnesium sulfate 1 - 4 g bolus IVPB    . piperacillin-tazobactam (ZOSYN)  IV Stopped (04/23/18 0929)  . potassium chloride 10 mEq (04/23/18 1037)  . sodium chloride     Anti-infectives (From admission, onward)   Start     Dose/Rate Route Frequency Ordered Stop   04/17/18 1330  piperacillin-tazobactam (ZOSYN) IVPB 3.375 g     3.375 g 12.5 mL/hr over 240 Minutes Intravenous Every 8 hours 04/17/18 0815     04/17/18 1200  piperacillin-tazobactam (ZOSYN) IVPB 3.375 g  Status:  Discontinued     3.375 g 12.5 mL/hr over 240 Minutes Intravenous Every 8 hours 04/17/18 0534 04/17/18 0815   04/17/18 0815  piperacillin-tazobactam (ZOSYN) IVPB  3.375 g  Status:  Discontinued     3.375 g 100 mL/hr over 30 Minutes Intravenous Every 8 hours 04/17/18 0801 04/17/18 0814   04/17/18 0515  piperacillin-tazobactam (ZOSYN) IVPB 3.375 g     3.375 g 100 mL/hr over 30 Minutes Intravenous  Once 04/17/18 0505 04/17/18 2956     Assessment/Plan  DM H/o CVA with residual left sided weakness Dementia/Parkinson HTN HLD GERD  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 Donell Beers, no definitive findings to explain lactic acidosis, WBC, and portal venous gas. He did have some hyperemic appears bowel with dilatation, but no definite malrotation at the time or surgery or compromised bowel. -POD 6 BM reported pt can't tell me much about ti  FEN -NPO, but may have clears from the floor - ADD TNA till he opens up.  He has a central line in place. VTE -Lovenox/SCDs/ can  resume plavix from our standpoint ID -[email or web address]; 5/14 =>> day 7  Plan:  Add TNA and await return of bowel function.  ADD LFT's and Prealbumin to AM labs.  PT following and he is for SNF.     LOS: 6 days    Tanieka Pownall 04/23/2018 [phone]

## 2018-04-23 NOTE — Progress Notes (Signed)
PHARMACY - ADULT TOTAL PARENTERAL NUTRITION CONSULT NOTE   Pharmacy Consult for TPN Indication: prolonged ileus  Patient Measurements: Height: 6' (182.9 cm) Weight: 170 lb 10.2 oz (77.4 kg) IBW/kg (Calculated) : 77.6 TPN AdjBW (KG): 86.2 Body mass index is 23.14 kg/m.  Assessment: 5 yom presenting 5/14 with N/V and portal venous gas with possible malrotation of small bowel, s/p ex lap 5/14 with no definitive findings. Pharmacy consulted to start TPN for prolonged ileus. Awaiting return of bowel function - per Surgery note, BM documented 5/20 but no BS. Weight appears stable from previous admissions.  PMH: DM2, CVA, dementia/Parkinson's, HTN, HLD, GERD  GI: NGT out - patient refusing to have replaced, still with nausea, distended abdomen, diminished bowel sounds. 1 stool documented 5/20 but no BS. Pepcid IV, Zofran PRN Endo: hx DM. CBGs controlled off TPN Insulin requirements in the past 24 hours: 1u sensitive TID w/ meals SSI in last 24hrs Lytes: K up to 3.7 (s/p K runs x 3 already this AM, 4 K runs on 5/19); Mag 1.7 (s/p mag sulfate 2g IV x 1 already today); others WNL Renal: SCr stable 0.9. UOP 1.8 cc/kg/hr. D51/2NS+20K at 100 ml/hr Pulm: RA Cards: BP wnl/slightly high Hepatobil: LFTs / Tbili WNL, TG pending Neuro: amantadine, morphine PRN - pain score 0 ID: [email or web address] for IAI. Afebrile, WBC up to 11. BCx negative  TPN Access: CVC double lumen placed 04/17/18 TPN start date: 04/23/18 Nutritional Goals (per RD recommendation on 5/15): KCal: 1900 - 2076 Protein: 103-120 g Fluid: >2L  Goal TPN rate is 75 ml/hr  Current Nutrition:  NPO  Plan:  Initiate TPN at 70mL/hr. This TPN provides 54 g of protein, 118 g of dextrose, and 23 g of lipids which provides 842 kCals per day, meeting 52% of patient protein needs and 44% of kCal needs Electrolytes in TPN: standard lytes; Cl:Ac 1:1 Add MVI, trace elements to TPN Change SSI to moderate q4h SSI and adjust as needed Reduce IVMF  (D51/2NS+20K) to 60 ml/hr at 1800 when TPN starts Monitor TPN labs, Surgery plans F/U phos this afternoon and replace as needed  Babs Bertin, PharmD, BCPS Clinical Pharmacist Clinical phone for 04/23/2018 until 3:30pm: [phone] If after 3:30pm, please call main pharmacy at: [phone] 04/23/2018 12:07 PM

## 2018-04-23 NOTE — Progress Notes (Signed)
PROGRESS NOTE    Matthew Meza  WUJ:[id number] DOB: 15-Nov-1941 DOA: 04/17/2018 PCP: Blair Heys, MD   Brief Narrative:   77 year old with previous history of CVA post chronic left-sided deficit, Parkinson's disease, diabetes mellitus type 2-insulin-dependent, dementia, hyperlipidemia came to the hospital after having coffee-ground emesis and was noted to have portal vein gas.  In the ED he appeared to be septic with elevated WBC and lactate.  CT of the abdomen pelvis showed portal vein gas but no other acute process.  He was started on IV-, antibiotics and NG tube was placed.  General surgery took the patient to the OR for expiratory laparotomy on 5/14.  Since then patient has been slowly improving as her WBC has trended down so related.  His bowel sounds remain hypoactive.  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  Concerns of sepsis from intra-abdominal infection - Postop day 6 from Exp laparotomy.   -Patient continues to feel nauseous.  Abdominal x-ray shows worsening of small bowel dilatation.  He refuses to have NG tube placed. -Aggressively replete electrolytes, IV fluids as needed -Continue IV Zosyn -Out of bed to chair, increase activity -General surgery following-plans for TNA per surgery. - Provide supportive care -Given patient's overall condition and advanced dementia-not sure how much improvement will he may.  I will consult palliative care team.  Spoke with  the patient's daughter this morning who is agreeable to this.   Hypokalemia -Replete as necessary  Diabetes mellitus type 2 -Continue Accu-Cheks and sliding scale  History of Parkinson's with some behavioral disturbances -Continue amantadine  Previous history of CVA with left-sided deficit -Holding Plavix.  Will restart when okay with surgery -Continue baclofen  DVT prophylaxis: SCDs Code Status: Full code Family Communication: None at bedside. Spoke his daughter Valentino Saxon over the phone. She is very open to speaking with Palliative care therefore will place a consult.  Disposition Plan: Maintain inpatient stay  Consultants:   General surgery  Procedures:   Exploratory laparotomy 5/14  Antimicrobials:   IV Zosyn   Subjective: Patient continues to have hiccups this morning and his abdomen is still distended.  He refused to have NG tube placed.  Barely tolerating medications with sips  Review of Systems Otherwise negative except as per HPI, including: HEENT/EYES = negative for pain, redness, loss of vision, double vision, blurred vision, loss of hearing, sore throat, hoarseness, dysphagia Cardiovascular= negative for chest pain, palpitation, murmurs, lower extremity swelling Respiratory/lungs= negative for shortness of breath, cough, hemoptysis, wheezing, mucus production Gastrointestinal= negative for nausea, vomiting,, abdominal pain, melena, hematemesis Genitourinary= negative for Dysuria, Hematuria, Change in Urinary Frequency MSK = Negative for arthralgia, myalgias, Back Pain, Joint swelling  Neurology= Negative for headache, seizures, numbness, tingling  Psychiatry= Negative for anxiety, depression, suicidal and homocidal ideation Allergy/Immunology= Medication/Food allergy as listed  Skin= Negative for Rash, lesions, ulcers, itching    Objective: Vitals:   04/23/18 0522 04/23/18 0524 04/23/18 0749 04/23/18 0849  BP:    Marland Kitchen)  144/85  Pulse:    93  Resp:    19    Temp: 97.8 F (36.6 C)  98.3 F (36.8 C) 98.9 F (37.2 C)  TempSrc: Oral  Oral Oral  SpO2:    98%  Weight:  77.4 kg (170 lb 10.2 oz)    Height:        Intake/Output Summary (Last 24 hours) at 04/23/2018 1153 Last data filed at 04/23/2018 0915 Gross per 24 hour  Intake 1251.67 ml  Output 2401 ml  Net -1149.33 ml   Filed Weights   04/21/18 0300 04/22/18 0323 04/23/18 0524  Weight: 75.5 kg (166 lb 7.2 oz) 76.4 kg (168 lb 6.9 oz) 77.4 kg (170 lb 10.2 oz)    Examination: Constitutional: mild distress due to abd discomfort.  Eyes: PERRL, lids and conjunctivae normal ENMT: Mucous membranes are Dry. Posterior pharynx clear of any exudate or lesions.Normal dentition.  Neck: normal, supple, no masses, no thyromegaly Respiratory: clear to auscultation bilaterally, no wheezing, no crackles. Normal respiratory effort. No accessory muscle use.  Cardiovascular: Regular rate and rhythm, no murmurs / rubs / gallops. No extremity edema. 2+ pedal pulses. No carotid bruits.  Abdomen: +distended +tenderness to deep palpation.  Musculoskeletal: no clubbing / cyanosis. No joint deformity upper and lower extremities. Good ROM, no contractures. Normal muscle tone.  Skin: no rashes, lesions, ulcers. No induration Neurologic: CN 2-12 grossly intact. Sensation intact, DTR normal. Strength 5/5 in all 4.  Psychiatric: Normal judgment and insight. Alert and oriented x 1-2. Normal mood.   Data Reviewed:   CBC: Recent Labs  Lab 04/17/18 0423  04/19/18 0438 04/20/18 0338 04/21/18 0322 04/22/18 0416 04/23/18 0430  WBC 27.8*   < > 10.7* 7.0 6.8 9.5 11.0*  NEUTROABS 25.0*  --   --   --   --   --   --   HGB 19.2*   < > 11.5* 12.0* 12.0* 12.9* 13.8  HCT 56.2*   < > 35.3* 36.1* 35.6* 38.2* 41.2  MCV 95.7   < > 94.1 91.6 91.8 91.4 93.4  PLT 241   < > 135* 144* 169 187 217   < > = values in this interval not displayed.   Basic Metabolic Panel: Recent Labs  Lab 04/19/18 0438 04/20/18 0338  04/21/18 0322 04/22/18 0416 04/23/18 0430  NA 142 143 142 142 141  K 3.2* 2.9* 3.1* 3.3* 3.7  CL 107 108 107 107 105  CO2 GLUCOSE 94 136* 133* 116* 140*  BUN 7 <5* <5* <5* <5*  CREATININE 0.79 0.84 0.81 0.80 0.90  CALCIUM 8.1* 8.3* 8.1* 8.4* 8.7*  MG  --  1.6*  --  1.8 1.7   GFR: Estimated Creatinine Clearance: 76.4 mL/min (by C-G formula based on SCr of 0.9 mg/dL). Liver Function Tests: Recent Labs  Lab 04/17/18 2305 04/18/18 0213 04/19/18 0438 04/20/18 0338 04/21/18 0322  AST ALT 36 36 ALKPHOS 63 67 61 65 60  BILITOT 0.9 1.2 1.5* 1.2 1.2  PROT 4.6* 4.9* 5.0* 5.2* 5.2*  ALBUMIN 2.8* 2.9* 2.7* 2.8* 2.7*   Recent Labs  Lab 04/17/18 0423  LIPASE 25   No results for input(s): AMMONIA in the last 168 hours. Coagulation Profile: No results for input(s): INR, PROTIME in the last 168 hours. Cardiac Enzymes: No results for input(s): CKTOTAL, CKMB, CKMBINDEX, TROPONINI in the last 168 hours. BNP (last 3 results) No results for  input(s): PROBNP in the last 8760 hours. HbA1C: No results for input(s): HGBA1C in the last 72 hours. CBG: Recent Labs  Lab 04/22/18 2019 04/23/18 0027 04/23/18 0523 04/23/18 0747 04/23/18 1133  GLUCAP 146* 135* 149* 139* 97   Lipid Profile: No results for input(s): CHOL, HDL, LDLCALC, TRIG, CHOLHDL, LDLDIRECT in the last 72 hours. Thyroid Function Tests: No results for input(s): TSH, T4TOTAL, FREET4, T3FREE, THYROIDAB in the last 72 hours. Anemia Panel: No results for input(s): VITAMINB12, FOLATE, FERRITIN, TIBC, IRON, RETICCTPCT in the last 72 hours. Sepsis Labs: Recent Labs  Lab 04/17/18 2122 04/18/18 0213 04/18/18 0905 04/18/18 1153  LATICACIDVEN 5.8* 2.3* 1.6 1.1    Recent Results (from the past 240 hour(s))  Blood Culture (routine x 2)     Status: None   Collection Time: 04/17/18  4:25 AM  Result Value Ref Range Status   Specimen Description BLOOD RIGHT WRIST  Final   Special  Requests   Final    BOTTLES DRAWN AEROBIC AND ANAEROBIC Blood Culture adequate volume   Culture   Final    NO GROWTH 5 DAYS Performed at [facility], [address] [place], [place] [address]    Report Status 04/22/2018 FINAL  Final  Blood Culture (routine x 2)     Status: None   Collection Time: 04/17/18  5:39 AM  Result Value Ref Range Status   Specimen Description BLOOD RIGHT HAND  Final   Special Requests   Final    BOTTLES DRAWN AEROBIC AND ANAEROBIC Blood Culture adequate volume   Culture   Final    NO GROWTH 5 DAYS Performed at [facility], [address] [place], [place] [address]    Report Status 04/22/2018 FINAL  Final  MRSA PCR Screening     Status: None   Collection Time: 04/18/18  2:05 PM  Result Value Ref Range Status   MRSA by PCR NEGATIVE NEGATIVE Final    Comment:        The GeneXpert MRSA Assay (FDA approved for NASAL specimens only), is one component of a comprehensive MRSA colonization surveillance program. It is not intended to diagnose MRSA infection nor to guide or monitor treatment for MRSA infections. Performed at [facility], [address] [place], [place] [address]          Radiology Studies: Dg Abd Portable 1v  Result Date: 04/22/2018 CLINICAL DATA:  Nausea and vomiting EXAM: PORTABLE ABDOMEN - 1 VIEW COMPARISON:  Radiograph 04/18/2018, CT 04/17/2018 FINDINGS: NG tube not on the film. Newly dilates of small bowel centrally within the abdomen measure up to 4-5 cm. There is gas in the rectum. IMPRESSION: Newly dilated loops of small bowel consistent ileus or obstruction. NG tube appears to have been removed. Electronically Signed   By: Genevive Bi M.D.   On: 04/22/2018 09:34        Scheduled Meds: . amantadine  200 mg Oral BID  . chlorhexidine  15 mL Mouth Rinse BID  . enoxaparin (LOVENOX) injection  40 mg Subcutaneous Daily  . insulin aspart  0-9 Units Subcutaneous TID WC  . mouth rinse  15 mL Mouth Rinse  q12n4p  . sodium chloride flush  10-40 mL Intracatheter Q12H   Continuous Infusions: . sodium chloride    . dextrose 5 % and 0.45 % NaCl with KCl 20 mEq/L 100 mL/hr at 04/23/18 0320  . famotidine (PEPCID) IV Stopped (04/23/18 0929)  . lactated ringers Stopped (04/19/18 0831)  .  magnesium sulfate 1 - 4 g bolus IVPB 2 g (04/23/18 1143)  . piperacillin-tazobactam (ZOSYN)  IV Stopped (04/23/18 0929)  . sodium chloride       LOS: 6 days    I have spent 35 minutes face to face with the patient and on the ward discussing the patients care, assessment, plan and disposition with other care givers. >50% of the time was devoted counseling the patient about the risks and benefits of treatment and coordinating care.     Ankit Joline Maxcy, MD [facility] Pager [phone]   If 7PM-7AM, please contact night-coverage [email or web address] Password [facility] 04/23/2018, 11:53 AM

## 2018-04-23 NOTE — Progress Notes (Signed)
Nutrition Follow-up  DOCUMENTATION CODES:   Not applicable  INTERVENTION:  TPN per pharmacy  Recommend monitor K+, Phos, Mg for at least 3 days and replete as necessary. Patient is at risk for refeeding  NUTRITION DIAGNOSIS:   Inadequate oral intake related to inability to eat as evidenced by NPO status. -ongoing  GOAL:   Patient will meet greater than or equal to 90% of their needs -unmet  MONITOR:   Diet advancement, Weight trends, I & O's  REASON FOR ASSESSMENT:   Low Braden    ASSESSMENT:   ANTAEUS Meza is a 77 y.o. male with medical history significant of CVA with chronic left-sided deficits, Parkinson's disease, type 2 diabetes on insulin, dementia, hyperlipidemia who comes in with one episode of coffee-ground emesis and portal venous gas. 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  Matthew Meza was resting at bedside this AM. Refused NGT per surgery.  Now started on TPN. New weight obtained 5/16, has fluctuated between 166-173 pounds. 1L fluid positive, 3.4L UOP last 24 hrs Needs re-estimated  Monitor diet advancement  Labs reviewed Medications reviewed and include:  D5 1/2 NS 20K+ at 77mL/hr --> 245 calories  Diet Order:   Diet Order           Diet NPO time specified Except for: Sips with Meds, Ice Chips, Other (See Comments)  Diet effective now          EDUCATION NEEDS:   Not appropriate for education at this time  Skin:  Skin Assessment: Reviewed RN Assessment(closed incision to abdomen)  Last BM:  PTA, BM documented 5/20 but no BS per surgery.  Height:   Ht Readings from Last 1 Encounters:  04/17/18 6' (1.829 m)    Weight:   Wt Readings from Last 1 Encounters:  04/23/18 170 lb 10.2 oz (77.4 kg)    Ideal Body Weight:  80.9 kg  BMI:  Body mass index is 23.14 kg/m.  Estimated Nutritional Needs:   Kcal:  7829-5621 calories  Protein:  100-115 grams  (1.3-1.5g/kg)  Fluid:  >2L  Matthew Ano. Nira Visscher, MS, RD LDN Inpatient Clinical Dietitian Pager [phone]

## 2018-04-24 DIAGNOSIS — Z515 Encounter for palliative care: Secondary | ICD-10-CM

## 2018-04-24 DIAGNOSIS — K920 Hematemesis: Secondary | ICD-10-CM

## 2018-04-24 LAB — DIFFERENTIAL
ABS IMMATURE GRANULOCYTES: 0.1 10*3/uL (ref 0.0–0.1)
Basophils Absolute: 0 10*3/uL (ref 0.0–0.1)
Basophils Relative: 0 %
EOS ABS: 0 10*3/uL (ref 0.0–0.7)
Eosinophils Relative: 0 %
Immature Granulocytes: 1 %
LYMPHS ABS: 1.3 10*3/uL (ref 0.7–4.0)
LYMPHS PCT: 6 %
MONOS PCT: 6 %
Monocytes Absolute: 1.3 10*3/uL — ABNORMAL HIGH (ref 0.1–1.0)
NEUTROS ABS: 18.4 10*3/uL — AB (ref 1.7–7.7)
Neutrophils Relative %: 87 %

## 2018-04-24 LAB — COMPREHENSIVE METABOLIC PANEL
ALBUMIN: 2.9 g/dL — AB (ref 3.5–5.0)
ALK PHOS: 68 U/L (ref 38–126)
ALT: 36 U/L (ref 17–63)
ANION GAP: 9 (ref 5–15)
AST: 21 U/L (ref 15–41)
BUN: 6 mg/dL (ref 6–20)
CO2: 23 mmol/L (ref 22–32)
CREATININE: 0.88 mg/dL (ref 0.61–1.24)
Calcium: 8.7 mg/dL — ABNORMAL LOW (ref 8.9–10.3)
Chloride: 103 mmol/L (ref 101–111)
GFR calc Af Amer: >60 mL/min (ref >60)
GFR calc non Af Amer: >60 mL/min (ref >60)
GLUCOSE: 150 mg/dL — AB (ref 65–99)
Potassium: 4 mmol/L (ref 3.5–5.1)
Sodium: 135 mmol/L (ref 135–145)
TOTAL PROTEIN: 6.1 g/dL — AB (ref 6.5–8.1)
Total Bilirubin: 1.3 mg/dL — ABNORMAL HIGH (ref 0.3–1.2)

## 2018-04-24 LAB — GLUCOSE, CAPILLARY
Glucose-Capillary: 144 mg/dL — ABNORMAL HIGH (ref 65–99)
Glucose-Capillary: 156 mg/dL — ABNORMAL HIGH (ref 65–99)
Glucose-Capillary: 177 mg/dL — ABNORMAL HIGH (ref 65–99)
Glucose-Capillary: 147 mg/dL — ABNORMAL HIGH (ref 65–99)
Glucose-Capillary: 116 mg/dL — ABNORMAL HIGH (ref 65–99)
Glucose-Capillary: 113 mg/dL — ABNORMAL HIGH (ref 65–99)

## 2018-04-24 LAB — MAGNESIUM: Magnesium: 1.7 mg/dL (ref 1.7–2.4)

## 2018-04-24 LAB — TRIGLYCERIDES: TRIGLYCERIDES: 93 mg/dL (ref <150)

## 2018-04-24 LAB — CBC
HEMATOCRIT: 43.2 % (ref 39.0–52.0)
Hemoglobin: 14.5 g/dL (ref 13.0–17.0)
MCH: 30.7 pg (ref 26.0–34.0)
MCHC: 33.6 g/dL (ref 30.0–36.0)
MCV: 91.5 fL (ref 78.0–100.0)
Platelets: 262 10*3/uL (ref 150–400)
RBC: 4.72 MIL/uL (ref 4.22–5.81)
RDW: 13.3 % (ref 11.5–15.5)
WBC: 21.2 10*3/uL — ABNORMAL HIGH (ref 4.0–10.5)

## 2018-04-24 LAB — PREALBUMIN: Prealbumin: 14.8 mg/dL — ABNORMAL LOW (ref 18–38)

## 2018-04-24 LAB — PHOSPHORUS: Phosphorus: 2.4 mg/dL — ABNORMAL LOW (ref 2.5–4.6)

## 2018-04-24 MED ORDER — POTASSIUM PHOSPHATES 15 MMOLE/5ML IV SOLN
20.0000 mmol | Freq: Once | INTRAVENOUS | Status: AC
  Administered 2018-04-24: 20 mmol via INTRAVENOUS
  Filled 2018-04-24: qty 6.67

## 2018-04-24 MED ORDER — TRACE MINERALS CR-CU-MN-SE-ZN 10-1000-500-60 MCG/ML IV SOLN
INTRAVENOUS | Status: DC
  Filled 2018-04-24: qty 537.6

## 2018-04-24 MED ORDER — IOPAMIDOL (ISOVUE-300) INJECTION 61%
INTRAVENOUS | Status: AC
  Filled 2018-04-24: qty 30

## 2018-04-24 MED ORDER — FAMOTIDINE IN NACL 20-0.9 MG/50ML-% IV SOLN
20.0000 mg | Freq: Two times a day (BID) | INTRAVENOUS | Status: DC
  Administered 2018-04-24 – 2018-04-29 (×10): 20 mg via INTRAVENOUS
  Filled 2018-04-24 (×10): qty 50

## 2018-04-24 MED ORDER — KCL IN DEXTROSE-NACL 20-5-0.45 MEQ/L-%-% IV SOLN
INTRAVENOUS | Status: DC
  Administered 2018-04-24: 05:00:00 via INTRAVENOUS
  Filled 2018-04-24 (×2): qty 1000

## 2018-04-24 MED ORDER — POTASSIUM CHLORIDE 2 MEQ/ML IV SOLN
INTRAVENOUS | Status: DC
  Filled 2018-04-24 (×2): qty 1000

## 2018-04-24 MED ORDER — KCL IN DEXTROSE-NACL 20-5-0.45 MEQ/L-%-% IV SOLN
INTRAVENOUS | Status: DC
  Administered 2018-04-24 – 2018-04-25 (×3): via INTRAVENOUS
  Administered 2018-04-27: 1000 mL via INTRAVENOUS
  Administered 2018-04-28: 02:00:00 via INTRAVENOUS
  Filled 2018-04-24 (×6): qty 1000

## 2018-04-24 NOTE — Consult Note (Signed)
Consultation Note Date: 04/24/2018   Patient Name: Matthew Meza  DOB: Nov 22, 1941  MRN: [id number]  Age / Sex: 77 y.o., male  PCP: Blair Heys, MD Referring Physician: Dimple Nanas, MD  Reason for Consultation: Establishing goals of care and Psychosocial/spiritual support  HPI/Patient Profile: 77 y.o. male  with past medical history of parkinsons disease and dementia, previous CVA with dense left hemiparesis, DM who was admitted on 04/17/2018 with abdominal pain with coffee ground emesis.  Imaging revealed volvulus and partial small bowel obstruction. Lactic acid and WBC were extremely elevated and small bowel ischemia was suspected.  Dr. Donell Beers performed and exploratory laproscopic exam in order to rule out necrotic bowel.  The ex lap was unrevealing.  The patient's recovery has been slow.  He is on TPN.  Today (5/21) his WBC has increased significantly again (21.2 up from 11).  CT abdomen is pending.  Clinical Assessment and Goals of Care:  I have reviewed medical records including EPIC notes, labs and imaging, received report from Dr. Nelson Chimes, assessed the patient and then called his daughter Valentino Saxon to discuss diagnosis prognosis, GOC, EOL wishes, disposition and options.  I introduced Palliative Medicine as specialized medical care for people living with serious illness. It focuses on providing relief from the symptoms and stress of a serious illness. The goal is to improve quality of life for both the patient and the family.  We discussed a brief life review of the patient. He has 9 living brothers and sisters and 1 living child Valentino Saxon).  Valentino Saxon works with her Rory Percy to make decisions for her father's care. 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  His wife suffered with cancer and passed away.  Per Valentino Saxon after that happened her father's personality changed and he became paranoid and  felt as though "the world was out to get him".  This became even worse after he lost his son in 2004 and he was hospitalized for behavioral health.  He has not been the same since.  He choose to live out of his wife's car for years.  He did not want help from [place] or anyone else.  More recently he lived in a hotel.  After his stroke in 2018 he became a resident at [facility].  As far as functional and nutritional status.  Per Dyann Ruddle has never had stomach problems in the past.  She feels overall that his quality of life is not good.  His left sided hemiparesis initially improved with therapy but then he began to refuse all therapy and became much less functional.  We discussed his current illness and what it means in the larger context of his on-going co-morbidities.  Natural disease trajectory and expectations at EOL were discussed.  Specifically we discussed whether or not his bowel will "wake up".   If his bowel does not wake up in the next few days and he is unable to take in enough nutrition to support himself Valentino Saxon will consider Hospice House.  She understands TPN is not a viable long term option.   If he is able to eat and drink she would like for him to return to [facility] - potentially with Hospice services as additional support.  I attempted to elicit values and goals of care important to the patient.  Valentino Saxon wants her father to be happy and comfortable.  She does not want him in pain or to experience suffering.  She does not want further surgery.  Advanced directives, concepts specific to code status, artifical feeding and hydration, and rehospitalization were considered and discussed.  If Mr. Lauder arrests, Valentino Saxon has asked that we let him be rather than resuscitating him.  She prefers to allow nature to take its course.  We agreed to meet tomorrow (5/22) to complete a MOST form.  Hospice and Palliative Care services outpatient were explained and offered.  Questions and concerns  were addressed. The family was encouraged to call with questions or concerns.    Primary Decision Maker:  NEXT OF KIN daughter Donnella Bi    SUMMARY OF RECOMMENDATIONS    Change code status to DNR - with full scope treatment for now. No further surgery  If he improves in the next several days and is able to eat - She would like for him to return to [facility] preferably with Hospice support If he does not improve and is unable to take oral nutrition - she would like to consider Hospice House.  Dani asked to be called with the results of the CT ordered today.  Code Status/Advance Care Planning:  DNR with full scope treatment   Symptom Management:   Dani asked if comfort feeds (tastes) would be possible for enjoyment.  Additional Recommendations (Limitations, Scope, Preferences):  Avoid Hospitalization and No Surgical Procedures.   If he does not improve she will not opt for continued artificial feeding   Prognosis:  To be determined.  Will reassess over the next few days.    Discharge Planning: To Be Determined  SNF with Hospice vs Hospice House      Primary Diagnoses: Present on Admission: . 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 Sepsis (HCC) . Hyperlipidemia . Essential hypertension . Dementia due to Parkinson's disease without behavioral disturbance (HCC) . Nausea and vomiting . N&V (nausea and vomiting)   I have reviewed the medical record, interviewed the patient and family, and examined the patient. The following aspects are pertinent.  Past Medical History:  Diagnosis Date  . Diabetes mellitus without complication (HCC)    type 2  . GERD (gastroesophageal reflux disease)   . Hyperlipidemia   . Hypertension   . No pertinent past medical history   . Parkinson's disease (HCC)   . Right hemiparesis (HCC)   . Stroke [facility]) 04/12/2017   Social History   Socioeconomic History  . Marital status:  Widowed    Spouse name: Not on file  . Number of children: 1  . Years of education: GED  . Highest education level: Not on file  Occupational History    Comment: retired  Engineer, production  . Financial resource strain: Not on file  . Food insecurity:    Worry: Not on file    Inability: Not on file  . Transportation needs:    Medical: Not on file    Non-medical: Not on file  Tobacco Use  . Smoking status: Former Smoker    Years: 20.00  Last attempt to quit: 06/22/1995    Years since quitting: 22.8  . Smokeless tobacco: Never Used  Substance and Sexual Activity  . Alcohol use: No  . Drug use: No  . Sexual activity: Not on file  Lifestyle  . Physical activity:    Days per week: Not on file    Minutes per session: Not on file  . Stress: Not on file  Relationships  . Social connections:    Talks on phone: Not on file    Gets together: Not on file    Attends religious service: Not on file    Active member of club or organization: Not on file    Attends meetings of clubs or organizations: Not on file    Relationship status: Not on file  Other Topics Concern  . Not on file  Social History Narrative   05/31/17 residing at [facility]   Family History  Problem Relation Age of Onset  . Stroke Mother    Scheduled Meds: . amantadine  200 mg Oral BID  . chlorhexidine  15 mL Mouth Rinse BID  . enoxaparin (LOVENOX) injection  40 mg Subcutaneous Daily  . insulin aspart  0-15 Units Subcutaneous Q4H  . mouth rinse  15 mL Mouth Rinse q12n4p  . sodium chloride flush  10-40 mL Intracatheter Q12H   Continuous Infusions: . sodium chloride    . dextrose 5 % and 0.45 % NaCl with KCl 20 mEq/L 60 mL/hr at 04/24/18 0457  . dextrose 5 % and 0.45% NaCl 1,000 mL with potassium chloride 20 mEq infusion    . lactated ringers Stopped (04/19/18 0831)  . piperacillin-tazobactam (ZOSYN)  IV 3.375 g (04/24/18 0456)  . potassium PHOSPHATE IVPB (mmol)    . sodium chloride    . TPN ADULT (ION)     . TPN ADULT (ION)     PRN Meds:.morphine injection, ondansetron **OR** ondansetron (ZOFRAN) IV, sodium chloride flush Allergies  Allergen Reactions  . No Known Allergies    Review of Systems demented.  Physical Exam  Well developed elderly male with dementia and hearing loss, NAD General loss of facial expression noted. CV brady Resp no distress, no w/c/r Abdomen distended, tight, bandage clean and dry LE 2-3+ edema in his feet   Vital Signs: BP 132/74   Pulse (!) 48   Temp 98.3 F (36.8 C)   Resp (!) 22   Ht 6' (1.829 m)   Wt 77.4 kg (170 lb 10.2 oz)   SpO2 97%   BMI 23.14 kg/m  Pain Scale: 0-10 POSS *See Group Information*: S-Acceptable,Sleep, easy to arouse Pain Score: 0-No pain   SpO2: SpO2: 97 % O2 Device:SpO2: 97 % O2 Flow Rate: .O2 Flow Rate (L/min): 1 L/min  IO: Intake/output summary:   Intake/Output Summary (Last 24 hours) at 04/24/2018 1021 Last data filed at 04/24/2018 0500 Gross per 24 hour  Intake 1467.33 ml  Output 800 ml  Net 667.33 ml    LBM: Last BM Date: (PTA) Baseline Weight: Weight: 86.2 kg (190 lb) Most recent weight: Weight: 77.4 kg (170 lb 10.2 oz)     Palliative Assessment/Data:20%     Time In: 12:50 pm Time Out: 2:00 pm Time Total: 70 min  Greater than 50%  of this time was spent counseling and coordinating care related to the above assessment and plan.  Signed by: Norvel Richards, PA-C Palliative Medicine Pager: [phone]  Please contact Palliative Medicine Team phone at [phone] for questions and concerns.  For  individual provider: See Shea Evans

## 2018-04-24 NOTE — Progress Notes (Addendum)
Spoke with Dr. Lindie Spruce regarding TPN as well as possible PICC line placement. Dr. Lindie Spruce stated that sine the pt is now going to be pallative/hospice care and TPN does not need to be started at this time. L IJ working without difficulty.

## 2018-04-24 NOTE — Progress Notes (Signed)
CSW continuing to follow for needs.  Reilynn Lauro LCSW [phone]  

## 2018-04-24 NOTE — Progress Notes (Signed)
PT Cancellation Note  Patient Details Name: Matthew Meza MRN: [id number] DOB: 01-Mar-1941   Cancelled Treatment:    Reason Eval/Treat Not Completed: Other (comment). Pt refusing mobility with PT, pointing to stomach and c/o pain. Per daughter's report in Palliative Care note, pt had not been working with therapies at [facility]. Pending pt's participation, may sign off after additional treatment attempt as pt likely near baseline functional status.   Ina Homes, PT, DPT Acute Rehab Services  Pager: [phone]  Malachy Chamber 04/24/2018, 2:49 PM

## 2018-04-24 NOTE — Progress Notes (Signed)
Spoke to Primary RN re PICC line order for TNA this morning due to current Jugular line tip is in brachiocephalic per CXR. Follow up around at this time with same RN, still no order for new central line. Pharmacy made aware, unable to hang TNA until new line is place.

## 2018-04-24 NOTE — Progress Notes (Addendum)
PHARMACY - ADULT TOTAL PARENTERAL NUTRITION CONSULT NOTE   Pharmacy Consult for TPN Indication: prolonged ileus  Patient Measurements: Height: 6' (182.9 cm) Weight: 170 lb 10.2 oz (77.4 kg) IBW/kg (Calculated) : 77.6 TPN AdjBW (KG): 86.2 Body mass index is 23.14 kg/m.  Assessment: 20 yom presenting 5/14 with N/V and portal venous gas with possible malrotation of small bowel, s/p ex lap 5/14 with no definitive findings. Pharmacy consulted to start TPN for prolonged ileus. Awaiting return of bowel function. Weight appears stable from previous admissions.  PMH: DM2, CVA, dementia/Parkinson's, HTN, HLD, GERD  GI: NGT out - patient refusing to have replaced, still with nausea, distended abdomen, diminished bowel sounds. TPN while awaiting bowel function return per Surgery. Pre-albumin 16.1>14.8. Pepcid in TPN, Zofran PRN Endo: hx DM. CBGs controlled Insulin requirements in the past 24 hours: 10 units moderate Q4H SSI in last 24hrs Lytes: K up to 4 (s/p K runs x 3 + K in IVF); Mag 1.7 stable (s/p mag sulfate 2g IV x 1); Phos down to 2.4; others WNL Renal: SCr stable 0.88. UOP 0.4 cc/kg/hr. D51/2NS+20K at 60 ml/hr Pulm: RA Cards: BP wnl/slightly high, HR variable 40-110s Hepatobil: LFTs WNL, Tbili 1.3 stable, TG 93 Neuro: amantadine, morphine PRN - pain score 0 ID: Zosyn day #8 for IAI. Afebrile, WBC up to 21.2. BCx negative  TPN Access: CVC double lumen placed 04/17/18 TPN start date: 04/23/18 Nutritional Goals (per updated RD recommendation 5/20): KCal: 1931 - 2318 Protein: 100 - 115 g Fluid: >2L  Goal TPN rate is 75 ml/hr  Current Nutrition:  NPO  Plan:  TPN not started last night due to confusion about patient's access. Communicated with RN and MD and will initiate TPN today at 56mL/hr. This TPN provides 54 g of protein, 118 g of dextrose, and 23 g of lipids which provides 842 kCals per day, meeting 52% of patient protein needs and 44% of kCal needs Electrolytes in TPN: standard  lytes; Cl:Ac 1:1 Add MVI, trace elements to TPN Continue moderate q4h SSI and adjust as needed Increase IVMF (D51/2NS+20K) back to 100 ml/hr until TPN starts - reduce to 65 ml/hr at 1800 when TPN bag hung KPhos IV x 1 Monitor TPN labs, Surgery plans F/u Zosyn LOT  Babs Bertin, PharmD, BCPS Clinical Pharmacist Clinical phone for 04/24/2018 until 3:30pm: [phone] If after 3:30pm, please call main pharmacy at: [phone] 04/24/2018 7:45 AM

## 2018-04-24 NOTE — Progress Notes (Signed)
PROGRESS NOTE    Matthew Meza  VHQ:[id number] DOB: [birth year] DOA: 04/17/2018 PCP: Blair Heys, MD   Brief Narrative:   77 year old with previous history of CVA post chronic left-sided deficit, Parkinson's disease, diabetes mellitus type 2-insulin-dependent, dementia, hyperlipidemia came to the hospital after having coffee-ground emesis and was noted to have portal vein gas.  In the ED he appeared to be septic with elevated WBC and lactate.  CT of the abdomen pelvis showed portal vein gas but no other acute process.  He was started on IV-, antibiotics and NG tube was placed.  General surgery took the patient to the OR for expiratory laparotomy on 5/14.  Since then patient has been slowly improving as her WBC has trended down so related.  His bowel sounds remain hypoactive.  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  Concerns of sepsis from intra-abdominal infection - Postop day 7 from Exp laparotomy.   -Patient continues to feel nausea, but refuses NG tube. Awaiting bowel function to return. In the Meantime patient to get TPN per surgery.  -Aggressively replete electrolytes, IV fluids as needed -Continue IV Zosyn. Due to rising WBC, CT of the abdomen pelvis with contrast has been ordered to further investigate any other intra-abdominal pathology -Out of bed to chair, increase activity - Provide supportive care - Spoke with the patient's daughter again today and updated her  about the patient's condition.  Appreciate palliative care input, they will be speaking over the phone later today and daughter to come into town tomorrow for an person meeting.  Hypokalemia -Replete as necessary  Diabetes mellitus type 2 -Continue Accu-Cheks and sliding scale  History of Parkinson's with some behavioral disturbances -Continue amantadine  Previous history of CVA with left-sided deficit -Holding Plavix.  Will restart when okay with surgery -Continue baclofen  DVT prophylaxis: SCDs Code Status: Full code Family Communication: Spoke with the patient's daughter Valentino Saxon over the phone Disposition Plan: Maintain in hospital stay  Consultants:   General surgery  Procedures:   Exploratory laparotomy 5/14  Antimicrobials:   IV Zosyn   Subjective: Patient continues to have hiccups, and have distended belly.  Refuses to have NG tube placed.  Review of Systems Otherwise negative except as per HPI, including: HEENT/EYES = negative for pain, redness, loss of vision, double vision, blurred vision, loss of hearing, sore throat, hoarseness, dysphagia Cardiovascular= negative for chest pain, palpitation, murmurs, lower extremity swelling Respiratory/lungs= negative for shortness of breath, cough, hemoptysis, wheezing, mucus production Gastrointestinal= negative for nausea, vomiting, melena, hematemesis Genitourinary= negative for Dysuria, Hematuria, Change in Urinary Frequency MSK = Negative for arthralgia, myalgias, Back Pain, Joint swelling  Neurology= Negative for headache, seizures, numbness, tingling  Psychiatry= Negative for anxiety, depression, suicidal and homocidal ideation Allergy/Immunology= Medication/Food allergy as listed  Skin= Negative for Rash, lesions, ulcers, itching     Objective: Vitals:   04/24/18 0051 04/24/18 0450 04/24/18 0828 04/24/18 1218  BP: (!) 151/99 132/74    Pulse: Marland Kitchen)  112 (!) 48    Resp: (!) 27 (!) 22    Temp:   98.3 F (36.8 C)  98.8 F (37.1 C)  TempSrc:      SpO2: 93% 97%    Weight:      Height:        Intake/Output Summary (Last 24 hours) at 04/24/2018 1225 Last data filed at 04/24/2018 0500 Gross per 24 hour  Intake 1467.33 ml  Output 800 ml  Net 667.33 ml   Filed Weights   04/22/18 0323 04/23/18 0524 04/23/18 1239  Weight: 76.4 kg (168 lb 6.9 oz) 77.4 kg (170 lb 10.2 oz) 77.4 kg (170 lb 10.2 oz)    Examination: Constitutional: Mild distress due to abdominal discomfort, elderly frail-appearing Eyes: PERRL, lids and conjunctivae normal ENMT: Mucous membranes are moist. Posterior pharynx clear of any exudate or lesions.Normal dentition.  Neck: normal, supple, no masses, no thyromegaly Respiratory: clear to auscultation bilaterally, no wheezing, no crackles. Normal respiratory effort. No accessory muscle use.  Cardiovascular: Regular rate and rhythm, no murmurs / rubs / gallops. No extremity edema. 2+ pedal pulses. No carotid bruits.  Abdomen: Abdomen is distended and tender to deep palpation Musculoskeletal: no clubbing / cyanosis. No joint deformity upper and lower extremities. Good ROM, no contractures. Normal muscle tone.  Skin: no rashes, lesions, ulcers. No induration Neurologic: CN 2-12 grossly intact. Sensation intact, DTR normal. Strength 5/5 in all 4.  Psychiatric: Overall he has very poor insight about his condition due to his dementia. AAOx1   Data Reviewed:   CBC: Recent Labs  Lab 04/20/18 0338 04/21/18 0322 04/22/18 0416 04/23/18 0430 04/24/18 0411  WBC 7.0 6.8 9.5 11.0* 21.2*  NEUTROABS  --   --   --   --  18.4*  HGB 12.0* 12.0* 12.9* 13.8 14.5  HCT 36.1* 35.6* 38.2* 41.2 43.2  MCV 91.6 91.8 91.4 93.4 91.5  PLT 144* 169 187 217 262   Basic Metabolic Panel: Recent Labs  Lab 04/20/18 0338 04/21/18 0322 04/22/18 0416 04/23/18 0430 04/23/18 1543 04/24/18 0411  NA 143 142 142 141  --  135  K 2.9* 3.1* 3.3* 3.7  --  4.0  CL 108 107 107 105  --  103  CO2 --   23  GLUCOSE 136* 133* 116* 140*  --  150*  BUN <5* <5* <5* <5*  --  6  CREATININE 0.84 0.81 0.80 0.90  --  0.88  CALCIUM 8.3* 8.1* 8.4* 8.7*  --  8.7*  MG 1.6*  --  1.8 1.7  --  1.7  PHOS  --   --   --   --  2.6 2.4*   GFR: Estimated Creatinine Clearance: 78.2 mL/min (by C-G formula based on SCr of 0.88 mg/dL). Liver Function Tests: Recent Labs  Lab 04/19/18 0438 04/20/18 0338 04/21/18 0322 04/23/18 1543 04/24/18 0411  AST ALT 36 36  ALKPHOS 61 65 60 75 68  BILITOT 1.5* 1.2 1.2 1.3* 1.3*  PROT 5.0* 5.2* 5.2* 6.0* 6.1*  ALBUMIN 2.7* 2.8* 2.7* 2.8* 2.9*   No results for input(s): LIPASE, AMYLASE in the last 168 hours. No results for input(s): AMMONIA in the last 168 hours. Coagulation Profile: No results for input(s): INR, PROTIME in the last 168 hours. Cardiac Enzymes: No results for input(s): CKTOTAL, CKMB, CKMBINDEX, TROPONINI in the last 168 hours. BNP (last 3 results) No results for input(s): PROBNP in the last  8760 hours. HbA1C: No results for input(s): HGBA1C in the last 72 hours. CBG: Recent Labs  Lab 04/23/18 1639 04/23/18 2026 04/23/18 2350 04/24/18 0415 04/24/18 0827  GLUCAP 159* 135* 140* 144* 156*   Lipid Profile: Recent Labs    04/24/18 0411  TRIG 93   Thyroid Function Tests: No results for input(s): TSH, T4TOTAL, FREET4, T3FREE, THYROIDAB in the last 72 hours. Anemia Panel: No results for input(s): VITAMINB12, FOLATE, FERRITIN, TIBC, IRON, RETICCTPCT in the last 72 hours. Sepsis Labs: Recent Labs  Lab 04/17/18 2122 04/18/18 0213 04/18/18 0905 04/18/18 1153  LATICACIDVEN 5.8* 2.3* 1.6 1.1    Recent Results (from the past 240 hour(s))  Blood Culture (routine x 2)     Status: None   Collection Time: 04/17/18  4:25 AM  Result Value Ref Range Status   Specimen Description BLOOD RIGHT WRIST  Final   Special Requests   Final    BOTTLES DRAWN AEROBIC AND ANAEROBIC Blood Culture adequate volume   Culture   Final     NO GROWTH 5 DAYS Performed at [facility], [address], [place], [place] [address]    Report Status 04/22/2018 FINAL  Final  Blood Culture (routine x 2)     Status: None   Collection Time: 04/17/18  5:39 AM  Result Value Ref Range Status   Specimen Description BLOOD RIGHT HAND  Final   Special Requests   Final    BOTTLES DRAWN AEROBIC AND ANAEROBIC Blood Culture adequate volume   Culture   Final    NO GROWTH 5 DAYS Performed at [facility], [address], [place], [place] [address]    Report Status 04/22/2018 FINAL  Final  MRSA PCR Screening     Status: None   Collection Time: 04/18/18  2:05 PM  Result Value Ref Range Status   MRSA by PCR NEGATIVE NEGATIVE Final    Comment:        The GeneXpert MRSA Assay (FDA approved for NASAL specimens only), is one component of a comprehensive MRSA colonization surveillance program. It is not intended to diagnose MRSA infection nor to guide or monitor treatment for MRSA infections. Performed at [facility], [address] [place], [place] [address]          Radiology Studies: Dg Chest Port 1 View  Result Date: 04/23/2018 CLINICAL DATA:  Central line placement. EXAM: PORTABLE CHEST 1 VIEW COMPARISON:  Chest radiograph Apr 17, 2018 FINDINGS: Slightly retracted LEFT internal jugular central venous catheter with distal tip projecting brachiocephalic confluence. No pneumothorax. Mildly elevated RIGHT hemidiaphragm with bibasilar strandy densities and small pleural effusions. Cardiomediastinal silhouette is normal. Soft tissue planes and included osseous structures are nonsuspicious. IMPRESSION: Slightly retracted LEFT IJ line distal tip projects a brachiocephalic confluence. Bibasilar atelectasis and small pleural effusions. Electronically Signed   By: Awilda Metro M.D.   On: 04/23/2018 17:55        Scheduled Meds: . amantadine  200 mg Oral BID  . chlorhexidine  15 mL Mouth Rinse BID  . enoxaparin  (LOVENOX) injection  40 mg Subcutaneous Daily  . insulin aspart  0-15 Units Subcutaneous Q4H  . iopamidol      . mouth rinse  15 mL Mouth Rinse q12n4p  . sodium chloride flush  10-40 mL Intracatheter Q12H   Continuous Infusions: . sodium chloride    . dextrose 5 % and 0.45 % NaCl with KCl 20 mEq/L 100 mL/hr at 04/24/18 1023  .  dextrose 5 % and 0.45% NaCl 1,000 mL with potassium chloride 20 mEq infusion    . lactated ringers Stopped (04/19/18 0831)  . piperacillin-tazobactam (ZOSYN)  IV 3.375 g (04/24/18 1220)  . potassium PHOSPHATE IVPB (mmol) 20 mmol (04/24/18 1027)  . sodium chloride    . TPN ADULT (ION)    . TPN ADULT (ION)       LOS: 7 days    I have spent 35 minutes face to face with the patient and on the ward discussing the patients care, assessment, plan and disposition with other care givers. >50% of the time was devoted counseling the patient about the risks and benefits of treatment and coordinating care.     Sharyn Brilliant Joline Maxcy, MD [facility] Pager [phone]   If 7PM-7AM, please contact night-coverage [email or web address] Password [facility] 04/24/2018, 12:25 PM

## 2018-04-24 NOTE — Progress Notes (Signed)
[facility] Progress Note  7 Days Post-Op  Subjective: CC-  Patient hiccupping this morning. Denies n/v. There is a BM recorded but patient nor staff cannot confirm. Not passing much gas.  WBC up to 21 today.  Objective: Vital signs in last 24 hours: Temp:  [97.9 F (36.6 C)-98.9 F (37.2 C)] 98.3 F (36.8 C) (05/21 0828) Pulse Rate:  [48-117] 48 (05/21 0450) Resp:  [22-31] 22 (05/21 0450) BP: (132-181)/(74-103) 132/74 (05/21 0450) SpO2:  [92 %-97 %] 97 % (05/21 0450) Weight:  [77.4 kg (170 lb 10.2 oz)] 77.4 kg (170 lb 10.2 oz) (05/20 1239) Last BM Date: (PTA)  Intake/Output from previous day: 05/20 0701 - 05/21 0700 In: 1467.3 [I.V.:1317.3; IV Piggyback:150] Out: 800 [Urine:800] Intake/Output this shift: No intake/output data recorded.  PE: Gen:  Alert, NAD, pleasant HEENT: EOM's intact, pupils equal and round Pulm:  CTAB, no W/R/R, effort normal Abd: Soft, mild distension, nontender, few BS, midline incision C/D/I with staples intact and no erythema or drainage Skin: warm and dry  Lab Results:  Recent Labs    04/23/18 0430 04/24/18 0411  WBC 11.0* 21.2*  HGB 13.8 14.5  HCT 41.2 43.2  PLT 217 262   BMET Recent Labs    04/23/18 0430 04/24/18 0411  NA 141 135  K 3.7 4.0  CL 105 103  CO2 26 23  GLUCOSE 140* 150*  BUN <5* 6  CREATININE 0.90 0.88  CALCIUM 8.7* 8.7*   PT/INR No results for input(s): LABPROT, INR in the last 72 hours. CMP     Component Value Date/Time   NA 135 04/24/2018 0411   K 4.0 04/24/2018 0411   CL 103 04/24/2018 0411   CO2 23 04/24/2018 0411   GLUCOSE 150 (H) 04/24/2018 0411   BUN 6 04/24/2018 0411   CREATININE 0.88 04/24/2018 0411   CREATININE 1.16 08/10/2016 1324   CALCIUM 8.7 (L) 04/24/2018 0411   PROT 6.1 (L) 04/24/2018 0411   ALBUMIN 2.9 (L) 04/24/2018 0411   AST 21 04/24/2018 0411   ALT 36 04/24/2018 0411   ALKPHOS 68 04/24/2018 0411   BILITOT 1.3 (H) 04/24/2018 0411   GFRNONAA >60 04/24/2018 0411    GFRAA >60 04/24/2018 0411   Lipase     Component Value Date/Time   LIPASE 25 04/17/2018 0423       Studies/Results: Dg Chest Port 1 View  Result Date: 04/23/2018 CLINICAL DATA:  Central line placement. EXAM: PORTABLE CHEST 1 VIEW COMPARISON:  Chest radiograph Apr 17, 2018 FINDINGS: Slightly retracted LEFT internal jugular central venous catheter with distal tip projecting brachiocephalic confluence. No pneumothorax. Mildly elevated RIGHT hemidiaphragm with bibasilar strandy densities and small pleural effusions. Cardiomediastinal silhouette is normal. Soft tissue planes and included osseous structures are nonsuspicious. IMPRESSION: Slightly retracted LEFT IJ line distal tip projects a brachiocephalic confluence. Bibasilar atelectasis and small pleural effusions. Electronically Signed   By: Awilda Metro M.D.   On: 04/23/2018 17:55    Anti-infectives: Anti-infectives (From admission, onward)   Start     Dose/Rate Route Frequency Ordered Stop   04/17/18 1330  piperacillin-tazobactam (ZOSYN) IVPB 3.375 g     3.375 g 12.5 mL/hr over 240 Minutes Intravenous Every 8 hours 04/17/18 0815     04/17/18 1200  piperacillin-tazobactam (ZOSYN) IVPB 3.375 g  Status:  Discontinued     3.375 g 12.5 mL/hr over 240 Minutes Intravenous Every 8 hours 04/17/18 0534 04/17/18 0815   04/17/18 0815  piperacillin-tazobactam (ZOSYN) IVPB 3.375 g  Status:  Discontinued     3.375 g 100 mL/hr over 30 Minutes Intravenous Every 8 hours 04/17/18 0801 04/17/18 0814   04/17/18 0515  piperacillin-tazobactam (ZOSYN) IVPB 3.375 g     3.375 g 100 mL/hr over 30 Minutes Intravenous  Once 04/17/18 0505 04/17/18 4098       Assessment/Plan DM H/o CVA with residual left sided weakness Dementia/Parkinson HTN HLD GERD  Portal Venousgaswith partial malrotation of the small bowel -S/p ex lap5/14 Dr. Donell Beers, no definitive findings to explain lactic acidosis, WBC, and portal venous gas. He did have some  hyperemic appears bowel with dilatation, but no definite malrotation at the time or surgery or compromised bowel. -POD 7 -BM recorded but patient nor staff can confirm; patient still with hiccups and some abdominal distension  FEN -TNA, NPO, but may have clears from the floor  VTE -Lovenox/SCDs/ can resume plavix from our standpoint ID -[email or web address]; 5/14 =>> day 8  Plan:  Patient still distended with hiccups. Increasing leukocytosis today. Check CT abdomen pelvis to rule out intraabdominal source.   LOS: 7 days    Franne Forts , [facility] 04/24/2018, 10:31 AM Pager: [phone] Consults: [phone] Mon-Fri 7:00 am-4:30 pm Sat-Sun 7:00 am-11:30 am

## 2018-04-25 DIAGNOSIS — A419 Sepsis, unspecified organism: Secondary | ICD-10-CM

## 2018-04-25 DIAGNOSIS — Z452 Encounter for adjustment and management of vascular access device: Secondary | ICD-10-CM

## 2018-04-25 DIAGNOSIS — R6520 Severe sepsis without septic shock: Secondary | ICD-10-CM

## 2018-04-25 DIAGNOSIS — Z789 Other specified health status: Secondary | ICD-10-CM

## 2018-04-25 DIAGNOSIS — K56600 Partial intestinal obstruction, unspecified as to cause: Secondary | ICD-10-CM

## 2018-04-25 DIAGNOSIS — Z4659 Encounter for fitting and adjustment of other gastrointestinal appliance and device: Secondary | ICD-10-CM

## 2018-04-25 LAB — BASIC METABOLIC PANEL
Anion gap: 6 (ref 5–15)
BUN: 6 mg/dL (ref 6–20)
CHLORIDE: 108 mmol/L (ref 101–111)
CO2: 26 mmol/L (ref 22–32)
CREATININE: 0.84 mg/dL (ref 0.61–1.24)
Calcium: 8.1 mg/dL — ABNORMAL LOW (ref 8.9–10.3)
GFR calc Af Amer: >60 mL/min (ref >60)
GFR calc non Af Amer: >60 mL/min (ref >60)
GLUCOSE: 127 mg/dL — AB (ref 65–99)
Potassium: 3.7 mmol/L (ref 3.5–5.1)
Sodium: 140 mmol/L (ref 135–145)

## 2018-04-25 LAB — CBC
HCT: 35 % — ABNORMAL LOW (ref 39.0–52.0)
Hemoglobin: 11.7 g/dL — ABNORMAL LOW (ref 13.0–17.0)
MCH: 31 pg (ref 26.0–34.0)
MCHC: 33.4 g/dL (ref 30.0–36.0)
MCV: 92.6 fL (ref 78.0–100.0)
PLATELETS: 219 10*3/uL (ref 150–400)
RBC: 3.78 MIL/uL — AB (ref 4.22–5.81)
RDW: 13.4 % (ref 11.5–15.5)
WBC: 10.2 10*3/uL (ref 4.0–10.5)

## 2018-04-25 LAB — GLUCOSE, CAPILLARY
Glucose-Capillary: 129 mg/dL — ABNORMAL HIGH (ref 65–99)
Glucose-Capillary: 127 mg/dL — ABNORMAL HIGH (ref 65–99)
Glucose-Capillary: 134 mg/dL — ABNORMAL HIGH (ref 65–99)
Glucose-Capillary: 147 mg/dL — ABNORMAL HIGH (ref 65–99)
Glucose-Capillary: 122 mg/dL — ABNORMAL HIGH (ref 65–99)

## 2018-04-25 LAB — PHOSPHORUS: Phosphorus: 1.8 mg/dL — ABNORMAL LOW (ref 2.5–4.6)

## 2018-04-25 MED ORDER — BACLOFEN 10 MG PO TABS
5.0000 mg | ORAL_TABLET | Freq: Three times a day (TID) | ORAL | Status: DC
  Administered 2018-04-25 – 2018-04-27 (×7): 5 mg via ORAL
  Filled 2018-04-25 (×7): qty 1

## 2018-04-25 MED ORDER — CLOPIDOGREL BISULFATE 75 MG PO TABS
75.0000 mg | ORAL_TABLET | Freq: Every day | ORAL | Status: DC
  Administered 2018-04-25 – 2018-05-01 (×7): 75 mg via ORAL
  Filled 2018-04-25 (×7): qty 1

## 2018-04-25 MED ORDER — BACLOFEN 10 MG PO TABS: 5.0000 mg | ORAL_TABLET | Freq: Two times a day (BID) | ORAL | Status: DC

## 2018-04-25 MED ORDER — CLOPIDOGREL BISULFATE 75 MG PO TABS: 75.0000 mg | ORAL_TABLET | Freq: Every day | ORAL | Status: DC

## 2018-04-25 MED ORDER — POTASSIUM PHOSPHATES 15 MMOLE/5ML IV SOLN
30.0000 mmol | Freq: Once | INTRAVENOUS | Status: AC
  Administered 2018-04-25: 30 mmol via INTRAVENOUS
  Filled 2018-04-25: qty 10

## 2018-04-25 MED ORDER — ATORVASTATIN CALCIUM 40 MG PO TABS
40.0000 mg | ORAL_TABLET | Freq: Every day | ORAL | Status: DC
  Administered 2018-04-25 – 2018-04-30 (×6): 40 mg
  Filled 2018-04-25 (×6): qty 1

## 2018-04-25 NOTE — Evaluation (Addendum)
Clinical/Bedside Swallow Evaluation Patient Details  Name: Matthew Meza MRN: [id number] Date of Birth: 05/14/1941  Today's Date: 04/25/2018 Time: SLP Start Time (ACUTE ONLY): 1017 SLP Stop Time (ACUTE ONLY): 1034 SLP Time Calculation (min) (ACUTE ONLY): 17 min  Past Medical History:  Past Medical History:  Diagnosis Date  . Diabetes mellitus without complication (HCC)    type 2  . GERD (gastroesophageal reflux disease)   . Hyperlipidemia   . Hypertension   . No pertinent past medical history   . Parkinson's disease (HCC)   . Right hemiparesis (HCC)   . Stroke [facility]) 04/12/2017   Past Surgical History:  Past Surgical History:  Procedure Laterality Date  . APPENDECTOMY    . ESOPHAGOGASTRODUODENOSCOPY N/A 04/07/2017   Procedure: ESOPHAGOGASTRODUODENOSCOPY (EGD);  Surgeon: Violeta Gelinas, MD;  Location: [facility] ENDOSCOPY;  Service: Endoscopy;  Laterality: N/A;  . IR ANGIO INTRA EXTRACRAN SEL COM CAROTID INNOMINATE UNI L MOD SED  03/31/2017  . IR INTRAVSC STENT CERV CAROTID W/O EMB-PROT MOD SED INC ANGIO  03/31/2017  . IR PERCUTANEOUS ART THROMBECTOMY/INFUSION INTRACRANIAL INC DIAG ANGIO  03/31/2017  . IR REPLC GASTRO/COLONIC TUBE PERCUT W/FLUORO  06/05/2017  . IR US GUIDE VASC ACCESS RIGHT  03/31/2017  . LAPAROSCOPIC APPENDECTOMY  06/21/2012   Procedure: APPENDECTOMY LAPAROSCOPIC;  Surgeon: Kandis Cocking, MD;  Location: [facility] ORS;  Service: General;  Laterality: N/A;  . LAPAROSCOPY N/A 04/17/2018   Procedure: LAPAROSCOPY DIAGNOSTIC;  Surgeon: Almond Lint, MD;  Location: [facility] OR;  Service: General;  Laterality: N/A;  . LAPAROTOMY N/A 04/17/2018   Procedure: EXPLORATORY LAPAROTOMY;  Surgeon: Almond Lint, MD;  Location: [facility] OR;  Service: General;  Laterality: N/A;  . NO PAST SURGERIES    . PEG PLACEMENT N/A 04/07/2017   Procedure: PERCUTANEOUS ENDOSCOPIC GASTROSTOMY (PEG) PLACEMENT;  Surgeon: Violeta Gelinas, MD;  Location: [facility] ENDOSCOPY;  Service: Endoscopy;  Laterality: N/A;  . RADIOLOGY WITH  ANESTHESIA N/A 03/31/2017   Procedure: RADIOLOGY WITH ANESTHESIA;  Surgeon: Gilmer Mor, DO;  Location: [facility] OR;  Service: Anesthesiology;  Laterality: N/A;   HPI:  Pt is a 77 y.o. male who was admitted on 04/17/2018 with abdominal pain with coffee ground emesis.  Imaging revealed volvulus and partial small bowel obstruction. Lactic acid and WBC were extremely elevated and small bowel ischemia was suspected.  Dr. Donell Beers performed an exploratory laproscopic exam that was unrevealing.  The patient's recovery has been slow and palliative care is involved with consideration for hospice. PMH includes parkinson's disease, dementia, previous CVA with dense left hemiparesis, DM, GERD, and mdoerate-severe oropharyngeal dysphagia in April 2018 due to reduced oral transit, severe pharyngeal residue without awareness, penetration to the vocal folds, and weak cough. PEG was placed at that time but had been d/c by the time he was seen again in July 2018. Per family report at that time he was eating a regular diet and thin liquids. No overt signs of aspiration were noted clinically but MBS was recommended due to history and CXR concerning for PNA and/or aspiration. Pt declined MBS despite education.   Assessment / Plan / Recommendation Clinical Impression  Pt has signs concerning for aspiration with intake of thin liquids and purees, including immediate coughing and wet vocal quality. Pt's voice is also mdoerately hoarse, which is not noted in prior SLP notes - only low volume. Pt was intubated this admission yet only for procedure over 1 week ago now. SLP provided Mod cues during intake for more upright positioning, smaller bites/sips, slower pacing. This  seemed to help reduce the frequency of his coughing but not eliminate it. Pt also only wanted very small amounts of POs. Overall this appears to be quite a decline in function from most recent clinical assessment in July  2018. Per discussion with palliative care team, the  pt/family would be interested in comfort feeds and pt continues to say he does not want additional testing for his swallowing. Given risk for aspiration likely with any consistencies given clinical presentation today, would continue with clear liquid diet as prescribed by surgical team, but with additional emphasis on use of aspiration precautions, careful feeding, and supervision to mitigate risks as much as possible. Would also focus heavily on thorough oral care. SLP will f/u for aadditional education, assessment as indicated. SLP Visit Diagnosis: Dysphagia, oropharyngeal phase (R13.12)    Aspiration Risk  Severe aspiration risk    Diet Recommendation Thin liquid(per surgeon; for comfort feeding)   Liquid Administration via: Cup Medication Administration: Crushed with puree Supervision: Patient able to self feed;Full supervision/cueing for compensatory strategies Compensations: Minimize environmental distractions;Slow rate;Small sips/bites Postural Changes: Seated upright at 90 degrees;Remain upright for at least 30 minutes after po intake    Other  Recommendations Oral Care Recommendations: Oral care QID   Follow up Recommendations Skilled Nursing facility(hospice)      Frequency and Duration min 2x/week  1 week       Prognosis Prognosis for Safe Diet Advancement: Guarded Barriers to Reach Goals: Cognitive deficits;Time post onset;Severity of deficits      Swallow Study   General HPI: Pt is a 77 y.o. male who was admitted on 04/17/2018 with abdominal pain with coffee ground emesis.  Imaging revealed volvulus and partial small bowel obstruction. Lactic acid and WBC were extremely elevated and small bowel ischemia was suspected.  Dr. Donell Beers performed an exploratory laproscopic exam that was unrevealing.  The patient's recovery has been slow and palliative care is involved with consideration for hospice. PMH includes parkinson's disease, dementia, previous CVA with dense left  hemiparesis, DM, GERD, and mdoerate-severe oropharyngeal dysphagia in April 2018 due to reduced oral transit, severe pharyngeal residue without awareness, penetration to the vocal folds, and weak cough. PEG was placed at that time but had been d/c by the time he was seen again in July 2018. Per family report at that time he was eating a regular diet and thin liquids. No overt signs of aspiration were noted clinically but MBS was recommended due to history and CXR concerning for PNA and/or aspiration. Pt declined MBS despite education. Type of Study: Bedside Swallow Evaluation Previous Swallow Assessment: see HPI Diet Prior to this Study: Thin liquids(clear liquid diet per MD) Temperature Spikes Noted: No Respiratory Status: Room air History of Recent Intubation: Yes Length of Intubations (days): (for procedure only) Date extubated: 04/17/18 Behavior/Cognition: Alert;Cooperative;Confused;Impulsive;Requires cueing Oral Care Completed by SLP: No Vision: Functional for self-feeding Self-Feeding Abilities: Able to feed self;Needs set up Patient Positioning: Upright in bed Baseline Vocal Quality: Hoarse Volitional Cough: Weak    Oral/Motor/Sensory Function     Ice Chips Ice chips: Not tested   Thin Liquid Thin Liquid: Impaired Presentation: Cup;Self Fed Pharyngeal  Phase Impairments: Cough - Immediate;Wet Vocal Quality    Nectar Thick Nectar Thick Liquid: Not tested   Honey Thick Honey Thick Liquid: Not tested   Puree Puree: Impaired Presentation: Self Fed;Spoon Oral Phase Functional Implications: Prolonged oral transit Pharyngeal Phase Impairments: Cough - Immediate;Wet Vocal Quality   Solid   GO   Solid: Not tested  Matthew Meza 04/25/2018,11:01 AM  Matthew Meza, M.A. CCC-SLP [phone]

## 2018-04-25 NOTE — Progress Notes (Addendum)
Met with daughter at bedside.  We discussed her father's situation and her goals for him.  She is thrilled he is awake, alert and eating jello.  We completed a MOST form.  This form will go into effect after he is medically optimized and discharges to [facility]. 1.  DNR 2.  Oral antibiotics if needed (as a comfort measure) 3.  No IV fluids 4.  No PEG or artificial feeding   Recommendations:    1.Once medically optimized (diet advanced) please discharge to [facility] place for long term care.  Patient has both medicaid and medicare. 2. Initiate Hospice Services at [facility].   Daughter Darden Dates asks for Hospice Services to contact her [phone]. 3. Patient will need feeding assistance with aspiration precautions.   Family accepts the risk of aspiration and requests comfort feedings.  Prognosis:  77 yo male with hx of CVA and parkinsons dementia.  After a prolonged hospitalization for severe sepsis and bowel obstruction,  he is immobile and aspirating.  He is refusing any aggressive care including therapy such as PT and OT.  He is at high risk for recurrent infection, contractures, bedsores.  Family has chosen full comfort measures and would prefer that he not return to the hospital.  FAST scale for dementia does not apply as he has Parkinsons Dementia rather than Alzheimers.   Most likely prognosis is less than 6 months and he is at high risk for an adverse event that could take his life at any time (aspiration).  Florentina Jenny, PA-C Palliative Medicine Pager: [phone]     Total time 60 min.

## 2018-04-25 NOTE — Progress Notes (Signed)
8 Days Post-Op    CC:  Subjective: Hiccups again this AM, he is really confused this AM.  Knows he's at [facility] but that is about all he got right.  I can't tell if he is doing well with clears or not.  He has bowel sounds this AM, and incision looks fine.    Objective: Vital signs in last 24 hours: Temp:  [97.1 F (36.2 C)-99 F (37.2 C)] 98 F (36.7 C) (05/22 0800) Pulse Rate:  [83-95] 90 (05/22 0800) Resp:  [13-21] 20 (05/22 0800) BP: (118-143)/(67-100) 118/75 (05/22 0800) SpO2:  [95 %-100 %] 95 % (05/22 0800) Last BM Date: 04/25/18 1369 IV 1600 urine  stools x 2 recorded Afebrile, VSS Labs OK this AM, WBC is down to 10.2 this AM  Intake/Output from previous day: 05/21 0701 - 05/22 0700 In: 1369.3 [I.V.:1169.3; IV Piggyback:200] Out: 1600 [Urine:1600] Intake/Output this shift: No intake/output data recorded.  General appearance: alert and confused, no distress or discomfort.  Hiccups still present. Resp: clear to auscultation bilaterally GI: soft, few BS, incision OK, + BM  Lab Results:  Recent Labs    04/24/18 0411 04/25/18 0448  WBC 21.2* 10.2  HGB 14.5 11.7*  HCT 43.2 35.0*  PLT 262 219    BMET Recent Labs    04/24/18 0411 04/25/18 0448  NA 135 140  K 4.0 3.7  CL 103 108  CO2 23 26  GLUCOSE 150* 127*  BUN 6 6  CREATININE 0.88 0.84  CALCIUM 8.7* 8.1*   PT/INR No results for input(s): LABPROT, INR in the last 72 hours.  Recent Labs  Lab 04/19/18 0438 04/20/18 0338 04/21/18 0322 04/23/18 1543 04/24/18 0411  AST ALT 36 36  ALKPHOS 61 65 60 75 68  BILITOT 1.5* 1.2 1.2 1.3* 1.3*  PROT 5.0* 5.2* 5.2* 6.0* 6.1*  ALBUMIN 2.7* 2.8* 2.7* 2.8* 2.9*     Lipase     Component Value Date/Time   LIPASE 25 04/17/2018 0423     Medications: . amantadine  200 mg Oral BID  . chlorhexidine  15 mL Mouth Rinse BID  . enoxaparin (LOVENOX) injection  40 mg Subcutaneous Daily  . insulin aspart  0-15 Units Subcutaneous Q4H  .  mouth rinse  15 mL Mouth Rinse q12n4p  . 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 Donell Beers, no definitive findings to explain lactic acidosis, WBC, and portal venous gas. He did have some hyperemic appears bowel with dilatation, but no definite malrotation at the time or surgery or compromised bowel. -POD8 - Multiple BM's reported yesterday   FEN -Clears VTE -Lovenox/SCDs/ can resume plavix from our standpoint ID -zosyn;5/14 =>> day 8  PLan:  I will let the nurse advance him to full liquids if he does well this AM with clears.  It would be ideal to mobilize him but not sure that is possible with him.           LOS: 8 days    Casha Estupinan 04/25/2018 [phone]

## 2018-04-25 NOTE — Progress Notes (Addendum)
PROGRESS NOTE  Matthew Meza MVH:[id number] DOB: 07-04-1941 DOA: 04/17/2018 PCP: Blair Heys, MD  HPI/Recap of past 83 hours: 77 year old with previous history of CVA post chronic left-sided deficit, Parkinson's disease, diabetes mellitus type 2-insulin-dependent, dementia, hyperlipidemia came to the hospital after having coffee-ground emesis and was noted to have portal vein gas.  In the ED he appeared to be septic with elevated WBC and lactate.  CT of the abdomen pelvis showed portal vein gas but no other acute process.  He was started on IV-, antibiotics and NG tube was placed.  General surgery took the patient to the OR for expiratory laparotomy on 5/14.  Since then patient has been slowly improving as her WBC has trended down so related.  His bowel sounds remain hypoactive.  04/25/2018: Patient seen and examined at his bedside.  He is very hard of hearing.  He has no new complaints today.  Good bowel sounds noted.  Does not appear in distress.  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 with concerns for ileus POD #8 status post ex-laparotomy Bowel sounds noted today Clear liquid diet started General surgery following Mobilize the patient if he is agreeable  Severe oral pharyngeal dysphasia with severe aspiration risk Speech therapy is following Recommendations for thin liquid Medication administration crushed with pure Special precautions  Intractable hiccups Start low dose baclofen  Concern for sepsis from  intra-abdominal infection No clear source Leukocytosis has resolved On IV Zosyn  Hypophosphatemia Phosphorus 1.8 Replete phosphorus with IV K-Phos 40 mEq once Repeat level in the morning  History of large right MCA infarct with petechial hemorrhage 04/01/2017 MRI brain with no contrast Continue statin Okay to resume Plavix from general surgery standpoint Monitor for any acute changes    Code Status: DNR  Family Communication: None at bedside  Disposition Plan: SNF when hemodynamically stable   Consultants:  General surgery  Palliative care team  Procedures:  POD #8 status post ex lap on 5/14 by Dr. Donell Beers  Antimicrobials:  IV Zosyn  DVT prophylaxis: Subcu Lovenox   Objective: Vitals:   04/24/18 2323 04/25/18 0040 04/25/18 0450 04/25/18 0800  BP:  (!) 142/67 126/68 118/75  Pulse:  95 95 90  Resp:   (!) 21 20  Temp: 98.9 F (37.2 C)  99 F (37.2 C) 98 F (36.7 C)  TempSrc: Oral  Oral Oral  SpO2:  100% 99% 95%  Weight:      Height:        Intake/Output Summary (Last 24 hours) at 04/25/2018 1104 Last data filed at 04/25/2018 1045 Gross per 24 hour  Intake 1619.33 ml  Output 2900 ml  Net -1280.67 ml   Filed Weights   04/22/18 0323 04/23/18 0524 04/23/18 1239  Weight: 76.4 kg (168 lb 6.9 oz) 77.4 kg (170 lb 10.2 oz) 77.4 kg (170 lb 10.2 oz)    Exam:  . General: 77 y.o. year-old male well developed well nourished in no acute distress.  Alert.  Hard of hearing. . Cardiovascular: Regular rate and rhythm  with no rubs or gallops.  No thyromegaly or JVD noted.   Marland Kitchen Respiratory: Clear to auscultation with no wheezes or rales. Good inspiratory effort. . Abdomen: Soft nontender nondistended with normal bowel sounds x4 quadrants. . Psychiatry: Mood is appropriate for condition and setting   Data Reviewed: CBC: Recent Labs  Lab 04/21/18 0322 04/22/18 0416 04/23/18 0430 04/24/18 0411 04/25/18 0448  WBC 6.8 9.5 11.0* 21.2* 10.2  NEUTROABS  --   --    --  18.4*  --   HGB 12.0* 12.9* 13.8 14.5 11.7*  HCT 35.6* 38.2* 41.2 43.2 35.0*  MCV 91.8 91.4 93.4 91.5 92.6  PLT 169 187 217 262 219   Basic Metabolic Panel: Recent Labs  Lab 04/20/18 0338 04/21/18 0322 04/22/18 0416 04/23/18 0430 04/23/18 1543 04/24/18 0411 04/25/18 0448  NA 143 142 142 141  --  135 140  K 2.9* 3.1* 3.3* 3.7  --  4.0 3.7  CL 108 107 107 105  --  103 108  CO2 --  23 26  GLUCOSE 136* 133* 116* 140*  --  150* 127*  BUN <5* <5* <5* <5*  --  6 6  CREATININE 0.84 0.81 0.80 0.90  --  0.88 0.84  CALCIUM 8.3* 8.1* 8.4* 8.7*  --  8.7* 8.1*  MG 1.6*  --  1.8 1.7  --  1.7  --   PHOS  --   --   --   --  2.6 2.4* 1.8*   GFR: Estimated Creatinine Clearance: 81.9 mL/min (by C-G formula based on SCr of 0.84 mg/dL). Liver Function Tests: Recent Labs  Lab 04/19/18 0438 04/20/18 0338 04/21/18 0322 04/23/18 1543 04/24/18 0411  AST ALT 36 36  ALKPHOS 61 65 60 75 68  BILITOT 1.5* 1.2 1.2 1.3* 1.3*  PROT 5.0* 5.2* 5.2* 6.0* 6.1*  ALBUMIN 2.7* 2.8* 2.7* 2.8* 2.9*   No results for input(s): LIPASE, AMYLASE in the last 168 hours. No results for input(s): AMMONIA in the last 168 hours. Coagulation Profile: No results for input(s): INR, PROTIME in the last 168 hours. Cardiac Enzymes: No results for input(s): CKTOTAL, CKMB, CKMBINDEX, TROPONINI in the last 168 hours. BNP (last 3 results) No results for input(s): PROBNP in the last 8760 hours. HbA1C: No results for input(s): HGBA1C in the last 72 hours. CBG: Recent Labs  Lab 04/24/18 1632 04/24/18 1935 04/24/18 2326 04/25/18 0436 04/25/18 0810  GLUCAP 147* 116* 113* 129* 127*   Lipid Profile: Recent Labs    04/24/18 0411  TRIG 93   Thyroid Function Tests: No results for input(s): TSH, T4TOTAL, FREET4, T3FREE, THYROIDAB in the last 72 hours. Anemia Panel: No results for input(s): VITAMINB12, FOLATE, FERRITIN, TIBC, IRON, RETICCTPCT in the last 72 hours. Urine  analysis:    Component Value Date/Time   COLORURINE YELLOW 04/17/2018 0656   APPEARANCEUR HAZY (A) 04/17/2018 0656   LABSPEC 1.032 (H) 04/17/2018 0656   PHURINE 5.0 04/17/2018 0656   GLUCOSEU NEGATIVE 04/17/2018 0656   HGBUR SMALL (A) 04/17/2018 0656   BILIRUBINUR NEGATIVE 04/17/2018 0656   BILIRUBINUR neg 04/02/2015 1432   KETONESUR 20 (A) 04/17/2018 0656   PROTEINUR 100 (A) 04/17/2018 0656   UROBILINOGEN 1.0 04/02/2015 1432   UROBILINOGEN 0.2 04/23/2014 1238   NITRITE NEGATIVE 04/17/2018 0656   LEUKOCYTESUR NEGATIVE 04/17/2018 0656   Sepsis Labs: (procalcitonin:4,lacticidven:4)  ) Recent Results (from the past 240 hour(s))  Blood Culture (routine x  2)     Status: None   Collection Time: 04/17/18  4:25 AM  Result Value Ref Range Status   Specimen Description BLOOD RIGHT WRIST  Final   Special Requests   Final    BOTTLES DRAWN AEROBIC AND ANAEROBIC Blood Culture adequate volume   Culture   Final    NO GROWTH 5 DAYS Performed at [facility], [address], [place], [place] [address]    Report Status 04/22/2018 FINAL  Final  Blood Culture (routine x 2)     Status: None   Collection Time: 04/17/18  5:39 AM  Result Value Ref Range Status   Specimen Description BLOOD RIGHT HAND  Final   Special Requests   Final    BOTTLES DRAWN AEROBIC AND ANAEROBIC Blood Culture adequate volume   Culture   Final    NO GROWTH 5 DAYS Performed at [facility], [address], [place], [place] [address]    Report Status 04/22/2018 FINAL  Final  MRSA PCR Screening     Status: None   Collection Time: 04/18/18  2:05 PM  Result Value Ref Range Status   MRSA by PCR NEGATIVE NEGATIVE Final    Comment:        The GeneXpert MRSA Assay (FDA approved for NASAL specimens only), is one component of a comprehensive MRSA colonization surveillance program. It is not intended to diagnose MRSA infection nor to guide or monitor treatment for MRSA infections. Performed at  [facility], [address] [place], [place] [address]       Studies: No results found.  Scheduled Meds: . amantadine  200 mg Oral BID  . chlorhexidine  15 mL Mouth Rinse BID  . enoxaparin (LOVENOX) injection  40 mg Subcutaneous Daily  . insulin aspart  0-15 Units Subcutaneous Q4H  . mouth rinse  15 mL Mouth Rinse q12n4p  . sodium chloride flush  10-40 mL Intracatheter Q12H    Continuous Infusions: . sodium chloride    . dextrose 5 % and 0.45 % NaCl with KCl 20 mEq/L 100 mL/hr at 04/25/18 0446  . famotidine (PEPCID) IV 20 mg (04/25/18 0854)  . lactated ringers Stopped (04/19/18 0831)  . piperacillin-tazobactam (ZOSYN)  IV Stopped (04/25/18 0850)  . sodium chloride       LOS: 8 days     Darlin Drop, MD [facility] Pager [phone]  If 7PM-7AM, please contact night-coverage [email or web address] Password [facility] 04/25/2018, 11:04 AM

## 2018-04-25 NOTE — Progress Notes (Signed)
Per RN patient had multiple large liquid bowel movements yesterday.  Today his stomach appears less distended and his WBC is trending down.    Patient is alert and taking sips without difficulty.  Would likely benefit from SLP and PT evals.    Per RN patient was talking about a little boy under his bed.  Plan to meet with patient's daughter later today.  Patient is DNR but FULL SCOPE TREATMENT as he is improving.  Norvel Richards, PA-C Palliative Medicine Pager: [phone]    No charge note.

## 2018-04-26 LAB — BASIC METABOLIC PANEL
ANION GAP: 8 (ref 5–15)
BUN: <5 mg/dL — ABNORMAL LOW (ref 6–20)
CALCIUM: 8.4 mg/dL — AB (ref 8.9–10.3)
CO2: 28 mmol/L (ref 22–32)
Chloride: 106 mmol/L (ref 101–111)
Creatinine, Ser: 0.88 mg/dL (ref 0.61–1.24)
GLUCOSE: 107 mg/dL — AB (ref 65–99)
Potassium: 3.5 mmol/L (ref 3.5–5.1)
SODIUM: 142 mmol/L (ref 135–145)

## 2018-04-26 LAB — CBC
HCT: 34.8 % — ABNORMAL LOW (ref 39.0–52.0)
HEMOGLOBIN: 11.5 g/dL — AB (ref 13.0–17.0)
MCH: 30.7 pg (ref 26.0–34.0)
MCHC: 33 g/dL (ref 30.0–36.0)
MCV: 93 fL (ref 78.0–100.0)
Platelets: 253 10*3/uL (ref 150–400)
RBC: 3.74 MIL/uL — AB (ref 4.22–5.81)
RDW: 13.7 % (ref 11.5–15.5)
WBC: 8.1 10*3/uL (ref 4.0–10.5)

## 2018-04-26 LAB — GLUCOSE, CAPILLARY
Glucose-Capillary: 109 mg/dL — ABNORMAL HIGH (ref 65–99)
Glucose-Capillary: 111 mg/dL — ABNORMAL HIGH (ref 65–99)
Glucose-Capillary: 118 mg/dL — ABNORMAL HIGH (ref 65–99)
Glucose-Capillary: 125 mg/dL — ABNORMAL HIGH (ref 65–99)
Glucose-Capillary: 154 mg/dL — ABNORMAL HIGH (ref 65–99)
Glucose-Capillary: 69 mg/dL (ref 65–99)
Glucose-Capillary: 83 mg/dL (ref 65–99)

## 2018-04-26 MED ORDER — ENSURE ENLIVE PO LIQD
237.0000 mL | Freq: Two times a day (BID) | ORAL | Status: DC
  Administered 2018-04-26 – 2018-05-01 (×10): 237 mL via ORAL

## 2018-04-26 NOTE — Progress Notes (Signed)
PROGRESS NOTE  Matthew Meza EAV:[id number] DOB: 1941/06/01 DOA: 04/17/2018 PCP: No primary care provider on file.  HPI/Recap of past 64 hours: 77 year old with previous history of CVA post chronic left-sided deficit, Parkinson's disease, diabetes mellitus type 2-insulin-dependent, dementia, hyperlipidemia came to the hospital after having coffee-ground emesis and was noted to have portal vein gas.  In the ED he appeared to be septic with elevated WBC and lactate.  CT of the abdomen pelvis showed portal vein gas but no other acute process.  He was started on IV-, antibiotics and NG tube was placed.  General surgery took the patient to the OR for expiratory laparotomy on 5/14.  Since then patient has been slowly improving as her WBC has trended down so related.  His bowel sounds remain hypoactive.  04/25/2018: Patient seen and examined at his bedside.  He is very hard of hearing.  He has no new complaints today.  Good bowel sounds noted.  Does not appear in distress.  04/26/18: No new complaints.  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)   Dementia due to Parkinson's disease without behavioral disturbance (HCC)   Type 2 diabetes mellitus (HCC)   Nausea and vomiting   N&V (nausea and vomiting)   Partial intestinal obstruction (HCC)   Severe sepsis (HCC)  Portal vein gas with partial small bowel rotation with concerns for ileus POD #9 status post ex-laparotomy Bowel sounds noted today gen surgery advancing diet Surgery planning on removing staples today Mobilize the patient if he is agreeable  Oropharyngeal dysphagia severe aspiration risk Speech therapy is following Recommendations for thin liquid Medication administration crushed  with pure Aspiration precautions  Hiccups C/w baclofen  Concern for sepsis from intra-abdominal infection No clear source Leukocytosis has resolved Stop IV zosyn  Hypophosphatemia Phosphorus 1.8 Replete phosphorus with IV K-Phos 40 mEq once Repeat level in the morning  History of large right MCA infarct with petechial hemorrhage 04/01/2017 MRI brain with no contrast Continue statin Okay to resume Plavix from general surgery standpoint Monitor for any acute changes    Code Status: DNR  Family Communication: None at bedside  Disposition Plan: SNF with hospice when hemodynamically stable   Consultants:  General surgery  Palliative care team  Procedures:  POD #9 status post ex lap on 5/14 by Dr. Donell Beers  Antimicrobials: None  DVT prophylaxis: Subcu Lovenox   Objective: Vitals:   04/26/18 1621 04/26/18 1630 04/26/18 1700 04/26/18 1848  BP:    127/86  Pulse:  71 76 94  Resp:  12  20  Temp: 97.6 F (36.4 C)     TempSrc: Axillary     SpO2:  100% 100% 97%  Weight:      Height:        Intake/Output Summary (Last 24 hours) at 04/26/2018 2100 Last data filed at 04/26/2018 1832 Gross per 24 hour  Intake 2248.75 ml  Output 275 ml  Net 1973.75 ml   Filed Weights   04/22/18 0323 04/23/18 0524 04/23/18 1239  Weight: 76.4 kg (168 lb 6.9 oz) 77.4 kg (170 lb 10.2 oz) 77.4 kg (170 lb 10.2 oz)    Exam:  . General: 77 y.o. year-old male WD wN NAD A&O x 3. . Cardiovascular: RRR no rubs or  gallops. No JVD or thyromegaly.   Marland Kitchen Respiratory: Clear to auscultation with no wheezes or rales. Good inspiratory effort. . Abdomen: Soft nontender nondistended with normal bowel sounds x4 quadrants. . Psychiatry: Mood is appropriate for condition and setting   Data Reviewed: CBC: Recent Labs  Lab 04/22/18 0416 04/23/18 0430 04/24/18 0411 04/25/18 0448 04/26/18 0437  WBC 9.5 11.0* 21.2* 10.2 8.1  NEUTROABS  --   --  18.4*  --   --   HGB 12.9* 13.8 14.5 11.7* 11.5*    HCT 38.2* 41.2 43.2 35.0* 34.8*  MCV 91.4 93.4 91.5 92.6 93.0  PLT 187 217 262 219 253   Basic Metabolic Panel: Recent Labs  Lab 04/20/18 0338  04/22/18 0416 04/23/18 0430 04/23/18 1543 04/24/18 0411 04/25/18 0448 04/26/18 0437  NA 143   < > 142 141  --  135 140 142  K 2.9*   < > 3.3* 3.7  --  4.0 3.7 3.5  CL 108   < > 107 105  --  103 108 106  CO2 27   < > 26 26  --  GLUCOSE 136*   < > 116* 140*  --  150* 127* 107*  BUN <5*   < > <5* <5*  --  6 6 <5*  CREATININE 0.84   < > 0.80 0.90  --  0.88 0.84 0.88  CALCIUM 8.3*   < > 8.4* 8.7*  --  8.7* 8.1* 8.4*  MG 1.6*  --  1.8 1.7  --  1.7  --   --   PHOS  --   --   --   --  2.6 2.4* 1.8*  --    < > = values in this interval not displayed.   GFR: Estimated Creatinine Clearance: 78.2 mL/min (by C-G formula based on SCr of 0.88 mg/dL). Liver Function Tests: Recent Labs  Lab 04/20/18 0338 04/21/18 0322 04/23/18 1543 04/24/18 0411  AST ALT 26 23 36 36  ALKPHOS 65 60 75 68  BILITOT 1.2 1.2 1.3* 1.3*  PROT 5.2* 5.2* 6.0* 6.1*  ALBUMIN 2.8* 2.7* 2.8* 2.9*   No results for input(s): LIPASE, AMYLASE in the last 168 hours. No results for input(s): AMMONIA in the last 168 hours. Coagulation Profile: No results for input(s): INR, PROTIME in the last 168 hours. Cardiac Enzymes: No results for input(s): CKTOTAL, CKMB, CKMBINDEX, TROPONINI in the last 168 hours. BNP (last 3 results) No results for input(s): PROBNP in the last 8760 hours. HbA1C: No results for input(s): HGBA1C in the last 72 hours. CBG: Recent Labs  Lab 04/26/18 0550 04/26/18 0758 04/26/18 1157 04/26/18 1619 04/26/18 1945  GLUCAP 111* 118* 125* 109* 154*   Lipid Profile: Recent Labs    04/24/18 0411  TRIG 93   Thyroid Function Tests: No results for input(s): TSH, T4TOTAL, FREET4, T3FREE, THYROIDAB in the last 72 hours. Anemia Panel: No results for input(s): VITAMINB12, FOLATE, FERRITIN, TIBC, IRON, RETICCTPCT in the last 72  hours. Urine analysis:    Component Value Date/Time   COLORURINE YELLOW 04/17/2018 0656   APPEARANCEUR HAZY (A) 04/17/2018 0656   LABSPEC 1.032 (H) 04/17/2018 0656   PHURINE 5.0 04/17/2018 0656   GLUCOSEU NEGATIVE 04/17/2018 0656   HGBUR SMALL (A) 04/17/2018 0656   BILIRUBINUR NEGATIVE 04/17/2018 0656   BILIRUBINUR neg 04/02/2015 1432   KETONESUR 20 (A) 04/17/2018 0656   PROTEINUR 100 (A) 04/17/2018 0656   UROBILINOGEN 1.0 04/02/2015 1432  UROBILINOGEN 0.2 04/23/2014 1238   NITRITE NEGATIVE 04/17/2018 0656   LEUKOCYTESUR NEGATIVE 04/17/2018 0656   Sepsis Labs: (procalcitonin:4,lacticidven:4)  ) Recent Results (from the past 240 hour(s))  Blood Culture (routine x 2)     Status: None   Collection Time: 04/17/18  4:25 AM  Result Value Ref Range Status   Specimen Description BLOOD RIGHT WRIST  Final   Special Requests   Final    BOTTLES DRAWN AEROBIC AND ANAEROBIC Blood Culture adequate volume   Culture   Final    NO GROWTH 5 DAYS Performed at [facility], [address], [place], [place] [address]    Report Status 04/22/2018 FINAL  Final  Blood Culture (routine x 2)     Status: None   Collection Time: 04/17/18  5:39 AM  Result Value Ref Range Status   Specimen Description BLOOD RIGHT HAND  Final   Special Requests   Final    BOTTLES DRAWN AEROBIC AND ANAEROBIC Blood Culture adequate volume   Culture   Final    NO GROWTH 5 DAYS Performed at [facility], [address], [place], [place] [address]    Report Status 04/22/2018 FINAL  Final  MRSA PCR Screening     Status: None   Collection Time: 04/18/18  2:05 PM  Result Value Ref Range Status   MRSA by PCR NEGATIVE NEGATIVE Final    Comment:        The GeneXpert MRSA Assay (FDA approved for NASAL specimens only), is one component of a comprehensive MRSA colonization surveillance program. It is not intended to diagnose MRSA infection nor to guide or monitor treatment for MRSA  infections. Performed at [facility], [address] [place], [place] [address]       Studies: No results found.  Scheduled Meds: . atorvastatin  40 mg Per Tube q1800  . baclofen  5 mg Oral TID  . chlorhexidine  15 mL Mouth Rinse BID  . clopidogrel  75 mg Oral Daily  . enoxaparin (LOVENOX) injection  40 mg Subcutaneous Daily  . feeding supplement (ENSURE ENLIVE)  237 mL Oral BID BM  . insulin aspart  0-15 Units Subcutaneous Q4H  . mouth rinse  15 mL Mouth Rinse q12n4p  . sodium chloride flush  10-40 mL Intracatheter Q12H    Continuous Infusions: . dextrose 5 % and 0.45 % NaCl with KCl 20 mEq/L 75 mL/hr at 04/26/18 1832  . famotidine (PEPCID) IV Stopped (04/26/18 1331)  . lactated ringers Stopped (04/19/18 0831)  . sodium chloride       LOS: 9 days     Darlin Drop, MD [facility] Pager [phone]  If 7PM-7AM, please contact night-coverage [email or web address] Password [email or web address] 04/26/2018, 9:00 PM

## 2018-04-26 NOTE — Progress Notes (Signed)
Nutrition Follow-up  DOCUMENTATION CODES:   Not applicable  INTERVENTION:  Ensure Enlive po BID, each supplement provides 350 kcal and 20 grams of protein  NUTRITION DIAGNOSIS:   Inadequate oral intake related to inability to eat as evidenced by NPO status. -diet now advanced to full liquids  GOAL:   Patient will meet greater than or equal to 90% of their needs -unmet  MONITOR:   Diet advancement, Weight trends, I & O's  ASSESSMENT:   Matthew Meza is a 77 y.o. male with medical history significant of CVA with chronic left-sided deficits, Parkinson's disease, type 2 diabetes on insulin, dementia, hyperlipidemia who comes in with one episode of coffee-ground emesis and portal venous gas. 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  TPN discontinued 5/22 Patient resting at bedside, said he had some applesauce this AM. Spoke with patient's RN, he didn't eat anything else for breakfast.  Had multiple BMs yesterday. Will monitor PO intake.  Labs reviewed:   Medications reviewed and include:  Insulin D5 1/2 NS 20K+ at 65mL/hr --> 306 calories  Diet Order:   Diet Order           Diet full liquid Room service appropriate? Yes; Fluid consistency: Thin  Diet effective now          EDUCATION NEEDS:   Not appropriate for education at this time  Skin:  Skin Assessment: Reviewed RN Assessment(closed incision to abdomen)  Last BM:  04/24/2018  Height:   Ht Readings from Last 1 Encounters:  04/17/18 6' (1.829 m)    Weight:   Wt Readings from Last 1 Encounters:  04/23/18 170 lb 10.2 oz (77.4 kg)    Ideal Body Weight:  80.9 kg  BMI:  Body mass index is 23.14 kg/m.  Estimated Nutritional Needs:   Kcal:  2841-3244 calories  Protein:  100-115 grams (1.3-1.5g/kg)  Fluid:  >2L  Dionne Ano. Neeka Urista, MS, RD LDN Inpatient Clinical Dietitian Pager [phone]

## 2018-04-26 NOTE — Progress Notes (Signed)
DR. Margo Aye called and reported he has a bed available today.  We have removed his staples and placed steri strips.  He can have site cleaned with soap and water.  Remove steri strips in 7 days if they do not come off with washing.   DC instructions and info for PRN follow up with Dr. Donell Beers in the AVS.

## 2018-04-26 NOTE — Progress Notes (Signed)
PT Cancellation Note  Patient Details Name: Matthew Meza MRN: [id number] DOB: 1940/12/31   Cancelled Treatment:    Reason Eval/Treat Not Completed: Other (comment)Pt has been refusing PT, he is immobile at baseline, family has chosen full comfort care measures; pt is returning to [facility] with Hospice services therefore no PT needs; PT signing off at this time   [facility] 04/26/2018, 8:29 AM

## 2018-04-26 NOTE — Progress Notes (Signed)
9 Days Post-Op      Subjective: No real change from our standpoint, he say he had grits and seems comfortable with this.  Wound OK, still has hiccups.  Refusing PT/OT. Not hallucinating currently.   Objective: Vital signs in last 24 hours: Temp:  [97.5 F (36.4 C)-98.6 F (37 C)] 98.2 F (36.8 C) (05/23 0800) Pulse Rate:  [87-100] 91 (05/23 0450) Resp:  [17-29] 22 (05/23 0450) BP: (114-140)/(59-90) 140/86 (05/23 0450) SpO2:  [98 %-99 %] 98 % (05/23 0450) Last BM Date: 04/24/18 250 PO recorded 2500 IV Urine 2800 No Bm recorded Afebrile VSS labs OK Intake/Output from previous day: 05/22 0701 - 05/23 0700 In: 2712.1 [P.O.:250; I.V.:1752.1; IV Piggyback:710] Out: 2800 [Urine:2800] Intake/Output this shift: Total I/O In: -  Out: 275 [Urine:275]  General appearance: alert, cooperative and no distress GI: soft, minimally sore, incision healing + BS  Lab Results:  Recent Labs    04/25/18 0448 04/26/18 0437  WBC 10.2 8.1  HGB 11.7* 11.5*  HCT 35.0* 34.8*  PLT 219 253    BMET Recent Labs    04/25/18 0448 04/26/18 0437  NA 140 142  K 3.7 3.5  CL 108 106  CO2 26 28  GLUCOSE 127* 107*  BUN 6 <5*  CREATININE 0.84 0.88  CALCIUM 8.1* 8.4*   PT/INR No results for input(s): LABPROT, INR in the last 72 hours.  Recent Labs  Lab 04/20/18 0338 04/21/18 0322 04/23/18 1543 04/24/18 0411  AST ALT 26 23 36 36  ALKPHOS 65 60 75 68  BILITOT 1.2 1.2 1.3* 1.3*  PROT 5.2* 5.2* 6.0* 6.1*  ALBUMIN 2.8* 2.7* 2.8* 2.9*     Lipase     Component Value Date/Time   LIPASE 25 04/17/2018 0423     Medications: . amantadine  200 mg Oral BID  . atorvastatin  40 mg Per Tube q1800  . baclofen  5 mg Oral TID  . chlorhexidine  15 mL Mouth Rinse BID  . clopidogrel  75 mg Oral Daily  . enoxaparin (LOVENOX) injection  40 mg Subcutaneous Daily  . insulin aspart  0-15 Units Subcutaneous Q4H  . mouth rinse  15 mL Mouth Rinse q12n4p  . 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 Donell Beers, no definitive findings to explain lactic acidosis, WBC, and portal venous gas. He did have some hyperemic appears bowel with dilatation, but no definite malrotation at the time or surgery or compromised bowel. -POD9 - Multiple BM's reported yesterday   FEN -Clears VTE -Lovenox/SCDs/ can resume plavix from our standpoint ID -[email or web address];5/14 =>>  Day 9  Plan:  Palliative management, I would keep his staples the 14 days since nutrition has been an issue.  We will try to get them out before he goes to [facility].   LOS: 9 days    Matthew Meza 04/26/2018 [phone]

## 2018-04-27 LAB — COMPREHENSIVE METABOLIC PANEL
ALK PHOS: 72 U/L (ref 38–126)
ALT: 60 U/L (ref 17–63)
ANION GAP: 10 (ref 5–15)
AST: 30 U/L (ref 15–41)
Albumin: 2.9 g/dL — ABNORMAL LOW (ref 3.5–5.0)
BILIRUBIN TOTAL: 1 mg/dL (ref 0.3–1.2)
BUN: <5 mg/dL — ABNORMAL LOW (ref 6–20)
CALCIUM: 8.8 mg/dL — AB (ref 8.9–10.3)
CO2: 25 mmol/L (ref 22–32)
Chloride: 105 mmol/L (ref 101–111)
Creatinine, Ser: 0.84 mg/dL (ref 0.61–1.24)
GFR calc non Af Amer: >60 mL/min (ref >60)
Glucose, Bld: 133 mg/dL — ABNORMAL HIGH (ref 65–99)
Potassium: 3.6 mmol/L (ref 3.5–5.1)
Sodium: 140 mmol/L (ref 135–145)
TOTAL PROTEIN: 6 g/dL — AB (ref 6.5–8.1)

## 2018-04-27 LAB — CBC
HCT: 39.1 % (ref 39.0–52.0)
HEMOGLOBIN: 12.9 g/dL — AB (ref 13.0–17.0)
MCH: 30.6 pg (ref 26.0–34.0)
MCHC: 33 g/dL (ref 30.0–36.0)
MCV: 92.9 fL (ref 78.0–100.0)
Platelets: 289 10*3/uL (ref 150–400)
RBC: 4.21 MIL/uL — ABNORMAL LOW (ref 4.22–5.81)
RDW: 13.8 % (ref 11.5–15.5)
WBC: 8.5 10*3/uL (ref 4.0–10.5)

## 2018-04-27 LAB — GLUCOSE, CAPILLARY
Glucose-Capillary: 108 mg/dL — ABNORMAL HIGH (ref 65–99)
Glucose-Capillary: 127 mg/dL — ABNORMAL HIGH (ref 65–99)
Glucose-Capillary: 129 mg/dL — ABNORMAL HIGH (ref 65–99)
Glucose-Capillary: 132 mg/dL — ABNORMAL HIGH (ref 65–99)
Glucose-Capillary: 138 mg/dL — ABNORMAL HIGH (ref 65–99)
Glucose-Capillary: 138 mg/dL — ABNORMAL HIGH (ref 65–99)
Glucose-Capillary: 143 mg/dL — ABNORMAL HIGH (ref 65–99)

## 2018-04-27 MED ORDER — CHLORPROMAZINE HCL 25 MG PO TABS
25.0000 mg | ORAL_TABLET | Freq: Three times a day (TID) | ORAL | Status: DC
  Administered 2018-04-27 – 2018-04-29 (×6): 25 mg via ORAL
  Filled 2018-04-27 (×6): qty 1

## 2018-04-27 NOTE — Progress Notes (Addendum)
PROGRESS NOTE  Matthew Meza FAO:[id number] DOB: 1941-03-27 DOA: 04/17/2018 PCP: No primary care provider on file.  HPI/Recap of past 45 hours: 77 year old with previous history of CVA post chronic left-sided deficit, Parkinson's disease, diabetes mellitus type 2-insulin-dependent, dementia, hyperlipidemia came to the hospital after having coffee-ground emesis and was noted to have portal vein gas.  In the ED he appeared to be septic with elevated WBC and lactate.  CT of the abdomen pelvis showed portal vein gas but no other acute process.  He was started on IV-, antibiotics and NG tube was placed.  General surgery took the patient to the OR for expiratory laparotomy on 5/14.  Since then patient has been slowly improving as her WBC has trended down so related.  His bowel sounds remain hypoactive.  04/27/2018: Patient seen and examined at his bedside.  He is very hard of hearing.  Has no new complaints.  Patient has not yet ambulated with PT.  He needs a lot of encouragement to mobilize.  RN reports persistent hiccups. Started on thorazine for intractable hiccups.  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)   Dementia due to Parkinson's disease without behavioral disturbance (HCC)   Type 2 diabetes mellitus (HCC)   Nausea and vomiting   N&V (nausea and vomiting)   Partial intestinal obstruction (HCC)   Severe sepsis (HCC)  Portal vein gas with partial small bowel rotation with concerns for ileus POD #10 status post ex-laparotomy Bowel sounds noted today gen surgery advancing diet Surgery removed staples yesterday 04/23/2018 Needs to mobilize/be assessed by PT prior to discharge Monitor stool output  Oropharyngeal dysphagia severe  aspiration risk Speech therapy is following Recommendations for thin liquid Medication administration crushed with pure Aspiration precautions  Intractable hiccups Start thorazine po 25 mg tid  Concern for sepsis from intra-abdominal infection No clear source Leukocytosis has resolved Stop IV zosyn  Hypophosphatemia Phosphorus 1.8 Replete phosphorus with IV K-Phos 40 mEq once Repeat level in the morning  History of large right MCA infarct with petechial hemorrhage 04/01/2017 MRI brain with no contrast Continue statin Okay to resume Plavix from general surgery standpoint Monitor for any acute changes    Code Status: DNR  Family Communication: None at bedside  Disposition Plan: SNF with hospice when hemodynamically stable   Consultants:  General surgery  Palliative care team  Procedures:  POD #10 status post ex lap on 5/14 by Dr. Donell Beers  Antimicrobials: None  DVT prophylaxis: Subcu Lovenox   Objective: Vitals:   04/27/18 0300 04/27/18 0500 04/27/18 0734 04/27/18 1208  BP: (!) 164/84  115/65 118/76  Pulse: 72  77 88  Resp: 18  (!) 21 (!) 23  Temp:  98 F (36.7 C) 98.5 F (36.9 C) 98.5 F (36.9 C)  TempSrc:  Oral Oral Oral  SpO2: 100%  98% 97%  Weight:      Height:        Intake/Output Summary (Last 24 hours) at 04/27/2018 1258 Last data filed at 04/27/2018 1112 Gross per 24 hour  Intake 2643.75 ml  Output 2950 ml  Net -306.25 ml   Filed Weights   04/22/18 0323 04/23/18 0524 04/23/18 1239  Weight: 76.4 kg (168 lb 6.9 oz) 77.4 kg (170 lb 10.2 oz) 77.4  kg (170 lb 10.2 oz)    Exam:  . General: 77 y.o. year-old male developed well-nourished in no acute distress.  Very hard of hearing  . cardiovascular: Rate and rhythm with no rubs or gallops.  No JVD or thyromegaly appreciated.  Respiratory: Clear to auscultation with no wheezes or rales. Good inspiratory effort. . Abdomen: Soft nontender nondistended with normal bowel sounds x4  quadrants. . Psychiatry: Mood is appropriate for condition and setting   Data Reviewed: CBC: Recent Labs  Lab 04/23/18 0430 04/24/18 0411 04/25/18 0448 04/26/18 0437 04/27/18 0559  WBC 11.0* 21.2* 10.2 8.1 8.5  NEUTROABS  --  18.4*  --   --   --   HGB 13.8 14.5 11.7* 11.5* 12.9*  HCT 41.2 43.2 35.0* 34.8* 39.1  MCV 93.4 91.5 92.6 93.0 92.9  PLT 217 262 219 253 289   Basic Metabolic Panel: Recent Labs  Lab 04/22/18 0416 04/23/18 0430 04/23/18 1543 04/24/18 0411 04/25/18 0448 04/26/18 0437 04/27/18 0559  NA 142 141  --  135 140 142 140  K 3.3* 3.7  --  4.0 3.7 3.5 3.6  CL 107 105  --  103 108 106 105  CO2 26 26  --  GLUCOSE 116* 140*  --  150* 127* 107* 133*  BUN <5* <5*  --  6 6 <5* <5*  CREATININE 0.80 0.90  --  0.88 0.84 0.88 0.84  CALCIUM 8.4* 8.7*  --  8.7* 8.1* 8.4* 8.8*  MG 1.8 1.7  --  1.7  --   --   --   PHOS  --   --  2.6 2.4* 1.8*  --   --    GFR: Estimated Creatinine Clearance: 81.9 mL/min (by C-G formula based on SCr of 0.84 mg/dL). Liver Function Tests: Recent Labs  Lab 04/21/18 0322 04/23/18 1543 04/24/18 0411 04/27/18 0559  AST ALT 23 36 36 60  ALKPHOS 60 75 68 72  BILITOT 1.2 1.3* 1.3* 1.0  PROT 5.2* 6.0* 6.1* 6.0*  ALBUMIN 2.7* 2.8* 2.9* 2.9*   No results for input(s): LIPASE, AMYLASE in the last 168 hours. No results for input(s): AMMONIA in the last 168 hours. Coagulation Profile: No results for input(s): INR, PROTIME in the last 168 hours. Cardiac Enzymes: No results for input(s): CKTOTAL, CKMB, CKMBINDEX, TROPONINI in the last 168 hours. BNP (last 3 results) No results for input(s): PROBNP in the last 8760 hours. HbA1C: No results for input(s): HGBA1C in the last 72 hours. CBG: Recent Labs  Lab 04/27/18 0006 04/27/18 0536 04/27/18 0604 04/27/18 0756 04/27/18 1149  GLUCAP 132* 129* 127* 108* 143*   Lipid Profile: No results for input(s): CHOL, HDL, LDLCALC, TRIG, CHOLHDL, LDLDIRECT in the last  72 hours. Thyroid Function Tests: No results for input(s): TSH, T4TOTAL, FREET4, T3FREE, THYROIDAB in the last 72 hours. Anemia Panel: No results for input(s): VITAMINB12, FOLATE, FERRITIN, TIBC, IRON, RETICCTPCT in the last 72 hours. Urine analysis:    Component Value Date/Time   COLORURINE YELLOW 04/17/2018 0656   APPEARANCEUR HAZY (A) 04/17/2018 0656   LABSPEC 1.032 (H) 04/17/2018 0656   PHURINE 5.0 04/17/2018 0656   GLUCOSEU NEGATIVE 04/17/2018 0656   HGBUR SMALL (A) 04/17/2018 0656   BILIRUBINUR NEGATIVE 04/17/2018 0656   BILIRUBINUR neg 04/02/2015 1432   KETONESUR 20 (A) 04/17/2018 0656   PROTEINUR 100 (A) 04/17/2018 0656   UROBILINOGEN 1.0 04/02/2015 1432   UROBILINOGEN 0.2 04/23/2014 1238   NITRITE  NEGATIVE 04/17/2018 0656   LEUKOCYTESUR NEGATIVE 04/17/2018 0656   Sepsis Labs: (procalcitonin:4,lacticidven:4)  ) Recent Results (from the past 240 hour(s))  MRSA PCR Screening     Status: None   Collection Time: 04/18/18  2:05 PM  Result Value Ref Range Status   MRSA by PCR NEGATIVE NEGATIVE Final    Comment:        The GeneXpert MRSA Assay (FDA approved for NASAL specimens only), is one component of a comprehensive MRSA colonization surveillance program. It is not intended to diagnose MRSA infection nor to guide or monitor treatment for MRSA infections. Performed at [facility], [address], [place], [place] [address]       Studies: No results found.  Scheduled Meds: . atorvastatin  40 mg Per Tube q1800  . baclofen  5 mg Oral TID  . chlorhexidine  15 mL Mouth Rinse BID  . clopidogrel  75 mg Oral Daily  . enoxaparin (LOVENOX) injection  40 mg Subcutaneous Daily  . feeding supplement (ENSURE ENLIVE)  237 mL Oral BID BM  . insulin aspart  0-15 Units Subcutaneous Q4H  . mouth rinse  15 mL Mouth Rinse q12n4p    Continuous Infusions: . dextrose 5 % and 0.45 % NaCl with KCl 20 mEq/L 1,000 mL (04/27/18 1112)  . famotidine (PEPCID) IV  Stopped (04/27/18 1020)  . lactated ringers Stopped (04/19/18 0831)  . sodium chloride       LOS: 10 days     Darlin Drop, MD [facility] Pager [phone]  If 7PM-7AM, please contact night-coverage [email or web address] Password [facility] 04/27/2018, 12:58 PM

## 2018-04-27 NOTE — Progress Notes (Signed)
SLP Cancellation Note  Patient Details Name: Matthew Meza MRN: [id number] DOB: September 29, 1941   Cancelled treatment:       Reason Eval/Treat Not Completed: Fatigue/lethargy limiting ability to participate  Patient was unable to rouse sufficiently to safely participate.    Dimas Aguas, MA, CCC-SLP Acute Rehab SLP [phone]  Fleet Contras 04/27/2018, 11:48 AM

## 2018-04-27 NOTE — Progress Notes (Signed)
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 Donell Beers, no definitive findings to explain lactic acidosis, WBC, and portal venous gas. He did have some hyperemic appears bowel with dilatation, but no definite malrotation at the time or surgery or compromised bowel.  FEN -FLD VTE -Lovenox/SCDs/plavix ID -zosyn;05/14-05/23 Follow up: with [facility] as needed  Plan:  SNF with hospice when hemodynamically stable per medicine. We will see PRN over the weekend.    LOS: 10 days    Subjective: CC: S/P ex lap  Hiccups. He thinks his vomit day was yesterday but he didn't vomit. Appears confused.    Objective: Vital signs in last 24 hours: Temp:  [97.6 F (36.4 C)-98.5 F (36.9 C)] 98.5 F (36.9 C) (05/24 0734) Pulse Rate:  [71-94] 77 (05/24 0734) Resp:  [12-21] 21 (05/24 0734) BP: (115-164)/(65-86) 115/65 (05/24 0734) SpO2:  [97 %-100 %] 98 % (05/24 0734) Last BM Date: 04/26/18  Intake/Output from previous day: 05/23 0701 - 05/24 0700 In: 2125 [P.O.:75; I.V.:1950; IV Piggyback:100] Out: 2775 [Urine:2775] Intake/Output this shift: No intake/output data recorded.  PE: Gen:  Alert, NAD, pleasant Pulm:  Rate and effort normal Abd: Soft, ND, +BS, mild TTP without guarding, midline incision with steri-strips and without signs of infection   Anti-infectives: Anti-infectives (From admission, onward)   Start     Dose/Rate Route Frequency Ordered Stop   04/17/18 1330  piperacillin-tazobactam (ZOSYN) IVPB 3.375 g  Status:  Discontinued     3.375 g 12.5 mL/hr over 240 Minutes Intravenous Every 8 hours 04/17/18 0815 04/26/18 1037   04/17/18 1200  piperacillin-tazobactam (ZOSYN) IVPB 3.375 g  Status:   Discontinued     3.375 g 12.5 mL/hr over 240 Minutes Intravenous Every 8 hours 04/17/18 0534 04/17/18 0815   04/17/18 0815  piperacillin-tazobactam (ZOSYN) IVPB 3.375 g  Status:  Discontinued     3.375 g 100 mL/hr over 30 Minutes Intravenous Every 8 hours 04/17/18 0801 04/17/18 0814   04/17/18 0515  piperacillin-tazobactam (ZOSYN) IVPB 3.375 g     3.375 g 100 mL/hr over 30 Minutes Intravenous  Once 04/17/18 0505 04/17/18 0633      Lab Results:  Recent Labs    04/26/18 0437 04/27/18 0559  WBC 8.1 8.5  HGB 11.5* 12.9*  HCT 34.8* 39.1  PLT 253 289   BMET Recent Labs    04/26/18 0437 04/27/18 0559  NA 142 140  K 3.5 3.6  CL 106 105  CO2 28 25  GLUCOSE 107* 133*  BUN <5* <5*  CREATININE 0.88 0.84  CALCIUM 8.4* 8.8*   PT/INR No results for input(s): LABPROT, INR in the last 72 hours. CMP     Component Value Date/Time   NA 140 04/27/2018 0559   K 3.6 04/27/2018 0559   CL 105 04/27/2018 0559   CO2 25 04/27/2018 0559   GLUCOSE 133 (H) 04/27/2018 0559   BUN <5 (L) 04/27/2018 0559   CREATININE 0.84 04/27/2018 0559   CREATININE 1.16 08/10/2016 1324   CALCIUM 8.8 (L) 04/27/2018 0559   PROT 6.0 (L) 04/27/2018 0559   ALBUMIN 2.9 (L) 04/27/2018 0559   AST 30 04/27/2018 0559   ALT 60 04/27/2018 0559   ALKPHOS 72 04/27/2018 0559   BILITOT 1.0 04/27/2018 0559  GFRNONAA >60 04/27/2018 0559   GFRAA >60 04/27/2018 0559   Lipase     Component Value Date/Time   LIPASE 25 04/17/2018 0423    Studies/Results: No results found.    Jerre Simon , [facility] Surgery 04/27/2018, 9:32 AM  Pager: [phone] Mon-Wed, Friday 7:00am-4:30pm Thurs 7am-11:30am  Consults: [phone]

## 2018-04-27 NOTE — Care Management Important Message (Signed)
Important Message  Patient Details  Name: Matthew Meza MRN: [id number] Date of Birth: 1941-02-26   Medicare Important Message Given:  Yes    [name] Catherine Cubero 04/27/2018, 3:33 PM

## 2018-04-27 NOTE — Progress Notes (Signed)
[facility] aware of patient wish to add Hospice services and can accept patient when ready.   Osborne Casco Leveon Pelzer LCSW [phone]

## 2018-04-28 LAB — CBC
HCT: 38.4 % — ABNORMAL LOW (ref 39.0–52.0)
Hemoglobin: 12.6 g/dL — ABNORMAL LOW (ref 13.0–17.0)
MCH: 30.4 pg (ref 26.0–34.0)
MCHC: 32.8 g/dL (ref 30.0–36.0)
MCV: 92.8 fL (ref 78.0–100.0)
PLATELETS: 301 10*3/uL (ref 150–400)
RBC: 4.14 MIL/uL — AB (ref 4.22–5.81)
RDW: 13.8 % (ref 11.5–15.5)
WBC: 9.6 10*3/uL (ref 4.0–10.5)

## 2018-04-28 LAB — GLUCOSE, CAPILLARY
Glucose-Capillary: 145 mg/dL — ABNORMAL HIGH (ref 65–99)
Glucose-Capillary: 127 mg/dL — ABNORMAL HIGH (ref 65–99)
Glucose-Capillary: 107 mg/dL — ABNORMAL HIGH (ref 65–99)
Glucose-Capillary: 173 mg/dL — ABNORMAL HIGH (ref 65–99)
Glucose-Capillary: 146 mg/dL — ABNORMAL HIGH (ref 65–99)
Glucose-Capillary: 120 mg/dL — ABNORMAL HIGH (ref 65–99)

## 2018-04-28 LAB — BASIC METABOLIC PANEL
ANION GAP: 7 (ref 5–15)
BUN: <5 mg/dL — ABNORMAL LOW (ref 6–20)
CO2: 24 mmol/L (ref 22–32)
Calcium: 8.8 mg/dL — ABNORMAL LOW (ref 8.9–10.3)
Chloride: 109 mmol/L (ref 101–111)
Creatinine, Ser: 0.85 mg/dL (ref 0.61–1.24)
GFR calc Af Amer: >60 mL/min (ref >60)
Glucose, Bld: 146 mg/dL — ABNORMAL HIGH (ref 65–99)
Potassium: 3.7 mmol/L (ref 3.5–5.1)
SODIUM: 140 mmol/L (ref 135–145)

## 2018-04-28 MED ORDER — KCL IN DEXTROSE-NACL 20-5-0.45 MEQ/L-%-% IV SOLN
INTRAVENOUS | Status: AC
  Administered 2018-04-28: 1000 mL via INTRAVENOUS
  Filled 2018-04-28: qty 1000

## 2018-04-28 MED ORDER — POLYETHYLENE GLYCOL 3350 17 G PO PACK
17.0000 g | PACK | Freq: Every day | ORAL | Status: DC
  Administered 2018-04-28 – 2018-05-01 (×4): 17 g via ORAL
  Filled 2018-04-28 (×4): qty 1

## 2018-04-28 NOTE — Progress Notes (Signed)
[facility] PROGRESS NOTE  Matthew Meza UEA:[id number] DOB: [birth year] DOA: 04/17/2018 PCP: No primary care provider on file.  Brief summary   77 year old with previous history of CVA post chronic left-sided hemiplegia, Parkinson's disease, diabetes mellitus type 2-insulin-dependent, dementia, hyperlipidemia came to the hospital after having coffee-ground emesis and was noted to have portal vein gas. In the ED he appeared to be septic with elevated WBC and lactate. CT of the abdomen pelvis showed portal vein gas but no other acute process. He was started on IV-, antibiotics and NG tube was placed. General surgery took the patient to the OR for expiratory laparotomy on 5/14. Since then patient has been slowly improving as her WBC has trended down so related. His bowel sounds remain hypoactive.  Assessment/Plan:  Portal vein gas with partial small bowel rotation with concerns for ileus. status post ex-laparotomy 5/14. Bowel sounds noted, gen surgery advancing diet.  Will ask speech eval. Surgery removed staples yesterday 04/23/2018. Needs to mobilize/be assessed by PT prior to discharge  Oropharyngeal dysphagia. severe aspiration risk. Speech therapy is following. Recommendations for thin liquid. Medication administration crushed with pure. Aspiration precautions  Intractable hiccups. Start thorazine po 25 mg tid  Concern for sepsis from intra-abdominal infection. No clear source. Leukocytosis has resolved.  Stopped IV zosyn  Hypophosphatemia. Phosphorus 1.8. Replete phosphorus   History of large right MCA infarct with petechial hemorrhage. 04/01/2017 MRI brain with no contrast Continue statin. Okay to resume Plavix from general surgery standpoint.    Code Status: DNR Family Communication: d/w patient, RN (indicate person spoken with, relationship, and if by phone, the number) Disposition Plan: SNF, hospice soon.    Consultants:  General surgery  Palliative  care team      Procedures:  status post ex lap on 5/14 by Dr. Donell Beers      Antibiotics: Anti-infectives (From admission, onward)   Start     Dose/Rate Route Frequency Ordered Stop   04/17/18 1330  piperacillin-tazobactam (ZOSYN) IVPB 3.375 g  Status:  Discontinued     3.375 g 12.5 mL/hr over 240 Minutes Intravenous Every 8 hours 04/17/18 0815 04/26/18 1037   04/17/18 1200  piperacillin-tazobactam (ZOSYN) IVPB 3.375 g  Status:  Discontinued     3.375 g 12.5 mL/hr over 240 Minutes Intravenous Every 8 hours 04/17/18 0534 04/17/18 0815   04/17/18 0815  piperacillin-tazobactam (ZOSYN) IVPB 3.375 g  Status:  Discontinued     3.375 g 100 mL/hr over 30 Minutes Intravenous Every 8 hours 04/17/18 0801 04/17/18 0814   04/17/18 0515  piperacillin-tazobactam (ZOSYN) IVPB 3.375 g     3.375 g 100 mL/hr over 30 Minutes Intravenous  Once 04/17/18 0505 04/17/18 0633        (indicate start date, and stop date if known)  HPI/Subjective: Alert. Left hemiplegia. No acute changes. Unable to perform speech eval. Will ask today   Objective: Vitals:   04/28/18 0406 04/28/18 0728  BP: 108/73 116/69  Pulse: 89 82  Resp: 18 14  Temp: (!) 97.3 F (36.3 C) 97.8 F (36.6 C)  SpO2: 100% 100%    Intake/Output Summary (Last 24 hours) at 04/28/2018 0917 Last data filed at 04/28/2018 0909 Gross per 24 hour  Intake 1875 ml  Output 1350 ml  Net 525 ml   Filed Weights   04/22/18 0323 04/23/18 0524 04/23/18 1239  Weight: 76.4 kg (168 lb 6.9 oz) 77.4 kg (170 lb 10.2 oz) 77.4 kg (170 lb 10.2 oz)    Exam:   General:  No distress   Cardiovascular: s1,s2 rrr  Respiratory: diminished in LL  Abdomen: soft, nt   Musculoskeletal: no leg edema    Data Reviewed: Basic Metabolic Panel: Recent Labs  Lab 04/22/18 0416 04/23/18 0430 04/23/18 1543 04/24/18 0411 04/25/18 0448 04/26/18 0437 04/27/18 0559 04/28/18 0349  NA 142 141  --  135 140 142 140 140  K 3.3* 3.7  --  4.0 3.7 3.5 3.6  3.7  CL 107 105  --  103 108 106 105 109  CO2 26 26  --  GLUCOSE 116* 140*  --  150* 127* 107* 133* 146*  BUN <5* <5*  --  6 6 <5* <5* <5*  CREATININE 0.80 0.90  --  0.88 0.84 0.88 0.84 0.85  CALCIUM 8.4* 8.7*  --  8.7* 8.1* 8.4* 8.8* 8.8*  MG 1.8 1.7  --  1.7  --   --   --   --   PHOS  --   --  2.6 2.4* 1.8*  --   --   --    Liver Function Tests: Recent Labs  Lab 04/23/18 1543 04/24/18 0411 04/27/18 0559  AST ALT 36 36 60  ALKPHOS 75 68 72  BILITOT 1.3* 1.3* 1.0  PROT 6.0* 6.1* 6.0*  ALBUMIN 2.8* 2.9* 2.9*   No results for input(s): LIPASE, AMYLASE in the last 168 hours. No results for input(s): AMMONIA in the last 168 hours. CBC: Recent Labs  Lab 04/24/18 0411 04/25/18 0448 04/26/18 0437 04/27/18 0559 04/28/18 0349  WBC 21.2* 10.2 8.1 8.5 9.6  NEUTROABS 18.4*  --   --   --   --   HGB 14.5 11.7* 11.5* 12.9* 12.6*  HCT 43.2 35.0* 34.8* 39.1 38.4*  MCV 91.5 92.6 93.0 92.9 92.8  PLT 262 219 253 289 301   Cardiac Enzymes: No results for input(s): CKTOTAL, CKMB, CKMBINDEX, TROPONINI in the last 168 hours. BNP (last 3 results) Recent Labs    06/03/17 1245  BNP 58.9    ProBNP (last 3 results) No results for input(s): PROBNP in the last 8760 hours.  CBG: Recent Labs  Lab 04/27/18 1610 04/27/18 2013 04/28/18 0152 04/28/18 0415 04/28/18 0757  GLUCAP 138* 138* 145* 127* 107*    Recent Results (from the past 240 hour(s))  MRSA PCR Screening     Status: None   Collection Time: 04/18/18  2:05 PM  Result Value Ref Range Status   MRSA by PCR NEGATIVE NEGATIVE Final    Comment:        The GeneXpert MRSA Assay (FDA approved for NASAL specimens only), is one component of a comprehensive MRSA colonization surveillance program. It is not intended to diagnose MRSA infection nor to guide or monitor treatment for MRSA infections. Performed at [facility], [address] [place], [place] [address]      Studies: No results  found.  Scheduled Meds: . atorvastatin  40 mg Per Tube q1800  . chlorhexidine  15 mL Mouth Rinse BID  . chlorproMAZINE  25 mg Oral TID  . clopidogrel  75 mg Oral Daily  . enoxaparin (LOVENOX) injection  40 mg Subcutaneous Daily  . feeding supplement (ENSURE ENLIVE)  237 mL Oral BID BM  . insulin aspart  0-15 Units Subcutaneous Q4H  . mouth rinse  15 mL Mouth Rinse q12n4p   Continuous Infusions: . dextrose 5 % and 0.45 % NaCl with KCl 20 mEq/L 75 mL/hr at  04/28/18 0142  . famotidine (PEPCID) IV Stopped (04/28/18 0934)  . lactated ringers Stopped (04/19/18 0831)  . 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 If 7PM-7AM, please contact night-coverage at [email or web address], password [facility] 04/28/2018, 9:17 AM  LOS: 11 days

## 2018-04-29 DIAGNOSIS — R6520 Severe sepsis without septic shock: Secondary | ICD-10-CM

## 2018-04-29 LAB — GLUCOSE, CAPILLARY
Glucose-Capillary: 119 mg/dL — ABNORMAL HIGH (ref 65–99)
Glucose-Capillary: 135 mg/dL — ABNORMAL HIGH (ref 65–99)
Glucose-Capillary: 140 mg/dL — ABNORMAL HIGH (ref 65–99)
Glucose-Capillary: 143 mg/dL — ABNORMAL HIGH (ref 65–99)
Glucose-Capillary: 150 mg/dL — ABNORMAL HIGH (ref 65–99)
Glucose-Capillary: 98 mg/dL (ref 65–99)

## 2018-04-29 MED ORDER — CALCIUM CARBONATE ANTACID 500 MG PO CHEW
2.0000 | CHEWABLE_TABLET | Freq: Two times a day (BID) | ORAL | Status: DC | PRN
  Administered 2018-04-30: 400 mg via ORAL
  Filled 2018-04-29: qty 2

## 2018-04-29 MED ORDER — CHLORPROMAZINE HCL 25 MG PO TABS
25.0000 mg | ORAL_TABLET | Freq: Three times a day (TID) | ORAL | Status: DC | PRN
  Filled 2018-04-29: qty 1

## 2018-04-29 MED ORDER — FAMOTIDINE 20 MG PO TABS
20.0000 mg | ORAL_TABLET | Freq: Two times a day (BID) | ORAL | Status: DC
  Administered 2018-04-29 – 2018-05-01 (×4): 20 mg via ORAL
  Filled 2018-04-29 (×4): qty 1

## 2018-04-29 NOTE — Progress Notes (Signed)
SLP Cancellation Note  Patient Details Name: ONAJE WARNE MRN: [id number] DOB: 10/31/1941   Cancelled treatment:       Reason Eval/Treat Not Completed: Other (comment). Per RN MD requesting SLP to reassess to determine if pt's diet can be advanced. Per palliative notes, family have expressed desire for comfort feeding, accepting risks of aspiration. Pt remains at high risk for aspiration. SLP attempted to visit pt to discuss comfort feeding, however no family present. Would advance diet as preferred by family members with focus on comfort measures as long as family continue to accept risks of aspiration with oral feeding. RN to page SLP if family members present and education desired.  Rondel Baton, [place], CCC-SLP Speech-Language Pathologist [phone]    Arlana Lindau 04/29/2018, 10:12 AM

## 2018-04-29 NOTE — Progress Notes (Signed)
[facility] PROGRESS NOTE  Matthew Meza XHB:[id number] DOB: Dec 20, 1940 DOA: 04/17/2018 PCP: No primary care provider on file.  Brief summary   77 year old with previous history of CVA post chronic left-sided hemiplegia, Parkinson's disease, diabetes mellitus type 2-insulin-dependent, dementia, hyperlipidemia came to the hospital after having coffee-ground emesis and was noted to have portal vein gas. In the ED he appeared to be septic with elevated WBC and lactate. CT of the abdomen pelvis showed portal vein gas but no other acute process. He was started on IV-, antibiotics and NG tube was placed. General surgery took the patient to the OR for expiratory laparotomy on 5/14. Since then patient has been slowly improving as her WBC has trended down so related. His bowel sounds remain hypoactive.  Assessment/Plan:  Portal vein gas with partial small bowel rotation with concerns for ileus. status post ex-laparotomy 5/14. Bowel sounds noted, gen surgery advancing diet.  Consulted speech eval. Surgery removed staples yesterday 04/23/2018. Needs to mobilize/be assessed by PT prior to discharge  Oropharyngeal dysphagia. severe aspiration risk. Speech therapy is following. Recommendations for thin liquid. Medication administration crushed with pure. Aspiration precautions. re consulted speech  Intractable hiccups. Resolved with thorazine po 25 mg tid  Concern for sepsis from intra-abdominal infection. No clear source. Leukocytosis has resolved. Blood cultures: NGTD. Stopped IV zosyn  Hypophosphatemia. Phosphorus 1.8. Replete phosphorus   History of large right MCA infarct with petechial hemorrhage. 04/01/2017 MRI brain with no contrast Continue statin. Okay to resume Plavix from general surgery standpoint.    Code Status: DNR Family Communication: d/w patient, RN (indicate person spoken with, relationship, and if by phone, the number) Disposition Plan: SNF, hospice soon.  Obtain speech eval.    Consultants:  General surgery  Palliative care team      Procedures:  status post ex lap on 5/14 by Dr. Donell Beers      Antibiotics: Anti-infectives (From admission, onward)   Start     Dose/Rate Route Frequency Ordered Stop   04/17/18 1330  piperacillin-tazobactam (ZOSYN) IVPB 3.375 g  Status:  Discontinued     3.375 g 12.5 mL/hr over 240 Minutes Intravenous Every 8 hours 04/17/18 0815 04/26/18 1037   04/17/18 1200  piperacillin-tazobactam (ZOSYN) IVPB 3.375 g  Status:  Discontinued     3.375 g 12.5 mL/hr over 240 Minutes Intravenous Every 8 hours 04/17/18 0534 04/17/18 0815   04/17/18 0815  piperacillin-tazobactam (ZOSYN) IVPB 3.375 g  Status:  Discontinued     3.375 g 100 mL/hr over 30 Minutes Intravenous Every 8 hours 04/17/18 0801 04/17/18 0814   04/17/18 0515  piperacillin-tazobactam (ZOSYN) IVPB 3.375 g     3.375 g 100 mL/hr over 30 Minutes Intravenous  Once 04/17/18 0505 04/17/18 0633       (indicate start date, and stop date if known)  HPI/Subjective: Alert. Chronic Left hemiplegia. No acute changes. re consulted  speech eval  Objective: Vitals:   04/29/18 0514 04/29/18 0807  BP: 96/62   Pulse: 86   Resp: 20   Temp: 97.8 F (36.6 C) 97.7 F (36.5 C)  SpO2: 99%     Intake/Output Summary (Last 24 hours) at 04/29/2018 0951 Last data filed at 04/29/2018 8101 Gross per 24 hour  Intake 762.5 ml  Output 1950 ml  Net -1187.5 ml   Filed Weights   04/22/18 0323 04/23/18 0524 04/23/18 1239  Weight: 76.4 kg (168 lb 6.9 oz) 77.4 kg (170 lb 10.2 oz) 77.4 kg (170 lb 10.2 oz)    Exam:  General:  No distress   Cardiovascular: s1,s2 rrr  Respiratory: diminished in LL  Abdomen: soft, nt   Musculoskeletal: no leg edema    Data Reviewed: Basic Metabolic Panel: Recent Labs  Lab 04/23/18 0430 04/23/18 1543 04/24/18 0411 04/25/18 0448 04/26/18 0437 04/27/18 0559 04/28/18 0349  NA 141  --  135 140 142 140 140  K 3.7  --   4.0 3.7 3.5 3.6 3.7  CL 105  --  103 108 106 105 109  CO2 26  --  GLUCOSE 140*  --  150* 127* 107* 133* 146*  BUN <5*  --  6 6 <5* <5* <5*  CREATININE 0.90  --  0.88 0.84 0.88 0.84 0.85  CALCIUM 8.7*  --  8.7* 8.1* 8.4* 8.8* 8.8*  MG 1.7  --  1.7  --   --   --   --   PHOS  --  2.6 2.4* 1.8*  --   --   --    Liver Function Tests: Recent Labs  Lab 04/23/18 1543 04/24/18 0411 04/27/18 0559  AST ALT 36 36 60  ALKPHOS 75 68 72  BILITOT 1.3* 1.3* 1.0  PROT 6.0* 6.1* 6.0*  ALBUMIN 2.8* 2.9* 2.9*   No results for input(s): LIPASE, AMYLASE in the last 168 hours. No results for input(s): AMMONIA in the last 168 hours. CBC: Recent Labs  Lab 04/24/18 0411 04/25/18 0448 04/26/18 0437 04/27/18 0559 04/28/18 0349  WBC 21.2* 10.2 8.1 8.5 9.6  NEUTROABS 18.4*  --   --   --   --   HGB 14.5 11.7* 11.5* 12.9* 12.6*  HCT 43.2 35.0* 34.8* 39.1 38.4*  MCV 91.5 92.6 93.0 92.9 92.8  PLT 262 219 253 289 301   Cardiac Enzymes: No results for input(s): CKTOTAL, CKMB, CKMBINDEX, TROPONINI in the last 168 hours. BNP (last 3 results) Recent Labs    06/03/17 1245  BNP 58.9    ProBNP (last 3 results) No results for input(s): PROBNP in the last 8760 hours.  CBG: Recent Labs  Lab 04/28/18 1715 04/28/18 2022 04/29/18 0032 04/29/18 0449 04/29/18 0805  GLUCAP 146* 120* 143* 140* 119*    No results found for this or any previous visit (from the past 240 hour(s)).   Studies: No results found.  Scheduled Meds: . atorvastatin  40 mg Per Tube q1800  . chlorhexidine  15 mL Mouth Rinse BID  . chlorproMAZINE  25 mg Oral TID  . clopidogrel  75 mg Oral Daily  . enoxaparin (LOVENOX) injection  40 mg Subcutaneous Daily  . feeding supplement (ENSURE ENLIVE)  237 mL Oral BID BM  . insulin aspart  0-15 Units Subcutaneous Q4H  . mouth rinse  15 mL Mouth Rinse q12n4p  . polyethylene glycol  17 g Oral Daily   Continuous Infusions: . famotidine (PEPCID) IV Stopped  (04/29/18 0927)  . lactated ringers Stopped (04/19/18 0831)  . 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 If 7PM-7AM, please contact night-coverage at [email or web address], password [facility] 04/29/2018, 9:51 AM  LOS: 12 days

## 2018-04-30 LAB — GLUCOSE, CAPILLARY
Glucose-Capillary: 111 mg/dL — ABNORMAL HIGH (ref 65–99)
Glucose-Capillary: 116 mg/dL — ABNORMAL HIGH (ref 65–99)
Glucose-Capillary: 119 mg/dL — ABNORMAL HIGH (ref 65–99)
Glucose-Capillary: 121 mg/dL — ABNORMAL HIGH (ref 65–99)
Glucose-Capillary: 126 mg/dL — ABNORMAL HIGH (ref 65–99)
Glucose-Capillary: 129 mg/dL — ABNORMAL HIGH (ref 65–99)

## 2018-04-30 MED ORDER — INSULIN ASPART 100 UNIT/ML ~~LOC~~ SOLN
0.0000 [IU] | Freq: Three times a day (TID) | SUBCUTANEOUS | Status: DC
  Administered 2018-05-01 (×2): 2 [IU] via SUBCUTANEOUS

## 2018-04-30 NOTE — Progress Notes (Signed)
PROGRESS NOTE  Matthew Meza ZOX:[id number] DOB: 06-23-1941 DOA: 04/17/2018 PCP: No primary care provider on file.  HPI/Recap of past 61 hours: 77 year old with previous history of CVA post chronic left-sided deficit, Parkinson's disease, diabetes mellitus type 2-insulin-dependent, dementia, hyperlipidemia came to the hospital after having coffee-ground emesis and was noted to have portal vein gas.  In the ED he appeared to be septic with elevated WBC and lactate.  CT of the abdomen pelvis showed portal vein gas but no other acute process.  He was started on IV-, antibiotics and NG tube was placed.  General surgery took the patient to the OR for expiratory laparotomy on 5/14.  Since then patient has been slowly improving as her WBC has trended down so related.  His bowel sounds remain hypoactive.  04/30/2018: Patient seen and examined at his bedside.  No new complaints.  Very hard of hearing.  Awaiting speech evaluation for advancement of diet and PT evaluation prior to discharge.  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)   Dementia due to Parkinson's disease without behavioral disturbance (HCC)   Type 2 diabetes mellitus (HCC)   Nausea and vomiting   N&V (nausea and vomiting)   Partial intestinal obstruction (HCC)   Severe sepsis (HCC)  Portal vein gas or partial small bowel obstruction with concern for ileus POD #12 status post ex-laparotomy  Bowel sounds noted today gen surgery advancing diet Surgery removed staples 04/23/2018 Needs to mobilize/be assessed by PT prior to discharge Monitor stool output  Oropharyngeal dysphagia Severe aspiration risk Awaiting speech evaluation for advancement of diet Recommendations for thin  liquid Medication administration crushed with pure Aspiration precautions  Intractable hiccups Continue Thorazine p.o. 25 mg 3 times daily  Concern for sepsis from intra-abdominal infection no clear source Completed IV Zosyn   Hypophosphatemia  Phosphorus 1.8 Replete phosphorus with IV K-Phos 40 mEq once Repeat level in the morning  History of large right MCA infarct with petechial hemorrhage  Poor prognosis  Palliative care consulted  04/01/2017 MRI brain with no contrast Continue statin Okay to resume Plavix from general surgery standpoint Monitor for any acute changes    Code Status: DNR  Family Communication: None at bedside  Disposition Plan: SNF with hospice when hemodynamically stable   Consultants:  General surgery  Palliative care team  Procedures:  POD #12 status post ex lap on 5/14 by Dr. Donell Beers  Antimicrobials: None  DVT prophylaxis: Subcu Lovenox   Objective: Vitals:   04/30/18 0500 04/30/18 0600 04/30/18 0813 04/30/18 1216  BP:  (!) 109/57    Pulse: (!) 102 (!) 106    Resp: (!) 22 14    Temp:   (!) 97.3 F (36.3 C) 97.9 F (36.6 C)  TempSrc:   Oral   SpO2: 98% 97%    Weight:  73.2 kg (161 lb 6 oz)    Height:        Intake/Output Summary (Last 24 hours) at 04/30/2018 1227 Last data filed at 04/29/2018 1642 Gross per 24 hour  Intake 360 ml  Output 700 ml  Net -340 ml   Filed Weights   04/23/18 0524 04/23/18 1239 04/30/18 0600  Weight: 77.4 kg (170 lb 10.2 oz) 77.4 kg (170 lb 10.2 oz) 73.2 kg (  161 lb 6 oz)    Exam:  . General: 77 y.o. year-old male well-developed well-nourished in no acute distress.  Alert very hard of hearing.  cardiovascular: Regular rate and rhythm with no rubs or gallops.  No JVD or thyromegaly.    Respiratory: Clear to auscultation with no wheezes or rales. Good inspiratory effort. . Abdomen: Soft nontender nondistended with normal bowel sounds x4 quadrants. . Psychiatry: Mood is appropriate for condition  and setting   Data Reviewed: CBC: Recent Labs  Lab 04/24/18 0411 04/25/18 0448 04/26/18 0437 04/27/18 0559 04/28/18 0349  WBC 21.2* 10.2 8.1 8.5 9.6  NEUTROABS 18.4*  --   --   --   --   HGB 14.5 11.7* 11.5* 12.9* 12.6*  HCT 43.2 35.0* 34.8* 39.1 38.4*  MCV 91.5 92.6 93.0 92.9 92.8  PLT 262 219 253 289 301   Basic Metabolic Panel: Recent Labs  Lab 04/23/18 1543 04/24/18 0411 04/25/18 0448 04/26/18 0437 04/27/18 0559 04/28/18 0349  NA  --  135 140 142 140 140  K  --  4.0 3.7 3.5 3.6 3.7  CL  --  103 108 106 105 109  CO2  --  GLUCOSE  --  150* 127* 107* 133* 146*  BUN  --  6 6 <5* <5* <5*  CREATININE  --  0.88 0.84 0.88 0.84 0.85  CALCIUM  --  8.7* 8.1* 8.4* 8.8* 8.8*  MG  --  1.7  --   --   --   --   PHOS 2.6 2.4* 1.8*  --   --   --    GFR: Estimated Creatinine Clearance: 76.5 mL/min (by C-G formula based on SCr of 0.85 mg/dL). Liver Function Tests: Recent Labs  Lab 04/23/18 1543 04/24/18 0411 04/27/18 0559  AST ALT 36 36 60  ALKPHOS 75 68 72  BILITOT 1.3* 1.3* 1.0  PROT 6.0* 6.1* 6.0*  ALBUMIN 2.8* 2.9* 2.9*   No results for input(s): LIPASE, AMYLASE in the last 168 hours. No results for input(s): AMMONIA in the last 168 hours. Coagulation Profile: No results for input(s): INR, PROTIME in the last 168 hours. Cardiac Enzymes: No results for input(s): CKTOTAL, CKMB, CKMBINDEX, TROPONINI in the last 168 hours. BNP (last 3 results) No results for input(s): PROBNP in the last 8760 hours. HbA1C: No results for input(s): HGBA1C in the last 72 hours. CBG: Recent Labs  Lab 04/29/18 1636 04/29/18 2014 04/30/18 0022 04/30/18 0401 04/30/18 0743  GLUCAP 98 135* 121* 126* 119*   Lipid Profile: No results for input(s): CHOL, HDL, LDLCALC, TRIG, CHOLHDL, LDLDIRECT in the last 72 hours. Thyroid Function Tests: No results for input(s): TSH, T4TOTAL, FREET4, T3FREE, THYROIDAB in the last 72 hours. Anemia Panel: No results for  input(s): VITAMINB12, FOLATE, FERRITIN, TIBC, IRON, RETICCTPCT in the last 72 hours. Urine analysis:    Component Value Date/Time   COLORURINE YELLOW 04/17/2018 0656   APPEARANCEUR HAZY (A) 04/17/2018 0656   LABSPEC 1.032 (H) 04/17/2018 0656   PHURINE 5.0 04/17/2018 0656   GLUCOSEU NEGATIVE 04/17/2018 0656   HGBUR SMALL (A) 04/17/2018 0656   BILIRUBINUR NEGATIVE 04/17/2018 0656   BILIRUBINUR neg 04/02/2015 1432   KETONESUR 20 (A) 04/17/2018 0656   PROTEINUR 100 (A) 04/17/2018 0656   UROBILINOGEN 1.0 04/02/2015 1432   UROBILINOGEN 0.2 04/23/2014 1238   NITRITE NEGATIVE 04/17/2018 0656   LEUKOCYTESUR NEGATIVE 04/17/2018 0656   Sepsis Labs: (procalcitonin:4,lacticidven:4)  ) No results found  for this or any previous visit (from the past 240 hour(s)).    Studies: No results found.  Scheduled Meds: . atorvastatin  40 mg Per Tube q1800  . chlorhexidine  15 mL Mouth Rinse BID  . clopidogrel  75 mg Oral Daily  . enoxaparin (LOVENOX) injection  40 mg Subcutaneous Daily  . famotidine  20 mg Oral BID  . feeding supplement (ENSURE ENLIVE)  237 mL Oral BID BM  . insulin aspart  0-15 Units Subcutaneous Q4H  . mouth rinse  15 mL Mouth Rinse q12n4p  . polyethylene glycol  17 g Oral Daily    Continuous Infusions: . lactated ringers Stopped (04/19/18 0831)  . sodium chloride       LOS: 13 days     Darlin Drop, MD [facility] Pager [phone]  If 7PM-7AM, please contact night-coverage [email or web address] Password [facility] 04/30/2018, 12:27 PM

## 2018-04-30 NOTE — Progress Notes (Signed)
PT Cancellation Note  Patient Details Name: Matthew Meza MRN: [id number] DOB: November 24, 1941   Cancelled Treatment:    Reason Eval/Treat Not Completed: PT screened, no needs identified, will sign off Pt previously evaluated and determined to be at baseline function. Also returning back to SNF with hospice. Spoke with RN and with no change in status and does not need continued PT services. Will sign off. If needs change, please re-consult.   Gladys Damme, PT, DPT  Acute Rehabilitation Services  Pager: [phone]  [facility] 04/30/2018, 12:46 PM

## 2018-04-30 NOTE — Care Management Important Message (Signed)
Important Message  Patient Details  Name: Matthew Meza MRN: [id number] Date of Birth: 1941-08-12   Medicare Important Message Given:  Yes    [name] 04/30/2018, 12:52 PM

## 2018-04-30 NOTE — Social Work (Signed)
CSW continuing to follow, CSW has alerted SNF that pt is returning (likely tomorrow) with hospice services. SNF aware.  Doy Hutching, LCSWA [facility] Clinical Social Work [phone]

## 2018-04-30 NOTE — Progress Notes (Signed)
  Speech Language Pathology Treatment: Dysphagia  Patient Details Name: Matthew Meza MRN: [id number] DOB: 1941/07/15 Today's Date: 04/30/2018 Time: 4098-1191 SLP Time Calculation (min) (ACUTE ONLY): 16 min  Assessment / Plan / Recommendation Clinical Impression  Pt seen for follow-up for dysphagia; no family present. SLP reassessed pt with thin liquids, purees and regular solids. There are not consistent overt signs of aspiration, however given pt's history of dysphagia, weak and hoarse vocal quality, cannot r/o silent aspiration without instrumental testing, particularly given pt's history of Parkinson's disease. There is delayed coughing x1 after sip of thin liquids, after which pt declined further POs. I called pt's daughter via phone to confirm what is documented in palliative care notes and she reaffirms that they do not desire further testing of pt's swallowing and would not want pt to have a feeding tube. She states, "We just want him to be comfortable." We discussed potential for progressive decline in swallow function given pt's dementia, PD, and she verbalizes understanding of aspiration risks and desire for pt to eat and drink what he wants. Per review of pt's vitals, lung sounds have been clear and he has been afebrile. He remains at risk for aspiration. Recommend advancement to regular solids, thin liquids for comfort, with family accepting of aspiration risks. Education completed and no further skilled ST needs identified. SLP will s/o.    HPI HPI: Pt is a 77 y.o. male who was admitted on 04/17/2018 with abdominal pain with coffee ground emesis.  Imaging revealed volvulus and partial small bowel obstruction. Lactic acid and WBC were extremely elevated and small bowel ischemia was suspected.  Dr. Donell Beers performed an exploratory laproscopic exam that was unrevealing.  The patient's recovery has been slow and palliative care is involved with consideration for hospice. PMH includes  parkinson's disease, dementia, previous CVA with dense left hemiparesis, DM, GERD, and mdoerate-severe oropharyngeal dysphagia in April 2018 due to reduced oral transit, severe pharyngeal residue without awareness, penetration to the vocal folds, and weak cough. PEG was placed at that time but had been d/c by the time he was seen again in July 2018. Per family report at that time he was eating a regular diet and thin liquids. No overt signs of aspiration were noted clinically but MBS was recommended due to history and CXR concerning for PNA and/or aspiration. Pt declined MBS despite education.      SLP Plan  Continue with current plan of care       Recommendations  Diet recommendations: Regular;Thin liquid Liquids provided via: Cup Medication Administration: Whole meds with liquid Supervision: Staff to assist with self feeding Compensations: Minimize environmental distractions;Slow rate;Small sips/bites Postural Changes and/or Swallow Maneuvers: Seated upright 90 degrees                Oral Care Recommendations: Oral care QID Follow up Recommendations: Skilled Nursing facility;Other (comment)(hospice) SLP Visit Diagnosis: Dysphagia, oropharyngeal phase (R13.12) Plan: Continue with current plan of care       GO              Matthew Baton, MS, CCC-SLP Speech-Language Pathologist [phone]   Arlana Lindau 04/30/2018, 2:09 PM

## 2018-05-01 LAB — GLUCOSE, CAPILLARY
Glucose-Capillary: 127 mg/dL — ABNORMAL HIGH (ref 65–99)
Glucose-Capillary: 125 mg/dL — ABNORMAL HIGH (ref 65–99)

## 2018-05-01 MED ORDER — ENSURE ENLIVE PO LIQD: 237.0000 mL | Freq: Two times a day (BID) | ORAL | 12 refills | Status: AC

## 2018-05-01 MED ORDER — INSULIN ASPART 100 UNIT/ML ~~LOC~~ SOLN: 8.0000 [IU] | SUBCUTANEOUS | 11 refills | Status: DC

## 2018-05-01 MED ORDER — ASPIRIN 325 MG PO TABS: 325.0000 mg | ORAL_TABLET | Freq: Every day | ORAL | Status: AC

## 2018-05-01 MED ORDER — POLYETHYLENE GLYCOL 3350 17 G PO PACK
17.0000 g | PACK | Freq: Every day | ORAL | 0 refills | Status: AC
Start: 2018-05-01 — End: ?

## 2018-05-01 MED ORDER — FAMOTIDINE 20 MG PO TABS: 20.0000 mg | ORAL_TABLET | Freq: Two times a day (BID) | ORAL | Status: AC

## 2018-05-01 MED ORDER — ATORVASTATIN CALCIUM 40 MG PO TABS: 40.0000 mg | ORAL_TABLET | Freq: Every day | ORAL | Status: AC

## 2018-05-01 MED ORDER — AMANTADINE HCL 50 MG/5ML PO SYRP: 200.0000 mg | ORAL_SOLUTION | Freq: Two times a day (BID) | ORAL | 0 refills | Status: AC

## 2018-05-01 MED ORDER — CLOPIDOGREL BISULFATE 75 MG PO TABS: 75.0000 mg | ORAL_TABLET | Freq: Every day | ORAL | Status: AC

## 2018-05-01 MED ORDER — IPRATROPIUM-ALBUTEROL 0.5-2.5 (3) MG/3ML IN SOLN: 3.0000 mL | RESPIRATORY_TRACT | Status: AC | PRN

## 2018-05-01 NOTE — Progress Notes (Signed)
Nsg Discharge Note  Admit Date:  04/17/2018 Discharge date: 05/01/2018   [name] to be D/C'd Nursing Home per MD order.  AVS completed.  Copy for chart, and copy for patient signed, and dated. Patient/caregiver able to verbalize understanding.  Discharge Medication: Allergies as of 05/01/2018      Reactions   No Known Allergies       Medication List    STOP taking these medications   baclofen 10 mg/mL Susp Commonly known as:  LIORESAL   insulin aspart 100 UNIT/ML injection Commonly known as:  novoLOG   pseudoephedrine-guaifenesin 60-600 MG 12 hr tablet Commonly known as:  MUCINEX D     TAKE these medications   amantadine 50 MG/5ML solution Commonly known as:  SYMMETREL Take 20 mLs (200 mg total) by mouth 2 (two) times daily for 5 days. What changed:  how to take this   aspirin 325 MG tablet Take 1 tablet (325 mg total) by mouth daily. What changed:  how to take this   atorvastatin 40 MG tablet Commonly known as:  LIPITOR Take 1 tablet (40 mg total) by mouth daily at 6 PM. What changed:  how to take this   clopidogrel 75 MG tablet Commonly known as:  PLAVIX Take 1 tablet (75 mg total) by mouth daily. What changed:  how to take this   famotidine 20 MG tablet Commonly known as:  PEPCID Take 1 tablet (20 mg total) by mouth 2 (two) times daily. What changed:  how to take this   feeding supplement (ENSURE ENLIVE) Liqd Take 237 mLs by mouth 2 (two) times daily between meals.   ipratropium-albuterol 0.5-2.5 (3) MG/3ML Soln Commonly known as:  DUONEB Take 3 mLs by nebulization every 4 (four) hours as needed. What changed:  reasons to take this   polyethylene glycol packet Commonly known as:  MIRALAX / GLYCOLAX Take 17 g by mouth at bedtime.       Discharge Assessment: Vitals:   05/01/18 1200 05/01/18 1300  BP:    Pulse: (!) 101 (!) 104  Resp: 20 19  Temp: 98.2 F (36.8 C)   SpO2: 97% 97%   Skin clean, dry and intact without evidence of skin  break down, no evidence of skin tears noted. Left heal was soft, pink and blanching. IV catheter discontinued intact. Site without signs and symptoms of complications - no redness or edema noted at insertion site, patient denies c/o pain - only slight tenderness at site.  Abdominal dressing was CDI. D/c Instructions-Education: Discharge instructions discussed with Consulting civil engineer at [facility]. D/c education completed with patient including follow up instructions, medication list, d/c activities limitations if indicated, with other d/c instructions as indicated by MD - patient able to verbalize understanding, all questions fully answered.   D/C to [facility] via [facility].  Eddie Dibbles, RN 05/01/2018 3:09 PM

## 2018-05-01 NOTE — Discharge Summary (Addendum)
Discharge Summary  Matthew Meza JXB:[id number] DOB: [birth year]  PCP: No primary care provider on file.  Admit date: 04/17/2018 Discharge date: 05/01/2018  Time spent: 25 minutes  Recommendations for Outpatient Follow-up:  1. Follow-up with hospice at discharge  Discharge Diagnoses:  Active Hospital Problems   Diagnosis Date Noted  . Sepsis (HCC) 06/03/2017  . Partial intestinal obstruction (HCC)   . Severe sepsis (HCC)   . Dementia due to Parkinson's disease without behavioral disturbance (HCC) 04/17/2018  . Type 2 diabetes mellitus (HCC) 04/17/2018  . Nausea and vomiting 04/17/2018  . N&V (nausea and vomiting) 04/17/2018  . 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 Hyperlipidemia 04/12/2017  . 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 Essential hypertension     Resolved Hospital Problems  No resolved problems to display.    Discharge Condition: Stable  Diet recommendation: Resume previous diet  Diet recommendations per speech therapy on 04/30/2018:  Diet recommendations: Regular;Thin liquid Liquids provided via: Cup Medication Administration: Whole meds with liquid Supervision: Staff to assist with self feeding Compensations: Minimize environmental distractions;Slow rate;Small sips/bites Postural Changes and/or Swallow Maneuvers: Seated upright 90 degrees.   Vitals:   05/01/18 1000 05/01/18 1200  BP: 119/81   Pulse: (!) 110 (!) 101  Resp: (!) 23 20  Temp:    SpO2: 97% 97%    History of present illness:  77 year old with previous history of CVA post chronic left-sided deficit, Parkinson's disease, diabetes mellitus type 2-insulin-dependent, dementia, hyperlipidemia came to the hospital after having coffee-ground emesis and was noted to have portal vein gas. In the ED he appeared to be septic with elevated WBC and lactate. CT of the abdomen pelvis  showed portal vein gas but no other acute process. He was started on IV-, antibiotics and NG tube was placed. General surgery took the patient to the OR for expiratory laparotomy on 5/14. Since then patient has been slowly improving as WBC has trended down. Has tolerated diet and is having bowel movements.  05/01/2018: Patient seen and examined at his bedside.  No sign of distress. Per palliative care team patient's daughter is in agreement to comfort measures only.  No further PT or SLP needed.  On the day of discharge, patient was hemodynamically stable.  He will follow-up with hospice in long-term care.  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o. 25 mg 3 times daily  Concern for sepsis from intra-abdominal infection no clear source Completed IV Zosyn  Blood cultures x2- to date Afebrile in the past 48 hours  Hypophosphatemia  Phosphorus 1.8 Repleted with phosphorus with  IV K-Phos 40 mEq  Increase oral intake as tolerated  History of large right MCA infarct with petechial hemorrhage  Poor prognosis  Palliative care following 04/01/2017  MRI brain with no contrast Continue statin Okay to resume Plavix from general surgery standpoint  Goals of care Palliative care team following.  Highly appreciated. Patient's family has opted for hospice Patient will continue with hospice care at [facility] place.   Procedures:  POD #13 status post ex lap on 5/14 by Dr. Donell Beers  Consultations:  General surgery  Palliative care team  Discharge Exam: BP 119/81   Pulse (!) 101   Temp 99.1 F (37.3 C) (Oral)   Resp 20   Ht 6' (1.829 m)   Wt 73.2 kg (161 lb 6 oz)   SpO2 97%   BMI 21.89 kg/m  . General: 77 y.o. year-old male well developed well nourished in no acute distress.  Very hard of hearing. . Cardiovascular: Regular rate and rhythm with no rubs or gallops.  No thyromegaly or JVD noted.   Marland Kitchen Respiratory: Clear to auscultation with no wheezes or rales. Good inspiratory effort. . Abdomen: Soft nontender nondistended with normal bowel sounds x4 quadrants.  Steri-Strips noted on abdomen from recent surgery. . Musculoskeletal: Trace edema in lower extremities bilaterally. Marland Kitchen Psychiatry: Mood is appropriate for condition and setting  Discharge Instructions You were cared for by a hospitalist during your hospital stay. If you have any questions about your discharge medications or the care you received while you were in the hospital after you are discharged, you can call the unit and asked to speak with the hospitalist on call if the hospitalist that took care of you is not available. Once you are discharged, your primary care physician will handle any further medical issues. Please note that NO REFILLS for any discharge medications will be authorized once you are discharged, as it is imperative that you return to your primary care physician (or establish a relationship with a primary care physician if you do not have one) for your aftercare needs so that they can reassess your need for medications and monitor your lab  values.   Allergies as of 05/01/2018      Reactions   No Known Allergies       Medication List    STOP taking these medications   baclofen 10 mg/mL Susp Commonly known as:  LIORESAL   insulin aspart 100 UNIT/ML injection Commonly known as:  novoLOG   pseudoephedrine-guaifenesin 60-600 MG 12 hr tablet Commonly known as:  MUCINEX D     TAKE these medications   amantadine 50 MG/5ML solution Commonly known as:  SYMMETREL Take 20 mLs (200 mg total) by mouth 2 (two) times daily for 5 days. What changed:  how to take this   aspirin 325 MG tablet Take 1 tablet (325 mg total) by mouth daily. What changed:  how to take this   atorvastatin 40 MG tablet Commonly known as:  LIPITOR Take 1 tablet (40 mg total) by mouth daily at 6 PM. What changed:  how to take this   clopidogrel 75 MG tablet Commonly known as:  PLAVIX Take 1 tablet (75 mg total) by mouth daily. What changed:  how to take this   famotidine 20 MG tablet Commonly known as:  PEPCID Take 1 tablet (20 mg total) by mouth 2 (two) times daily. What changed:  how to take this   feeding supplement (ENSURE ENLIVE) Liqd Take 237 mLs by mouth 2 (two) times  daily between meals.   ipratropium-albuterol 0.5-2.5 (3) MG/3ML Soln Commonly known as:  DUONEB Take 3 mLs by nebulization every 4 (four) hours as needed. What changed:  reasons to take this   polyethylene glycol packet Commonly known as:  MIRALAX / GLYCOLAX Take 17 g by mouth at bedtime.      Allergies  Allergen Reactions  . No Known Allergies     Contact information for follow-up providers    Almond Lint, MD Follow up.   Specialty:  General Surgery Why:  call for follow up as needed. Contact information: [address] [place] [address] [phone]            Contact information for after-discharge care    Destination    [facility] .   Service:  Skilled Nursing Contact information: [address] [place]  [place] [address] [phone]                   The results of significant diagnostics from this hospitalization (including imaging, microbiology, ancillary and laboratory) are listed below for reference.    Significant Diagnostic Studies: Dg Abdomen 1 View  Result Date: 04/17/2018 CLINICAL DATA:  Nasogastric tube placement EXAM: ABDOMEN - 1 VIEW COMPARISON:  CT abdomen and pelvis Apr 17, 2018 FINDINGS: Nasogastric tube tip and side port are in the stomach. There is mild gastric dilatation with air as well as dilatation of multiple loops of small bowel, similar to CT performed earlier in the day. No free air. Lung bases clear. Portal venous air seen on recent CT is not appreciable by radiography. IMPRESSION: Nasogastric tube tip and side port in stomach. Dilated bowel remains. A degree of bowel obstruction must be of concern. No free air evident. Lung bases clear. Electronically Signed   By: Bretta Bang III M.D.   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 Cecum is in normal position in the right lower quadrant. Swirling pattern of small bowel mesentery is seen in the right abdomen, consistent with volvulus and partial small bowel obstruction. Mild wall thickening of dilated small bowel loops is seen as well as possible mild pneumatosis (in addition to portal venous gas), suspicious for small bowel ischemia. These findings were discussed with Dr. Donell Beers at the time of this addendum on 04/17/2018 at 1215 hours. Electronically Signed   By: Myles Rosenthal M.D.   On: 04/17/2018 12:22   Result Date: 04/17/2018 CLINICAL DATA:  Acute onset of generalized abdominal pain, diarrhea and hematemesis. EXAM: CT ABDOMEN AND PELVIS WITH CONTRAST TECHNIQUE: Multidetector CT imaging of the abdomen and pelvis was  performed using the standard protocol following bolus administration of intravenous contrast. CONTRAST:  OMNIPAQUE IOHEXOL 300 MG/ML  SOLN COMPARISON:  CT of the abdomen and pelvis performed 04/23/2014, and abdominal radiograph performed earlier today at 5:48 a.m. FINDINGS: Lower chest: Minimal bibasilar atelectasis is noted. The visualized portions of the mediastinum are unremarkable. Hepatobiliary: Portal venous gas is noted within the liver, of uncertain etiology. The liver is otherwise unremarkable in appearance. The gallbladder is grossly unremarkable. The common bile duct is normal in caliber. Pancreas: The pancreas is within normal limits. Spleen: The spleen is unremarkable in appearance. Adrenals/Urinary Tract: The adrenal glands are unremarkable in appearance. 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 Left renal cysts are also noted. There is no evidence of hydronephrosis. No renal or ureteral stones are identified. Nonspecific perinephric stranding is noted bilaterally. Stomach/Bowel: The stomach is unremarkable in appearance. There is distention of small-bowel loops to 4.0 cm in maximal diameter, with gradual decompression at the right lower quadrant. No focal transition point is seen. This may simply reflect some degree of small bowel dysmotility. Slight wall thickening is suggested along the mid ileum, which could reflect a mild infectious or inflammatory process. There is no evidence of pneumatosis or bowel ischemia on provided images to explain the patient's portal venous gas. The patient is status post appendectomy. The colon is unremarkable in appearance. Vascular/Lymphatic: Scattered calcification is seen along the abdominal aorta and its branches. The abdominal aorta is otherwise grossly unremarkable. The inferior vena cava is grossly unremarkable. No retroperitoneal lymphadenopathy is seen. No pelvic sidewall lymphadenopathy is identified. Reproductive: The bladder  is mildly distended and grossly unremarkable. The prostate is enlarged, measuring 5.9 cm in transverse dimension, with heterogeneity. Other: No additional soft tissue abnormalities are seen. Musculoskeletal: No acute osseous abnormalities are identified. The visualized musculature is unremarkable in appearance. IMPRESSION: 1. Portal venous gas within the liver, of uncertain etiology. No evidence of pneumatosis or bowel ischemia on provided images. 2. Distention of small-bowel loops to 4.0 cm in maximal diameter, with gradual decompression at the right lower quadrant. This may simply reflect some degree of small bowel dysmotility; no evidence of bowel obstruction. 3. Slight wall thickening along the mid ileum could reflect a mild infectious or inflammatory process. 4. Enlarged prostate.  Would correlate with PSA. 5. 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 Aortic Atherosclerosis (ICD10-I70.0). These results were called by telephone at the time of interpretation on 04/17/2018 at 6:42 am to Dr. Ross Marcus , who verbally acknowledged these results. Electronically Signed: By: Roanna Raider M.D. On: 04/17/2018 06:46   Dg Chest Port 1 View  Result Date: 04/23/2018 CLINICAL DATA:  Central line placement. EXAM: PORTABLE CHEST 1 VIEW COMPARISON:  Chest radiograph Apr 17, 2018 FINDINGS: Slightly retracted LEFT internal jugular central venous catheter with distal tip projecting brachiocephalic confluence. No pneumothorax. Mildly elevated RIGHT hemidiaphragm with bibasilar strandy densities and small pleural effusions. Cardiomediastinal silhouette is normal. Soft tissue planes and included osseous structures are nonsuspicious. IMPRESSION: Slightly retracted LEFT IJ line distal tip projects a brachiocephalic confluence. Bibasilar atelectasis and small pleural effusions. Electronically Signed   By: Awilda Metro M.D.   On: 04/23/2018 17:55   Dg Chest Port 1 View  Result Date: 04/17/2018 CLINICAL  DATA:  Central venous catheter EXAM: PORTABLE CHEST 1 VIEW COMPARISON:  04/17/2018, 06/03/2017 FINDINGS: Left-sided central venous catheter tip overlies the brachiocephalic confluence. No pneumothorax. Low lung volumes. Atelectasis versus minimal infiltrate at the right base. Probable tiny effusions. Cardiomediastinal silhouette within normal limits. No pneumothorax. Esophageal tube tip below the diaphragm but not included on the image. IMPRESSION: 1. Left-sided central venous catheter tip overlies the brachiocephalic confluence. No pneumothorax. 2. Low lung volumes with probable tiny effusions. Atelectasis versus minimal infiltrate at the right base Electronically Signed   By: Jasmine Pang M.D.   On: 04/17/2018 20:02   Dg Abdomen Acute W/chest  Result Date: 04/17/2018 CLINICAL DATA:  Acute onset of vomiting. EXAM: DG ABDOMEN ACUTE W/ 1V CHEST COMPARISON:  Chest radiograph performed 06/03/2017, and lumbar spine radiographs performed 06/02/2013 FINDINGS: The lungs are hypoexpanded but appear grossly clear. There is no evidence of focal opacification, pleural effusion or  pneumothorax. The cardiomediastinal silhouette is within normal limits. There is distention of small-bowel loops to 4.5 cm in maximal diameter, concerning for some degree of small bowel obstruction. The colon is not well assessed. The stomach is largely filled with air and fluid. No free intra-abdominal air is identified on the provided upright view. No acute osseous abnormalities are seen; the sacroiliac joints are unremarkable in appearance. IMPRESSION: 1. Dilatation of small-bowel loops to 4.5 cm in maximal diameter, concerning for some degree of small bowel obstruction. No free intra-abdominal air seen. 2. Lungs hypoexpanded but grossly clear. These results were called by telephone at the time of interpretation on 04/17/2018 at 6:20 am to Dr. Ross Marcus, who verbally acknowledged these results. Electronically Signed   By: Roanna Raider  M.D.   On: 04/17/2018 06:21   Dg Abd Portable 1v  Result Date: 04/22/2018 CLINICAL DATA:  Nausea and vomiting EXAM: PORTABLE ABDOMEN - 1 VIEW COMPARISON:  Radiograph 04/18/2018, CT 04/17/2018 FINDINGS: NG tube not on the film. Newly dilates of small bowel centrally within the abdomen measure up to 4-5 cm. There is gas in the rectum. IMPRESSION: Newly dilated loops of small bowel consistent ileus or obstruction. NG tube appears to have been removed. Electronically Signed   By: Genevive Bi M.D.   On: 04/22/2018 09:34   Dg Abd Portable 1v  Result Date: 04/18/2018 CLINICAL DATA:  Nasogastric tube placement EXAM: PORTABLE ABDOMEN - 1 VIEW COMPARISON:  Plain film of the abdomen dated 04/17/2018. FINDINGS: NG tube is well positioned in the stomach. Stomach has been decompressed. The mildly distended gas-filled small bowel loops within the lower abdomen and RIGHT lower quadrant appears stable, perhaps slightly improved. IMPRESSION: Nasogastric tube is adequately positioned in the stomach. Electronically Signed   By: Bary Richard M.D.   On: 04/18/2018 23:09   US Abdomen Limited Ruq  Result Date: 04/17/2018 CLINICAL DATA:  77 year old male with abnormal LFTs. Portal venous gas suspected within the liver on CT today. EXAM: ULTRASOUND ABDOMEN LIMITED RIGHT UPPER QUADRANT COMPARISON:  CT Abdomen and Pelvis 0618 hours today. FINDINGS: Gallbladder: No gallstones or wall thickening visualized. No sonographic Murphy sign noted by sonographer. Common bile duct: Diameter: 3 millimeters, within normal limits (images 11 and 12). Liver: Heterogeneous echotexture with shadowing echogenic foci in the anterior left hepatic lobe likely corresponding to the abnormal gas seen by CT. Background liver echogenicity may be abnormally decreased, uncertain. Portal vein is patent on color Doppler imaging with normal direction of blood flow towards the liver. Other findings: No free fluid identified. Grossly negative visible right  kidney. IMPRESSION: 1. The gallbladder and CBD appear normal by ultrasound. 2. Abnormal heterogeneous appearance of the liver in part related to the parenchymal portal venous gas seen by CT earlier today. Possible but uncertain underlying hepatic edema, such as due to a hepatitis. Electronically Signed   By: Odessa Fleming M.D.   On: 04/17/2018 09:42    Microbiology: No results found for this or any previous visit (from the past 240 hour(s)).   Labs: Basic Metabolic Panel: Recent Labs  Lab 04/25/18 0448 04/26/18 0437 04/27/18 0559 04/28/18 0349  NA 140 142 140 140  K 3.7 3.5 3.6 3.7  CL 108 106 105 109  CO2 26 28 25 24   GLUCOSE 127* 107* 133* 146*  BUN 6 <5* <5* <5*  CREATININE 0.84 0.88 0.84 0.85  CALCIUM 8.1* 8.4* 8.8* 8.8*  PHOS 1.8*  --   --   --    Liver Function  Tests: Recent Labs  Lab 04/27/18 0559  AST 30  ALT 60  ALKPHOS 72  BILITOT 1.0  PROT 6.0*  ALBUMIN 2.9*   No results for input(s): LIPASE, AMYLASE in the last 168 hours. No results for input(s): AMMONIA in the last 168 hours. CBC: Recent Labs  Lab 04/25/18 0448 04/26/18 0437 04/27/18 0559 04/28/18 0349  WBC 10.2 8.1 8.5 9.6  HGB 11.7* 11.5* 12.9* 12.6*  HCT 35.0* 34.8* 39.1 38.4*  MCV 92.6 93.0 92.9 92.8  PLT 219 253 289 301   Cardiac Enzymes: No results for input(s): CKTOTAL, CKMB, CKMBINDEX, TROPONINI in the last 168 hours. BNP: BNP (last 3 results) Recent Labs    06/03/17 1245  BNP 58.9    ProBNP (last 3 results) No results for input(s): PROBNP in the last 8760 hours.  CBG: Recent Labs  Lab 04/30/18 1224 04/30/18 1611 04/30/18 2007 05/01/18 0757 05/01/18 1210  GLUCAP 129* 111* 116* 127* 125*       Signed:  Darlin Drop, MD [facility] 05/01/2018, 1:01 PM

## 2018-05-01 NOTE — Progress Notes (Signed)
Gave report to Consulting civil engineer at [employer].

## 2018-05-01 NOTE — Discharge Instructions (Signed)
[facility]      [facility], [place] [phone]  OPEN ABDOMINAL SURGERY: POST OP INSTRUCTIONS I would keep him on full liquids another day or so and be sure GI tract is working well then go to soft.   Staples out and Steri-strips in place.  You can wash with soap and water, remove steri strips in 7 days if they do not come off with washing.    Always review your discharge instruction sheet given to you by the facility where your surgery was performed.  IF YOU HAVE DISABILITY OR FAMILY LEAVE FORMS, YOU MUST BRING THEM TO THE OFFICE FOR PROCESSING.  PLEASE DO NOT GIVE THEM TO YOUR DOCTOR.  1. A prescription for pain medication may be given to you upon discharge.  Take your pain medication as prescribed, if needed.  If narcotic pain medicine is not needed, then you may take acetaminophen (Tylenol) or ibuprofen (Advil) as needed. 2. Take your usually prescribed medications unless otherwise directed. 3. If you need a refill on your pain medication, please contact your pharmacy. They will contact our office to request authorization.  Prescriptions will not be filled after 5pm or on week-ends. 4. You should follow a light diet the first few days after arrival home, such as soup and crackers, pudding, etc.unless your doctor has advised otherwise. A high-fiber, low fat diet can be resumed as tolerated.   Be sure to include lots of fluids daily. Most patients will experience some swelling and bruising on the chest and neck area.  Ice packs will help.  Swelling and bruising can take several days to resolve 5. Most patients will experience some swelling and bruising in the area of the incision. Ice pack will help. Swelling and bruising can take several days to resolve..  6. It is common to experience some constipation if taking pain medication after surgery.  Increasing fluid intake and taking a stool softener will usually help or prevent this problem from occurring.  A mild laxative (Milk of Magnesia or  Miralax) should be taken according to package directions if there are no bowel movements after 48 hours. 7.  You may have steri-strips (small skin tapes) in place directly over the incision.  These strips should be left on the skin for 7-10 days.  If your surgeon used skin glue on the incision, you may shower in 24 hours.  The glue will flake off over the next 2-3 weeks.  Any sutures or staples will be removed at the office during your follow-up visit. You may find that a light gauze bandage over your incision may keep your staples from being rubbed or pulled. You may shower and replace the bandage daily. 8. ACTIVITIES:  You may resume regular (light) daily activities beginning the next day--such as daily self-care, walking, climbing stairs--gradually increasing activities as tolerated.  You may have sexual intercourse when it is comfortable.  Refrain from any heavy lifting or straining until approved by your doctor. a. You may drive when you no longer are taking prescription pain medication, you can comfortably wear a seatbelt, and you can safely maneuver your car and apply brakes b. Return to Work: ___________________________________ 9. You should see your doctor in the office for a follow-up appointment approximately two weeks after your surgery.  Make sure that you call for this appointment within a day or two after you arrive home to insure a convenient appointment time. OTHER INSTRUCTIONS:  _____________________________________________________________ _____________________________________________________________  WHEN TO CALL YOUR DOCTOR: 1. Fever over 101.0  2. Inability to urinate 3. Nausea and/or vomiting 4. Extreme swelling or bruising 5. Continued bleeding from incision. 6. Increased pain, redness, or drainage from the incision. 7. Difficulty swallowing or breathing 8. Muscle cramping or spasms. 9. Numbness or tingling in hands or feet or around lips.  The clinic staff is available to  answer your questions during regular business hours.  Please dont hesitate to call and ask to speak to one of the nurses if you have concerns.  For further questions, please visit [email or web address]

## 2018-05-01 NOTE — Progress Notes (Signed)
No charge note.    In Palliative Medicine meeting Federico Flake (Daughter) was in agreement to comfort measures only.    We completed a MOST form.  This form will go into effect when he  discharges to [facility]. 1.  DNR - comfort measures only.  Avoid rehospitalization 2.  Oral antibiotics if needed (as a comfort measure) 3.  No IV fluids 4.  No PEG or artificial feeding  Her intention is to have him well cared for at [facility] with the additional support of Hospice in long term care.    No further PT or SLP is needed.  Norvel Richards, PA-C Palliative Medicine Pager: [phone]

## 2018-05-01 NOTE — Progress Notes (Signed)
Patient will DC to: [facility]Hospice Anticipated DC date: 05/01/18 Family notified: Daughter, Community education officer by: Dayna Barker   Per MD patient ready for DC to [facility]. RN, patient, patient's family, and facility notified of DC. Discharge Summary sent to facility. RN given number for report [phone] Room 506B). DC packet on chart. Ambulance transport requested for patient.   CSW signing off.  Cristobal Goldmann, LCSW Clinical Social Worker [phone]

## 2018-05-31 DIAGNOSIS — Z515 Encounter for palliative care: Secondary | ICD-10-CM

## 2019-04-04 IMAGING — DX DG CHEST 1V PORT
1 series · 1 of 1 positions shown · non-contrast
Comparison: Chest radiograph April 17, 2018

CLINICAL DATA: Central line placement.

EXAM:
PORTABLE CHEST 1 VIEW

[chest ap]
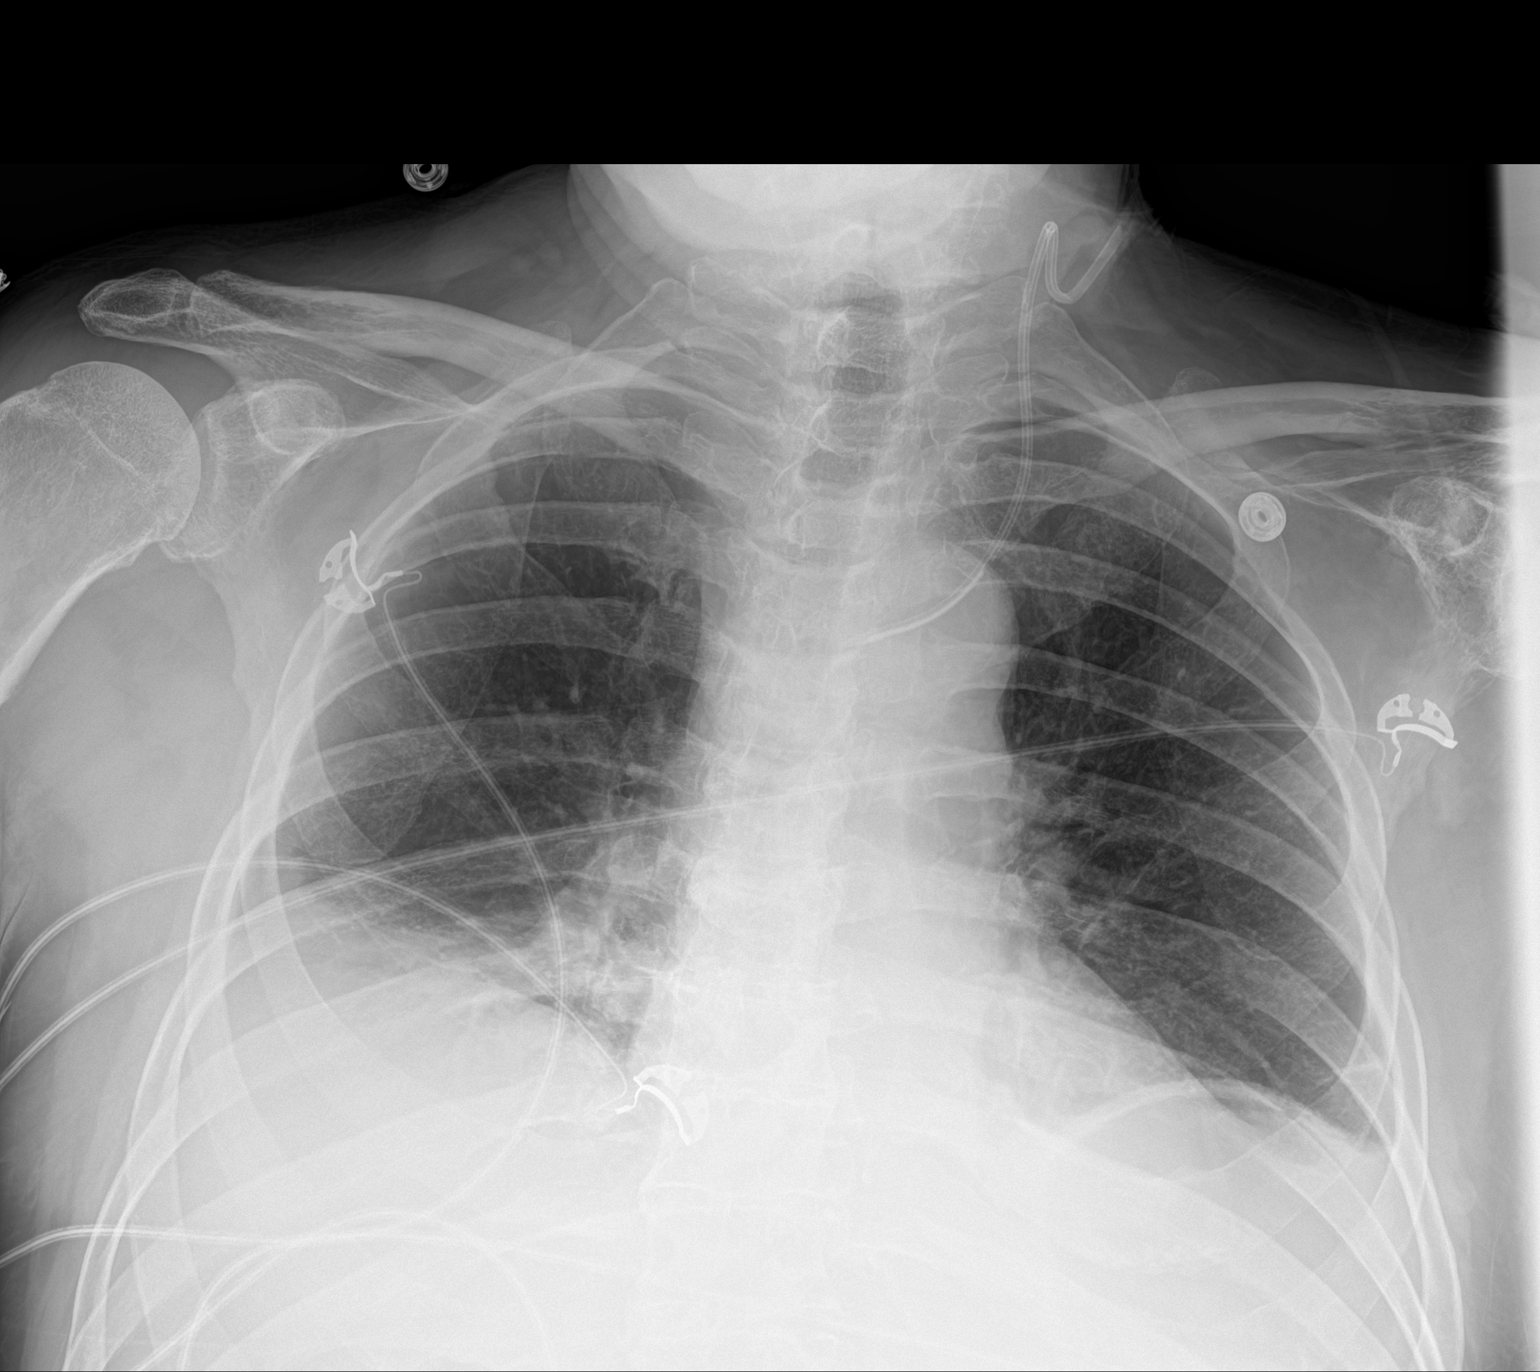

[1 of 1 positions shown; findings below may reference images not displayed]

FINDINGS: Slightly retracted LEFT internal jugular central venous catheter
with distal tip projecting brachiocephalic confluence. No
pneumothorax. Mildly elevated RIGHT hemidiaphragm with bibasilar
strandy densities and small pleural effusions. Cardiomediastinal
silhouette is normal. Soft tissue planes and included osseous
structures are nonsuspicious.
IMPRESSION: Slightly retracted LEFT IJ line distal tip projects a
brachiocephalic confluence.

Bibasilar atelectasis and small pleural effusions.

## 2019-05-06 DEATH — deceased
